# Patient Record
Sex: Female | Born: 1985 | Race: White | Hispanic: No | Marital: Married | Smoking: Former smoker
Health system: Southern US, Community
[De-identification: ages and names within clinical notes are randomized; demographics above are authoritative.]

## PROBLEM LIST (undated history)

## (undated) DIAGNOSIS — Z803 Family history of malignant neoplasm of breast: Secondary | ICD-10-CM

## (undated) DIAGNOSIS — G620 Drug-induced polyneuropathy: Secondary | ICD-10-CM

## (undated) DIAGNOSIS — F909 Attention-deficit hyperactivity disorder, unspecified type: Secondary | ICD-10-CM

## (undated) DIAGNOSIS — H04222 Epiphora due to insufficient drainage, left lacrimal gland: Secondary | ICD-10-CM

## (undated) DIAGNOSIS — R002 Palpitations: Secondary | ICD-10-CM

## (undated) DIAGNOSIS — Z1371 Encounter for nonprocreative screening for genetic disease carrier status: Secondary | ICD-10-CM

## (undated) DIAGNOSIS — H04552 Acquired stenosis of left nasolacrimal duct: Secondary | ICD-10-CM

## (undated) DIAGNOSIS — R112 Nausea with vomiting, unspecified: Secondary | ICD-10-CM

## (undated) DIAGNOSIS — L900 Lichen sclerosus et atrophicus: Secondary | ICD-10-CM

## (undated) DIAGNOSIS — Z9889 Other specified postprocedural states: Secondary | ICD-10-CM

## (undated) DIAGNOSIS — I493 Ventricular premature depolarization: Secondary | ICD-10-CM

## (undated) DIAGNOSIS — R87619 Unspecified abnormal cytological findings in specimens from cervix uteri: Secondary | ICD-10-CM

## (undated) DIAGNOSIS — T451X5A Adverse effect of antineoplastic and immunosuppressive drugs, initial encounter: Secondary | ICD-10-CM

## (undated) DIAGNOSIS — D6481 Anemia due to antineoplastic chemotherapy: Secondary | ICD-10-CM

## (undated) DIAGNOSIS — F419 Anxiety disorder, unspecified: Secondary | ICD-10-CM

## (undated) DIAGNOSIS — Z8041 Family history of malignant neoplasm of ovary: Secondary | ICD-10-CM

## (undated) DIAGNOSIS — K521 Toxic gastroenteritis and colitis: Secondary | ICD-10-CM

## (undated) DIAGNOSIS — M7989 Other specified soft tissue disorders: Secondary | ICD-10-CM

## (undated) DIAGNOSIS — R0600 Dyspnea, unspecified: Secondary | ICD-10-CM

## (undated) DIAGNOSIS — E782 Mixed hyperlipidemia: Secondary | ICD-10-CM

## (undated) DIAGNOSIS — I491 Atrial premature depolarization: Secondary | ICD-10-CM

## (undated) DIAGNOSIS — R000 Tachycardia, unspecified: Secondary | ICD-10-CM

## (undated) DIAGNOSIS — D72829 Elevated white blood cell count, unspecified: Secondary | ICD-10-CM

## (undated) DIAGNOSIS — A6000 Herpesviral infection of urogenital system, unspecified: Secondary | ICD-10-CM

## (undated) HISTORY — DX: Family history of malignant neoplasm of breast: Z80.3

## (undated) HISTORY — DX: Unspecified abnormal cytological findings in specimens from cervix uteri: R87.619

## (undated) HISTORY — DX: Encounter for nonprocreative screening for genetic disease carrier status: Z13.71

## (undated) HISTORY — PX: COLPOSCOPY: SHX161

## (undated) HISTORY — DX: Herpesviral infection of urogenital system, unspecified: A60.00

## (undated) HISTORY — PX: IUD REMOVAL: SHX5392

## (undated) HISTORY — DX: Family history of malignant neoplasm of ovary: Z80.41

## (undated) MED FILL — Dexamethasone Sodium Phosphate Inj 100 MG/10ML: INTRAMUSCULAR | Qty: 1 | Status: AC

---

## 2005-03-21 ENCOUNTER — Inpatient Hospital Stay (HOSPITAL_COMMUNITY): Admission: AD | Admit: 2005-03-21 | Discharge: 2005-03-21 | Payer: Self-pay | Admitting: Obstetrics & Gynecology

## 2006-12-26 ENCOUNTER — Observation Stay: Payer: Self-pay | Admitting: Obstetrics & Gynecology

## 2007-01-02 ENCOUNTER — Inpatient Hospital Stay: Payer: Self-pay

## 2007-08-29 ENCOUNTER — Emergency Department: Payer: Self-pay | Admitting: Emergency Medicine

## 2009-08-11 ENCOUNTER — Emergency Department: Payer: Self-pay | Admitting: Emergency Medicine

## 2011-04-20 ENCOUNTER — Ambulatory Visit: Payer: Self-pay | Admitting: Internal Medicine

## 2011-10-11 ENCOUNTER — Ambulatory Visit: Payer: Self-pay | Admitting: Rheumatology

## 2013-03-02 ENCOUNTER — Ambulatory Visit: Payer: Self-pay | Admitting: Internal Medicine

## 2013-12-13 DIAGNOSIS — Z1371 Encounter for nonprocreative screening for genetic disease carrier status: Secondary | ICD-10-CM

## 2013-12-13 DIAGNOSIS — Z803 Family history of malignant neoplasm of breast: Secondary | ICD-10-CM

## 2013-12-13 HISTORY — DX: Encounter for nonprocreative screening for genetic disease carrier status: Z13.71

## 2013-12-13 HISTORY — DX: Family history of malignant neoplasm of breast: Z80.3

## 2014-03-01 ENCOUNTER — Ambulatory Visit: Payer: Self-pay | Admitting: General Practice

## 2015-08-21 LAB — OB RESULTS CONSOLE RPR: RPR: NONREACTIVE

## 2015-08-21 LAB — OB RESULTS CONSOLE RUBELLA ANTIBODY, IGM: Rubella: IMMUNE

## 2015-08-21 LAB — OB RESULTS CONSOLE VARICELLA ZOSTER ANTIBODY, IGG: VARICELLA IGG: IMMUNE

## 2015-08-21 LAB — OB RESULTS CONSOLE HIV ANTIBODY (ROUTINE TESTING): HIV: NONREACTIVE

## 2015-08-21 LAB — OB RESULTS CONSOLE HEPATITIS B SURFACE ANTIGEN: Hepatitis B Surface Ag: NEGATIVE

## 2016-03-12 LAB — OB RESULTS CONSOLE GBS: STREP GROUP B AG: NEGATIVE

## 2016-03-25 ENCOUNTER — Encounter
Admission: RE | Admit: 2016-03-25 | Discharge: 2016-03-25 | Disposition: A | Discharge status: Home / Self Care | Payer: Managed Care, Other (non HMO) | Source: Ambulatory Visit | Attending: Obstetrics and Gynecology | Admitting: Obstetrics and Gynecology

## 2016-03-25 HISTORY — DX: Other specified postprocedural states: R11.2

## 2016-03-25 HISTORY — DX: Other specified postprocedural states: Z98.890

## 2016-03-25 LAB — TYPE AND SCREEN
ABO/RH(D): O POS
Antibody Screen: NEGATIVE
EXTEND SAMPLE REASON: UNDETERMINED

## 2016-03-25 LAB — CBC
HEMATOCRIT: 33 % — AB (ref 35.0–47.0)
Hemoglobin: 11.3 g/dL — ABNORMAL LOW (ref 12.0–16.0)
MCH: 27.5 pg (ref 26.0–34.0)
MCHC: 34.2 g/dL (ref 32.0–36.0)
MCV: 80.3 fL (ref 80.0–100.0)
Platelets: 251 10*3/uL (ref 150–440)
RBC: 4.12 MIL/uL (ref 3.80–5.20)
RDW: 14.7 % — AB (ref 11.5–14.5)
WBC: 11 10*3/uL (ref 3.6–11.0)

## 2016-03-25 LAB — RAPID HIV SCREEN (HIV 1/2 AB+AG)
HIV 1/2 Antibodies: NONREACTIVE
HIV-1 P24 Antigen - HIV24: NONREACTIVE

## 2016-03-25 LAB — ABO/RH: ABO/RH(D): O POS

## 2016-03-25 NOTE — Patient Instructions (Signed)
  Your procedure is scheduled on: Friday 03/26/16 Report to [facility] ([place] AT 5:30)   Remember: Instructions that are not followed completely may result in serious medical risk, up to and including death, or upon the discretion of your surgeon and anesthesiologist your surgery may need to be rescheduled.    __X__ 1. Do not eat food or drink liquids after midnight. No gum chewing or hard candies.     __X__ 2. No Alcohol for 24 hours before or after surgery.   ____ 3. Bring all medications with you on the day of surgery if instructed.    __X__ 4. Notify your doctor if there is any change in your medical condition     (cold, fever, infections).     Do not wear jewelry, make-up, hairpins, clips or nail polish.  Do not wear lotions, powders, or perfumes. You may wear deodorant.  Do not shave 48 hours prior to surgery. Men may shave face and neck.  Do not bring valuables to the hospital.    [facility] is not responsible for any belongings or valuables.               Contacts, dentures or bridgework may not be worn into surgery.  Leave your suitcase in the car. After surgery it may be brought to your room.  For patients admitted to the hospital, discharge time is determined by your                treatment team.   Patients discharged the day of surgery will not be allowed to drive home.   Please read over the following fact sheets that you were given:   Surgical Site Infection Prevention   ____ Take these medicines the morning of surgery with A SIP OF WATER:    1. NONE  2.   3.   4.  5.  6.  ____ Fleet Enema (as directed)   __X__ Use CHG Soap as directed  ____ Use inhalers on the day of surgery  ____ Stop metformin 2 days prior to surgery    ____ Take 1/2 of usual insulin dose the night before surgery and none on the morning of surgery.   ____ Stop Coumadin/Plavix/aspirin on   ____ Stop Anti-inflammatories on    ____ Stop supplements until  after surgery.    ____ Bring C-Pap to the hospital.

## 2016-03-26 ENCOUNTER — Inpatient Hospital Stay: Admit: 2016-03-26 | Payer: Self-pay | Admitting: Obstetrics and Gynecology

## 2016-03-26 ENCOUNTER — Encounter
Admission: RE | Disposition: A | Discharge status: Home / Self Care | Payer: Self-pay | Source: Ambulatory Visit | Attending: Obstetrics and Gynecology

## 2016-03-26 ENCOUNTER — Inpatient Hospital Stay: Payer: Managed Care, Other (non HMO) | Admitting: Anesthesiology

## 2016-03-26 ENCOUNTER — Encounter: Payer: Self-pay | Admitting: *Deleted

## 2016-03-26 ENCOUNTER — Inpatient Hospital Stay
Admission: RE | Admit: 2016-03-26 | Discharge: 2016-03-28 | DRG: 766 | Disposition: A | Discharge status: Home / Self Care | Payer: Managed Care, Other (non HMO) | Source: Ambulatory Visit | Attending: Obstetrics and Gynecology | Admitting: Obstetrics and Gynecology

## 2016-03-26 DIAGNOSIS — O9081 Anemia of the puerperium: Secondary | ICD-10-CM | POA: Diagnosis not present

## 2016-03-26 DIAGNOSIS — O34211 Maternal care for low transverse scar from previous cesarean delivery: Principal | ICD-10-CM | POA: Diagnosis present

## 2016-03-26 DIAGNOSIS — D500 Iron deficiency anemia secondary to blood loss (chronic): Secondary | ICD-10-CM | POA: Diagnosis not present

## 2016-03-26 DIAGNOSIS — Z3A39 39 weeks gestation of pregnancy: Secondary | ICD-10-CM

## 2016-03-26 DIAGNOSIS — Z302 Encounter for sterilization: Secondary | ICD-10-CM

## 2016-03-26 DIAGNOSIS — Z98891 History of uterine scar from previous surgery: Secondary | ICD-10-CM

## 2016-03-26 LAB — RPR: RPR Ser Ql: NONREACTIVE

## 2016-03-26 SURGERY — Surgical Case
Anesthesia: Choice

## 2016-03-26 SURGERY — Surgical Case
Anesthesia: Spinal

## 2016-03-26 MED ORDER — OXYCODONE HCL 5 MG PO TABS: 5.0000 mg | ORAL_TABLET | Freq: Once | ORAL | Status: DC | PRN

## 2016-03-26 MED ORDER — ACETAMINOPHEN 500 MG PO TABS
1000.0000 mg | ORAL_TABLET | Freq: Four times a day (QID) | ORAL | Status: AC
  Administered 2016-03-26 – 2016-03-27 (×4): 1000 mg via ORAL
  Filled 2016-03-26 (×5): qty 2

## 2016-03-26 MED ORDER — DIBUCAINE 1 % RE OINT: 1.0000 "application " | TOPICAL_OINTMENT | RECTAL | Status: DC | PRN

## 2016-03-26 MED ORDER — FERROUS SULFATE 325 (65 FE) MG PO TABS
325.0000 mg | ORAL_TABLET | Freq: Two times a day (BID) | ORAL | Status: DC
  Administered 2016-03-26 – 2016-03-27 (×3): 325 mg via ORAL
  Filled 2016-03-26 (×3): qty 1

## 2016-03-26 MED ORDER — ONDANSETRON HCL 4 MG/2ML IJ SOLN
INTRAMUSCULAR | Status: DC | PRN
  Administered 2016-03-26: 4 mg via INTRAVENOUS

## 2016-03-26 MED ORDER — CEFAZOLIN SODIUM-DEXTROSE 2-4 GM/100ML-% IV SOLN
2.0000 g | INTRAVENOUS | Status: AC
Start: 2016-03-26 — End: 2016-03-26
  Administered 2016-03-26: 2 g via INTRAVENOUS
  Filled 2016-03-26: qty 100

## 2016-03-26 MED ORDER — OXYCODONE-ACETAMINOPHEN 5-325 MG PO TABS
2.0000 | ORAL_TABLET | ORAL | Status: DC | PRN
  Filled 2016-03-26: qty 2

## 2016-03-26 MED ORDER — PRENATAL MULTIVITAMIN CH
1.0000 | ORAL_TABLET | Freq: Every day | ORAL | Status: DC
  Administered 2016-03-26 – 2016-03-27 (×2): 1 via ORAL
  Filled 2016-03-26 (×2): qty 1

## 2016-03-26 MED ORDER — COCONUT OIL OIL
1.0000 "application " | TOPICAL_OIL | Status: DC | PRN
  Filled 2016-03-26: qty 120

## 2016-03-26 MED ORDER — WITCH HAZEL-GLYCERIN EX PADS: 1.0000 "application " | MEDICATED_PAD | CUTANEOUS | Status: DC | PRN

## 2016-03-26 MED ORDER — MENTHOL 3 MG MT LOZG
1.0000 | LOZENGE | OROMUCOSAL | Status: DC | PRN
  Filled 2016-03-26: qty 9

## 2016-03-26 MED ORDER — SODIUM CHLORIDE 0.9% FLUSH: 3.0000 mL | INTRAVENOUS | Status: DC | PRN

## 2016-03-26 MED ORDER — LACTATED RINGERS IV BOLUS (SEPSIS)
1000.0000 mL | Freq: Once | INTRAVENOUS | Status: AC
  Administered 2016-03-26: 1000 mL via INTRAVENOUS

## 2016-03-26 MED ORDER — DIPHENHYDRAMINE HCL 25 MG PO CAPS: 25.0000 mg | ORAL_CAPSULE | ORAL | Status: DC | PRN

## 2016-03-26 MED ORDER — MORPHINE SULFATE (PF) 0.5 MG/ML IJ SOLN
INTRAMUSCULAR | Status: DC | PRN
  Administered 2016-03-26: .1 mg via INTRATHECAL

## 2016-03-26 MED ORDER — IBUPROFEN 600 MG PO TABS
600.0000 mg | ORAL_TABLET | Freq: Four times a day (QID) | ORAL | Status: DC
  Administered 2016-03-27 – 2016-03-28 (×4): 600 mg via ORAL
  Filled 2016-03-26 (×5): qty 1

## 2016-03-26 MED ORDER — NALBUPHINE HCL 10 MG/ML IJ SOLN: 5.0000 mg | Freq: Once | INTRAMUSCULAR | Status: DC | PRN

## 2016-03-26 MED ORDER — OXYTOCIN 40 UNITS IN LACTATED RINGERS INFUSION - SIMPLE MED
INTRAVENOUS | Status: DC | PRN
  Administered 2016-03-26 (×2): 500 mL via INTRAVENOUS

## 2016-03-26 MED ORDER — DIPHENHYDRAMINE HCL 25 MG PO CAPS: 25.0000 mg | ORAL_CAPSULE | Freq: Four times a day (QID) | ORAL | Status: DC | PRN

## 2016-03-26 MED ORDER — DIPHENHYDRAMINE HCL 50 MG/ML IJ SOLN: 12.5000 mg | INTRAMUSCULAR | Status: DC | PRN

## 2016-03-26 MED ORDER — BUPIVACAINE HCL (PF) 0.5 % IJ SOLN: 5.0000 mL | Freq: Once | INTRAMUSCULAR | Status: DC

## 2016-03-26 MED ORDER — KETOROLAC TROMETHAMINE 30 MG/ML IJ SOLN: 30.0000 mg | Freq: Four times a day (QID) | INTRAMUSCULAR | Status: AC | PRN

## 2016-03-26 MED ORDER — CITRIC ACID-SODIUM CITRATE 334-500 MG/5ML PO SOLN
30.0000 mL | ORAL | Status: AC
  Administered 2016-03-26: 30 mL via ORAL
  Filled 2016-03-26: qty 30

## 2016-03-26 MED ORDER — ONDANSETRON HCL 4 MG/2ML IJ SOLN: 4.0000 mg | Freq: Three times a day (TID) | INTRAMUSCULAR | Status: DC | PRN

## 2016-03-26 MED ORDER — NALOXONE HCL 0.4 MG/ML IJ SOLN: 0.4000 mg | INTRAMUSCULAR | Status: DC | PRN

## 2016-03-26 MED ORDER — OXYCODONE HCL 5 MG/5ML PO SOLN: 5.0000 mg | Freq: Once | ORAL | Status: DC | PRN

## 2016-03-26 MED ORDER — BUPIVACAINE 0.25 % ON-Q PUMP DUAL CATH 400 ML
400.0000 mL | INJECTION | Status: DC
  Filled 2016-03-26: qty 400

## 2016-03-26 MED ORDER — BUPIVACAINE HCL (PF) 0.5 % IJ SOLN
5.0000 mL | Freq: Once | INTRAMUSCULAR | Status: DC
  Filled 2016-03-26: qty 30

## 2016-03-26 MED ORDER — LACTATED RINGERS IV SOLN
INTRAVENOUS | Status: DC
  Administered 2016-03-26 – 2016-03-27 (×2): via INTRAVENOUS

## 2016-03-26 MED ORDER — BUPIVACAINE HCL (PF) 0.5 % IJ SOLN
INTRAMUSCULAR | Status: DC | PRN
  Administered 2016-03-26: 10 mL

## 2016-03-26 MED ORDER — FENTANYL CITRATE (PF) 100 MCG/2ML IJ SOLN: 25.0000 ug | INTRAMUSCULAR | Status: DC | PRN

## 2016-03-26 MED ORDER — NALBUPHINE HCL 10 MG/ML IJ SOLN: 5.0000 mg | INTRAMUSCULAR | Status: DC | PRN

## 2016-03-26 MED ORDER — SENNOSIDES-DOCUSATE SODIUM 8.6-50 MG PO TABS
2.0000 | ORAL_TABLET | ORAL | Status: DC
  Administered 2016-03-27: 2 via ORAL
  Filled 2016-03-26: qty 2

## 2016-03-26 MED ORDER — LACTATED RINGERS IV SOLN
INTRAVENOUS | Status: DC
  Administered 2016-03-26 (×2): via INTRAVENOUS

## 2016-03-26 MED ORDER — BUPIVACAINE ON-Q PAIN PUMP (FOR ORDER SET NO CHG)
INJECTION | Status: DC
  Filled 2016-03-26: qty 1

## 2016-03-26 MED ORDER — KETOROLAC TROMETHAMINE 30 MG/ML IJ SOLN
30.0000 mg | Freq: Four times a day (QID) | INTRAMUSCULAR | Status: AC | PRN
  Administered 2016-03-26 – 2016-03-27 (×3): 30 mg via INTRAVENOUS
  Filled 2016-03-26 (×3): qty 1

## 2016-03-26 MED ORDER — OXYCODONE-ACETAMINOPHEN 5-325 MG PO TABS
1.0000 | ORAL_TABLET | ORAL | Status: DC | PRN
  Administered 2016-03-27 – 2016-03-28 (×2): 1 via ORAL
  Filled 2016-03-26: qty 1

## 2016-03-26 MED ORDER — NALOXONE HCL 2 MG/2ML IJ SOSY
1.0000 ug/kg/h | PREFILLED_SYRINGE | INTRAVENOUS | Status: DC | PRN
  Filled 2016-03-26: qty 2

## 2016-03-26 MED ORDER — FENTANYL CITRATE (PF) 100 MCG/2ML IJ SOLN
INTRAMUSCULAR | Status: DC | PRN
  Administered 2016-03-26: 15 ug via INTRATHECAL

## 2016-03-26 MED ORDER — OXYTOCIN 10 UNIT/ML IJ SOLN
2.5000 [IU]/h | INTRAMUSCULAR | Status: AC
  Filled 2016-03-26: qty 4

## 2016-03-26 MED ORDER — SIMETHICONE 80 MG PO CHEW
80.0000 mg | CHEWABLE_TABLET | Freq: Three times a day (TID) | ORAL | Status: DC
  Administered 2016-03-26 – 2016-03-27 (×4): 80 mg via ORAL
  Filled 2016-03-26 (×4): qty 1

## 2016-03-26 MED ORDER — BUPIVACAINE IN DEXTROSE 0.75-8.25 % IT SOLN
INTRATHECAL | Status: DC | PRN
  Administered 2016-03-26: 1.8 mL via INTRATHECAL

## 2016-03-26 SURGICAL SUPPLY — 36 items
BARRIER ADHS 3X4 INTERCEED (GAUZE/BANDAGES/DRESSINGS) ×2 IMPLANT
BRR ADH 4X3 ABS CNTRL BYND (GAUZE/BANDAGES/DRESSINGS) ×1
CANISTER SUCT 3000ML (MISCELLANEOUS) ×3 IMPLANT
CATH KIT ON-Q SILVERSOAK 5 (CATHETERS) ×2 IMPLANT
CATH KIT ON-Q SILVERSOAK 5IN (CATHETERS) ×6 IMPLANT
CHLORAPREP W/TINT 26ML (MISCELLANEOUS) ×6 IMPLANT
CLOSURE WOUND 1/2 X4 (GAUZE/BANDAGES/DRESSINGS) ×1
CUP MEDICINE 2OZ PLAST GRAD ST (MISCELLANEOUS) ×3 IMPLANT
DRSG TELFA 3X8 NADH (GAUZE/BANDAGES/DRESSINGS) ×3 IMPLANT
ELECT REM PT RETURN 9FT ADLT (ELECTROSURGICAL) ×3
ELECTRODE REM PT RTRN 9FT ADLT (ELECTROSURGICAL) ×1 IMPLANT
GAUZE SPONGE 4X4 12PLY STRL (GAUZE/BANDAGES/DRESSINGS) ×3 IMPLANT
GLOVE BIO SURGEON STRL SZ8 (GLOVE) ×9 IMPLANT
GLOVE BIOGEL PI IND STRL 7.5 (GLOVE) ×1 IMPLANT
GLOVE BIOGEL PI INDICATOR 7.5 (GLOVE) ×8
GLOVE SKINSENSE NS SZ7.0 (GLOVE) ×2
GLOVE SKINSENSE STRL SZ7.0 (GLOVE) ×1 IMPLANT
GOWN STRL REUS W/ TWL LRG LVL3 (GOWN DISPOSABLE) ×2 IMPLANT
GOWN STRL REUS W/TWL LRG LVL3 (GOWN DISPOSABLE) ×6
LIQUID BAND (GAUZE/BANDAGES/DRESSINGS) ×3 IMPLANT
NDL SAFETY 22GX1.5 (NEEDLE) ×3 IMPLANT
NS IRRIG 1000ML POUR BTL (IV SOLUTION) ×3 IMPLANT
PACK C SECTION AR (MISCELLANEOUS) ×3 IMPLANT
PAD DRESSING TELFA 3X8 NADH (GAUZE/BANDAGES/DRESSINGS) ×1 IMPLANT
PAD OB MATERNITY 4.3X12.25 (PERSONAL CARE ITEMS) ×6 IMPLANT
PAD PREP 24X41 OB/GYN DISP (PERSONAL CARE ITEMS) ×3 IMPLANT
STRIP CLOSURE SKIN 1/2X4 (GAUZE/BANDAGES/DRESSINGS) ×2 IMPLANT
SUT 2-0 PL GUT LIGAPAK (SUTURE) ×47 IMPLANT
SUT MAXON ABS #0 GS21 30IN (SUTURE) ×2 IMPLANT
SUT MNCRL AB 4-0 PS2 18 (SUTURE) ×3 IMPLANT
SUT VIC AB 0 CT1 36 (SUTURE) ×12 IMPLANT
SUT VIC AB 1 CT1 36 (SUTURE) ×1 IMPLANT
SUT VIC AB 3-0 SH 27 (SUTURE) ×3
SUT VIC AB 3-0 SH 27X BRD (SUTURE) IMPLANT
SWABSTK COMLB BENZOIN TINCTURE (MISCELLANEOUS) ×3 IMPLANT
SYRINGE 10CC LL (SYRINGE) ×3 IMPLANT

## 2016-03-26 NOTE — Anesthesia Preprocedure Evaluation (Signed)
Anesthesia Evaluation  Patient identified by MRN, date of birth, ID band Patient awake    Reviewed: Allergy & Precautions, H&P , NPO status , Patient's Chart, lab work & pertinent test results  History of Anesthesia Complications (+) PONV and history of anesthetic complications  Airway Mallampati: II  TM Distance: >3 FB Neck ROM: full    Dental no notable dental hx.    Pulmonary neg pulmonary ROS, neg shortness of breath,    Pulmonary exam normal breath sounds clear to auscultation       Cardiovascular Exercise Tolerance: Good (-) hypertension(-) angina(-) Past MI and (-) DOE negative cardio ROS Normal cardiovascular exam Rhythm:regular Rate:Normal     Neuro/Psych negative neurological ROS  negative psych ROS   GI/Hepatic negative GI ROS, Neg liver ROS, neg GERD  ,  Endo/Other  negative endocrine ROS  Renal/GU negative Renal ROS  negative genitourinary   Musculoskeletal   Abdominal   Peds  Hematology negative hematology ROS (+)   Anesthesia Other Findings Past Medical History:   PONV (postoperative nausea and vomiting)                       Comment:nausea  Past Surgical History:   CESAREAN SECTION                                              COLPOSCOPY                                                    IUD REMOVAL                                                  BMI    Body Mass Index   32.72 kg/m 2      Reproductive/Obstetrics (+) Pregnancy                             Anesthesia Physical Anesthesia Plan  ASA: II  Anesthesia Plan: Spinal   Post-op Pain Management:    Induction:   Airway Management Planned:   Additional Equipment:   Intra-op Plan:   Post-operative Plan:   Informed Consent: I have reviewed the patients History and Physical, chart, labs and discussed the procedure including the risks, benefits and alternatives for the proposed anesthesia with the  patient or authorized representative who has indicated his/her understanding and acceptance.   Dental Advisory Given  Plan Discussed with: Anesthesiologist, CRNA and Surgeon  Anesthesia Plan Comments:         Anesthesia Quick Evaluation

## 2016-03-26 NOTE — Transfer of Care (Signed)
Immediate Anesthesia Transfer of Care Note  Patient: Cristina Baker  Procedure(s) Performed: Procedure(s): CESAREAN SECTION WITH BILATERAL TUBAL LIGATION (N/A)  Patient Location: PACU and Mother/Baby  Anesthesia Type:Spinal  Level of Consciousness: awake, alert  and oriented  Airway & Oxygen Therapy: Patient Spontanous Breathing, on room air  Post-op Assessment: Report given to RN and Post -op Vital signs reviewed and stable  Post vital signs: Reviewed and stable  Last Vitals:  Filed Vitals:   03/26/16 0627 03/26/16 0936  BP: 121/80 95/81  Pulse: 94 82  Temp:    Resp:  15    Complications: No apparent anesthesia complications

## 2016-03-26 NOTE — Discharge Summary (Signed)
OB Discharge Summary  Patient Name: Cristina Baker DOB: [birth year] MRN: [id number]  Date of admission: 03/26/2016 Delivering MD: Prentice Docker, MD Date of Delivery: 03/26/2016  Date of discharge: 03/28/16  Admitting diagnosis: prior c-section,desire for permanent sterility Intrauterine pregnancy: [redacted]w[redacted]d     Secondary diagnosis: None     Discharge diagnosis: Term Pregnancy Delivered                                                                                                Post partum procedures:none  Augmentation: n/a  Complications: None  Hospital course:  Sceduled C/S   30 y.o. yo [id number] at [redacted]w[redacted]d was admitted to the hospital 03/26/2016 for scheduled cesarean section with the following indication:Elective Repeat.  Membrane Rupture Time/Date:   ,    Patient delivered a Viable infant.03/26/2016  Details of operation can be found in separate operative note.  Pateint had an uncomplicated postpartum course.  She is ambulating, tolerating a regular diet, passing flatus, and urinating well. Patient is discharged home in stable condition on  03/26/2016          Physical exam  Filed Vitals:   03/28/16 0349 03/28/16 0821  BP:  92/77  Pulse:  85  Temp: 98.4 F (36.9 C) 98.6 F (37 C)  Resp:  18   General: alert, cooperative and no distress Lochia: appropriate Uterine Fundus: firm Incision: Healing well with no significant drainage, No significant erythema, Dressing is clean, dry, and intact DVT Evaluation: No evidence of DVT seen on physical exam. No cords or calf tenderness. No significant calf/ankle edema.  Labs: Lab Results  Component Value Date   WBC 11.0 03/25/2016   HGB 11.3* 03/25/2016   HCT 33.0* 03/25/2016   MCV 80.3 03/25/2016   PLT 251 03/25/2016   PP Hct: 28.9    Discharge instruction:   Call office if you have any of the following: headache, visual changes, fever >100 F, chills, breast concerns, excessive vaginal bleeding, incision drainage or  problems, leg pain or redness, depression or any other concerns.   Activity: Do not lift > 10 lbs for 6 weeks.  No intercourse or tampons for 6 weeks.  No driving for 1-2 weeks.    Medications:    Medication List    STOP taking these medications        valACYclovir 1000 MG tablet  Commonly known as:  VALTREX      TAKE these medications        ferrous sulfate 325 (65 FE) MG tablet  Take 1 tablet (325 mg total) by mouth daily.     ibuprofen 600 MG tablet  Commonly known as:  ADVIL,MOTRIN  Take 1 tablet (600 mg total) by mouth every 6 (six) hours.     oxyCODONE 5 MG immediate release tablet  Commonly known as:  Oxy IR/ROXICODONE  Take 1 tablet (5 mg total) by mouth once as needed (for pain score of 1-4).     prenatal multivitamin Tabs tablet  Take 1 tablet by mouth daily.         Diet: routine diet  Activity: Advance as  tolerated. Pelvic rest for 6 weeks.   Outpatient follow up: Follow-up Information    Go to Will Bonnet, MD.   Specialty:  Obstetrics and Gynecology   Why:  as scheduled for post-op check   Contact information:   [address] [place] [address] [phone]      Postpartum contraception: Tubal Ligation Rhogam Given postpartum: no Rubella vaccine given postpartum: N/A Varicella vaccine given postpartum: N/A TDaP given antepartum or postpartum: AP   Newborn Data: Live born female  Birth Weight: 7 lb 2.3 oz (3240 g) APGAR: 8, 10   Baby Feeding: Bottle  Disposition:home with mother  SIGNED: Sharlyn Bologna, CNM

## 2016-03-26 NOTE — Anesthesia Procedure Notes (Signed)
Spinal Patient location during procedure: OR Start time: 03/26/2016 7:40 AM End time: 03/26/2016 7:58 AM Staffing Anesthesiologist: Katy Fitch K Performed by: anesthesiologist and resident/CRNA  Preanesthetic Checklist Completed: patient identified, site marked, surgical consent, pre-op evaluation, timeout performed, IV checked, risks and benefits discussed and monitors and equipment checked Spinal Block Patient position: sitting Prep: Betadine Patient monitoring: heart rate, continuous pulse ox, blood pressure and cardiac monitor Approach: midline Location: L4-5 Injection technique: single-shot Needle Needle type: Whitacre and Introducer  Needle gauge: 24 G Needle length: 9 cm Assessment Sensory level: T5. Additional Notes First attempts by CRNA.  Then MD.  Spaces seemed closed and deep which may have been 2/2 habitus and positioning.  MD placed CSE, loss at 5 cm, catheter 8 cm at the skin. Negative paresthesia. Negative blood return. Positive free-flowing CSF. Expiration date of kit checked and confirmed. Patient tolerated procedure well, without complications.

## 2016-03-26 NOTE — Op Note (Signed)
Cesarean Section Procedure Note   Cristina Baker   03/26/2016   Pre-operative Diagnosis:  1) intrauterine pregnancy at [redacted]w[redacted]d  2) prior c-section 3) desire for permanent sterility.   Post-operative Diagnosis:  1) intrauterine pregnancy at [redacted]w[redacted]d  2) prior c-section 3) desire for permanent sterility.   Procedure:  1) Repeat low transverse cesarean section with Pfannenstiel incision with double-layer uterine closure 2) bilateral tubal ligation via Pomeroy method  Surgeon: Surgeon(s) and Role:    * Will Bonnet, MD - Primary   Assistants: Larey Days, MD  Anesthesia: spinal   Findings:  1) normal appearing gravid uterus, fallopian tubes, and ovaries 2) viable female infant with APGARs 8 at 1 minute and 10 at 5 minute   Estimated Blood Loss: 750 mL  Total IV Fluids: 2,000 ml   Urine Output: 125 mL clear urine at the end of the case  Specimens: portion of right and left fallopian tubes  Complications: no complications  Disposition: PACU - hemodynamically stable.   Maternal Condition: stable   Baby condition / location:  Couplet care / Skin to Skin  Procedure Details:  The patient was seen in the Holding Room. The risks, benefits, complications, treatment options, and expected outcomes were discussed with the patient. The patient concurred with the proposed plan, giving informed consent. identified as Cristina Baker and the procedure verified as C-Section Delivery. A Time Out was held and the above information confirmed.   After induction of anesthesia, the patient was draped and prepped in the usual sterile manner. A Pfannenstiel incision was made and carried down through the subcutaneous tissue to the fascia. Fascial incision was made and extended transversely. The fascia was separated from the underlying rectus tissue superiorly and inferiorly. The peritoneum was identified and entered. Peritoneal incision was extended longitudinally. The bladder flap was bluntly  freed from the lower uterine segment. A low transverse uterine incision was made and the hysterotomy was extended with cranial-caudal tension. Delivered from cephalic presentation was a 3,240 gram Living newborn infant(s) or Female with Apgar scores of 8 at one minute and 10 at five minutes. Cord ph was not sent the umbilical cord was clamped and cut cord blood was obtained for evaluation. The placenta was removed Intact and appeared normal. The uterine outline, tubes and ovaries appeared normal. The uterine incision was closed with running locked sutures of 0 Vicryl.  A second layer of the same suture was thrown in an imbricating fashion.  Hemostasis was assured.  The uterus was retained to the abdomen and the paracolic gutters were cleared of all clots and debris.   Seprafilm was placed as an adhesion barrier.  The rectus muscles were inspected and found to be hemostatic.  The On-Q catheter pumps were inserted in accordance with the manufacturer's recommendations.  The catheters were inserted approximately 4cm cephelad to the incision line, approximately 1cm apart, straddling the midline.  They were inserted to a depth of the 4th mark. They were positioned superficial to the rectus abdominus muscles and deep to the rectus fascia.    The fascia was then reapproximated with running sutures of 1-0 PDS, looped.  Three interrupted 3-0 Vicryl sutures were thrown in the subcutaneous layer to reduce tension on the skin closure. The subcuticular closure was performed using 4-0 monocryl. The skin closure was reinforced using surgical skin glue.  The On-Q catheters were bolused with 5 mL of 0.5% marcaine plain for a total of 10 mL.  The catheters were affixed to the  skin with surgical skin glue, steri-strips, and tegaderm.    Instrument, sponge, and needle counts were correct prior the abdominal closure and were correct at the conclusion of the case.  The patient received Ancef 2 gram IV prior to skin incision  (within 30 minutes). For VTE prophylaxis she was wearing SCDs throughout the case.   Signed: Will Bonnet, MD 03/26/2016 9:23 AM

## 2016-03-26 NOTE — H&P (Signed)
History and Physical Interval Note:  Cristina Baker  has presented today for surgery, with the diagnosis of prior c-section,desire for permanent sterility  The various methods of treatment have been discussed with the patient and family. After consideration of risks, benefits and other options for treatment, the patient has consented to  Procedure(s): [place] (N/A) as a surgical intervention .  The patient's history has been reviewed, patient examined, no change in status, stable for surgery.  I have reviewed the patient's chart and labs.  Questions were answered to the patient's satisfaction.    Will Bonnet, MD 03/26/2016 7:33 AM

## 2016-03-27 LAB — CBC
HEMATOCRIT: 28.9 % — AB (ref 35.0–47.0)
Hemoglobin: 9.6 g/dL — ABNORMAL LOW (ref 12.0–16.0)
MCH: 27.1 pg (ref 26.0–34.0)
MCHC: 33.4 g/dL (ref 32.0–36.0)
MCV: 81.1 fL (ref 80.0–100.0)
Platelets: 197 10*3/uL (ref 150–440)
RBC: 3.56 MIL/uL — ABNORMAL LOW (ref 3.80–5.20)
RDW: 14.9 % — AB (ref 11.5–14.5)
WBC: 11.8 10*3/uL — AB (ref 3.6–11.0)

## 2016-03-27 NOTE — Progress Notes (Signed)
  Post-operative Day 1  Subjective: no complaints, up ad lib, voiding, tolerating PO and + flatus  Objective: Blood pressure 114/60, pulse 87, temperature 98.2 F (36.8 C), temperature source Oral, resp. rate 20, height 5\' 7"  (1.702 m), weight 209 lb (94.802 kg), last menstrual period 06/27/2015, SpO2 98 %  Physical Exam:  General: alert and cooperative Lochia: appropriate Uterine Fundus: firm Incision: healing well DVT Evaluation: No evidence of DVT seen on physical exam. Abdomen: soft, NT   Recent Labs  03/25/16 1137 03/27/16 0512  HGB 11.3* 9.6*  HCT 33.0* 28.9*    Assessment POD #2, asymptomatic blood-loss anemia  Plan: Continue PO care, Advance activity as tolerated and Fe replacement, anemia precautions  Feeding: bottle Contraception: S/p BTL Blood Type: O+ RI/VI TDAP UTD    Burlene Arnt, [place] 03/27/2016, 12:11 PM

## 2016-03-27 NOTE — Anesthesia Postprocedure Evaluation (Signed)
Anesthesia Post Note  Patient: Cristina Baker  Procedure(s) Performed: Procedure(s) (LRB): CESAREAN SECTION WITH BILATERAL TUBAL LIGATION (N/A)  Patient location during evaluation: Mother Baby Anesthesia Type: Spinal Level of consciousness: awake and alert Pain management: pain level controlled Vital Signs Assessment: post-procedure vital signs reviewed and stable Respiratory status: spontaneous breathing, nonlabored ventilation and respiratory function stable Cardiovascular status: stable Postop Assessment: no headache, no backache and spinal receding Anesthetic complications: no    Last Vitals:  Filed Vitals:   03/27/16 0208 03/27/16 0429  BP: 117/77 115/60  Pulse: 88 86  Temp: 36.5 C 36.9 C  Resp: 20 20    Last Pain:  Filed Vitals:   03/27/16 0429  PainSc: 2                  Precious Haws Collie Kittel

## 2016-03-27 NOTE — Anesthesia Post-op Follow-up Note (Signed)
  Anesthesia Pain Follow-up Note  Patient: Cristina Baker  Day #: 1  Date of Follow-up: 03/27/2016 Time: 7:21 AM  Last Vitals:  Filed Vitals:   03/27/16 0208 03/27/16 0429  BP: 117/77 115/60  Pulse: 88 86  Temp: 36.5 C 36.9 C  Resp: 20 20    Level of Consciousness: alert  Pain: mild   Side Effects:None  Catheter Site Exam:clean  Plan: D/C from anesthesia care  [place]

## 2016-03-28 MED ORDER — IBUPROFEN 600 MG PO TABS: 600.0000 mg | ORAL_TABLET | Freq: Four times a day (QID) | ORAL | Status: DC

## 2016-03-28 MED ORDER — OXYCODONE HCL 5 MG PO TABS: 5.0000 mg | ORAL_TABLET | Freq: Once | ORAL | Status: DC | PRN

## 2016-03-28 MED ORDER — FERROUS SULFATE 325 (65 FE) MG PO TABS: 325.0000 mg | ORAL_TABLET | Freq: Every day | ORAL | Status: DC

## 2016-03-28 NOTE — Discharge Instructions (Signed)
Discharge instructions:  ° °Call office if you have any of the following: headache, visual changes, fever >100 F, chills, breast concerns, excessive vaginal bleeding, incision drainage or problems, leg pain or redness, depression or any other concerns.  ° °Activity: Do not lift > 10 lbs for 6 weeks.  °No intercourse or tampons for 6 weeks.  °No driving for 1-2 weeks.  ° °

## 2016-03-28 NOTE — Progress Notes (Signed)
Patient understands all discharge instructions and the need to make follow up appointments. Patient discharge via wheelchair with nursing.

## 2016-03-29 LAB — SURGICAL PATHOLOGY

## 2017-03-14 ENCOUNTER — Other Ambulatory Visit: Payer: Self-pay | Admitting: Obstetrics and Gynecology

## 2017-03-14 NOTE — Telephone Encounter (Signed)
Please advise 

## 2017-05-26 ENCOUNTER — Encounter: Payer: Self-pay | Admitting: Obstetrics and Gynecology

## 2017-05-26 ENCOUNTER — Ambulatory Visit (INDEPENDENT_AMBULATORY_CARE_PROVIDER_SITE_OTHER): Payer: Managed Care, Other (non HMO) | Admitting: Obstetrics and Gynecology

## 2017-05-26 VITALS — BP 118/72 | Ht 64.0 in | Wt 174.0 lb

## 2017-05-26 DIAGNOSIS — Z01419 Encounter for gynecological examination (general) (routine) without abnormal findings: Secondary | ICD-10-CM | POA: Diagnosis not present

## 2017-05-26 NOTE — Progress Notes (Addendum)
Gynecology Annual Exam  PCP: Jodi Marble, MD  Chief Complaint  Patient presents with  . Annual Exam    History of Present Illness:  Ms. Cristina Baker is a 31 y.o. [phone] who LMP was Patient's last menstrual period was 05/19/2017., presents today for her annual examination.  Her menses are regular every 28-30 days, lasting 4 day(s).  Dysmenorrhea none. She does not have intermenstrual bleeding.  She is single partner, contraception - tubal ligation.  Last Pap: 08/18/16, NILM- HPV negative Hx of STDs: HSV  Last mammogram: n/a There is FH of breast cancer in her paternal aunts (two of them). There is no FH of ovarian cancer. The patient does not do self-breast exams.  Tobacco use: former smoker. Alcohol use: social drinker Exercise: moderately active  She does get adequate calcium and Vitamin D in her diet.  The patient wears seatbelts: yes.      Past Medical History:  Diagnosis Date  . Abnormal Pap smear of cervix   . Herpes genitalis   . PONV (postoperative nausea and vomiting)    nausea    Past Surgical History:  Procedure Laterality Date  . CESAREAN SECTION    . CESAREAN SECTION WITH BILATERAL TUBAL LIGATION N/A 03/26/2016   Procedure: CESAREAN SECTION WITH BILATERAL TUBAL LIGATION;  Surgeon: Will Bonnet, MD;  Location: [facility] ORS;  Service: Obstetrics;  Laterality: N/A;  . COLPOSCOPY    . IUD REMOVAL      Prior to Admission medications   Medication Sig Start Date End Date Taking? Authorizing Provider  amphetamine-dextroamphetamine (ADDERALL XR) 20 MG 24 hr capsule Take 20 mg by mouth daily.   Yes [provider]  valACYclovir (VALTREX) 1000 MG tablet TAKE 1 TABLET (1,000 MG) BY ORAL ROUTE ONCE DAILY Patient not taking: Reported on 05/26/2017 03/15/17   Malachy Mood, MD    No Known Allergies  Gynecologic History: Patient's last menstrual period was 05/19/2017.  Obstetric History: [id number]  Social History   Social History  . Marital  status: Married    Spouse name: N/A  . Number of children: N/A  . Years of education: N/A   Occupational History  . Not on file.   Social History Main Topics  . Smoking status: Former Research scientist (life sciences)  . Smokeless tobacco: Never Used  . Alcohol use No  . Drug use: No  . Sexual activity: Yes    Birth control/ protection: Surgical     Comment: tubal ligation   Other Topics Concern  . Not on file   Social History Narrative  . No narrative on file    Family History  Problem Relation Age of Onset  . Hypercholesterolemia Mother   . Diabetes Mellitus II Father   . Hypertension Father   . Throat cancer Paternal Grandfather   . Heart attack Paternal Grandfather   . Breast cancer Paternal Aunt     Review of Systems  Constitutional: Negative.   HENT: Negative.   Eyes: Negative.   Respiratory: Negative.   Cardiovascular: Negative.   Gastrointestinal: Negative.   Genitourinary: Negative.   Musculoskeletal: Negative.   Skin: Negative.   Neurological: Negative.   Psychiatric/Behavioral: Negative.      Physical Exam BP 118/72   Ht 5\' 4"  (1.626 m)   Wt 174 lb (78.9 kg)   LMP 05/19/2017   BMI 29.87 kg/m    Physical Exam  Constitutional: She is oriented to person, place, and time. She appears well-developed and well-nourished. No distress.  Genitourinary: Vagina normal  and uterus normal. Pelvic exam was performed with patient supine. There is no rash, tenderness, lesion or injury on the right labia. There is no rash, tenderness, lesion or injury on the left labia. Right adnexum does not display mass, does not display tenderness and does not display fullness. Left adnexum does not display mass, does not display tenderness and does not display fullness. Cervix does not exhibit motion tenderness, lesion or polyp.   Uterus is mobile and anteverted. Uterus is not enlarged, tender, exhibiting a mass or irregular (is regular).  HENT:  Head: Normocephalic and atraumatic.  Eyes: EOM are  normal. No scleral icterus.  Neck: Normal range of motion. Neck supple. No thyromegaly present.  Cardiovascular: Normal rate and regular rhythm.  Exam reveals no gallop and no friction rub.   No murmur heard. Pulmonary/Chest: Effort normal and breath sounds normal. No respiratory distress. She has no wheezes. She has no rales.  Abdominal: Soft. Bowel sounds are normal. She exhibits no distension and no mass. There is no tenderness. There is no rebound and no guarding.  Musculoskeletal: Normal range of motion. She exhibits no edema.  Lymphadenopathy:    She has no cervical adenopathy.  Neurological: She is alert and oriented to person, place, and time. No cranial nerve deficit.  Skin: Skin is warm and dry. No erythema.  Psychiatric: She has a normal mood and affect. Her behavior is normal. Judgment normal.    Female chaperone present for pelvic and breast  portions of the physical exam  Results: AUDIT Questionnaire (screen for alcoholism): 2 PHQ-9: 1   Assessment: 31 y.o. [id number] female here for routine annual gynecologic examination.  Plan: Problem List Items Addressed This Visit    None      Screening: -- Blood pressure screen normal -- Colonoscopy - not due -- Mammogram - not due -- Weight screening: overweight: continue to monitor -- Depression screening negative (PHQ-9) -- Nutrition: normal -- cholesterol screening: not due for screening -- osteoporosis screening: not due -- tobacco screening: not using -- alcohol screening: AUDIT questionnaire indicates low-risk usage. -- family history of breast cancer screening: done. Previously tested and was negative. -- no evidence of domestic violence or intimate partner violence. -- STD screening: gonorrhea/chlamydia NAAT not collected per patient request. -- pap smear not collected per ASCCP guidelines  Prentice Docker, MD 05/26/2017 8:16 AM

## 2018-02-09 ENCOUNTER — Encounter: Payer: Self-pay | Admitting: Maternal Newborn

## 2018-02-09 ENCOUNTER — Ambulatory Visit (INDEPENDENT_AMBULATORY_CARE_PROVIDER_SITE_OTHER): Payer: Managed Care, Other (non HMO) | Admitting: Maternal Newborn

## 2018-02-09 VITALS — BP 130/80 | HR 102 | Temp 98.2°F | Ht 66.0 in | Wt 179.0 lb

## 2018-02-09 DIAGNOSIS — R350 Frequency of micturition: Secondary | ICD-10-CM

## 2018-02-09 LAB — POCT URINALYSIS DIPSTICK
BILIRUBIN UA: NEGATIVE
GLUCOSE UA: NEGATIVE
Ketones, UA: NEGATIVE
Leukocytes, UA: NEGATIVE
Nitrite, UA: NEGATIVE
RBC UA: NEGATIVE
Urobilinogen, UA: NEGATIVE E.U./dL — AB
pH, UA: 5 (ref 5.0–8.0)

## 2018-02-09 NOTE — Progress Notes (Signed)
rine   Obstetrics & Gynecology Office Visit   Chief Complaint:  Chief Complaint  Patient presents with  . Urinary Tract Infection    frequency, ? something bacterial she and hsb are passing back and forth    History of Present Illness: Patient's husband has had urethral discharge and frequent urination/pressure since early January and was  treated with antibiotics with relief of symptoms at the end of that month. He has negative chlamydia and gonorrhea results and they are mutually monogamous. He is now having symptoms again and his provider suggested that patient and he may be "sharing bacteria." Of note, she has not had intercourse with her spouse since his symptoms began. She thinks she may have noticed some increased frequency of urination in herself. Denies dysuria, CVAT, urgency, discharge and difference in urine color/odor. UA is negative today.   Review of Systems: 10 point review of systems negative unless otherwise noted in HPI.  Past Medical History:  Past Medical History:  Diagnosis Date  . Abnormal Pap smear of cervix   . Herpes genitalis   . PONV (postoperative nausea and vomiting)    nausea    Past Surgical History:  Past Surgical History:  Procedure Laterality Date  . CESAREAN SECTION    . CESAREAN SECTION WITH BILATERAL TUBAL LIGATION N/A 03/26/2016   Procedure: CESAREAN SECTION WITH BILATERAL TUBAL LIGATION;  Surgeon: Will Bonnet, MD;  Location: [facility] ORS;  Service: Obstetrics;  Laterality: N/A;  . COLPOSCOPY    . IUD REMOVAL      Gynecologic History: Patient's last menstrual period was 01/22/2018.  Obstetric History: [id number]  Family History:  Family History  Problem Relation Age of Onset  . Hypercholesterolemia Mother   . Diabetes Mellitus II Father   . Hypertension Father   . Throat cancer Paternal Grandfather   . Heart attack Paternal Grandfather   . Breast cancer Paternal Aunt     Social History:  Social History   Socioeconomic History    . Marital status: Married    Spouse name: Not on file  . Number of children: Not on file  . Years of education: Not on file  . Highest education level: Not on file  Social Needs  . Financial resource strain: Not on file  . Food insecurity - worry: Not on file  . Food insecurity - inability: Not on file  . Transportation needs - medical: Not on file  . Transportation needs - non-medical: Not on file  Occupational History  . Not on file  Tobacco Use  . Smoking status: Former Research scientist (life sciences)  . Smokeless tobacco: Never Used  Substance and Sexual Activity  . Alcohol use: No  . Drug use: No  . Sexual activity: Yes    Birth control/protection: Surgical    Comment: tubal ligation  Other Topics Concern  . Not on file  Social History Narrative  . Not on file    Allergies:  No Known Allergies  Medications: Prior to Admission medications   Medication Sig Start Date End Date Taking? Authorizing Provider  amphetamine-dextroamphetamine (ADDERALL XR) 20 MG 24 hr capsule Take 20 mg by mouth daily.    [provider]  valACYclovir (VALTREX) 1000 MG tablet TAKE 1 TABLET (1,000 MG) BY ORAL ROUTE ONCE DAILY Patient not taking: Reported on 05/26/2017 03/15/17   Malachy Mood, MD    Physical Exam Vitals:  Vitals:   02/09/18 0814  BP: 130/80  Pulse: (!) 102  Temp: 98.2 F (36.8 C)   Patient's  last menstrual period was 01/22/2018.  General: NAD HEENT: normocephalic, anicteric Genitourinary: CVAT negative Neurologic: Grossly intact Psychiatric: mood appropriate, affect full  Assessment: 32 y.o. [id number] with possible increased urinary frequency.  Plan: Problem List Items Addressed This Visit    None    Visit Diagnoses    Increased urinary frequency    -  Primary   Relevant Orders   POCT urinalysis dipstick (Completed)   Urine Culture     Sending urine culture due to patient's concerns about asymptomatic bacterial infection. She does not desire STD testing or any other  cultures at this time as she has no other symptoms besides urinary frequency. Will inform patient of results when available.  A total of 10 minutes were spent in face-to-face contact with the patient during this encounter with over half of that time devoted to counseling and coordination of care.  Avel Sensor, CNM 02/09/2018  1:29 PM

## 2018-02-11 LAB — URINE CULTURE

## 2018-06-02 ENCOUNTER — Encounter: Payer: Self-pay | Admitting: Obstetrics and Gynecology

## 2018-06-02 ENCOUNTER — Ambulatory Visit (INDEPENDENT_AMBULATORY_CARE_PROVIDER_SITE_OTHER): Payer: Managed Care, Other (non HMO) | Admitting: Obstetrics and Gynecology

## 2018-06-02 VITALS — BP 116/74 | HR 108 | Ht 66.0 in | Wt 181.0 lb

## 2018-06-02 DIAGNOSIS — Z0000 Encounter for general adult medical examination without abnormal findings: Secondary | ICD-10-CM | POA: Diagnosis not present

## 2018-06-02 DIAGNOSIS — Z01419 Encounter for gynecological examination (general) (routine) without abnormal findings: Secondary | ICD-10-CM | POA: Diagnosis not present

## 2018-06-02 DIAGNOSIS — Z1339 Encounter for screening examination for other mental health and behavioral disorders: Secondary | ICD-10-CM | POA: Diagnosis not present

## 2018-06-02 DIAGNOSIS — Z1331 Encounter for screening for depression: Secondary | ICD-10-CM

## 2018-06-02 NOTE — Progress Notes (Signed)
Gynecology Annual Exam  PCP: Jodi Marble, MD  Chief Complaint  Patient presents with  . Gynecologic Exam   History of Present Illness:  Ms. Cristina Baker is a 32 y.o. [phone] who LMP was Patient's last menstrual period was 05/09/2018 (exact date)., presents today for her annual examination.  Her menses are regular every 28-30 days, lasting 4 day(s).  Dysmenorrhea none. She does not have intermenstrual bleeding.  She is single partner, contraception - tubal ligation.  Last Pap: 08/18/16  Results were: no abnormalities /neg HPV DNA negative Hx of STDs: HSV  Last mammogram: n/a  There is  FH of breast cancer in 2 paternal aunts. There is no FH of ovarian cancer. The patient does not do self-breast exams.  Tobacco use: former smoker. Alcohol use: social drinker Exercise: moderately active  The patient wears seatbelts: yes.      Past Medical History:  Diagnosis Date  . Abnormal Pap smear of cervix   . Herpes genitalis   . PONV (postoperative nausea and vomiting)    nausea    Past Surgical History:  Procedure Laterality Date  . CESAREAN SECTION    . CESAREAN SECTION WITH BILATERAL TUBAL LIGATION N/A 03/26/2016   Procedure: CESAREAN SECTION WITH BILATERAL TUBAL LIGATION;  Surgeon: Will Bonnet, MD;  Location: [facility];  Service: Obstetrics;  Laterality: N/A;  . COLPOSCOPY    . IUD REMOVAL      Prior to Admission medications   Medication Sig Start Date End Date Taking? Authorizing Provider  amphetamine-dextroamphetamine (ADDERALL XR) 20 MG 24 hr capsule Take 20 mg by mouth daily.   Yes [provider]  valACYclovir (VALTREX) 1000 MG tablet TAKE 1 TABLET (1,000 MG) BY ORAL ROUTE ONCE DAILY 03/15/17  Yes Malachy Mood, MD   Allergies: No Known Allergies  Gynecologic History: Patient's last menstrual period was 05/09/2018 (exact date).  Obstetric History: [id number]  Social History   Socioeconomic History  . Marital status: Married    Spouse name:  Not on file  . Number of children: Not on file  . Years of education: Not on file  . Highest education level: Not on file  Occupational History  . Not on file  Social Needs  . Financial resource strain: Not on file  . Food insecurity:    Worry: Not on file    Inability: Not on file  . Transportation needs:    Medical: Not on file    Non-medical: Not on file  Tobacco Use  . Smoking status: Former Research scientist (life sciences)  . Smokeless tobacco: Never Used  Substance and Sexual Activity  . Alcohol use: No  . Drug use: No  . Sexual activity: Yes    Birth control/protection: Surgical    Comment: tubal ligation  Lifestyle  . Physical activity:    Days per week: 2 days    Minutes per session: Not on file  . Stress: Not on file  Relationships  . Social connections:    Talks on phone: Not on file    Gets together: Not on file    Attends religious service: Not on file    Active member of club or organization: Not on file    Attends meetings of clubs or organizations: Not on file    Relationship status: Not on file  . Intimate partner violence:    Fear of current or ex partner: Not on file    Emotionally abused: Not on file    Physically abused: Not on  file    Forced sexual activity: Not on file  Other Topics Concern  . Not on file  Social History Narrative  . Not on file    Family History  Problem Relation Age of Onset  . Hypercholesterolemia Mother   . Diabetes Mellitus II Father   . Hypertension Father   . Throat cancer Paternal Grandfather   . Heart attack Paternal Grandfather   . Breast cancer Paternal Aunt     Review of Systems  Constitutional: Negative.   HENT: Negative.   Eyes: Negative.   Respiratory: Negative.   Cardiovascular: Negative.   Gastrointestinal: Negative.   Genitourinary: Negative.   Musculoskeletal: Negative.   Skin: Negative.   Neurological: Negative.   Psychiatric/Behavioral: Negative.      Physical Exam BP 116/74 (BP Location: Left Arm, Patient  Position: Sitting, Cuff Size: Large)   Pulse (!) 108   Ht 5\' 6"  (1.676 m)   Wt 181 lb (82.1 kg)   LMP 05/09/2018 (Exact Date)   SpO2 98%   BMI 29.21 kg/m    Physical Exam  Constitutional: She is oriented to person, place, and time. She appears well-developed and well-nourished. No distress.  Genitourinary: Uterus normal. Pelvic exam was performed with patient supine. There is no rash, tenderness, lesion or injury on the right labia. There is no rash, tenderness, lesion or injury on the left labia. No erythema, tenderness or bleeding in the vagina. No signs of injury around the vagina. No vaginal discharge found. Right adnexum does not display mass, does not display tenderness and does not display fullness. Left adnexum does not display mass, does not display tenderness and does not display fullness. Cervix does not exhibit motion tenderness, lesion, discharge or polyp.   Uterus is mobile and anteverted. Uterus is not enlarged, tender or exhibiting a mass.  HENT:  Head: Normocephalic and atraumatic.  Eyes: EOM are normal. No scleral icterus.  Neck: Normal range of motion. Neck supple. No thyromegaly present.  Cardiovascular: Normal rate and regular rhythm. Exam reveals no gallop and no friction rub.  No murmur heard. Pulmonary/Chest: Effort normal and breath sounds normal. No respiratory distress. She has no wheezes. She has no rales. Right breast exhibits no inverted nipple, no mass, no nipple discharge, no skin change and no tenderness. Left breast exhibits no inverted nipple, no mass, no nipple discharge, no skin change and no tenderness.  Abdominal: Soft. Bowel sounds are normal. She exhibits no distension and no mass. There is no tenderness. There is no rebound and no guarding.  Musculoskeletal: Normal range of motion. She exhibits no edema or tenderness.  Lymphadenopathy:    She has no cervical adenopathy.       Right: No inguinal adenopathy present.       Left: No inguinal adenopathy  present.  Neurological: She is alert and oriented to person, place, and time. No cranial nerve deficit.  Skin: Skin is warm and dry. No rash noted. No erythema.  Psychiatric: She has a normal mood and affect. Her behavior is normal. Judgment normal.    Female chaperone present for pelvic and breast  portions of the physical exam  Results: AUDIT Questionnaire (screen for alcoholism): 3 PHQ-9: 1   Assessment: 32 y.o. [id number] female here for routine annual gynecologic examination.  Plan: Problem List Items Addressed This Visit    None      Screening: -- Blood pressure screen normal -- Colonoscopy - not due -- Mammogram - not due -- Weight screening: overweight: continue to  monitor -- Depression screening negative (PHQ-9) -- Nutrition: normal -- cholesterol screening: not due for screening -- osteoporosis screening: not due -- tobacco screening: not using -- alcohol screening: AUDIT questionnaire indicates low-risk usage. -- family history of breast cancer screening: done. not at high risk. -- no evidence of domestic violence or intimate partner violence. -- STD screening: gonorrhea/chlamydia NAAT not collected per patient request. -- pap smear not collected per ASCCP guidelines -- HPV vaccination series: not eligilbe  Prentice Docker, MD 06/02/2018 8:23 AM

## 2018-06-08 ENCOUNTER — Encounter: Payer: Self-pay | Admitting: Obstetrics and Gynecology

## 2019-03-14 ENCOUNTER — Other Ambulatory Visit: Payer: Self-pay | Admitting: Internal Medicine

## 2019-03-14 DIAGNOSIS — R109 Unspecified abdominal pain: Secondary | ICD-10-CM

## 2019-03-19 ENCOUNTER — Other Ambulatory Visit: Payer: Self-pay

## 2019-03-19 ENCOUNTER — Ambulatory Visit
Admission: RE | Admit: 2019-03-19 | Discharge: 2019-03-19 | Disposition: A | Discharge status: Home / Self Care | Payer: Managed Care, Other (non HMO) | Source: Ambulatory Visit | Attending: Internal Medicine | Admitting: Internal Medicine

## 2019-03-19 DIAGNOSIS — R109 Unspecified abdominal pain: Secondary | ICD-10-CM

## 2019-03-20 ENCOUNTER — Other Ambulatory Visit: Payer: Self-pay | Admitting: Internal Medicine

## 2019-03-20 DIAGNOSIS — R109 Unspecified abdominal pain: Secondary | ICD-10-CM

## 2019-03-21 ENCOUNTER — Other Ambulatory Visit: Payer: Self-pay

## 2019-03-21 ENCOUNTER — Other Ambulatory Visit: Payer: Self-pay | Admitting: Internal Medicine

## 2019-03-21 ENCOUNTER — Ambulatory Visit
Admission: RE | Admit: 2019-03-21 | Discharge: 2019-03-21 | Disposition: A | Discharge status: Home / Self Care | Payer: Managed Care, Other (non HMO) | Source: Ambulatory Visit | Attending: Internal Medicine | Admitting: Internal Medicine

## 2019-03-21 DIAGNOSIS — R109 Unspecified abdominal pain: Secondary | ICD-10-CM

## 2019-03-21 MED ORDER — GADOBUTROL 1 MMOL/ML IV SOLN
7.5000 mL | Freq: Once | INTRAVENOUS | Status: AC | PRN
  Administered 2019-03-21: 7.5 mL via INTRAVENOUS

## 2019-05-29 ENCOUNTER — Encounter: Payer: Self-pay | Admitting: Obstetrics and Gynecology

## 2019-05-29 ENCOUNTER — Other Ambulatory Visit: Payer: Self-pay

## 2019-05-29 ENCOUNTER — Other Ambulatory Visit (HOSPITAL_COMMUNITY)
Admission: RE | Admit: 2019-05-29 | Discharge: 2019-05-29 | Disposition: A | Discharge status: Home / Self Care | Payer: Managed Care, Other (non HMO) | Source: Ambulatory Visit | Attending: Obstetrics and Gynecology | Admitting: Obstetrics and Gynecology

## 2019-05-29 ENCOUNTER — Ambulatory Visit (INDEPENDENT_AMBULATORY_CARE_PROVIDER_SITE_OTHER): Payer: Managed Care, Other (non HMO) | Admitting: Obstetrics and Gynecology

## 2019-05-29 VITALS — BP 124/78 | Ht 67.0 in | Wt 180.0 lb

## 2019-05-29 DIAGNOSIS — L290 Pruritus ani: Secondary | ICD-10-CM

## 2019-05-29 DIAGNOSIS — Z124 Encounter for screening for malignant neoplasm of cervix: Secondary | ICD-10-CM

## 2019-05-29 DIAGNOSIS — Z1331 Encounter for screening for depression: Secondary | ICD-10-CM

## 2019-05-29 DIAGNOSIS — Z01419 Encounter for gynecological examination (general) (routine) without abnormal findings: Secondary | ICD-10-CM

## 2019-05-29 DIAGNOSIS — Z1339 Encounter for screening examination for other mental health and behavioral disorders: Secondary | ICD-10-CM

## 2019-05-29 LAB — HM PAP SMEAR: HM Pap smear: NORMAL

## 2019-05-29 NOTE — Progress Notes (Signed)
Gynecology Annual Exam  PCP: Jodi Marble, MD  Chief Complaint  Patient presents with  . Annual Exam    History of Present Illness:  Ms. Cristina Baker is a 33 y.o. [phone] who LMP was Patient's last menstrual period was 05/15/2019., presents today for her annual examination.  Her menses are a little irregular.  She can't predict when they come.  They come at least one moth apart. Sometimes it's a little longer.  The period lasts about 3-4 days.  Three heavy days.  She doesn't pass clots. Denies menorrhagia.  No intermenstrual spotting.   She is single partner, contraception - tubal ligation.  Last Pap: 2017  Results were: no abnormalities /neg HPV DNA negative Hx of STDs: HSV  There is a FH of breast cancer in 2 paternal aunts. There is no FH of ovarian cancer. The patient does do self-breast exams.  She has had BRCA testing and it was negative.  Her aunt did have testing, too, and it was negative.   Tobacco use: former smoker. Alcohol use: social drinker Exercise: moderately active  The patient wears seatbelts: yes.   The patient reports that domestic violence in her life is absent.   She states that she is more sensitive in her skin and vaginal area. Shortly after her menses she has an increase in discharge.  Some soaps or even after shaving she has more irritation. This is probably true in the last 6 months or so.  She uses a more sensitive Dove soap.  She doesn't take baths. She does not have an odor. She has no rash.    Past Medical History:  Diagnosis Date  . Abnormal Pap smear of cervix   . BRCA negative   . Family history of breast cancer   . Family history of ovarian cancer   . Herpes genitalis   . PONV (postoperative nausea and vomiting)    nausea    Past Surgical History:  Procedure Laterality Date  . CESAREAN SECTION    . CESAREAN SECTION WITH BILATERAL TUBAL LIGATION N/A 03/26/2016   Procedure: CESAREAN SECTION WITH BILATERAL TUBAL LIGATION;  Surgeon:  Will Bonnet, MD;  Location: [facility];  Service: Obstetrics;  Laterality: N/A;  . COLPOSCOPY    . IUD REMOVAL      Prior to Admission medications   Medication Sig Start Date End Date Taking? Authorizing Provider  amphetamine-dextroamphetamine (ADDERALL XR) 20 MG 24 hr capsule Take 20 mg by mouth daily.   Yes [provider]  valACYclovir (VALTREX) 1000 MG tablet TAKE 1 TABLET (1,000 MG) BY ORAL ROUTE ONCE DAILY Patient not taking: Reported on 05/29/2019 03/15/17   Malachy Mood, MD   Allergies: No Known Allergies  Obstetric History: [id number]  Social History   Socioeconomic History  . Marital status: Married    Spouse name: Not on file  . Number of children: Not on file  . Years of education: Not on file  . Highest education level: Not on file  Occupational History  . Not on file  Social Needs  . Financial resource strain: Not on file  . Food insecurity    Worry: Not on file    Inability: Not on file  . Transportation needs    Medical: Not on file    Non-medical: Not on file  Tobacco Use  . Smoking status: Former Research scientist (life sciences)  . Smokeless tobacco: Never Used  Substance and Sexual Activity  . Alcohol use: No  . Drug use: No  .  Sexual activity: Yes    Birth control/protection: Surgical    Comment: tubal ligation  Lifestyle  . Physical activity    Days per week: 2 days    Minutes per session: Not on file  . Stress: Not on file  Relationships  . Social Herbalist on phone: Not on file    Gets together: Not on file    Attends religious service: Not on file    Active member of club or organization: Not on file    Attends meetings of clubs or organizations: Not on file    Relationship status: Not on file  . Intimate partner violence    Fear of current or ex partner: Not on file    Emotionally abused: Not on file    Physically abused: Not on file    Forced sexual activity: Not on file  Other Topics Concern  . Not on file  Social History Narrative   . Not on file    Family History  Problem Relation Age of Onset  . Hypercholesterolemia Mother   . Diabetes Mellitus II Father   . Hypertension Father   . Throat cancer Paternal Grandfather   . Heart attack Paternal Grandfather   . Breast cancer Paternal Aunt     Review of Systems  Constitutional: Negative.   HENT: Negative.   Eyes: Negative.   Respiratory: Negative.   Cardiovascular: Negative.   Gastrointestinal: Negative.   Genitourinary: Negative.   Musculoskeletal: Negative.   Skin: Negative.   Neurological: Negative.   Psychiatric/Behavioral: Negative.      Physical Exam BP 124/78   Ht _0  (1.702 m)   Wt 180 lb (81.6 kg)   LMP 05/15/2019   BMI 28.19 kg/m    Physical Exam Constitutional:      General: She is not in acute distress.    Appearance: Normal appearance. She is well-developed.  Genitourinary:     Pelvic exam was performed with patient supine.     Vulva, urethra, bladder and uterus normal.     No inguinal adenopathy present in the right or left side.       No signs of injury in the vagina.     No vaginal discharge, erythema, tenderness or bleeding.     No cervical motion tenderness, discharge, lesion or polyp.     Uterus is mobile.     Uterus is not enlarged or tender.     No uterine mass detected.    Uterus is anteverted.     No right or left adnexal mass present.     Right adnexa not tender or full.     Left adnexa not tender or full.  HENT:     Head: Normocephalic and atraumatic.  Eyes:     General: No scleral icterus.    Conjunctiva/sclera: Conjunctivae normal.  Neck:     Musculoskeletal: Normal range of motion and neck supple.     Thyroid: No thyromegaly.  Cardiovascular:     Rate and Rhythm: Normal rate and regular rhythm.     Heart sounds: No murmur. No friction rub. No gallop.   Pulmonary:     Effort: Pulmonary effort is normal. No respiratory distress.     Breath sounds: Normal breath sounds. No wheezing or rales.  Chest:      Breasts:        Right: No inverted nipple, mass, nipple discharge, skin change or tenderness.        Left: No inverted nipple,  mass, nipple discharge, skin change or tenderness.  Abdominal:     General: Bowel sounds are normal. There is no distension.     Palpations: Abdomen is soft. There is no mass.     Tenderness: There is no abdominal tenderness. There is no guarding or rebound.  Musculoskeletal: Normal range of motion.        General: No swelling or tenderness.  Lymphadenopathy:     Cervical: No cervical adenopathy.     Lower Body: No right inguinal adenopathy. No left inguinal adenopathy.  Neurological:     General: No focal deficit present.     Mental Status: She is alert and oriented to person, place, and time.     Cranial Nerves: No cranial nerve deficit.  Skin:    General: Skin is warm and dry.     Findings: No erythema or rash.  Psychiatric:        Mood and Affect: Mood normal.        Behavior: Behavior normal.        Judgment: Judgment normal.     Female chaperone present for pelvic and breast  portions of the physical exam  Results: AUDIT Questionnaire (screen for alcoholism): 2 PHQ-9: 1   Assessment: 33 y.o. [phone] female here for routine annual gynecologic examination  Plan: Problem List Items Addressed This Visit    None    Visit Diagnoses    Women's annual routine gynecological examination    -  Primary   Relevant Orders   Cytology - PAP   Screening for depression       Screening for alcoholism       Pap smear for cervical cancer screening       Relevant Orders   Cytology - PAP   Anal itching          Screening: -- Blood pressure screen normal -- Weight screening: overweight: continue to monitor -- Depression screening negative (PHQ-9) -- Nutrition: normal -- cholesterol screening: not due for screening -- osteoporosis screening: not due -- tobacco screening: not using -- alcohol screening: AUDIT questionnaire indicates low-risk usage.  -- family history of breast cancer screening: done. not at high risk. -- no evidence of domestic violence or intimate partner violence. -- STD screening: gonorrhea/chlamydia NAAT not collected per patient request. -- pap smear collected per ASCCP guidelines  Anal itching: for now will treat with conservative measures. Recommend a barrier topical emollient be placed on the area around her anus. If this does not help, will consider a short course of a topic steroid.   Prentice Docker, MD 05/30/2019 5:13 PM

## 2019-05-30 ENCOUNTER — Encounter: Payer: Self-pay | Admitting: Obstetrics and Gynecology

## 2019-05-31 LAB — CYTOLOGY - PAP
Diagnosis: NEGATIVE
HPV: NOT DETECTED

## 2020-06-02 ENCOUNTER — Ambulatory Visit (INDEPENDENT_AMBULATORY_CARE_PROVIDER_SITE_OTHER): Payer: Managed Care, Other (non HMO) | Admitting: Obstetrics and Gynecology

## 2020-06-02 ENCOUNTER — Encounter: Payer: Self-pay | Admitting: Obstetrics and Gynecology

## 2020-06-02 ENCOUNTER — Other Ambulatory Visit: Payer: Self-pay

## 2020-06-02 VITALS — BP 118/74 | Ht 66.0 in | Wt 179.0 lb

## 2020-06-02 DIAGNOSIS — Z1339 Encounter for screening examination for other mental health and behavioral disorders: Secondary | ICD-10-CM | POA: Diagnosis not present

## 2020-06-02 DIAGNOSIS — L900 Lichen sclerosus et atrophicus: Secondary | ICD-10-CM | POA: Diagnosis not present

## 2020-06-02 DIAGNOSIS — Z01419 Encounter for gynecological examination (general) (routine) without abnormal findings: Secondary | ICD-10-CM | POA: Diagnosis not present

## 2020-06-02 DIAGNOSIS — Z1331 Encounter for screening for depression: Secondary | ICD-10-CM | POA: Diagnosis not present

## 2020-06-02 MED ORDER — CLOBETASOL PROPIONATE 0.05 % EX OINT: TOPICAL_OINTMENT | CUTANEOUS | 0 refills | Status: DC

## 2020-06-02 NOTE — Progress Notes (Signed)
Gynecology Annual Exam  PCP: Jodi Marble, MD  Chief Complaint  Patient presents with  . Annual Exam   History of Present Illness:  Ms. Cristina Baker is a 34 y.o. [phone] who LMP was Patient's last menstrual period was 05/12/2020., presents today for her annual examination.  Her menses have changed a little bit. Within the last 4-5 months her periods are starting later. Heavy flow is first 2 days, third day is lighter. The bleeding stops, then comes back for about a day.     She is sexually active, contraception - tubal ligation.  Last Pap: 1 year  Results were: no abnormalities /neg HPV DNA negative Hx of STDs: HSV  There is a FH of breast cancer in 2 paternal aunts. There is no FH of ovarian cancer. The patient does do self-breast exams. She has had BRCA testing and it was negative. Her aunt had testing and it was negative.   Tobacco use: former smoker  Alcohol use: social drinker Exercise: she tries and now that it is warmer.    The patient wears seatbelts: yes.   The patient reports that domestic violence in her life is absent.   She reports continued issues with perianal itching. She notes irritation in her vulvar area. She has tried [employer], [employer], baby oil gel.  These things seem to help for a little while. When she isn't using them, the symptoms return.    Past Medical History:  Diagnosis Date  . Abnormal Pap smear of cervix   . BRCA negative   . Family history of breast cancer   . Family history of ovarian cancer   . Herpes genitalis   . PONV (postoperative nausea and vomiting)    nausea    Past Surgical History:  Procedure Laterality Date  . CESAREAN SECTION    . CESAREAN SECTION WITH BILATERAL TUBAL LIGATION N/A 03/26/2016   Procedure: CESAREAN SECTION WITH BILATERAL TUBAL LIGATION;  Surgeon: Will Bonnet, MD;  Location: [facility] ORS;  Service: Obstetrics;  Laterality: N/A;  . COLPOSCOPY    . IUD REMOVAL      Prior to Admission medications   Medication  Sig Start Date End Date Taking? Authorizing Provider  amphetamine-dextroamphetamine (ADDERALL XR) 20 MG 24 hr capsule Take 20 mg by mouth daily.   Yes [provider]  valACYclovir (VALTREX) 1000 MG tablet TAKE 1 TABLET (1,000 MG) BY ORAL ROUTE ONCE DAILY Patient not taking: Reported on 05/29/2019 03/15/17   Malachy Mood, MD   Allergies: No Known Allergies  Obstetric History: [id number]  Social History   Socioeconomic History  . Marital status: Married    Spouse name: Not on file  . Number of children: Not on file  . Years of education: Not on file  . Highest education level: Not on file  Occupational History  . Not on file  Tobacco Use  . Smoking status: Former Research scientist (life sciences)  . Smokeless tobacco: Never Used  Vaping Use  . Vaping Use: Never used  Substance and Sexual Activity  . Alcohol use: No  . Drug use: No  . Sexual activity: Yes    Birth control/protection: Surgical    Comment: tubal ligation  Other Topics Concern  . Not on file  Social History Narrative  . Not on file   Social Determinants of Health   Financial Resource Strain:   . Difficulty of Paying Living Expenses:   Food Insecurity:   . Worried About Charity fundraiser in the Last Year:   .  Ran Out of Food in the Last Year:   Transportation Needs:   . Film/video editor (Medical):   Marland Kitchen Lack of Transportation (Non-Medical):   Physical Activity:   . Days of Exercise per Week:   . Minutes of Exercise per Session:   Stress:   . Feeling of Stress :   Social Connections:   . Frequency of Communication with Friends and Family:   . Frequency of Social Gatherings with Friends and Family:   . Attends Religious Services:   . Active Member of Clubs or Organizations:   . Attends Archivist Meetings:   Marland Kitchen Marital Status:   Intimate Partner Violence:   . Fear of Current or Ex-Partner:   . Emotionally Abused:   Marland Kitchen Physically Abused:   . Sexually Abused:     Family History  Problem Relation Age  of Onset  . Hypercholesterolemia Mother   . Diabetes Mellitus II Father   . Hypertension Father   . Throat cancer Paternal Grandfather   . Heart attack Paternal Grandfather   . Breast cancer Paternal Aunt     Review of Systems  Constitutional: Negative.   HENT: Negative.   Eyes: Negative.   Respiratory: Negative.   Cardiovascular: Negative.   Gastrointestinal: Negative.   Genitourinary: Negative.   Musculoskeletal: Negative.   Skin: Positive for rash.  Neurological: Negative.   Psychiatric/Behavioral: Negative.      Physical Exam BP 118/74   Ht '5\' 6"'  (1.676 m)   Wt 179 lb (81.2 kg)   LMP 05/12/2020   BMI 28.89 kg/m   Physical Exam Constitutional:      General: She is not in acute distress.    Appearance: Normal appearance. She is well-developed.  Genitourinary:     Pelvic exam was performed with patient in the lithotomy position.     Vulva, urethra, bladder and uterus normal.     No inguinal adenopathy present in the right or left side.       No signs of injury in the vagina.     No vaginal discharge, erythema, tenderness or bleeding.     No cervical motion tenderness, discharge, lesion or polyp.     Uterus is mobile.     Uterus is not enlarged or tender.     No uterine mass detected.    Uterus is anteverted.     No right or left adnexal mass present.     Right adnexa not tender or full.     Left adnexa not tender or full.  HENT:     Head: Normocephalic and atraumatic.  Eyes:     General: No scleral icterus.    Conjunctiva/sclera: Conjunctivae normal.  Neck:     Thyroid: No thyromegaly.  Cardiovascular:     Rate and Rhythm: Normal rate and regular rhythm.     Heart sounds: No murmur heard.  No friction rub. No gallop.   Pulmonary:     Effort: Pulmonary effort is normal. No respiratory distress.     Breath sounds: Normal breath sounds. No wheezing or rales.  Chest:     Breasts:        Right: No inverted nipple, mass, nipple discharge, skin change or  tenderness.        Left: No inverted nipple, mass, nipple discharge, skin change or tenderness.  Abdominal:     General: Bowel sounds are normal. There is no distension.     Palpations: Abdomen is soft. There is no mass.  Tenderness: There is no abdominal tenderness. There is no guarding or rebound.  Musculoskeletal:        General: No swelling or tenderness. Normal range of motion.     Cervical back: Normal range of motion and neck supple.  Lymphadenopathy:     Cervical: No cervical adenopathy.     Lower Body: No right inguinal adenopathy. No left inguinal adenopathy.  Neurological:     General: No focal deficit present.     Mental Status: She is alert and oriented to person, place, and time.     Cranial Nerves: No cranial nerve deficit.  Skin:    General: Skin is warm and dry.     Findings: No erythema or rash.  Psychiatric:        Mood and Affect: Mood normal.        Behavior: Behavior normal.        Judgment: Judgment normal.    Female chaperone present for pelvic and breast  portions of the physical exam  Results: AUDIT Questionnaire (screen for alcoholism): 1 PHQ-9: 2  Assessment: 34 y.o. [phone] female here for routine annual gynecologic examination  Plan: Problem List Items Addressed This Visit      Musculoskeletal and Integument   Lichen sclerosus   Relevant Medications   clobetasol ointment (TEMOVATE) 0.05 %    Other Visit Diagnoses    Women's annual routine gynecological examination    -  Primary   Screening for depression       Screening for alcoholism         Screening: -- Blood pressure screen normal -- Weight screening: overweight: continue to monitor -- Depression screening negative (PHQ-9) -- Nutrition: normal -- cholesterol screening: not due for screening -- osteoporosis screening: not due -- tobacco screening: not using -- alcohol screening: AUDIT questionnaire indicates low-risk usage. -- family history of breast cancer screening: done.  not at high risk. -- no evidence of domestic violence or intimate partner violence. -- STD screening: gonorrhea/chlamydia NAAT not collected per patient request. -- pap smear not collected per ASCCP guidelines.   Lichen Sclerosus:  Will start topical steroid.  Follow up in 2 months to assess response.  Recommend she see dermatologist for other possible lesions. Discussed small association with squamous cell skin carcinoma.    Prentice Docker, MD 06/02/2020 9:42 AM

## 2020-06-02 NOTE — Patient Instructions (Signed)
Lichen Sclerosus Lichen sclerosus is a skin problem. It can happen on any part of the body, but it commonly involves the anal or genital areas. It can cause itching and discomfort in these areas. Treatment can help to control symptoms. When the genital area is affected, getting treatment is important because the condition can cause scarring that may lead to other problems. What are the causes? The cause of this condition is not known. It may be related to an overactive immune system or a lack of certain hormones. Lichen sclerosus is not an infection or a fungus, and it is not passed from one person to another (not contagious). What increases the risk? This condition is more likely to develop in women, usually after menopause. What are the signs or symptoms? Symptoms of this condition include:  Thin, wrinkled, white areas on the skin.  Thickened white areas on the skin.  Red and swollen patches (lesions) on the skin.  Tears or cracks in the skin.  Bruising.  Blood blisters.  Severe itching.  Pain, itching, or burning when urinating. Constipation is also common in people with lichen sclerosus. How is this diagnosed? This condition may be diagnosed with a physical exam. In some cases, a tissue sample (biopsy sample) may be removed to be looked at under a microscope. How is this treated? This condition is usually treated with medicated creams or ointments (topical steroids) that are applied over the affected areas. In some cases, treatment may also include medicines that are taken by mouth. Surgery may be needed in more severe cases that are causing problems such as scarring. Follow these instructions at home:  Take or use over-the-counter and prescription medicines only as told by your health care provider.  Use creams or ointments as told by your health care provider.  Do not scratch the affected areas of skin.  If you are a woman, be sure to keep the vaginal area as clean and dry  as possible.  Clean the affected area of skin gently with water. Avoid using rough towels or toilet paper.  Keep all follow-up visits as told by your health care provider. This is important. Contact a health care provider if:  You have increasing redness, swelling, or pain in the affected area.  You have fluid, blood, or pus coming from the affected area.  You have new lesions on your skin.  You have a fever.  You have pain during sex. Summary  Lichen sclerosus is a skin problem. When the genital area is affected, getting treatment is important because the condition can cause scarring that may lead to other problems.  This condition is usually treated with medicated creams or ointments (topical steroids) that are applied over the affected areas.  Take or use over-the-counter and prescription medicines only as told by your health care provider.  Contact a health care provider if you have new lesions on your skin, have pain during sex, or have increasing redness, swelling, or pain in the affected area.  Keep all follow-up visits as told by your health care provider. This is important. This information is not intended to replace advice given to you by your health care provider. Make sure you discuss any questions you have with your health care provider. Document Revised: 04/13/2018 Document Reviewed: 04/13/2018 Elsevier Patient Education  2020 Elsevier Inc.  

## 2020-06-04 ENCOUNTER — Encounter: Payer: Self-pay | Admitting: Obstetrics and Gynecology

## 2021-01-09 DIAGNOSIS — H04222 Epiphora due to insufficient drainage, left lacrimal gland: Secondary | ICD-10-CM | POA: Insufficient documentation

## 2021-01-09 DIAGNOSIS — H04552 Acquired stenosis of left nasolacrimal duct: Secondary | ICD-10-CM | POA: Insufficient documentation

## 2021-06-09 ENCOUNTER — Ambulatory Visit (INDEPENDENT_AMBULATORY_CARE_PROVIDER_SITE_OTHER): Payer: Managed Care, Other (non HMO) | Admitting: Obstetrics and Gynecology

## 2021-06-09 ENCOUNTER — Encounter: Payer: Self-pay | Admitting: Obstetrics and Gynecology

## 2021-06-09 ENCOUNTER — Other Ambulatory Visit: Payer: Self-pay

## 2021-06-09 VITALS — BP 130/74 | HR 98 | Ht 67.0 in | Wt 195.0 lb

## 2021-06-09 DIAGNOSIS — Z01419 Encounter for gynecological examination (general) (routine) without abnormal findings: Secondary | ICD-10-CM | POA: Diagnosis not present

## 2021-06-09 DIAGNOSIS — Z1339 Encounter for screening examination for other mental health and behavioral disorders: Secondary | ICD-10-CM

## 2021-06-09 DIAGNOSIS — L900 Lichen sclerosus et atrophicus: Secondary | ICD-10-CM | POA: Diagnosis not present

## 2021-06-09 DIAGNOSIS — Z1331 Encounter for screening for depression: Secondary | ICD-10-CM

## 2021-06-09 MED ORDER — CLOBETASOL PROPIONATE 0.05 % EX OINT: TOPICAL_OINTMENT | CUTANEOUS | 2 refills | Status: DC

## 2021-06-09 NOTE — Progress Notes (Signed)
Gynecology Annual Exam  PCP: Jodi Marble, MD  Chief Complaint  Patient presents with   Annual Exam    No complaints   History of Present Illness:  Ms. Cristina Baker is a 35 y.o. [phone] who LMP was Patient's last menstrual period was 05/29/2021 (exact date)., presents today for her annual examination.  Her menses are regular every 28-30 days, lasting 6 day(s).  Dysmenorrhea mild, occurring premenstrually. She does not have intermenstrual bleeding.  She is sexually active, contraception - tubal ligation.  Last Pap: 05/29/2019  Results were: no abnormalities /neg HPV DNA negative Hx of STDs: HSV  There is a  FH of breast cancer in 2 paternal aunts. There is no FH of ovarian cancer. The patient does do self-breast exams. She has had BRCA testing which was negative. Her aunt had testing which was negative.   Tobacco use: former smoker (10-12 years ago) Alcohol use: social drinker Exercise: is pretty active. Is talking to her PCP about weight loss. It seems like the more she tries to lose the more she gains.    She wants a refill on the cream she had last year.  The issue is either hit or miss.  She tries to use a barrier, especially when she shaves.  She doesn't go very long without a flare.  Not every time is bad.    The patient wears seatbelts: yes.   The patient reports that domestic violence in her life is absent.   Past Medical History:  Diagnosis Date   Abnormal Pap smear of cervix    BRCA negative 2015   MyRisk neg; IBIS=18/8%   Family history of breast cancer 2015   IBIS=18.8%   Family history of ovarian cancer    Herpes genitalis    PONV (postoperative nausea and vomiting)    nausea    Past Surgical History:  Procedure Laterality Date   CESAREAN SECTION     CESAREAN SECTION WITH BILATERAL TUBAL LIGATION N/A 03/26/2016   Procedure: CESAREAN SECTION WITH BILATERAL TUBAL LIGATION;  Surgeon: Will Bonnet, MD;  Location: [facility] OR[facility];  Service: Obstetrics;   Laterality: N/A;   COLPOSCOPY     IUD REMOVAL      Prior to Admission medications   Medication Sig Start Date End Date Taking? Authorizing Provider  amphetamine-dextroamphetamine (ADDERALL) 20 MG tablet Take 20 mg by mouth 2 (two) times daily. 06/08/21  Yes [provider]  clobetasol ointment (TEMOVATE) 0.05 % Apply to affected area twice daily for 2 weeks, then HS for 2 weeks and then M-W-F HS x 2 weeks 06/02/20  Yes Will Bonnet, MD  amphetamine-dextroamphetamine (ADDERALL XR) 20 MG 24 hr capsule Take 20 mg by mouth daily. Patient not taking: Reported on 06/09/2021    [provider]   Allergies: No Known Allergies  Obstetric History: [id number]  Social History   Socioeconomic History   Marital status: Married    Spouse name: Not on file   Number of children: Not on file   Years of education: Not on file   Highest education level: Not on file  Occupational History   Not on file  Tobacco Use   Smoking status: Former    Pack years: 0.00   Smokeless tobacco: Never  Vaping Use   Vaping Use: Never used  Substance and Sexual Activity   Alcohol use: No   Drug use: No   Sexual activity: Yes    Birth control/protection: Surgical    Comment: tubal ligation  Other Topics Concern   Not on file  Social History Narrative   Not on file   Social Determinants of Health   Financial Resource Strain: Not on file  Food Insecurity: Not on file  Transportation Needs: Not on file  Physical Activity: Not on file  Stress: Not on file  Social Connections: Not on file  Intimate Partner Violence: Not on file    Family History  Problem Relation Age of Onset   Hypercholesterolemia Mother    Diabetes Mellitus II Father    Hypertension Father    Throat cancer Paternal Grandfather    Heart attack Paternal Grandfather    Breast cancer Paternal Aunt     Review of Systems  Constitutional: Negative.   HENT: Negative.    Eyes: Negative.   Respiratory: Negative.     Cardiovascular: Negative.   Gastrointestinal: Negative.   Genitourinary: Negative.   Musculoskeletal: Negative.   Skin: Negative.   Neurological: Negative.   Psychiatric/Behavioral: Negative.      Physical Exam BP 130/74 (Cuff Size: Normal)   Pulse 98   Ht '5\' 7"'  (1.702 m)   Wt 195 lb (88.5 kg)   LMP 05/29/2021 (Exact Date)   BMI 30.54 kg/m    Physical Exam Constitutional:      General: She is not in acute distress.    Appearance: Normal appearance. She is well-developed.  Genitourinary:     Vulva and bladder normal.     Right Labia: skin changes.     Right Labia: No rash, tenderness, lesions or Bartholin's cyst.    Left Labia: skin changes.     Left Labia: No tenderness, lesions, Bartholin's cyst or rash.       No inguinal adenopathy present in the right or left side.    Pelvic Tanner Score: 5/5.    No vaginal discharge, erythema, tenderness or bleeding.      Right Adnexa: not tender, not full and no mass present.    Left Adnexa: not tender, not full and no mass present.    No cervical motion tenderness, discharge, lesion or polyp.     Uterus is not enlarged or tender.     No uterine mass detected.    Pelvic exam was performed with patient in the lithotomy position.  Rectum:     Rectal exam comments: Area of erythema surrounding anal area..  Breasts:    Right: No inverted nipple, mass, nipple discharge, skin change or tenderness.     Left: No inverted nipple, mass, nipple discharge, skin change or tenderness.  HENT:     Head: Normocephalic and atraumatic.  Eyes:     General: No scleral icterus.    Conjunctiva/sclera: Conjunctivae normal.  Neck:     Thyroid: No thyromegaly.  Cardiovascular:     Rate and Rhythm: Normal rate and regular rhythm.     Heart sounds: No murmur heard.   No friction rub. No gallop.  Pulmonary:     Effort: Pulmonary effort is normal. No respiratory distress.     Breath sounds: Normal breath sounds. No wheezing or rales.  Abdominal:      General: Bowel sounds are normal. There is no distension.     Palpations: Abdomen is soft. There is no mass.     Tenderness: There is no abdominal tenderness. There is no guarding or rebound.     Hernia: There is no hernia in the left inguinal area or right inguinal area.  Musculoskeletal:        General:  No swelling or tenderness. Normal range of motion.     Cervical back: Normal range of motion and neck supple.  Lymphadenopathy:     Cervical: No cervical adenopathy.     Lower Body: No right inguinal adenopathy. No left inguinal adenopathy.  Neurological:     General: No focal deficit present.     Mental Status: She is alert and oriented to person, place, and time.     Cranial Nerves: No cranial nerve deficit.  Skin:    General: Skin is warm and dry.     Findings: No erythema or rash.  Psychiatric:        Mood and Affect: Mood normal.        Behavior: Behavior normal.        Judgment: Judgment normal.    Female chaperone present for pelvic and breast  portions of the physical exam  Results: AUDIT Questionnaire (screen for alcoholism): 4 PHQ-9: 1   Assessment: 35 y.o. [phone] female here for routine annual gynecologic examination  Plan: Problem List Items Addressed This Visit       Other   Lichen sclerosus   Relevant Medications   clobetasol ointment (TEMOVATE) 0.05 %   Other Visit Diagnoses     Women's annual routine gynecological examination    -  Primary   Screening for depression       Screening for alcoholism           Screening: -- Blood pressure screen normal -- Weight screening: overweight: continue to monitor -- Depression screening negative (PHQ-9) -- Nutrition: normal -- cholesterol screening: not due for screening -- osteoporosis screening: not due -- tobacco screening: not using -- alcohol screening: AUDIT questionnaire indicates low-risk usage. -- family history of breast cancer screening: done. not at high risk. -- no evidence of  domestic violence or intimate partner violence. -- STD screening: gonorrhea/chlamydia NAAT not collected per patient request. -- pap smear not collected per ASCCP guidelines  Lichen Sclerosis:  refill clobetasol.  Encourage dermatology evaluation if no improvement. Also, discussed biopsy, if no improvement.   Prentice Docker, MD 06/09/2021 11:15 AM

## 2021-09-02 ENCOUNTER — Other Ambulatory Visit: Payer: Self-pay

## 2021-09-02 ENCOUNTER — Encounter: Payer: Self-pay | Admitting: Obstetrics and Gynecology

## 2021-09-02 ENCOUNTER — Ambulatory Visit: Payer: Managed Care, Other (non HMO) | Admitting: Obstetrics and Gynecology

## 2021-09-02 VITALS — BP 138/88 | Wt 195.0 lb

## 2021-09-02 DIAGNOSIS — N9089 Other specified noninflammatory disorders of vulva and perineum: Secondary | ICD-10-CM

## 2021-09-02 NOTE — Progress Notes (Signed)
Obstetrics & Gynecology Office Visit   Chief Complaint  Patient presents with   Follow-up    Medication f/u, starting to have some relief this week.     History of Present Illness: 35 y.o. [phone] female who presents with history of lichen sclerosis on exam. She was started again on clobetasol.  She states that the medication has not worked very well.  She states that she did perform the clobetasol taper.  She has also used Aquaphor as well as homeopathic creams.  She has used nothing for the past 4 days and has had the best results.  She notices more irritation around her anal area than anywhere.  She has not been to a dermatologist.   Past Medical History:  Diagnosis Date   Abnormal Pap smear of cervix    BRCA negative 2015   MyRisk neg; IBIS=18/8%   Family history of breast cancer 2015   IBIS=18.8%   Family history of ovarian cancer    Herpes genitalis    PONV (postoperative nausea and vomiting)    nausea    Past Surgical History:  Procedure Laterality Date   CESAREAN SECTION     CESAREAN SECTION WITH BILATERAL TUBAL LIGATION N/A 03/26/2016   Procedure: CESAREAN SECTION WITH BILATERAL TUBAL LIGATION;  Surgeon: Will Bonnet, MD;  Location: [facility] ORS;  Service: Obstetrics;  Laterality: N/A;   COLPOSCOPY     IUD REMOVAL      Gynecologic History: Patient's last menstrual period was 08/03/2021 (exact date).  Obstetric History: [id number]  Family History  Problem Relation Age of Onset   Hypercholesterolemia Mother    Diabetes Mellitus II Father    Hypertension Father    Throat cancer Paternal Grandfather    Heart attack Paternal Grandfather    Breast cancer Paternal Aunt     Social History   Socioeconomic History   Marital status: Married    Spouse name: Not on file   Number of children: Not on file   Years of education: Not on file   Highest education level: Not on file  Occupational History   Not on file  Tobacco Use   Smoking status: Former   Smokeless  tobacco: Never  Vaping Use   Vaping Use: Never used  Substance and Sexual Activity   Alcohol use: No   Drug use: No   Sexual activity: Yes    Birth control/protection: Surgical    Comment: tubal ligation  Other Topics Concern   Not on file  Social History Narrative   Not on file   Social Determinants of Health   Financial Resource Strain: Not on file  Food Insecurity: Not on file  Transportation Needs: Not on file  Physical Activity: Not on file  Stress: Not on file  Social Connections: Not on file  Intimate Partner Violence: Not on file    No Known Allergies  Prior to Admission medications   Medication Sig Start Date End Date Taking? Authorizing Provider  amphetamine-dextroamphetamine (ADDERALL XR) 20 MG 24 hr capsule Take 20 mg by mouth daily. Patient not taking: Reported on 06/09/2021    [provider]  amphetamine-dextroamphetamine (ADDERALL) 20 MG tablet Take 20 mg by mouth 2 (two) times daily. 06/08/21   [provider]  clobetasol ointment (TEMOVATE) 0.05 % Apply to affected area twice daily for 2 weeks, then HS for 2 weeks and then M-W-F HS x 2 weeks 06/09/21   Will Bonnet, MD  valACYclovir (VALTREX) 1000 MG tablet TAKE 1 TABLET (1,000  MG) BY ORAL ROUTE ONCE DAILY Patient not taking: No sig reported 03/15/17   Malachy Mood, MD    Review of Systems  Constitutional: Negative.   HENT: Negative.    Eyes: Negative.   Respiratory: Negative.    Cardiovascular: Negative.   Gastrointestinal: Negative.   Genitourinary: Negative.   Musculoskeletal: Negative.   Skin: Negative.        See HPI  Neurological: Negative.   Psychiatric/Behavioral: Negative.      Physical Exam BP 138/88   Wt 195 lb (88.5 kg)   LMP 08/03/2021 (Exact Date)   BMI 30.54 kg/m  Patient's last menstrual period was 08/03/2021 (exact date). Physical Exam Constitutional:      General: She is not in acute distress.    Appearance: Normal appearance.  Genitourinary:      Right Labia: rash and skin changes.     Right Labia: No tenderness or lesions.    Left Labia: skin changes and rash.     Left Labia: No tenderness or lesions.       No inguinal adenopathy present in the right or left side.    Pelvic Tanner Score: 5/5. HENT:     Head: Normocephalic and atraumatic.  Eyes:     General: No scleral icterus.    Conjunctiva/sclera: Conjunctivae normal.  Lymphadenopathy:     Lower Body: No right inguinal adenopathy. No left inguinal adenopathy.  Neurological:     General: No focal deficit present.     Mental Status: She is alert and oriented to person, place, and time.     Cranial Nerves: No cranial nerve deficit.  Psychiatric:        Mood and Affect: Mood normal.        Behavior: Behavior normal.        Judgment: Judgment normal.    Female chaperone present for pelvic and breast  portions of the physical exam  Assessment: 35 y.o. [id number] female here for  1. Vulvar lesion      Plan: Problem List Items Addressed This Visit   None Visit Diagnoses     Vulvar lesion    -  Primary      We had a long discussion regarding her lack of response to steroid ointment.  My next step recommendation is for her to see a dermatologist and/or have a biopsy performed with me.  Since she has not responded to the steroid, I am concerned the diagnosis of lichen sclerosis is an accurate.  She will attempt to schedule with a dermatologist or have a biopsy performed with me soon.  We discussed that there is some risk of a precancerous lesion present or cancerous in the worst case.  In that case we would want as earlier diagnosis is possible.  Therefore, the visit with dermatology or biopsy by me is extremely important.  She voiced understanding.  A total of 23 minutes were spent face-to-face with the patient as well as preparation, review, communication, and documentation during this encounter.    Prentice Docker, MD 09/02/2021 8:48 AM

## 2021-10-07 ENCOUNTER — Other Ambulatory Visit: Payer: Self-pay

## 2021-10-07 ENCOUNTER — Ambulatory Visit (INDEPENDENT_AMBULATORY_CARE_PROVIDER_SITE_OTHER): Payer: Managed Care, Other (non HMO) | Admitting: Obstetrics and Gynecology

## 2021-10-07 ENCOUNTER — Encounter: Payer: Self-pay | Admitting: Obstetrics and Gynecology

## 2021-10-07 ENCOUNTER — Other Ambulatory Visit (HOSPITAL_COMMUNITY)
Admission: RE | Admit: 2021-10-07 | Discharge: 2021-10-07 | Disposition: A | Discharge status: Home / Self Care | Payer: Managed Care, Other (non HMO) | Source: Ambulatory Visit | Attending: Obstetrics and Gynecology | Admitting: Obstetrics and Gynecology

## 2021-10-07 VITALS — BP 118/84 | Ht 67.0 in

## 2021-10-07 DIAGNOSIS — N9089 Other specified noninflammatory disorders of vulva and perineum: Secondary | ICD-10-CM | POA: Diagnosis not present

## 2021-10-07 NOTE — Progress Notes (Signed)
VULVAR BIOPSY NOTE The indications for vulvar biopsy (rule out neoplasia, establish lichen sclerosus diagnosis) were reviewed.   Risks of the biopsy including pain, bleeding, infection, inadequate specimen, scarring and need for additional procedures  were discussed. The patient stated understanding and agreed to undergo procedure today. Consent was signed,  time out performed.   The patient's vulva was prepped with Betadine. 1% lidocaine was injected into area of concern. A 3.5 mm punch biopsy was done, biopsy tissue was picked up with sterile forceps and sterile scissors were used to excise the lesion.  Small bleeding was noted and hemostasis was achieved using after a figure-of-eight stitch with 3-0 vicryl.  The patient tolerated the procedure well. Post-procedure instructions  (pelvic rest for one week) were given to the patient. The patient is to call with heavy bleeding, fever greater than 100.4, foul smelling vaginal discharge or other concerns.    Prentice Docker, MD, [facility], [facility] 10/07/2021 2:31 PM

## 2021-10-09 LAB — SURGICAL PATHOLOGY

## 2021-11-28 ENCOUNTER — Emergency Department (HOSPITAL_COMMUNITY)
Admission: EM | Admit: 2021-11-28 | Discharge: 2021-11-28 | Disposition: A | Discharge status: Home / Self Care | Payer: Managed Care, Other (non HMO) | Attending: Emergency Medicine | Admitting: Emergency Medicine

## 2021-11-28 ENCOUNTER — Other Ambulatory Visit: Payer: Self-pay

## 2021-11-28 ENCOUNTER — Encounter (HOSPITAL_COMMUNITY): Payer: Self-pay

## 2021-11-28 DIAGNOSIS — M436 Torticollis: Secondary | ICD-10-CM | POA: Insufficient documentation

## 2021-11-28 DIAGNOSIS — Z87891 Personal history of nicotine dependence: Secondary | ICD-10-CM | POA: Diagnosis not present

## 2021-11-28 DIAGNOSIS — M542 Cervicalgia: Secondary | ICD-10-CM | POA: Diagnosis present

## 2021-11-28 MED ORDER — DICLOFENAC SODIUM 75 MG PO TBEC: 75.0000 mg | DELAYED_RELEASE_TABLET | Freq: Two times a day (BID) | ORAL | 0 refills | Status: DC

## 2021-11-28 MED ORDER — METHOCARBAMOL 500 MG PO TABS: 500.0000 mg | ORAL_TABLET | Freq: Four times a day (QID) | ORAL | 0 refills | Status: DC

## 2021-11-28 NOTE — ED Triage Notes (Addendum)
PT arrived POV from home c/o a headache, and severe left sided neck pain that started yesterday. Pt states she thinks the left side of her neck is swollen and it hurts to touch. Pt did have the flu last week and went and had a steroid shot on Tuesday. Pt denies any trauma or injury.

## 2021-11-28 NOTE — Discharge Instructions (Addendum)
Return if any problems.  See your Physician if symptoms persist

## 2021-11-28 NOTE — ED Provider Notes (Signed)
[facility] EMERGENCY DEPARTMENT Provider Note   CSN: [id number] Arrival date & time: 11/28/21  1655     History No chief complaint on file.   Cristina Baker is a 35 y.o. female.  Pt reports she had the flu last week.  Pt reports pain in the left side of her neck since yesterday.  Pt reports some relief with ibuprofen.    The history is provided by the patient. No language interpreter was used.  Neck Injury This is a new problem. The current episode started 2 days ago. The problem occurs constantly. The problem has been gradually worsening. Pertinent negatives include no chest pain and no headaches. Nothing aggravates the symptoms. Nothing relieves the symptoms. She has tried nothing for the symptoms. The treatment provided no relief.      Past Medical History:  Diagnosis Date   Abnormal Pap smear of cervix    BRCA negative 2015   MyRisk neg; IBIS=18/8%   Family history of breast cancer 2015   IBIS=18.8%   Family history of ovarian cancer    Herpes genitalis    PONV (postoperative nausea and vomiting)    nausea    Patient Active Problem List   Diagnosis Date Noted   Lichen sclerosus [id number]/[id number]   Status post cesarean section 03/26/2016    Past Surgical History:  Procedure Laterality Date   CESAREAN SECTION     CESAREAN SECTION WITH BILATERAL TUBAL LIGATION N/A 03/26/2016   Procedure: CESAREAN SECTION WITH BILATERAL TUBAL LIGATION;  Surgeon: Will Bonnet, MD;  Location: [facility] ORS;  Service: Obstetrics;  Laterality: N/A;   COLPOSCOPY     IUD REMOVAL       OB History     Gravida  4   Para  3   Term  3   Preterm      AB  1   Living  3      SAB      IAB      Ectopic      Multiple  0   Live Births  3           Family History  Problem Relation Age of Onset   Hypercholesterolemia Mother    Diabetes Mellitus II Father    Hypertension Father    Throat cancer Paternal Grandfather    Heart attack Paternal Grandfather     Breast cancer Paternal Aunt     Social History   Tobacco Use   Smoking status: Former   Smokeless tobacco: Never  Scientific laboratory technician Use: Never used  Substance Use Topics   Alcohol use: No   Drug use: No    Home Medications Prior to Admission medications   Medication Sig Start Date End Date Taking? Authorizing Provider  diclofenac (VOLTAREN) 75 MG EC tablet Take 1 tablet (75 mg total) by mouth 2 (two) times daily. 11/28/21  Yes Caryl Ada K, PA-C  methocarbamol (ROBAXIN) 500 MG tablet Take 1 tablet (500 mg total) by mouth 4 (four) times daily. 11/28/21  Yes Fransico Meadow, PA-C  amphetamine-dextroamphetamine (ADDERALL XR) 20 MG 24 hr capsule Take 20 mg by mouth daily. Patient not taking: Reported on 06/09/2021    [provider]  amphetamine-dextroamphetamine (ADDERALL) 20 MG tablet Take 20 mg by mouth 2 (two) times daily. 06/08/21   [provider]  clobetasol ointment (TEMOVATE) 0.05 % Apply to affected area twice daily for 2 weeks, then HS for 2 weeks and then M-W-F HS  x 2 weeks 06/09/21   Will Bonnet, MD  valACYclovir (VALTREX) 1000 MG tablet TAKE 1 TABLET (1,000 MG) BY ORAL ROUTE ONCE DAILY Patient not taking: No sig reported 03/15/17   Malachy Mood, MD    Allergies    Patient has no known allergies.  Review of Systems   Review of Systems  Cardiovascular:  Negative for chest pain.  Neurological:  Negative for headaches.  All other systems reviewed and are negative.  Physical Exam Updated Vital Signs BP (!) 151/104 (BP Location: Left Arm)    Pulse (!) 114    Temp 98.2 F (36.8 C) (Oral)    Resp 19    Ht _0  (1.702 m)    Wt 86.2 kg    SpO2 100%    BMI 29.76 kg/m   Physical Exam Vitals and nursing note reviewed.  Constitutional:      Appearance: She is well-developed.  HENT:     Head: Normocephalic.     Right Ear: Tympanic membrane normal.     Left Ear: Tympanic membrane normal.     Nose: Nose normal.     Mouth/Throat:      Mouth: Mucous membranes are moist.  Eyes:     Pupils: Pupils are equal, round, and reactive to light.  Cardiovascular:     Rate and Rhythm: Normal rate.  Pulmonary:     Effort: Pulmonary effort is normal.  Abdominal:     General: There is no distension.  Musculoskeletal:        General: Tenderness present.     Cervical back: Normal range of motion.     Comments: Tender left sternocleidomastoid area,  pain with movement  nv and ns intact   Neurological:     Mental Status: She is alert and oriented to person, place, and time.  Psychiatric:        Mood and Affect: Mood normal.    ED Results / Procedures / Treatments   Labs (all labs ordered are listed, but only abnormal results are displayed) Labs Reviewed - No data to display  EKG None  Radiology No results found.  Procedures Procedures   Medications Ordered in ED Medications - No data to display  ED Course  I have reviewed the triage vital signs and the nursing notes.  Pertinent labs & imaging results that were available during my care of the patient were reviewed by me and considered in my medical decision making (see chart for details).    MDM Rules/Calculators/A&P                            MDM:  Pt afebrile  looks good,  I doubt infection etiology,  symptoms consistent with torticollis  I discussed with pt and I will treat her with voltaren and robaxin.  Pt advised to recheck with her MD in 2-3 day if symptoms persit  Final Clinical Impression(s) / ED Diagnoses Final diagnoses:  Torticollis    Rx / DC Orders ED Discharge Orders          Ordered    diclofenac (VOLTAREN) 75 MG EC tablet  2 times daily        11/28/21 1726    methocarbamol (ROBAXIN) 500 MG tablet  4 times daily        11/28/21 1726          An After Visit Summary was printed and given to the patient.    [place],  Hollace Kinnier, PA-C 11/28/21 1732    Fredia Sorrow, MD 12/02/21 1323

## 2021-12-01 ENCOUNTER — Ambulatory Visit
Admission: RE | Admit: 2021-12-01 | Discharge: 2021-12-01 | Disposition: A | Discharge status: Home / Self Care | Payer: Managed Care, Other (non HMO) | Source: Ambulatory Visit | Attending: Internal Medicine | Admitting: Internal Medicine

## 2021-12-01 ENCOUNTER — Ambulatory Visit
Admission: RE | Admit: 2021-12-01 | Discharge: 2021-12-01 | Disposition: A | Discharge status: Home / Self Care | Payer: Managed Care, Other (non HMO) | Attending: Internal Medicine | Admitting: Internal Medicine

## 2021-12-01 ENCOUNTER — Other Ambulatory Visit: Payer: Self-pay | Admitting: Internal Medicine

## 2021-12-01 DIAGNOSIS — R059 Cough, unspecified: Secondary | ICD-10-CM | POA: Diagnosis present

## 2022-03-10 ENCOUNTER — Other Ambulatory Visit: Payer: Self-pay

## 2022-03-10 ENCOUNTER — Emergency Department: Payer: Managed Care, Other (non HMO)

## 2022-03-10 ENCOUNTER — Emergency Department
Admission: EM | Admit: 2022-03-10 | Discharge: 2022-03-10 | Disposition: A | Discharge status: Home / Self Care | Payer: Managed Care, Other (non HMO) | Attending: Emergency Medicine | Admitting: Emergency Medicine

## 2022-03-10 DIAGNOSIS — E876 Hypokalemia: Secondary | ICD-10-CM | POA: Diagnosis not present

## 2022-03-10 DIAGNOSIS — R0789 Other chest pain: Secondary | ICD-10-CM | POA: Diagnosis not present

## 2022-03-10 DIAGNOSIS — J029 Acute pharyngitis, unspecified: Secondary | ICD-10-CM | POA: Insufficient documentation

## 2022-03-10 DIAGNOSIS — D72829 Elevated white blood cell count, unspecified: Secondary | ICD-10-CM | POA: Insufficient documentation

## 2022-03-10 DIAGNOSIS — R000 Tachycardia, unspecified: Secondary | ICD-10-CM | POA: Insufficient documentation

## 2022-03-10 DIAGNOSIS — R002 Palpitations: Secondary | ICD-10-CM | POA: Diagnosis present

## 2022-03-10 DIAGNOSIS — R079 Chest pain, unspecified: Secondary | ICD-10-CM

## 2022-03-10 LAB — BASIC METABOLIC PANEL
Anion gap: 11 (ref 5–15)
BUN: 15 mg/dL (ref 6–20)
CO2: 20 mmol/L — ABNORMAL LOW (ref 22–32)
Calcium: 9.6 mg/dL (ref 8.9–10.3)
Chloride: 106 mmol/L (ref 98–111)
Creatinine, Ser: 0.63 mg/dL (ref 0.44–1.00)
GFR, Estimated: >60 mL/min (ref >60)
Glucose, Bld: 126 mg/dL — ABNORMAL HIGH (ref 70–99)
Potassium: 3 mmol/L — ABNORMAL LOW (ref 3.5–5.1)
Sodium: 137 mmol/L (ref 135–145)

## 2022-03-10 LAB — TROPONIN I (HIGH SENSITIVITY)
Troponin I (High Sensitivity): <2 ng/L (ref <18)
Troponin I (High Sensitivity): <2 ng/L (ref <18)

## 2022-03-10 LAB — CBC
HCT: 39.1 % (ref 36.0–46.0)
Hemoglobin: 13.1 g/dL (ref 12.0–15.0)
MCH: 27.3 pg (ref 26.0–34.0)
MCHC: 33.5 g/dL (ref 30.0–36.0)
MCV: 81.6 fL (ref 80.0–100.0)
Platelets: 460 10*3/uL — ABNORMAL HIGH (ref 150–400)
RBC: 4.79 MIL/uL (ref 3.87–5.11)
RDW: 13.3 % (ref 11.5–15.5)
WBC: 12.1 10*3/uL — ABNORMAL HIGH (ref 4.0–10.5)
nRBC: 0 % (ref 0.0–0.2)

## 2022-03-10 LAB — GROUP A STREP BY PCR: Group A Strep by PCR: NOT DETECTED

## 2022-03-10 LAB — HCG, QUANTITATIVE, PREGNANCY: hCG, Beta Chain, Quant, S: <1 m[IU]/mL (ref <5)

## 2022-03-10 LAB — MAGNESIUM: Magnesium: 2.1 mg/dL (ref 1.7–2.4)

## 2022-03-10 LAB — D-DIMER, QUANTITATIVE: D-Dimer, Quant: 0.37 ug/mL-FEU (ref 0.00–0.50)

## 2022-03-10 MED ORDER — POTASSIUM CHLORIDE CRYS ER 20 MEQ PO TBCR
40.0000 meq | EXTENDED_RELEASE_TABLET | Freq: Once | ORAL | Status: AC
  Administered 2022-03-10: 40 meq via ORAL
  Filled 2022-03-10: qty 2

## 2022-03-10 NOTE — ED Provider Notes (Signed)
? ?[facility] ?Provider Note ? ? ? Event Date/Time  ? First MD Initiated Contact with Patient 03/10/22 1317   ?  (approximate) ? ? ?History  ? ?Chest Pain ? ? ?HPI ? ?Cristina Baker is a 36 y.o. female with a past medical history of symptomatic tachycardia who presents for evaluation of some chest discomfort associated with some palpitations and sore throat.  Patient states that started in the last hour at home while she was at work having a croissant and some coffee.  She states she takes metoprolol for symptomatic tachycardia but is never had SVT or any other abnormal heart rhythms or other known problems with her heart or lungs.  She is never had a prior similar episodes.  She felt like she needed to breathe harder she was in a pass out.  She states she is feeling better now both with a sore throat and the chest discomfort.  She denies any chest pain per se.  No cough, fevers, nausea, vomiting, diarrhea, abdominal pain, back pain, earache, rash or any other sick symptoms.  No recent tobacco abuse, EtOH use or illicit drug use.  No other acute concerns at this time. ? ?  ? ? ?Physical Exam  ?Triage Vital Signs: ?ED Triage Vitals  ?Enc Vitals Group  ?   BP 03/10/22 1137 140/88  ?   Pulse Rate 03/10/22 1137 90  ?   Resp 03/10/22 1137 (!) 22  ?   Temp 03/10/22 1137 98 ?F (36.7 ?C)  ?   Temp Source 03/10/22 1137 Oral  ?   SpO2 03/10/22 1137 100 %  ?   Weight --   ?   Height --   ?   Head Circumference --   ?   Peak Flow --   ?   Pain Score 03/10/22 1130 0  ?   Pain Loc --   ?   Pain Edu? --   ?   Excl. in [place]? --   ? ? ?Most recent vital signs: ?Vitals:  ? 03/10/22 1137 03/10/22 1500  ?BP: 140/88 117/76  ?Pulse: 90 78  ?Resp: (!) 22 18  ?Temp: 98 ?F (36.7 ?C)   ?SpO2: 100% 100%  ? ? ?General: Awake, no distress.  ?CV:  Good peripheral perfusion.  2+ radial pulses. ?Resp:  Normal effort.  Clear bilaterally.  No murmur. ?Abd:  No distention.  Soft. ?Other:  Mild posterior oropharyngeal erythema.   No exudates significant tonsillar enlargement or uvular deviation. ? ? ?ED Results / Procedures / Treatments  ?Labs ?(all labs ordered are listed, but only abnormal results are displayed) ?Labs Reviewed  ?BASIC METABOLIC PANEL - Abnormal; Notable for the following components:  ?    Result Value  ? Potassium 3.0 (*)   ? CO2 20 (*)   ? Glucose, Bld 126 (*)   ? All other components within normal limits  ?CBC - Abnormal; Notable for the following components:  ? WBC 12.1 (*)   ? Platelets 460 (*)   ? All other components within normal limits  ?GROUP A STREP BY PCR  ?HCG, QUANTITATIVE, PREGNANCY  ?MAGNESIUM  ?D-DIMER, QUANTITATIVE (NOT AT [facility])  ?POC URINE PREG, ED  ?TROPONIN I (HIGH SENSITIVITY)  ?TROPONIN I (HIGH SENSITIVITY)  ? ? ? ?EKG ? ?ECG shows sinus tachycardia with a ventricular rate of 109, normal axis and some nonspecific ST changes in aVL without other clear evidence of acute ischemia or significant arrhythmia. ? ? ?RADIOLOGY ?Chest reviewed  by myself shows no focal consoidation, effusion, edema, pneumothorax or other clear acute thoracic process. I also reviewed radiology interpretation and agree with findings described. ? ? ? ?PROCEDURES: ? ?Critical Care performed: No ? ?Procedures ? ? ? ?MEDICATIONS ORDERED IN ED: ?Medications  ?potassium chloride SA (KLOR-CON M) CR tablet 40 mEq (40 mEq Oral Given 03/10/22 1444)  ? ? ? ?IMPRESSION / MDM / ASSESSMENT AND PLAN / ED COURSE  ?I reviewed the triage vital signs and the nursing notes. ?             ?               ? ?Differential diagnosis includes, but is not limited to ACS, myocarditis, PE, arrhythmia, strep pharyngitis, acute viral process, pneumonia possible GI etiologies. ? ?ECG shows sinus tachycardia with a ventricular rate of 109, normal axis and some nonspecific ST changes in aVL without other clear evidence of acute ischemia or significant arrhythmia.  Given nonelevated troponin x2 have a very low suspicion for ACS or myocarditis. ? ?Chest reviewed by  myself shows no focal consoidation, effusion, edema, pneumothorax or other clear acute thoracic process. I also reviewed radiology interpretation and agree with findings described. ? ?BMP is remarkable for K of 3 without any other significant electrolyte or metabolic derangements.  CBC shows WBC count of 12.1 with out evidence of acute anemia and platelets of 460.  hCG is negative.  Strep screen is negative.  Magnesium is within normal limits.  Overall I have a low suspicion for PE given D-dimer of 0.37. ? ?Considered observation and additional diagnostic studies given patient states he is feeling better with otherwise stable vitals of low suspicion for other immediate life-threatening process at this time I think she is stable for discharge with continued outpatient evaluation.  Discussed that she can follow-up with her cardiologist and PCP you can also have her potassium rechecked.  Emphasized importance of returning to the emergency room for any new or worsening of symptoms.  Discharged in stable condition.  Strict turn precautions advised and discussed. ?  ? ? ?FINAL CLINICAL IMPRESSION(S) / ED DIAGNOSES  ? ?Final diagnoses:  ?Chest pain, unspecified type  ?Hypokalemia  ? ? ? ?Rx / DC Orders  ? ?ED Discharge Orders   ? ? None  ? ?  ? ? ? ?Note:  This document was prepared using Dragon voice recognition software and may include unintentional dictation errors. ?  ?Lucrezia Starch, MD ?03/10/22 1600 ? ?

## 2022-03-10 NOTE — ED Notes (Signed)
Needs repeat trop ?

## 2022-03-10 NOTE — ED Triage Notes (Addendum)
Pt comes via EMs with c/o CP. EMS reports the pain started about 20 minutes ago while the pt was at work. Pt states burning sensation. Pt reports it feels like a flutter behind her heart. Pt denies any hx of this. Pt states she feels like if she doesn't breath and swallow then she will pass out. ? ?VSS ? ? ?Pt does have hx of ST in past. ? ?Pt states no pain but just a discomfort and hard to describe. ?

## 2022-03-19 ENCOUNTER — Other Ambulatory Visit: Payer: Self-pay

## 2022-03-19 ENCOUNTER — Emergency Department
Admission: EM | Admit: 2022-03-19 | Discharge: 2022-03-19 | Disposition: A | Discharge status: Home / Self Care | Payer: Managed Care, Other (non HMO) | Attending: Emergency Medicine | Admitting: Emergency Medicine

## 2022-03-19 ENCOUNTER — Encounter: Payer: Self-pay | Admitting: Emergency Medicine

## 2022-03-19 ENCOUNTER — Emergency Department: Payer: Managed Care, Other (non HMO)

## 2022-03-19 DIAGNOSIS — R079 Chest pain, unspecified: Secondary | ICD-10-CM | POA: Diagnosis not present

## 2022-03-19 DIAGNOSIS — R002 Palpitations: Secondary | ICD-10-CM | POA: Diagnosis present

## 2022-03-19 HISTORY — DX: Tachycardia, unspecified: R00.0

## 2022-03-19 LAB — CBC
HCT: 41.5 % (ref 36.0–46.0)
Hemoglobin: 13.2 g/dL (ref 12.0–15.0)
MCH: 26.8 pg (ref 26.0–34.0)
MCHC: 31.8 g/dL (ref 30.0–36.0)
MCV: 84.2 fL (ref 80.0–100.0)
Platelets: 406 10*3/uL — ABNORMAL HIGH (ref 150–400)
RBC: 4.93 MIL/uL (ref 3.87–5.11)
RDW: 13.2 % (ref 11.5–15.5)
WBC: 15 10*3/uL — ABNORMAL HIGH (ref 4.0–10.5)
nRBC: 0 % (ref 0.0–0.2)

## 2022-03-19 LAB — BASIC METABOLIC PANEL
Anion gap: 11 (ref 5–15)
BUN: 13 mg/dL (ref 6–20)
CO2: 22 mmol/L (ref 22–32)
Calcium: 9.6 mg/dL (ref 8.9–10.3)
Chloride: 106 mmol/L (ref 98–111)
Creatinine, Ser: 0.67 mg/dL (ref 0.44–1.00)
GFR, Estimated: >60 mL/min (ref >60)
Glucose, Bld: 104 mg/dL — ABNORMAL HIGH (ref 70–99)
Potassium: 3.9 mmol/L (ref 3.5–5.1)
Sodium: 139 mmol/L (ref 135–145)

## 2022-03-19 LAB — TROPONIN I (HIGH SENSITIVITY): Troponin I (High Sensitivity): <2 ng/L (ref <18)

## 2022-03-19 NOTE — ED Triage Notes (Signed)
Pt in via POV, reports ongoing, intermittent chest discomfort x approximately 1 week.  Reports currently following cardiology w/ multiple testing, now waiting on CCTA scan, which is currently scheduled for Monday 4/10.  Patient tearful in triage; appears anxious and fearful.  Vitals WDL, NAD noted at this time.   ?

## 2022-03-19 NOTE — ED Notes (Signed)
Pt in with co palpitations for 2 weeks, states has been seeing cardiologist for the same.  ?

## 2022-03-19 NOTE — ED Provider Notes (Signed)
? ?[facility] ?Provider Note ? ? ? Event Date/Time  ? First MD Initiated Contact with Patient 03/19/22 1528   ?  (approximate) ? ? ?History  ? ?Chest Pain ? ? ?HPI ? ?Cristina Baker is a 36 y.o. female  with pmh tachycardia presents with chest pain.  Patient notes that for several months she has had episodes of palpitations.  The episodes occur randomly and usually start with feeling like a skipped beat and then her heart rate will be faster.  She becomes just after these episodes and will experience some chest tightness and dyspnea but overall does not have significant chest pain.  There is no clear exacerbating factor.  Denies symptoms worse with exertion.  Denies lower extremity edema.  The patient denies hx of prior DVT/PE, unilateral leg pain/swelling, hormone use, recent surgery, hx of cancer, prolonged immobilization, or hemoptysis.  ?Patient has had several ED visits for this recently, has had negative D-dimer and negative troponin testing.  Patient follows with Dr. Humphrey Rolls with cardiology.  Is on metoprolol already, she is being uptitrated, history of tachycardia seen on Holter monitor.  Has never been diagnosed with SVT or atrial fibrillation.  Patient had a stress test done yesterday and she was told that it was essentially normal but there was 1 area of concern so she is scheduled for coronary CTA on Monday.  She was prescribed Xanax and buspirone by her primary care provider for anxiety.  Took the first dose of Xanax last night and put her into a fog.  When she took the buspirone this morning she became clammy and this brought on her symptoms.  Has had the buspirone before without issue. ? ?  ? ?Past Medical History:  ?Diagnosis Date  ? Abnormal Pap smear of cervix   ? BRCA negative 2015  ? MyRisk neg; IBIS=18/8%  ? Family history of breast cancer 2015  ? IBIS=18.8%  ? Family history of ovarian cancer   ? Herpes genitalis   ? PONV (postoperative nausea and vomiting)   ? nausea  ?  Tachycardia, unspecified   ? ? ?Patient Active Problem List  ? Diagnosis Date Noted  ? Lichen sclerosus 65/53/7482  ? Status post cesarean section 03/26/2016  ? ? ? ?Physical Exam  ?Triage Vital Signs: ?ED Triage Vitals  ?Enc Vitals Group  ?   BP 03/19/22 1446 (!) 140/99  ?   Pulse Rate 03/19/22 1446 88  ?   Resp 03/19/22 1446 17  ?   Temp 03/19/22 1446 97.7 ?F (36.5 ?C)  ?   Temp Source 03/19/22 1446 Oral  ?   SpO2 03/19/22 1446 97 %  ?   Weight 03/19/22 1447 190 lb (86.2 kg)  ?   Height 03/19/22 1447 '5\' 6"'  (1.676 m)  ?   Head Circumference --   ?   Peak Flow --   ?   Pain Score 03/19/22 1447 0  ?   Pain Loc --   ?   Pain Edu? --   ?   Excl. in [place]? --   ? ? ?Most recent vital signs: ?Vitals:  ? 03/19/22 1545 03/19/22 1644  ?BP: 120/79 122/70  ?Pulse: 77 72  ?Resp: 10 16  ?Temp:    ?SpO2: 99% 99%  ? ? ? ?General: Awake, patient is anxious appearing and tearful ?CV:  Good peripheral perfusion.  No lower extremity edema ?Resp:  Normal effort.  Lungs are clear ?Abd:  No distention.   ?Neuro:  Awake, Alert, Oriented x 3  ?Other:   ? ? ?ED Results / Procedures / Treatments  ?Labs ?(all labs ordered are listed, but only abnormal results are displayed) ?Labs Reviewed  ?BASIC METABOLIC PANEL - Abnormal; Notable for the following components:  ?    Result Value  ? Glucose, Bld 104 (*)   ? All other components within normal limits  ?CBC - Abnormal; Notable for the following components:  ? WBC 15.0 (*)   ? Platelets 406 (*)   ? All other components within normal limits  ?TROPONIN I (HIGH SENSITIVITY)  ? ? ? ?EKG ? ?EKG interpretation performed by myself: NSR, nml axis, nml intervals, no acute ischemic changes ? ? ? ?RADIOLOGY ?I reviewed the CXR which does not show any acute cardiopulmonary process; agree with radiology report  ? ? ? ?PROCEDURES: ? ?Critical Care performed: No ? ?Procedures ? ?The patient is on the cardiac monitor to evaluate for evidence of arrhythmia and/or significant heart rate  changes. ? ? ?MEDICATIONS ORDERED IN ED: ?Medications - No data to display ? ? ?IMPRESSION / MDM / ASSESSMENT AND PLAN / ED COURSE  ?I reviewed the triage vital signs and the nursing notes. ?             ?               ? ?Differential diagnosis includes, but is not limited to, tachycardia, SVT, anxiety/panic attacks less likely ACS, pulmonary embolism ? ?The patient is a 36 year old female who presents with ongoing palpitations.  She is being worked up by cardiology for this has a history of tachycardia which she takes metoprolol for and has been being uptitrated.  Today she had an episode of symptoms after taking the buspirone.  Patient is quite anxious and tearful on exam is not sure why she continues to have the symptoms and is quite anxious about them.  She had a stress test done yesterday and has a coronary CTA scheduled for Monday because apparently there was some area of question on the stress test.  Patient does have some chest tightness during the episodes but she has no exertional chest pain and is asymptomatic currently my suspicion for acute coronary syndrome is quite low.  She has a nonischemic EKG and negative high-sensitivity troponin.  Has had D-dimer testing in the past and has no PE risk factors and is PERC negative.  Ultimately I suspect that she is having some intermittent tachycardia causing the palpitations which then causes worsening of her anxiety.  I did offer her p.o. Xanax in the ED but she declined.  No indication for more work-up at this time.  She will follow-up with cardiology as scheduled on Monday. ? ?  ? ? ?FINAL CLINICAL IMPRESSION(S) / ED DIAGNOSES  ? ?Final diagnoses:  ?Palpitations  ? ? ? ?Rx / DC Orders  ? ?ED Discharge Orders   ? ? None  ? ?  ? ? ? ?Note:  This document was prepared using Dragon voice recognition software and may include unintentional dictation errors. ?  ?Rada Hay, MD ?03/19/22 1759 ? ?

## 2022-03-19 NOTE — Discharge Instructions (Addendum)
Your EKG, chest x-ray and blood work were all reassuring today.  Your troponin which is your cardiac enzymes was undetectable making it very unlikely that this is a heart attack.  Please continue to take your metoprolol as prescribed and follow-up with Dr. Humphrey Rolls.  ?

## 2022-04-09 DIAGNOSIS — I491 Atrial premature depolarization: Secondary | ICD-10-CM | POA: Insufficient documentation

## 2022-04-09 DIAGNOSIS — R002 Palpitations: Secondary | ICD-10-CM | POA: Insufficient documentation

## 2022-04-09 DIAGNOSIS — I493 Ventricular premature depolarization: Secondary | ICD-10-CM | POA: Insufficient documentation

## 2022-04-12 ENCOUNTER — Emergency Department: Payer: Managed Care, Other (non HMO)

## 2022-04-12 ENCOUNTER — Other Ambulatory Visit: Payer: Self-pay

## 2022-04-12 ENCOUNTER — Encounter: Payer: Self-pay | Admitting: Emergency Medicine

## 2022-04-12 DIAGNOSIS — R002 Palpitations: Secondary | ICD-10-CM | POA: Diagnosis not present

## 2022-04-12 DIAGNOSIS — R0789 Other chest pain: Secondary | ICD-10-CM | POA: Diagnosis present

## 2022-04-12 LAB — CBC
HCT: 39.1 % (ref 36.0–46.0)
Hemoglobin: 12.6 g/dL (ref 12.0–15.0)
MCH: 26.9 pg (ref 26.0–34.0)
MCHC: 32.2 g/dL (ref 30.0–36.0)
MCV: 83.4 fL (ref 80.0–100.0)
Platelets: 361 10*3/uL (ref 150–400)
RBC: 4.69 MIL/uL (ref 3.87–5.11)
RDW: 13.2 % (ref 11.5–15.5)
WBC: 10.4 10*3/uL (ref 4.0–10.5)
nRBC: 0 % (ref 0.0–0.2)

## 2022-04-12 NOTE — ED Triage Notes (Signed)
Patient ambulatory to triage with steady gait, without difficulty or distress noted; pt reports "chest discomfort and palpitations all day"; currently has heart monitor in place  ?

## 2022-04-13 ENCOUNTER — Emergency Department
Admission: EM | Admit: 2022-04-13 | Discharge: 2022-04-13 | Disposition: A | Discharge status: Home / Self Care | Payer: Managed Care, Other (non HMO) | Attending: Emergency Medicine | Admitting: Emergency Medicine

## 2022-04-13 DIAGNOSIS — R002 Palpitations: Secondary | ICD-10-CM

## 2022-04-13 DIAGNOSIS — R079 Chest pain, unspecified: Secondary | ICD-10-CM

## 2022-04-13 LAB — BASIC METABOLIC PANEL
Anion gap: 6 (ref 5–15)
BUN: 15 mg/dL (ref 6–20)
CO2: 26 mmol/L (ref 22–32)
Calcium: 9.5 mg/dL (ref 8.9–10.3)
Chloride: 106 mmol/L (ref 98–111)
Creatinine, Ser: 0.64 mg/dL (ref 0.44–1.00)
GFR, Estimated: >60 mL/min (ref >60)
Glucose, Bld: 101 mg/dL — ABNORMAL HIGH (ref 70–99)
Potassium: 3.4 mmol/L — ABNORMAL LOW (ref 3.5–5.1)
Sodium: 138 mmol/L (ref 135–145)

## 2022-04-13 LAB — TROPONIN I (HIGH SENSITIVITY)
Troponin I (High Sensitivity): <2 ng/L (ref <18)
Troponin I (High Sensitivity): <2 ng/L (ref <18)

## 2022-04-13 NOTE — ED Provider Notes (Addendum)
[facility] Provider Note    Event Date/Time   First MD Initiated Contact with Patient 04/13/22 [phone]     (approximate)   History   Chest Pain   HPI  Cristina Baker is a 36 y.o. female with history of palpitations who presents to the emergency department with complaints of intermittent palpitations and right-sided chest pressure that started tonight.  No shortness of breath, fevers, cough, nausea, vomiting, diaphoresis, lightheadedness.  States symptoms have now resolved.  She was seen by Dr. Lennette Bihari with cardiology previously but now is being followed by Dr. Darrold Junker and has been wearing a heart monitor.  States she has had blood work and has had her thyroid checked.  No history of PE, DVT, exogenous estrogen use, recent fractures, surgery, trauma, hospitalization, prolonged travel or other immobilization. No lower extremity swelling or pain. No calf tenderness.   History provided by patient and friend.    Past Medical History:  Diagnosis Date   Abnormal Pap smear of cervix    BRCA negative 2015   MyRisk neg; IBIS=18/8%   Family history of breast cancer 2015   IBIS=18.8%   Family history of ovarian cancer    Herpes genitalis    PONV (postoperative nausea and vomiting)    nausea   Tachycardia, unspecified     Past Surgical History:  Procedure Laterality Date   CESAREAN SECTION     CESAREAN SECTION WITH BILATERAL TUBAL LIGATION N/A 03/26/2016   Procedure: CESAREAN SECTION WITH BILATERAL TUBAL LIGATION;  Surgeon: Conard Novak, MD;  Location: [facility] ORS;  Service: Obstetrics;  Laterality: N/A;   COLPOSCOPY     IUD REMOVAL      MEDICATIONS:  Prior to Admission medications   Medication Sig Start Date End Date Taking? Authorizing Provider  amphetamine-dextroamphetamine (ADDERALL XR) 20 MG 24 hr capsule Take 20 mg by mouth daily. Patient not taking: Reported on 06/09/2021    [provider]  amphetamine-dextroamphetamine (ADDERALL) 20 MG  tablet Take 20 mg by mouth 2 (two) times daily. 06/08/21   [provider]  clobetasol ointment (TEMOVATE) 0.05 % Apply to affected area twice daily for 2 weeks, then HS for 2 weeks and then M-W-F HS x 2 weeks 06/09/21   Conard Novak, MD  diclofenac (VOLTAREN) 75 MG EC tablet Take 1 tablet (75 mg total) by mouth 2 (two) times daily. 11/28/21   Elson Areas, PA-C  methocarbamol (ROBAXIN) 500 MG tablet Take 1 tablet (500 mg total) by mouth 4 (four) times daily. 11/28/21   Elson Areas, PA-C  valACYclovir (VALTREX) 1000 MG tablet TAKE 1 TABLET (1,000 MG) BY ORAL ROUTE ONCE DAILY Patient not taking: No sig reported 03/15/17   Vena Austria, MD    Physical Exam   Triage Vital Signs: ED Triage Vitals  Enc Vitals Group     BP 04/12/22 2328 (!) 140/97     Pulse Rate 04/12/22 2328 100     Resp 04/12/22 2328 18     Temp 04/12/22 2328 98 F (36.7 C)     Temp Source 04/12/22 2328 Oral     SpO2 04/12/22 2328 95 %     Weight 04/12/22 2328 190 lb (86.2 kg)     Height 04/12/22 2328 5\' 7"  (1.702 m)     Head Circumference --      Peak Flow --      Pain Score 04/12/22 2327 6     Pain Loc --  Pain Edu? --      Excl. in [place]? --     Most recent vital signs: Vitals:   04/13/22 0157 04/13/22 0330  BP: 124/86 114/79  Pulse: 86 83  Resp: 20 15  Temp:    SpO2: 98% 97%    CONSTITUTIONAL: Alert and oriented and responds appropriately to questions. Well-appearing; well-nourished HEAD: Normocephalic, atraumatic EYES: Conjunctivae clear, pupils appear equal, sclera nonicteric ENT: normal nose; moist mucous membranes NECK: Supple, normal ROM CARD: RRR; S1 and S2 appreciated; no murmurs, no clicks, no rubs, no gallops RESP: Normal chest excursion without splinting or tachypnea; breath sounds clear and equal bilaterally; no wheezes, no rhonchi, no rales, no hypoxia or respiratory distress, speaking full sentences ABD/GI: Normal bowel sounds; non-distended; soft, non-tender, no  rebound, no guarding, no peritoneal signs BACK: The back appears normal EXT: Normal ROM in all joints; no deformity noted, no edema; no cyanosis, no calf tenderness or calf swelling SKIN: Normal color for age and race; warm; no rash on exposed skin NEURO: Moves all extremities equally, normal speech PSYCH: The patient's mood and manner are appropriate.   ED Results / Procedures / Treatments   LABS: (all labs ordered are listed, but only abnormal results are displayed) Labs Reviewed  BASIC METABOLIC PANEL - Abnormal; Notable for the following components:      Result Value   Potassium 3.4 (*)    Glucose, Bld 101 (*)    All other components within normal limits  CBC  TROPONIN I (HIGH SENSITIVITY)  TROPONIN I (HIGH SENSITIVITY)     EKG:  EKG Interpretation  Date/Time:  Monday Apr 12 2022 23:33:32 EDT Ventricular Rate:  91 PR Interval:  132 QRS Duration: 84 QT Interval:  350 QTC Calculation: 430 R Axis:   31 Text Interpretation: Normal sinus rhythm Nonspecific ST and T wave abnormality Abnormal ECG When compared with ECG of 19-Mar-2022 14:55, No significant change was found Confirmed by Rochele Raring [phone]) on 04/13/2022 3:07:54 AM         RADIOLOGY: My personal review and interpretation of imaging: Chest x-ray clear.  I have personally reviewed all radiology reports.   DG Chest 2 View  Result Date: 04/13/2022 CLINICAL DATA:  Chest discomfort. EXAM: CHEST - 2 VIEW COMPARISON:  March 19, 2022 FINDINGS: The heart size and mediastinal contours are within normal limits. Both lungs are clear. The visualized skeletal structures are unremarkable. IMPRESSION: No active cardiopulmonary disease. Electronically Signed   By: Aram Candela M.D.   On: 04/13/2022 00:10     PROCEDURES:  Critical Care performed: No     .1-3 Lead EKG Interpretation Performed by: Lamon Rotundo, Layla Maw, DO Authorized by: Curren Mohrmann, Layla Maw, DO     Interpretation: normal     ECG rate:  83   ECG rate  assessment: normal     Rhythm: sinus rhythm     Ectopy: none     Conduction: normal      IMPRESSION / MDM / ASSESSMENT AND PLAN / ED COURSE  I reviewed the triage vital signs and the nursing notes.    Patient here with atypical right-sided chest pain and palpitations.  Symptoms have resolved.  The patient is on the cardiac monitor to evaluate for evidence of arrhythmia and/or significant heart rate changes.   DIFFERENTIAL DIAGNOSIS (includes but not limited to):   ACS, PE, dissection, pneumonia, pneumothorax, CHF, anemia, electrolyte derangement, thyroid dysfunction, dehydration   PLAN: We will obtain CBC, BMP, troponin x2, chest x-ray.  States  she has had normal thyroid function as an outpatient.   MEDICATIONS GIVEN IN ED: Medications - No data to display   ED COURSE: Patient's labs show no significant abnormality.  No anemia, electrolyte derangement.  Troponin x2 negative.  Chest x-ray reviewed by myself and radiologist and shows no infiltrate, edema or pneumothorax.  EKG shows no new ischemic change.  She is asymptomatic.  No event seen on cardiac monitoring.  I feel she is safe for discharge home with close outpatient cardiology follow-up.   At this time, I do not feel there is any life-threatening condition present. I reviewed all nursing notes, vitals, pertinent previous records.  All lab and urine results, EKGs, imaging ordered have been independently reviewed and interpreted by myself.  I reviewed all available radiology reports from any imaging ordered this visit.  Based on my assessment, I feel the patient is safe to be discharged home without further emergent workup and can continue workup as an outpatient as needed. Discussed all findings, treatment plan as well as usual and customary return precautions with patient.  They verbalize understanding and are comfortable with this plan.  Outpatient follow-up has been provided as needed.  All questions have been  answered.   CONSULTS: No emergent cardiology consult needed given reassuring work-up and patient now asymptomatic.  No significant risk factors for ACS.  She is PERC negative.   OUTSIDE RECORDS REVIEWED: Reviewed Dr. Darrold Junker note on 04/09/2022.         FINAL CLINICAL IMPRESSION(S) / ED DIAGNOSES   Final diagnoses:  Right-sided chest pain  Palpitations     Rx / DC Orders   ED Discharge Orders     None        Note:  This document was prepared using Dragon voice recognition software and may include unintentional dictation errors.   Ettore Trebilcock, Layla Maw, DO 04/13/22 0340    Cameryn Chrisley, Layla Maw, DO 04/13/22 [phone]

## 2022-04-13 NOTE — ED Notes (Signed)
ED Provider at bedside. 

## 2022-06-01 ENCOUNTER — Encounter: Payer: Self-pay | Admitting: Emergency Medicine

## 2022-06-01 ENCOUNTER — Emergency Department: Payer: Managed Care, Other (non HMO)

## 2022-06-01 ENCOUNTER — Other Ambulatory Visit: Payer: Self-pay

## 2022-06-01 ENCOUNTER — Emergency Department
Admission: EM | Admit: 2022-06-01 | Discharge: 2022-06-01 | Disposition: A | Discharge status: Home / Self Care | Payer: Managed Care, Other (non HMO) | Attending: Emergency Medicine | Admitting: Emergency Medicine

## 2022-06-01 DIAGNOSIS — R079 Chest pain, unspecified: Secondary | ICD-10-CM | POA: Diagnosis not present

## 2022-06-01 DIAGNOSIS — F419 Anxiety disorder, unspecified: Secondary | ICD-10-CM | POA: Diagnosis not present

## 2022-06-01 DIAGNOSIS — E876 Hypokalemia: Secondary | ICD-10-CM | POA: Diagnosis not present

## 2022-06-01 DIAGNOSIS — R002 Palpitations: Secondary | ICD-10-CM

## 2022-06-01 LAB — BASIC METABOLIC PANEL
Anion gap: 6 (ref 5–15)
BUN: 12 mg/dL (ref 6–20)
CO2: 24 mmol/L (ref 22–32)
Calcium: 9.2 mg/dL (ref 8.9–10.3)
Chloride: 107 mmol/L (ref 98–111)
Creatinine, Ser: 0.46 mg/dL (ref 0.44–1.00)
GFR, Estimated: >60 mL/min (ref >60)
Glucose, Bld: 121 mg/dL — ABNORMAL HIGH (ref 70–99)
Potassium: 3 mmol/L — ABNORMAL LOW (ref 3.5–5.1)
Sodium: 137 mmol/L (ref 135–145)

## 2022-06-01 LAB — CBC
HCT: 37.8 % (ref 36.0–46.0)
Hemoglobin: 12.3 g/dL (ref 12.0–15.0)
MCH: 27 pg (ref 26.0–34.0)
MCHC: 32.5 g/dL (ref 30.0–36.0)
MCV: 83.1 fL (ref 80.0–100.0)
Platelets: 390 10*3/uL (ref 150–400)
RBC: 4.55 MIL/uL (ref 3.87–5.11)
RDW: 13 % (ref 11.5–15.5)
WBC: 9.5 10*3/uL (ref 4.0–10.5)
nRBC: 0 % (ref 0.0–0.2)

## 2022-06-01 LAB — TROPONIN I (HIGH SENSITIVITY): Troponin I (High Sensitivity): <2 ng/L (ref <18)

## 2022-06-01 MED ORDER — POTASSIUM CHLORIDE CRYS ER 10 MEQ PO TBCR: 10.0000 meq | EXTENDED_RELEASE_TABLET | Freq: Two times a day (BID) | ORAL | 0 refills | Status: DC

## 2022-06-01 NOTE — ED Notes (Signed)
See triage note  presents with an episode of chest tightness and felt like her heart rate increased   positive dizziness    states episode last about 1 hour

## 2022-06-01 NOTE — ED Triage Notes (Signed)
First Nurse Note;  Pt via EMS from work. Pt c/o chest tightness, dizziness, pt has been seen by a cardiologist. While riding with EMS, pt had episodes of increase in her HR and that's when she felt dizzy. Pt is A&Ox4 and NAD  150/102 89-120 HR  127 CBG 97.7 20G L AC.

## 2022-06-01 NOTE — ED Provider Notes (Signed)
[facility] Provider Note    Event Date/Time   First MD Initiated Contact with Patient 06/01/22 1807     (approximate)  History   Chief Complaint: Chest Pain  HPI  Cristina Baker is a 36 y.o. female with a past medical history of tachycardia, palpitations, anxiety, presents emergency department for an episode of chest tightness and palpitations.  According to the patient for the last few months or years she has been experiencing symptoms of palpitations.  States she has had significant cardiac work-up including CT scans, Holter monitors.  Patient follows up with cardiology.  Patient has been started on metoprolol as well as Lexapro for anxiety she also has alprazolam for anxiety.  Patient did not take any alprazolam today but did take her normal metoprolol.  Patient states around 2 PM or so today she felt palpitations/fluttering in the chest as well as a flushing sensation over her body.  Patient was concerned so she came to the emergency department for evaluation.  Physical Exam   Triage Vital Signs: ED Triage Vitals  Enc Vitals Group     BP 06/01/22 1553 (!) 146/88     Pulse Rate 06/01/22 1553 94     Resp 06/01/22 1553 16     Temp 06/01/22 1553 98.2 F (36.8 C)     Temp Source 06/01/22 1553 Oral     SpO2 06/01/22 1553 98 %     Weight 06/01/22 1554 185 lb (83.9 kg)     Height 06/01/22 1554 '5\' 7"'$  (1.702 m)     Head Circumference --      Peak Flow --      Pain Score 06/01/22 1554 5     Pain Loc --      Pain Edu? --      Excl. in [place]? --     Most recent vital signs: Vitals:   06/01/22 1553 06/01/22 1811  BP: (!) 146/88 140/88  Pulse: 94 88  Resp: 16 16  Temp: 98.2 F (36.8 C)   SpO2: 98% 98%    General: Awake, no distress.  CV:  Good peripheral perfusion.  Regular rate and rhythm  Resp:  Normal effort.  Equal breath sounds bilaterally.  Abd:  No distention.  Soft, nontender.  No rebound or guarding.    ED Results / Procedures /  Treatments   EKG  EKG viewed and interpreted by myself shows a normal sinus rhythm at 79 bpm with a narrow QRS, normal axis, normal intervals, no concerning ST changes.  RADIOLOGY  I have interpreted the chest x-ray images, no significant abnormality seen on my evaluation. Radiology is read the chest x-ray is negative  MEDICATIONS ORDERED IN ED: Medications - No data to display   IMPRESSION / MDM / [place] / ED COURSE  I reviewed the triage vital signs and the nursing notes.  Patient's presentation is most consistent with acute presentation with potential threat to life or bodily function.  Patient presents emergency department for palpitations and flushing.  Patient states she has had similar symptoms in the past has had significant cardiology work-up including CTAs, Holter monitor.  Patient currently takes metoprolol Lexapro and alprazolam for the symptoms.  Did not take any alprazolam today.  Overall the patient appears well on my examination.  States she is feeling much better currently.  Vital signs are reassuring.  Patient's labs are reassuring with a negative troponin, reassuring CBC patient does have mild hypokalemia and given her palpitation  symptoms we will replete for the next 7 days.  Given the patient's symptoms I did consider admission to the hospital however given her reassuring work-up and she already has great cardiology follow-up we will discharge the patient home.  Patient will follow-up with her heart doctor.  Discussed my typical return precautions.  FINAL CLINICAL IMPRESSION(S) / ED DIAGNOSES   Chest pain Palpitations Anxiety   Note:  This document was prepared using Dragon voice recognition software and may include unintentional dictation errors.   Harvest Dark, MD 06/01/22 1910

## 2022-06-01 NOTE — ED Triage Notes (Signed)
Pt here via [facility] with chest tightness in her upper chest. Pt states she felt like her heart was racing and her ears where burning. Pt was also diaphoretic at the moment but is not currently. Pt has a cardiologist which said she was having PVCs. Pt stable at the moment.

## 2022-06-01 NOTE — ED Provider Triage Note (Signed)
Emergency Medicine Provider Triage Evaluation Note  Cristina Baker , a 36 y.o. female  was evaluated in triage.  Pt complains of chest pressure, shortness of breath, history of PVCs.  Review of Systems  Positive: See above Negative: Vomiting, diaphoresis  Physical Exam  BP (!) 146/88 (BP Location: Left Arm)   Pulse 94   Temp 98.2 F (36.8 C) (Oral)   Resp 16   Ht '5\' 7"'$  (1.702 m)   Wt 83.9 kg   LMP 06/01/2022 (Exact Date)   SpO2 98%   BMI 28.98 kg/m  Gen:   Awake, no distress  Resp:  Normal effort  MSK:   Moves extremities without difficulty  Other:    Medical Decision Making  Medically screening exam initiated at 3:57 PM.  Appropriate orders placed.  Cristina Baker was informed that the remainder of the evaluation will be completed by another provider, this initial triage assessment does not replace that evaluation, and the importance of remaining in the ED until their evaluation is complete.  Chest pain protocols initiated   Versie Starks, PA-C 06/01/22 1558

## 2022-07-12 ENCOUNTER — Ambulatory Visit (INDEPENDENT_AMBULATORY_CARE_PROVIDER_SITE_OTHER): Payer: Managed Care, Other (non HMO) | Admitting: Advanced Practice Midwife

## 2022-07-12 ENCOUNTER — Other Ambulatory Visit (HOSPITAL_COMMUNITY)
Admission: RE | Admit: 2022-07-12 | Discharge: 2022-07-12 | Disposition: A | Discharge status: Home / Self Care | Payer: Managed Care, Other (non HMO) | Source: Ambulatory Visit | Attending: Advanced Practice Midwife | Admitting: Advanced Practice Midwife

## 2022-07-12 ENCOUNTER — Encounter: Payer: Self-pay | Admitting: Advanced Practice Midwife

## 2022-07-12 VITALS — BP 120/80 | Ht 67.0 in | Wt 192.0 lb

## 2022-07-12 DIAGNOSIS — Z01419 Encounter for gynecological examination (general) (routine) without abnormal findings: Secondary | ICD-10-CM | POA: Insufficient documentation

## 2022-07-12 DIAGNOSIS — L292 Pruritus vulvae: Secondary | ICD-10-CM | POA: Diagnosis not present

## 2022-07-12 DIAGNOSIS — N760 Acute vaginitis: Secondary | ICD-10-CM | POA: Diagnosis not present

## 2022-07-12 DIAGNOSIS — N763 Subacute and chronic vulvitis: Secondary | ICD-10-CM | POA: Diagnosis not present

## 2022-07-12 NOTE — Patient Instructions (Signed)
Managing Anxiety, Adult After being diagnosed with anxiety, you may be relieved to know why you have felt or behaved a certain way. You may also feel overwhelmed about the treatment ahead and what it will mean for your life. With care and support, you can manage this condition. How to manage lifestyle changes Managing stress and anxiety  Stress is your body's reaction to life changes and events, both good and bad. Most stress will last just a few hours, but stress can be ongoing and can lead to more than just stress. Although stress can play a major role in anxiety, it is not the same as anxiety. Stress is usually caused by something external, such as a deadline, test, or competition. Stress normally passes after the triggering event has ended.  Anxiety is caused by something internal, such as imagining a terrible outcome or worrying that something will go wrong that will devastate you. Anxiety often does not go away even after the triggering event is over, and it can become long-term (chronic) worry. It is important to understand the differences between stress and anxiety and to manage your stress effectively so that it does not lead to an anxious response. Talk with your health care provider or a counselor to learn more about reducing anxiety and stress. He or she may suggest tension reduction techniques, such as: Music therapy. Spend time creating or listening to music that you enjoy and that inspires you. Mindfulness-based meditation. Practice being aware of your normal breaths while not trying to control your breathing. It can be done while sitting or walking. Centering prayer. This involves focusing on a word, phrase, or sacred image that means something to you and brings you peace. Deep breathing. To do this, expand your stomach and inhale slowly through your nose. Hold your breath for 3-5 seconds. Then exhale slowly, letting your stomach muscles relax. Self-talk. Learn to notice and identify  thought patterns that lead to anxiety reactions and change those patterns to thoughts that feel peaceful. Muscle relaxation. Taking time to tense muscles and then relax them. Choose a tension reduction technique that fits your lifestyle and personality. These techniques take time and practice. Set aside 5-15 minutes a day to do them. Therapists can offer counseling and training in these techniques. The training to help with anxiety may be covered by some insurance plans. Other things you can do to manage stress and anxiety include: Keeping a stress diary. This can help you learn what triggers your reaction and then learn ways to manage your response. Thinking about how you react to certain situations. You may not be able to control everything, but you can control your response. Making time for activities that help you relax and not feeling guilty about spending your time in this way. Doing visual imagery. This involves imagining or creating mental pictures to help you relax. Practicing yoga. Through yoga poses, you can lower tension and promote relaxation.  Medicines Medicines can help ease symptoms. Medicines for anxiety include: Antidepressant medicines. These are usually prescribed for long-term daily control. Anti-anxiety medicines. These may be added in severe cases, especially when panic attacks occur. Medicines will be prescribed by a health care provider. When used together, medicines, psychotherapy, and tension reduction techniques may be the most effective treatment. Relationships Relationships can play a big part in helping you recover. Try to spend more time connecting with trusted friends and family members. Consider going to couples counseling if you have a partner, taking family education classes, or going to  family therapy. Therapy can help you and others better understand your condition. How to recognize changes in your anxiety Everyone responds differently to treatment for  anxiety. Recovery from anxiety happens when symptoms decrease and stop interfering with your daily activities at home or work. This may mean that you will start to: Have better concentration and focus. Worry will interfere less in your daily thinking. Sleep better. Be less irritable. Have more energy. Have improved memory. It is also important to recognize when your condition is getting worse. Contact your health care provider if your symptoms interfere with home or work and you feel like your condition is not improving. Follow these instructions at home: Activity Exercise. Adults should do the following: Exercise for at least 150 minutes each week. The exercise should increase your heart rate and make you sweat (moderate-intensity exercise). Strengthening exercises at least twice a week. Get the right amount and quality of sleep. Most adults need 7-9 hours of sleep each night. Lifestyle  Eat a healthy diet that includes plenty of vegetables, fruits, whole grains, low-fat dairy products, and lean protein. Do not eat a lot of foods that are high in fats, added sugars, or salt (sodium). Make choices that simplify your life. Do not use any products that contain nicotine or tobacco. These products include cigarettes, chewing tobacco, and vaping devices, such as e-cigarettes. If you need help quitting, ask your health care provider. Avoid caffeine, alcohol, and certain over-the-counter cold medicines. These may make you feel worse. Ask your pharmacist which medicines to avoid. General instructions Take over-the-counter and prescription medicines only as told by your health care provider. Keep all follow-up visits. This is important. Where to find support You can get help and support from these sources: Self-help groups. Online and [employer]. A trusted spiritual leader. Couples counseling. Family education classes. Family therapy. Where to find more information You may find  that joining a support group helps you deal with your anxiety. The following sources can help you locate counselors or support groups near you: [place]: www.mentalhealthamerica.net Anxiety and Depression Association of [place] (ADAA): https://www.clark.net/ National Alliance on Mental Illness (NAMI): www.nami.org Contact a health care provider if: You have a hard time staying focused or finishing daily tasks. You spend many hours a day feeling worried about everyday life. You become exhausted by worry. You start to have headaches or frequently feel tense. You develop chronic nausea or diarrhea. Get help right away if: You have a racing heart and shortness of breath. You have thoughts of hurting yourself or others. If you ever feel like you may hurt yourself or others, or have thoughts about taking your own life, get help right away. Go to your nearest emergency department or: Call your local emergency services (911 in the [place] Call a suicide crisis helpline, such as the [facility] at 213 727 0423 or 988 in the [place]. This is open 24 hours a day in the [place] Text the Crisis Text Line at 5061144482 (in the [place].). Summary Taking steps to learn and use tension reduction techniques can help calm you and help prevent triggering an anxiety reaction. When used together, medicines, psychotherapy, and tension reduction techniques may be the most effective treatment. Family, friends, and partners can play a big part in supporting you. This information is not intended to replace advice given to you by your health care provider. Make sure you discuss any questions you have with your health care provider. Document Revised: 06/24/2021 Document Reviewed: 03/22/2021 Elsevier  Patient Education  [place], Adult Feeling a certain amount of stress is normal. Stress helps our body and mind get ready to deal with the demands of life. Stress hormones can  motivate you to do well at work and meet your responsibilities. But severe or long-term (chronic) stress can affect your mental and physical health. Chronic stress puts you at higher risk for: Anxiety and depression. Other health problems such as digestive problems, muscle aches, heart disease, high blood pressure, and stroke. What are the causes? Common causes of stress include: Demands from work, such as deadlines, feeling overworked, or having long hours. Pressures at home, such as money issues, disagreements with a spouse, or parenting issues. Pressures from major life changes, such as divorce, moving, loss of a loved one, or chronic illness. You may be at higher risk for stress-related problems if you: Do not get enough sleep. Are in poor health. Do not have emotional support. Have a mental health disorder such as anxiety or depression. How to recognize stress Stress can make you: Have trouble sleeping. Feel sad, anxious, irritable, or overwhelmed. Lose your appetite. Overeat or want to eat unhealthy foods. Want to use drugs or alcohol. Stress can also cause physical symptoms, such as: Sore, tense muscles, especially in the shoulders and neck. Headaches. Trouble breathing. A faster heart rate. Stomach pain, nausea, or vomiting. Diarrhea or constipation. Trouble concentrating. Follow these instructions at home: Eating and drinking Eat a healthy diet. This includes: Eating foods that are high in fiber, such as beans, whole grains, and fresh fruits and vegetables. Limiting foods that are high in fat and processed sugars, such as fried or sweet foods. Do not skip meals or overeat. Drink enough fluid to keep your urine pale yellow. Alcohol use Do not drink alcohol if: Your health care provider tells you not to drink. You are pregnant, may be pregnant, or are planning to become pregnant. Drinking alcohol is a way some people try to ease their stress. This can be dangerous, so if  you drink alcohol: Limit how much you have to: 0-1 drink a day for women. 0-2 drinks a day for men. Know how much alcohol is in your drink. In the [place] one drink equals one 12 oz bottle of beer (355 mL), one 5 oz glass of wine (148 mL), or one 1 oz glass of hard liquor (44 mL). Activity  Include 30 minutes of exercise in your daily schedule. Exercise is a good stress reducer. Include time in your day for an activity that you find relaxing. Try taking a walk, going on a bike ride, reading a book, or listening to music. Schedule your time in a way that lowers stress, and keep a regular schedule. Focus on doing what is most important to get done. Lifestyle Identify the source of your stress and your reaction to it. See a therapist who can help you change unhelpful reactions. When there are stressful events: Talk about them with family, friends, or coworkers. Try to think realistically about stressful events and not ignore them or overreact. Try to find the positives in a stressful situation and not focus on the negatives. Cut back on responsibilities at work and home, if possible. Ask for help from friends or family members if you need it. Find ways to manage stress, such as: Mindfulness, meditation, or deep breathing. Yoga or tai chi. Progressive muscle relaxation. Spending time in nature. Doing art, playing music, or reading. Making time for fun activities. Spending  time with family and friends. Get support from family, friends, or spiritual resources. General instructions Get enough sleep. Try to go to sleep and get up at about the same time every day. Take over-the-counter and prescription medicines only as told by your health care provider. Do not use any products that contain nicotine or tobacco. These products include cigarettes, chewing tobacco, and vaping devices, such as e-cigarettes. If you need help quitting, ask your health care provider. Do not use drugs or smoke to deal  with stress. Keep all follow-up visits. This is important. Where to find support Talk with your health care provider about stress management or finding a support group. Find a therapist to work with you on your stress management techniques. Where to find more information [employer] on Mental Illness: www.nami.org American Psychological Association: TVStereos.ch Contact a health care provider if: Your stress symptoms get worse. You are unable to manage your stress at home. You are struggling to stop using drugs or alcohol. Get help right away if: You may be a danger to yourself or others. You have any thoughts of death or suicide. Get help right awayif you feel like you may hurt yourself or others, or have thoughts about taking your own life. Go to your nearest emergency room or: Call 911. Call the [place] at 270-347-0841 or 988 in the [place] This is open 24 hours a day. Text the Crisis Text Line at (502) 554-1321. Summary Feeling a certain amount of stress is normal, but severe or long-term (chronic) stress can affect your mental and physical health. Chronic stress can put you at higher risk for anxiety, depression, and other health problems such as digestive problems, muscle aches, heart disease, high blood pressure, and stroke. You may be at higher risk for stress-related problems if you do not get enough sleep, are in poor health, lack emotional support, or have a mental health disorder such as anxiety or depression. Identify the source of your stress and your reaction to it. Try talking about stressful events with family, friends, or coworkers, finding a coping method, or getting support from spiritual resources. If you need more help, talk with your health care provider about finding a support group or a mental health therapist. This information is not intended to replace advice given to you by your health care provider. Make sure you discuss any questions  you have with your health care provider. Document Revised: 06/25/2021 Document Reviewed: 06/23/2021 Elsevier Patient Education  [place] refers to food and lifestyle choices that are based on the traditions of countries located on the [employer]. It focuses on eating more fruits, vegetables, whole grains, beans, nuts, seeds, and heart-healthy fats, and eating less dairy, meat, eggs, and processed foods with added sugar, salt, and fat. This way of eating has been shown to help prevent certain conditions and improve outcomes for people who have chronic diseases, like kidney disease and heart disease. What are tips for following this plan? Reading food labels Check the serving size of packaged foods. For foods such as rice and pasta, the serving size refers to the amount of cooked product, not dry. Check the total fat in packaged foods. Avoid foods that have saturated fat or trans fats. Check the ingredient list for added sugars, such as corn syrup. Shopping  Buy a variety of foods that offer a balanced diet, including: Fresh fruits and vegetables (produce). Grains, beans, nuts, and seeds. Some of these may be  available in unpackaged forms or large amounts (in bulk). Fresh seafood. Poultry and eggs. Low-fat dairy products. Buy whole ingredients instead of prepackaged foods. Buy fresh fruits and vegetables in-season from local farmers markets. Buy plain frozen fruits and vegetables. If you do not have access to quality fresh seafood, buy precooked frozen shrimp or canned fish, such as tuna, salmon, or sardines. Stock your pantry so you always have certain foods on hand, such as olive oil, canned tuna, canned tomatoes, rice, pasta, and beans. Cooking Cook foods with extra-virgin olive oil instead of using butter or other vegetable oils. Have meat as a side dish, and have vegetables or grains as your main dish. This means having meat in  small portions or adding small amounts of meat to foods like pasta or stew. Use beans or vegetables instead of meat in common dishes like chili or lasagna. Experiment with different cooking methods. Try roasting, broiling, steaming, and sauting vegetables. Add frozen vegetables to soups, stews, pasta, or rice. Add nuts or seeds for added healthy fats and plant protein at each meal. You can add these to yogurt, salads, or vegetable dishes. Marinate fish or vegetables using olive oil, lemon juice, garlic, and fresh herbs. Meal planning Plan to eat one vegetarian meal one day each week. Try to work up to two vegetarian meals, if possible. Eat seafood two or more times a week. Have healthy snacks readily available, such as: Vegetable sticks with hummus. Greek yogurt. Fruit and nut trail mix. Eat balanced meals throughout the week. This includes: Fruit: 2-3 servings a day. Vegetables: 4-5 servings a day. Low-fat dairy: 2 servings a day. Fish, poultry, or lean meat: 1 serving a day. Beans and legumes: 2 or more servings a week. Nuts and seeds: 1-2 servings a day. Whole grains: 6-8 servings a day. Extra-virgin olive oil: 3-4 servings a day. Limit red meat and sweets to only a few servings a month. Lifestyle  Cook and eat meals together with your family, when possible. Drink enough fluid to keep your urine pale yellow. Be physically active every day. This includes: Aerobic exercise like running or swimming. Leisure activities like gardening, walking, or housework. Get 7-8 hours of sleep each night. If recommended by your health care provider, drink red wine in moderation. This means 1 glass a day for nonpregnant women and 2 glasses a day for men. A glass of wine equals 5 oz (150 mL). What foods should I eat? Fruits Apples. Apricots. Avocado. Berries. Bananas. Cherries. Dates. Figs. Grapes. Lemons. Melon. Oranges. Peaches. Plums. Pomegranate. Vegetables Artichokes. Beets. Broccoli.  Cabbage. Carrots. Eggplant. Green beans. Chard. Kale. Spinach. Onions. Leeks. Peas. Squash. Tomatoes. Peppers. Radishes. Grains Whole-grain pasta. Brown rice. Bulgur wheat. Polenta. Couscous. Whole-wheat bread. [place]. Meats and other proteins Beans. Almonds. Sunflower seeds. Pine nuts. Peanuts. [place]. Salmon. Scallops. Shrimp. [place]. Tilapia. Clams. Oysters. Eggs. Poultry without skin. Dairy Low-fat milk. Cheese. Greek yogurt. Fats and oils Extra-virgin olive oil. Avocado oil. Grapeseed oil. Beverages Water. Red wine. Herbal tea. Sweets and desserts Greek yogurt with honey. Baked apples. Poached pears. Trail mix. Seasonings and condiments Basil. Cilantro. Coriander. Cumin. Mint. Parsley. Sage. Rosemary. Tarragon. Garlic. Oregano. Thyme. Pepper. Balsamic vinegar. Tahini. Hummus. Tomato sauce. Olives. Mushrooms. The items listed above may not be a complete list of foods and beverages you can eat. Contact a dietitian for more information. What foods should I limit? This is a list of foods that should be eaten rarely or only on special occasions. Fruits Fruit canned in syrup.  Vegetables Deep-fried potatoes (french fries). Grains Prepackaged pasta or rice dishes. Prepackaged cereal with added sugar. Prepackaged snacks with added sugar. Meats and other proteins Beef. Pork. Lamb. Poultry with skin. Hot dogs. [place] Salines. Dairy Ice cream. Sour cream. Whole milk. Fats and oils Butter. Canola oil. Vegetable oil. Beef fat (tallow). Lard. Beverages Juice. Sugar-sweetened soft drinks. Beer. Liquor and spirits. Sweets and desserts Cookies. Cakes. Pies. Candy. Seasonings and condiments Mayonnaise. Pre-made sauces and marinades. The items listed above may not be a complete list of foods and beverages you should limit. Contact a dietitian for more information. Summary The Mediterranean diet includes both food and lifestyle choices. Eat a variety of fresh fruits and vegetables, beans, nuts,  seeds, and whole grains. Limit the amount of red meat and sweets that you eat. If recommended by your health care provider, drink red wine in moderation. This means 1 glass a day for nonpregnant women and 2 glasses a day for men. A glass of wine equals 5 oz (150 mL). This information is not intended to replace advice given to you by your health care provider. Make sure you discuss any questions you have with your health care provider. Document Revised: 01/04/2020 Document Reviewed: 11/01/2019 Elsevier Patient Education  [place].

## 2022-07-12 NOTE — Progress Notes (Signed)
Gynecology Annual Exam   PCP: Jodi Marble, MD  Chief Complaint:  Chief Complaint  Patient presents with   Annual Exam    History of Present Illness: Patient is a 36 y.o. [phone] presents for annual exam. The patient has complaint today of nearly constant vulvar itching and irritation. It is especially bad around the time of her cycle and is described as itching, redness, inflammation. She has a diagnosis of lichen sclerosis. She uses clobetasol about 2 days per week and alternates with Aquaphor. She does admit an increase in stress with job/life in the past year(s) and she has been trying to follow a more anti inflammatory lifestyle.   She has tachycardia managed by cardiologist with metoprolol   LMP: Patient's last menstrual period was 07/08/2022. Average Interval: regular, 28 days Duration of flow:  3-4  days Heavy Menses: first day Clots: no Intermenstrual Bleeding: no Postcoital Bleeding: no Dysmenorrhea: no  The patient is sexually active. She currently uses tubal ligation for contraception. She denies dyspareunia.  The patient does perform self breast exams.  There is notable family history of breast or ovarian cancer in her family. The patient is BRCA negative.  The patient wears seatbelts: yes.   The patient has regular exercise:  she has recently started working out; agility/cardio, she states diet is not bad/not great, she admits adequate hydration and sleep. She has cut back to 1 soda per week .    The patient denies current symptoms of depression. She has good coping techniques.  Review of Systems: Review of Systems  Constitutional:  Negative for chills and fever.  HENT:  Negative for congestion, ear discharge, ear pain, hearing loss, sinus pain and sore throat.   Eyes:  Negative for blurred vision and double vision.  Respiratory:  Negative for cough, shortness of breath and wheezing.   Cardiovascular:  Negative for chest pain, palpitations and leg  swelling.  Gastrointestinal:  Negative for abdominal pain, blood in stool, constipation, diarrhea, heartburn, melena, nausea and vomiting.  Genitourinary:  Negative for dysuria, flank pain, frequency, hematuria and urgency.       Positive for vulvar rash/itch/irritation  Musculoskeletal:  Negative for back pain, joint pain and myalgias.  Skin:  Positive for itching and rash.  Neurological:  Negative for dizziness, tingling, tremors, sensory change, speech change, focal weakness, seizures, loss of consciousness, weakness and headaches.  Endo/Heme/Allergies:  Negative for environmental allergies. Does not bruise/bleed easily.  Psychiatric/Behavioral:  Negative for depression, hallucinations, memory loss, substance abuse and suicidal ideas. The patient is not nervous/anxious and does not have insomnia.     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 Added automatically from request for surgery [id number]    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 01/09/2021    Formatting of this note might be different from the original. Added automatically from request for surgery [id number]    Lichen sclerosus 67/59/1638   Status post cesarean section 03/26/2016    Past Surgical History:  Past Surgical History:  Procedure Laterality Date   [place] N/A 03/26/2016   Procedure: CESAREAN SECTION WITH BILATERAL TUBAL LIGATION;  Surgeon: Will Bonnet, MD;  Location:  [facility] ORS;  Service: Obstetrics;  Laterality: N/A;   COLPOSCOPY     IUD REMOVAL      Gynecologic History:  Patient's last menstrual period was 07/08/2022. Contraception: tubal ligation Last Pap: 2020  Results were: no abnormalities   Obstetric History: [id number]  Family History:  Family History  Problem Relation Age of Onset   Hypercholesterolemia Mother    Diabetes Mellitus II Father    Hypertension Father    Throat cancer Paternal Grandfather    Heart attack Paternal Grandfather    Breast cancer Paternal Aunt     Social History:  Social History   Socioeconomic History   Marital status: Married    Spouse name: Not on file   Number of children: Not on file   Years of education: Not on file   Highest education level: Not on file  Occupational History   Not on file  Tobacco Use   Smoking status: Former   Smokeless tobacco: Never  Vaping Use   Vaping Use: Never used  Substance and Sexual Activity   Alcohol use: No   Drug use: No   Sexual activity: Yes    Birth control/protection: Surgical    Comment: tubal ligation  Other Topics Concern   Not on file  Social History Narrative   Not on file   Social Determinants of Health   Financial Resource Strain: Not on file  Food Insecurity: Not on file  Transportation Needs: Not on file  Physical Activity: Unknown (06/02/2018)   Exercise Vital Sign    Days of Exercise per Week: 2 days    Minutes of Exercise per Session: Not on file  Stress: Not on file  Social Connections: Not on file  Intimate Partner Violence: Not on file    Allergies:  No Known Allergies  Medications: Prior to Admission medications   Medication Sig Start Date End Date Taking? Authorizing Provider  ALPRAZolam Duanne Moron) 0.5 MG tablet Take 0.25 mg by mouth 2 (two) times daily. 06/07/22  Yes [provider]  amphetamine-dextroamphetamine (ADDERALL) 20 MG tablet Take 20 mg by mouth 2 (two) times daily. 06/08/21  Yes [provider]  clobetasol ointment (TEMOVATE) 0.05 % Apply to affected area twice daily for 2 weeks, then HS for 2 weeks and then M-W-F HS x 2 weeks 06/09/21  Yes Will Bonnet, MD  escitalopram (LEXAPRO) 10 MG tablet  Take 10 mg by mouth every morning. 06/30/22  Yes [provider]  metoprolol succinate (TOPROL-XL) 25 MG 24 hr tablet Take 25 mg by mouth daily. 06/04/22  Yes [provider]    Physical Exam Vitals: Blood pressure 120/80, height '5\' 7"'  (1.702 m), weight 192 lb (87.1 kg), last menstrual period 07/08/2022, unknown if currently breastfeeding.  General: NAD HEENT: normocephalic, anicteric Thyroid: no enlargement, no palpable nodules Pulmonary: No increased work of breathing, CTAB Cardiovascular: RRR, distal pulses 2+ Breast: Breast symmetrical, no tenderness, no palpable nodules or masses, no skin or nipple retraction present, no nipple discharge.  No axillary or supraclavicular lymphadenopathy. Abdomen: NABS, soft, non-tender, non-distended.  Umbilicus without lesions.  No hepatomegaly, splenomegaly or masses palpable. No evidence of hernia  Genitourinary:  External: complete Vulvar rash down to anal area  Vagina: Normal vaginal mucosa, no evidence of prolapse.   Extremities: no edema, erythema, or tenderness Neurologic: Grossly intact Psychiatric: mood appropriate, affect full   Assessment: 36 y.o. [id number] routine annual exam  Plan: Problem List Items Addressed This Visit   None Visit Diagnoses  Well woman exam with routine gynecological exam    -  Primary   Relevant Orders   Cervicovaginal ancillary only   Vulvar itching       Relevant Orders   Cervicovaginal ancillary only   Chronic vulvitis       Relevant Orders   Cervicovaginal ancillary only       1) STI screening  was offered and declined  2)  ASCCP guidelines and rationale discussed.  Patient opts for every 5 years screening interval  3) Contraception - the patient is currently using  tubal ligation.  She is happy with her current form of contraception and plans to continue  4) Routine healthcare maintenance including cholesterol, diabetes screening discussed managed by PCP  5) Vulvar  rash/lichen sclerosis: continue treatment with clobetasol as needed, increase anti inflammatory lifestyle, Apple Cider Vinegar tub soaks  6) Return in about 1 year (around 07/13/2023) for annual established gyn.   Rod Can, [facility] 07/12/2022, 9:52 AM

## 2022-07-13 LAB — CERVICOVAGINAL ANCILLARY ONLY
Bacterial Vaginitis (gardnerella): POSITIVE — AB
Candida Glabrata: NEGATIVE
Candida Vaginitis: NEGATIVE
Comment: NEGATIVE
Comment: NEGATIVE
Comment: NEGATIVE

## 2022-07-14 ENCOUNTER — Other Ambulatory Visit: Payer: Self-pay | Admitting: Advanced Practice Midwife

## 2022-07-14 DIAGNOSIS — B3731 Acute candidiasis of vulva and vagina: Secondary | ICD-10-CM

## 2022-07-14 DIAGNOSIS — N760 Acute vaginitis: Secondary | ICD-10-CM

## 2022-07-14 MED ORDER — FLUCONAZOLE 150 MG PO TABS: 150.0000 mg | ORAL_TABLET | Freq: Once | ORAL | 1 refills | Status: AC

## 2022-07-14 MED ORDER — METRONIDAZOLE 500 MG PO TABS: 500.0000 mg | ORAL_TABLET | Freq: Two times a day (BID) | ORAL | 0 refills | Status: AC

## 2022-07-14 NOTE — Progress Notes (Signed)
[facility] on lab. Rx's sent metronidazole and diflucan. Message to patient.

## 2022-11-23 ENCOUNTER — Telehealth: Payer: Self-pay

## 2022-11-23 NOTE — Telephone Encounter (Signed)
Pt calling for a refill on her steroid ointment; has requested refill via pharmacy but they haven't heard from [place].  [facility].  [phone]

## 2022-11-24 ENCOUNTER — Other Ambulatory Visit: Payer: Self-pay | Admitting: Advanced Practice Midwife

## 2022-11-24 DIAGNOSIS — L900 Lichen sclerosus et atrophicus: Secondary | ICD-10-CM

## 2022-11-24 MED ORDER — CLOBETASOL PROPIONATE 0.05 % EX OINT: TOPICAL_OINTMENT | CUTANEOUS | 2 refills | Status: DC

## 2022-11-24 NOTE — Progress Notes (Signed)
Rx clobetasol refilled

## 2022-11-25 NOTE — Telephone Encounter (Signed)
Left msg 'your refill has been sent in'.

## 2023-01-13 DIAGNOSIS — C50919 Malignant neoplasm of unspecified site of unspecified female breast: Secondary | ICD-10-CM

## 2023-01-13 HISTORY — DX: Malignant neoplasm of unspecified site of unspecified female breast: C50.919

## 2023-02-03 ENCOUNTER — Other Ambulatory Visit: Payer: Self-pay | Admitting: Internal Medicine

## 2023-02-04 ENCOUNTER — Other Ambulatory Visit: Payer: Self-pay | Admitting: Physician Assistant

## 2023-02-04 DIAGNOSIS — N63 Unspecified lump in unspecified breast: Secondary | ICD-10-CM

## 2023-02-04 MED ORDER — AMPHETAMINE-DEXTROAMPHETAMINE 20 MG PO TABS: 20.0000 mg | ORAL_TABLET | Freq: Two times a day (BID) | ORAL | 0 refills | Status: DC

## 2023-02-10 ENCOUNTER — Ambulatory Visit
Admission: RE | Admit: 2023-02-10 | Discharge: 2023-02-10 | Disposition: A | Discharge status: Home / Self Care | Payer: Managed Care, Other (non HMO) | Source: Ambulatory Visit | Attending: Physician Assistant | Admitting: Physician Assistant

## 2023-02-10 ENCOUNTER — Other Ambulatory Visit: Payer: Self-pay | Admitting: Physician Assistant

## 2023-02-10 ENCOUNTER — Other Ambulatory Visit: Payer: Self-pay | Admitting: Internal Medicine

## 2023-02-10 DIAGNOSIS — R590 Localized enlarged lymph nodes: Secondary | ICD-10-CM | POA: Insufficient documentation

## 2023-02-10 DIAGNOSIS — N63 Unspecified lump in unspecified breast: Secondary | ICD-10-CM

## 2023-02-10 DIAGNOSIS — D0512 Intraductal carcinoma in situ of left breast: Secondary | ICD-10-CM | POA: Diagnosis not present

## 2023-02-10 DIAGNOSIS — N6320 Unspecified lump in the left breast, unspecified quadrant: Secondary | ICD-10-CM | POA: Diagnosis not present

## 2023-02-10 DIAGNOSIS — R928 Other abnormal and inconclusive findings on diagnostic imaging of breast: Secondary | ICD-10-CM

## 2023-02-10 HISTORY — PX: BREAST BIOPSY: SHX20

## 2023-02-10 MED ORDER — LIDOCAINE HCL (PF) 1 % IJ SOLN
3.0000 mL | Freq: Once | INTRAMUSCULAR | Status: AC
  Administered 2023-02-10: 3 mL

## 2023-02-10 MED ORDER — LIDOCAINE-EPINEPHRINE 1 %-1:100000 IJ SOLN
8.0000 mL | Freq: Once | INTRAMUSCULAR | Status: AC
  Administered 2023-02-10: 8 mL

## 2023-02-11 ENCOUNTER — Encounter: Payer: Self-pay | Admitting: *Deleted

## 2023-02-11 DIAGNOSIS — C50919 Malignant neoplasm of unspecified site of unspecified female breast: Secondary | ICD-10-CM

## 2023-02-11 NOTE — Progress Notes (Signed)
Received referral for newly diagnosed breast cancer from [facility].  Navigation initiated. She will see Dr. Darrall Dears on Tuesday at 69.   She wants to go to [place] [facility] for surgeon, they are closed on Friday afternoons, will call first thing Monday and get her an appointment.

## 2023-02-14 ENCOUNTER — Encounter: Payer: Self-pay | Admitting: *Deleted

## 2023-02-14 ENCOUNTER — Encounter: Payer: Self-pay | Admitting: Internal Medicine

## 2023-02-14 NOTE — Progress Notes (Signed)
Cristina Baker has been scheduled with Dr. Lysle Pearl for tomorrow at 3:00.

## 2023-02-15 ENCOUNTER — Inpatient Hospital Stay: Payer: Managed Care, Other (non HMO)

## 2023-02-15 ENCOUNTER — Encounter: Payer: Self-pay | Admitting: *Deleted

## 2023-02-15 ENCOUNTER — Inpatient Hospital Stay: Payer: Managed Care, Other (non HMO) | Admitting: Internal Medicine

## 2023-02-15 NOTE — Progress Notes (Signed)
Ms. Sedam receptors still are not back.  Called pathology to see when they expect the results and they said by this afternoon.  Discussed with Dr. Darrall Dears.   Her appointment has been moved to Thursday.  Discussed with Dr. Lysle Pearl and he said he can still see her this afternoon.  Patient aware of appt. Changes.

## 2023-02-16 ENCOUNTER — Encounter: Payer: Self-pay | Admitting: *Deleted

## 2023-02-16 DIAGNOSIS — C50919 Malignant neoplasm of unspecified site of unspecified female breast: Secondary | ICD-10-CM

## 2023-02-16 LAB — SURGICAL PATHOLOGY

## 2023-02-17 ENCOUNTER — Inpatient Hospital Stay: Payer: Managed Care, Other (non HMO)

## 2023-02-17 ENCOUNTER — Inpatient Hospital Stay: Payer: Managed Care, Other (non HMO) | Attending: Internal Medicine | Admitting: Internal Medicine

## 2023-02-17 ENCOUNTER — Encounter: Payer: Self-pay | Admitting: *Deleted

## 2023-02-17 VITALS — BP 142/92 | HR 102 | Temp 98.6°F | Resp 18 | Ht 66.0 in | Wt 184.9 lb

## 2023-02-17 DIAGNOSIS — C50912 Malignant neoplasm of unspecified site of left female breast: Secondary | ICD-10-CM

## 2023-02-17 DIAGNOSIS — C773 Secondary and unspecified malignant neoplasm of axilla and upper limb lymph nodes: Secondary | ICD-10-CM | POA: Insufficient documentation

## 2023-02-17 DIAGNOSIS — Z803 Family history of malignant neoplasm of breast: Secondary | ICD-10-CM

## 2023-02-17 DIAGNOSIS — C50919 Malignant neoplasm of unspecified site of unspecified female breast: Secondary | ICD-10-CM | POA: Insufficient documentation

## 2023-02-17 DIAGNOSIS — Z8041 Family history of malignant neoplasm of ovary: Secondary | ICD-10-CM | POA: Diagnosis not present

## 2023-02-17 DIAGNOSIS — Z5111 Encounter for antineoplastic chemotherapy: Secondary | ICD-10-CM | POA: Diagnosis present

## 2023-02-17 DIAGNOSIS — Z79899 Other long term (current) drug therapy: Secondary | ICD-10-CM | POA: Insufficient documentation

## 2023-02-17 DIAGNOSIS — R002 Palpitations: Secondary | ICD-10-CM | POA: Diagnosis not present

## 2023-02-17 DIAGNOSIS — F419 Anxiety disorder, unspecified: Secondary | ICD-10-CM | POA: Insufficient documentation

## 2023-02-17 DIAGNOSIS — Z01818 Encounter for other preprocedural examination: Secondary | ICD-10-CM

## 2023-02-17 DIAGNOSIS — Z5189 Encounter for other specified aftercare: Secondary | ICD-10-CM | POA: Insufficient documentation

## 2023-02-17 DIAGNOSIS — Z171 Estrogen receptor negative status [ER-]: Secondary | ICD-10-CM | POA: Diagnosis not present

## 2023-02-17 DIAGNOSIS — F909 Attention-deficit hyperactivity disorder, unspecified type: Secondary | ICD-10-CM | POA: Diagnosis not present

## 2023-02-17 DIAGNOSIS — Z87891 Personal history of nicotine dependence: Secondary | ICD-10-CM

## 2023-02-17 NOTE — Progress Notes (Signed)
Accompanied patient and family to initial medical oncology appointment.   Reviewed Breast Cancer treatment handbook.   Care plan summary given to patient.   Reviewed outreach programs and cancer center services.   

## 2023-02-17 NOTE — Progress Notes (Signed)
Patient has questions for the provider.

## 2023-02-18 ENCOUNTER — Encounter: Payer: Self-pay | Admitting: Internal Medicine

## 2023-02-18 DIAGNOSIS — Z803 Family history of malignant neoplasm of breast: Secondary | ICD-10-CM | POA: Insufficient documentation

## 2023-02-18 DIAGNOSIS — Z01818 Encounter for other preprocedural examination: Secondary | ICD-10-CM | POA: Insufficient documentation

## 2023-02-18 NOTE — Progress Notes (Signed)
[place] CONSULT NOTE  Patient Care Team: Jodi Marble, MD as PCP - General (Internal Medicine) Daiva Huge, RN as Oncology Nurse Navigator  REFERRING PROVIDER: Dr. Lazarus Salines  REASON FOR REFFERAL: left breast cancer  CANCER STAGING   Cancer Staging  Breast cancer [facility]) Staging form: Breast, AJCC 8th Edition - Clinical: Stage IIB (cT2, cN1, cM0, G3, ER-, PR-, HER2+) - Signed by Jane Canary, MD on 02/17/2023 Histologic grading system: 3 grade system   ASSESSMENT & PLAN:  Cristina Baker 37 y.o. 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  # Left breast invasive ductal cancer, ER/PR negative, HER2 positive, stage IIb -Self detected left breast mass.  Status post biopsy on 02/10/2023.  -I discussed with the patient in detail about the imaging findings, biopsy results, staging, prognosis and management options.  Goal of treatment is curative.  Considering she has a T2 lesion with nodal involvement she will benefit from neoadjuvant chemotherapy with TCHP regimen every 3 weeks for total 6 cycles.  Side effects such as decreased blood count, need for blood transfusion, increased risk of infection, renal dysfunction, hair loss, fatigue, decreased appetite, nausea, vomiting, cardiac dysfunction, diarrhea, discussed.  I will obtain mammogram and ultrasound after 3 cycles to assess for response.  I will also obtain CT chest abdomen and pelvis with contrast and bone scan to complete the staging.  She will get echocardiogram prior to treatment to obtain baseline ejection fraction.  I reached out to Dr. Lysle Pearl who will schedule for port placement.  Chemo class will be scheduled.  After neoadjuvant chemotherapy, she will have surgery.  If she decides to proceed with lumpectomy she will benefit from radiation.  Depending on the surgical path report we  will discuss about further treatment.  If she does not have any residual disease we will complete Herceptin and Perjeta for total 1 year.  If there is residual disease we will discuss about TDM-1.  -Patient has completed family-planning.  She has 3 children and underwent tubal ligation.  Considering she is young I discussed about Lupron injections 11.25 mg every 3 months during chemo for ovarian hormone preservation.  # Encounter for echocardiogram prior to chemotherapy -Obtain baseline echo prior to starting Herceptin  # Family history of breast cancer -Scheduled for genetic next week  # Anxiety -Xanax 0.25 mg twice daily as needed  # History of palpitations  -Reportedly, she had workup with cardiology which was negative.  It was deemed secondary to panic. -On metoprolol.  # ADHD -on Adderall  Orders Placed This Encounter  Procedures   CBC with Differential ([place] Only)    Standing Status:   Future    Standing Expiration Date:   03/06/2024   CMP ([place] only)    Standing Status:   Future    Standing Expiration Date:   03/06/2024   RTC in 3 weeks for MD visit, labs, cycle 1 TCHP  The total time spent in the appointment was 60 minutes encounter with patients including review of chart and various tests results, discussions about plan of care and coordination of care plan   All questions were answered. The patient knows to call the clinic with any problems, questions or concerns. No barriers to learning was detected.  Jane Canary, MD 3/8/20249:15 AM   HISTORY OF PRESENTING ILLNESS:  Cristina Baker 37 y.o. 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  Patient was seen today accompanied by husband.  She denies any pain or tenderness in the breast.  Denies any discharge.  There is family history of breast cancer in paternal  grandmother and 2 aunts.  She had a BRCA testing about 15 years ago which was negative.  She has completed family-planning.  Underwent tubal ligation.  She has 3 children 1 in 47s second in his teen and the youngest is 39 years old.    I have reviewed her chart and materials related to her cancer extensively and collaborated history with the patient. Summary of oncologic history is as follows: Oncology History  Breast cancer ([place])  01/26/2023 Initial Diagnosis   Patient felt palpable lump in left breast   02/10/2023 Mammogram     IMPRESSION: 1. There is a suspicious 24 mm mass at the site of palpable concern which is concerning for malignancy. It demonstrates 3 cm of associated calcifications. Recommend ultrasound-guided biopsy for definitive characterization with attention on post marker placement mammogram to assess for extent of calcifications in relation to the biopsy marking clip. 2. There is an indeterminate LEFT axillary lymph node. Recommend ultrasound-guided biopsy for definitive characterization. 3. No mammographic evidence of malignancy in the RIGHT breast.   02/10/2023 Pathology Results   DIAGNOSIS: A.  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  Size of invasive carcinoma: 1.7 cm in linear length in this sample Histologic grade of invasive carcinoma: Grade 3 Glandular/tubular differentiation score: 3 Nuclear pleomorphism score: 3 Mitotic rate score: 2 Total score: 8 Ductal carcinoma in situ: Present, high-grade Lymphovascular invasion: Indeterminate See comment.  B.  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   Estrogen Receptor (ER) Status: NEGATIVE (LESS THAN 1%)         Internal control cells positive Progesterone Receptor (PgR) Status: Negative HER2 (by immunohistochemistry): Positive Ki-67: Not performed    02/17/2023 Cancer Staging    Staging form: Breast, AJCC 8th Edition - Clinical: Stage IIB (cT2, cN1, cM0, G3, ER-, PR-, HER2+) - Signed by Jane Canary, MD on 02/17/2023 Histologic grading system: 3 grade system     MEDICAL HISTORY:  Past Medical History:  Diagnosis Date   Abnormal Pap smear of cervix    BRCA negative 2015   MyRisk neg; IBIS=18/8%   Family history of breast cancer 2015   IBIS=18.8%   Family history of ovarian cancer    Herpes genitalis    PONV (postoperative nausea and vomiting)    nausea   Tachycardia, unspecified     SURGICAL HISTORY: Past Surgical History:  Procedure Laterality Date   BREAST BIOPSY Left 02/10/2023   [place] Core Bx, Coil Clip - path pending   BREAST BIOPSY Left 02/10/2023   [place] Core Hydromark clip path pending   BREAST BIOPSY Left 02/10/2023   [place] LT BREAST BX W LOC DEV 1ST LESION IMG BX SPEC US GUIDE 02/10/2023 [facility]   CESAREAN SECTION     CESAREAN SECTION WITH BILATERAL TUBAL LIGATION N/A 03/26/2016   Procedure: CESAREAN SECTION WITH BILATERAL TUBAL LIGATION;  Surgeon: Will Bonnet, MD;  Location: [facility] ORS;  Service: Obstetrics;  Laterality: N/A;   COLPOSCOPY     IUD REMOVAL      SOCIAL HISTORY: Social History   Socioeconomic History  Marital status: Married    Spouse name: Not on file   Number of children: Not on file   Years of education: Not on file   Highest education level: Not on file  Occupational History   Not on file  Tobacco Use   Smoking status: Former   Smokeless tobacco: Never  Vaping Use   Vaping Use: Never used  Substance and Sexual Activity   Alcohol use: No   Drug use: No   Sexual activity: Yes    Birth control/protection: Surgical    Comment: tubal ligation  Other Topics Concern   Not on file  Social History Narrative   Not on file   Social Determinants of Health   Financial Resource Strain: Not on file  Food Insecurity: Unknown (02/17/2023)   Hunger Vital Sign    Worried About Running Out of Food in the Last  Year: Never true    Ran Out of Food in the Last Year: Not on file  Transportation Needs: No Transportation Needs (02/17/2023)   PRAPARE - Hydrologist (Medical): No    Lack of Transportation (Non-Medical): No  Physical Activity: Unknown (06/02/2018)   Exercise Vital Sign    Days of Exercise per Week: 2 days    Minutes of Exercise per Session: Not on file  Stress: Not on file  Social Connections: Not on file  Intimate Partner Violence: Not At Risk (02/17/2023)   Humiliation, Afraid, Rape, and Kick questionnaire    Fear of Current or Ex-Partner: No    Emotionally Abused: No    Physically Abused: No    Sexually Abused: No    FAMILY HISTORY: Family History  Problem Relation Age of Onset   Hypercholesterolemia Mother    Diabetes Mellitus II Father    Hypertension Father    Breast cancer Paternal Aunt 38   Breast cancer Paternal Aunt 45   Breast cancer Paternal Grandmother    Throat cancer Paternal Grandfather    Heart attack Paternal Grandfather     ALLERGIES:  has No Known Allergies.  MEDICATIONS:  Current Outpatient Medications  Medication Sig Dispense Refill   ALPRAZolam (XANAX) 0.5 MG tablet Take 0.25 mg by mouth 2 (two) times daily.     amphetamine-dextroamphetamine (ADDERALL) 20 MG tablet Take 1 tablet (20 mg total) by mouth 2 (two) times daily. 60 tablet 0   clobetasol ointment (TEMOVATE) 0.05 % Apply to affected area twice daily for 2 weeks, then HS for 2 weeks and then M-W-F HS x 2 weeks 30 g 2   escitalopram (LEXAPRO) 10 MG tablet Take 10 mg by mouth every morning.     metoprolol succinate (TOPROL-XL) 25 MG 24 hr tablet Take 25 mg by mouth daily.     No current facility-administered medications for this visit.    REVIEW OF SYSTEMS:   Pertinent information mentioned in HPI All other systems were reviewed with the patient and are negative.  PHYSICAL EXAMINATION: ECOG PERFORMANCE STATUS: 0 - Asymptomatic  Vitals:   02/17/23 1436  BP:  (!) 142/92  Pulse: (!) 102  Resp: 18  Temp: 98.6 F (37 C)  SpO2: 100%   Filed Weights   02/17/23 1436  Weight: 184 lb 14.4 oz (83.9 kg)    GENERAL:alert, no distress and comfortable SKIN: skin color, texture, turgor are normal, no rashes or significant lesions EYES: normal, conjunctiva are pink and non-injected, sclera clear OROPHARYNX:no exudate, no erythema and lips, buccal mucosa, and tongue normal  NECK: supple,  thyroid normal size, non-tender, without nodularity LYMPH:  no palpable lymphadenopathy in the cervical, axillary or inguinal LUNGS: clear to auscultation and percussion with normal breathing effort HEART: regular rate & rhythm and no murmurs and no lower extremity edema ABDOMEN:abdomen soft, non-tender and normal bowel sounds Musculoskeletal:no cyanosis of digits and no clubbing  PSYCH: alert & oriented x 3 with fluent speech NEURO: no focal motor/sensory deficits  LABORATORY DATA:  I have reviewed the data as listed Lab Results  Component Value Date   WBC 9.5 06/01/2022   HGB 12.3 06/01/2022   HCT 37.8 06/01/2022   MCV 83.1 06/01/2022   PLT 390 06/01/2022   Recent Labs    03/19/22 1458 04/12/22 2334 06/01/22 1556  NA 139 138 137  K 3.9 3.4* 3.0*  CL 106 106 107  CO2 '22 26 24  '$ GLUCOSE 104* 101* 121*  BUN '13 15 12  '$ CREATININE 0.67 0.64 0.46  CALCIUM 9.6 9.5 9.2  GFRNONAA >60 >60 >60    RADIOGRAPHIC STUDIES: I have personally reviewed the radiological images as listed and agreed with the findings in the report. [place] LT BREAST BX W LOC DEV 1ST LESION IMG BX SPEC US GUIDE  Addendum Date: 02/11/2023   ADDENDUM REPORT: 02/11/2023 14:44 ADDENDUM: PATHOLOGY revealed: Site A. 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 Size of invasive carcinoma: 1.7 cm in linear length in this sample. Grade 3. Ductal carcinoma in situ: Present, high-grade. Lymphovascular invasion: Indeterminate Pathology results  are CONCORDANT with imaging findings, per Dr. Valentino Saxon. PATHOLOGY revealed: Site B. 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 - SEE COMMENT. Pathology results are CONCORDANT with imaging findings, per Dr. Valentino Saxon. Pathology results and recommendations below were discussed with patient by telephone on 02/11/2023. Patient reported biopsy site within normal limits with slight tenderness at the site. Post biopsy care instructions were reviewed, questions were answered and my direct phone number was provided to patient. Patient was instructed to call [facility] if any concerns or questions arise related to the biopsy. RECOMMENDATIONS: 1. Surgical and oncological consultation. Request for surgical and oncological consultation relayed to Casper Harrison RN at [facility] by Electa Sniff RN on 02/11/2023. 2. Consider pretreatment bilateral breast MRI with and without contrast given patient's relatively young age and to evaluate extent of breast disease. Pathology results reported by Electa Sniff RN on 02/11/2023. Electronically Signed   By: Valentino Saxon M.D.   On: 02/11/2023 14:44   Result Date: 02/11/2023 CLINICAL DATA:  Suspicious palpable LEFT breast mass. Enlarged LEFT axillary lymph node EXAM: ULTRASOUND GUIDED LEFT BREAST CORE NEEDLE BIOPSY [place] AXILLARY NODE CORE BIOPSY LEFT COMPARISON:  Previous exam(s). PROCEDURE: I met with the patient and we discussed the procedure of ultrasound-guided biopsy, including benefits and alternatives. We discussed the high likelihood of a successful procedure. We discussed the risks of the procedure, including infection, bleeding, tissue injury, clip migration, and inadequate sampling. Informed written consent was given. The usual time-out protocol was performed immediately prior to the procedure. Site 1: LEFT breast 2 o'clock 5 cm from the nipple Lesion quadrant: Upper outer  quadrant Using sterile technique and 1% lidocaine and 1% lidocaine with epinephrine as local anesthetic, under direct ultrasound visualization, a 14 gauge spring-loaded device was used to perform biopsy of a mass at 2 o'clock using a lateral approach. At the conclusion of the procedure a COIL shaped tissue marker  clip was deployed into the biopsy cavity. Follow up 2 view mammogram was performed and dictated separately. Site 2: LEFT axillary lymph node Lesion quadrant: Upper outer quadrant Using sterile technique and 1% lidocaine and 1% lidocaine with epinephrine as local anesthetic, under direct ultrasound visualization, a 14 gauge spring-loaded device was used to perform biopsy of LEFT axillary lymph node using an inferior approach. At the conclusion of the procedure a HYDROMARK shaped tissue marker clip was deployed into the biopsy cavity. Follow up 2 view mammogram was performed and dictated separately. IMPRESSION: 1. Ultrasound guided biopsy of a suspicious LEFT breast mass and enlarged LEFT axillary lymph node. No apparent complications. 2. With malignant results, recommend pre treatment breast MRI with and without contrast. 3. Of note, suspicious LEFT breast mass demonstrates associated calcifications which span approximately 3 cm. These appear to be within 1 cm of the margins of the suspicious mass. If breast conservation surgery is pursued, recommend complete excision of these calcifications. Electronically Signed: By: Valentino Saxon M.D. On: 02/10/2023 12:53   [place] AXILLARY NODE CORE BIOPSY LEFT  Addendum Date: 02/11/2023   ADDENDUM REPORT: 02/11/2023 14:44 ADDENDUM: PATHOLOGY revealed: Site A. 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 Size of invasive carcinoma: 1.7 cm in linear length in this sample. Grade 3. Ductal carcinoma in situ: Present, high-grade. Lymphovascular invasion: Indeterminate Pathology results are CONCORDANT with  imaging findings, per Dr. Valentino Saxon. PATHOLOGY revealed: Site B. 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 - SEE COMMENT. Pathology results are CONCORDANT with imaging findings, per Dr. Valentino Saxon. Pathology results and recommendations below were discussed with patient by telephone on 02/11/2023. Patient reported biopsy site within normal limits with slight tenderness at the site. Post biopsy care instructions were reviewed, questions were answered and my direct phone number was provided to patient. Patient was instructed to call [facility] if any concerns or questions arise related to the biopsy. RECOMMENDATIONS: 1. Surgical and oncological consultation. Request for surgical and oncological consultation relayed to Casper Harrison RN at [facility] by Electa Sniff RN on 02/11/2023. 2. Consider pretreatment bilateral breast MRI with and without contrast given patient's relatively young age and to evaluate extent of breast disease. Pathology results reported by Electa Sniff RN on 02/11/2023. Electronically Signed   By: Valentino Saxon M.D.   On: 02/11/2023 14:44   Result Date: 02/11/2023 CLINICAL DATA:  Suspicious palpable LEFT breast mass. Enlarged LEFT axillary lymph node EXAM: ULTRASOUND GUIDED LEFT BREAST CORE NEEDLE BIOPSY [place] AXILLARY NODE CORE BIOPSY LEFT COMPARISON:  Previous exam(s). PROCEDURE: I met with the patient and we discussed the procedure of ultrasound-guided biopsy, including benefits and alternatives. We discussed the high likelihood of a successful procedure. We discussed the risks of the procedure, including infection, bleeding, tissue injury, clip migration, and inadequate sampling. Informed written consent was given. The usual time-out protocol was performed immediately prior to the procedure. Site 1: LEFT breast 2 o'clock 5 cm from the nipple Lesion quadrant: Upper outer quadrant Using sterile  technique and 1% lidocaine and 1% lidocaine with epinephrine as local anesthetic, under direct ultrasound visualization, a 14 gauge spring-loaded device was used to perform biopsy of a mass at 2 o'clock using a lateral approach. At the conclusion of the procedure a COIL shaped tissue marker clip was deployed into the biopsy cavity. Follow up 2 view mammogram was performed and dictated separately. Site 2: LEFT  axillary lymph node Lesion quadrant: Upper outer quadrant Using sterile technique and 1% lidocaine and 1% lidocaine with epinephrine as local anesthetic, under direct ultrasound visualization, a 14 gauge spring-loaded device was used to perform biopsy of LEFT axillary lymph node using an inferior approach. At the conclusion of the procedure a HYDROMARK shaped tissue marker clip was deployed into the biopsy cavity. Follow up 2 view mammogram was performed and dictated separately. IMPRESSION: 1. Ultrasound guided biopsy of a suspicious LEFT breast mass and enlarged LEFT axillary lymph node. No apparent complications. 2. With malignant results, recommend pre treatment breast MRI with and without contrast. 3. Of note, suspicious LEFT breast mass demonstrates associated calcifications which span approximately 3 cm. These appear to be within 1 cm of the margins of the suspicious mass. If breast conservation surgery is pursued, recommend complete excision of these calcifications. Electronically Signed: By: Valentino Saxon M.D. On: 02/10/2023 12:53   MM CLIP PLACEMENT LEFT  Result Date: 02/10/2023 CLINICAL DATA:  Status post 2 site ultrasound-guided biopsy EXAM: 3D DIAGNOSTIC LEFT MAMMOGRAM POST ULTRASOUND BIOPSY COMPARISON:  Previous exam(s). FINDINGS: 3D Mammographic images were obtained following ultrasound guided biopsy of a LEFT breast mass. The COIL biopsy marking clip is in expected position at the site of biopsy. Associated calcifications are within 1 cm of the margins of the mass. 3D Mammographic images  were obtained following ultrasound guided biopsy of a LEFT axillary lymph node. [facility] biopsy marking clip is in expected position at the site of biopsy. It is at the site of enlarged LEFT axillary lymph node noted mammographically. IMPRESSION: 1. Appropriate positioning of the COIL shaped biopsy marking clip at the site of biopsy in the upper breast. Associated suspicious calcifications are within 1 cm of the margins of the suspicious mass. If breast conservation surgery is pursued, recommend complete excision of these associated calcifications. 2. Appropriate positioning of the HYDROMARK biopsy marking clip the site of biopsy in the LEFT axilla. This corresponds to the enlarged LEFT axillary lymph node noted mammographically. Final Assessment: Post Procedure Mammograms for Marker Placement Electronically Signed   By: Valentino Saxon M.D.   On: 02/10/2023 12:44  MM DIAG BREAST TOMO BILATERAL  Result Date: 02/10/2023 CLINICAL DATA:  LEFT breast palpable mass for 10 days. EXAM: DIGITAL DIAGNOSTIC BILATERAL MAMMOGRAM WITH TOMOSYNTHESIS; ULTRASOUND LEFT BREAST LIMITED TECHNIQUE: Bilateral digital diagnostic mammography and breast tomosynthesis was performed.; Targeted ultrasound examination of the left breast was performed. COMPARISON:  None available. ACR Breast Density Category c: The breasts are heterogeneously dense, which may obscure small masses. FINDINGS: Spot compression tomosynthesis views were obtained of the site of palpable concern. Subjacent to the site of palpable concern, there is an irregular mass with associated pleomorphic calcifications. Associated calcifications span approximately 3 cm and are associated predominately along the lateral and superior aspect of the mass. There is a prominent LEFT axillary lymph node noted on MLO view. No suspicious mass, distortion, or microcalcifications are identified to suggest presence of malignancy in the RIGHT breast. On physical exam, there is a  hard mass in the upper breast. Targeted ultrasound was performed of the LEFT breast. At 2 o'clock 5 cm from nipple, there is an irregular hypoechoic mass with indistinct margins. It measures 24 x 18 x 16 mm. It demonstrates multiple echogenic foci consistent with calcifications noted mammographically. Targeted ultrasound was performed of the LEFT axilla. There is a single enlarged LEFT axillary lymph node with cortical thickening of approximately 7 mm. IMPRESSION: 1. There is a suspicious  24 mm mass at the site of palpable concern which is concerning for malignancy. It demonstrates 3 cm of associated calcifications. Recommend ultrasound-guided biopsy for definitive characterization with attention on post marker placement mammogram to assess for extent of calcifications in relation to the biopsy marking clip. 2. There is an indeterminate LEFT axillary lymph node. Recommend ultrasound-guided biopsy for definitive characterization. 3. No mammographic evidence of malignancy in the RIGHT breast. RECOMMENDATION: LEFT breast ultrasound-guided biopsy x1 LEFT axillary ultrasound-guided biopsy x1 I have discussed the findings and recommendations with the patient. The biopsy procedure was discussed with the patient and questions were answered. Patient expressed their understanding of the biopsy recommendation. Patient will be scheduled for biopsy today. Ordering provider will be notified. If applicable, a reminder letter will be sent to the patient regarding the next appointment. BI-RADS CATEGORY  5: Highly suggestive of malignancy. Electronically Signed   By: Valentino Saxon M.D.   On: 02/10/2023 10:14  US BREAST LTD UNI LEFT INC AXILLA  Result Date: 02/10/2023 CLINICAL DATA:  LEFT breast palpable mass for 10 days. EXAM: DIGITAL DIAGNOSTIC BILATERAL MAMMOGRAM WITH TOMOSYNTHESIS; ULTRASOUND LEFT BREAST LIMITED TECHNIQUE: Bilateral digital diagnostic mammography and breast tomosynthesis was performed.; Targeted  ultrasound examination of the left breast was performed. COMPARISON:  None available. ACR Breast Density Category c: The breasts are heterogeneously dense, which may obscure small masses. FINDINGS: Spot compression tomosynthesis views were obtained of the site of palpable concern. Subjacent to the site of palpable concern, there is an irregular mass with associated pleomorphic calcifications. Associated calcifications span approximately 3 cm and are associated predominately along the lateral and superior aspect of the mass. There is a prominent LEFT axillary lymph node noted on MLO view. No suspicious mass, distortion, or microcalcifications are identified to suggest presence of malignancy in the RIGHT breast. On physical exam, there is a hard mass in the upper breast. Targeted ultrasound was performed of the LEFT breast. At 2 o'clock 5 cm from nipple, there is an irregular hypoechoic mass with indistinct margins. It measures 24 x 18 x 16 mm. It demonstrates multiple echogenic foci consistent with calcifications noted mammographically. Targeted ultrasound was performed of the LEFT axilla. There is a single enlarged LEFT axillary lymph node with cortical thickening of approximately 7 mm. IMPRESSION: 1. There is a suspicious 24 mm mass at the site of palpable concern which is concerning for malignancy. It demonstrates 3 cm of associated calcifications. Recommend ultrasound-guided biopsy for definitive characterization with attention on post marker placement mammogram to assess for extent of calcifications in relation to the biopsy marking clip. 2. There is an indeterminate LEFT axillary lymph node. Recommend ultrasound-guided biopsy for definitive characterization. 3. No mammographic evidence of malignancy in the RIGHT breast. RECOMMENDATION: LEFT breast ultrasound-guided biopsy x1 LEFT axillary ultrasound-guided biopsy x1 I have discussed the findings and recommendations with the patient. The biopsy procedure was  discussed with the patient and questions were answered. Patient expressed their understanding of the biopsy recommendation. Patient will be scheduled for biopsy today. Ordering provider will be notified. If applicable, a reminder letter will be sent to the patient regarding the next appointment. BI-RADS CATEGORY  5: Highly suggestive of malignancy. Electronically Signed   By: Valentino Saxon M.D.   On: 02/10/2023 10:14

## 2023-02-18 NOTE — Progress Notes (Signed)
START ON PATHWAY REGIMEN - Breast     Cycle 1: A cycle is 21 days:     Pertuzumab      Trastuzumab-xxxx      Docetaxel      Carboplatin    Cycles 2 through 6: A cycle is every 21 days:     Pertuzumab      Trastuzumab-xxxx      Docetaxel      Carboplatin   **Always confirm dose/schedule in your pharmacy ordering system**  Patient Characteristics: Preoperative or Nonsurgical Candidate (Clinical Staging), Neoadjuvant Therapy followed by Surgery, Invasive Disease, Chemotherapy, HER2 Positive, ER Negative Therapeutic Status: Preoperative or Nonsurgical Candidate (Clinical Staging) AJCC M Category: cM0 AJCC Grade: G3 Breast Surgical Plan: Neoadjuvant Therapy followed by Surgery ER Status: Negative (-) AJCC 8 Stage Grouping: IIB HER2 Status: Positive (+) AJCC T Category: cT2 AJCC N Category: cN1 PR Status: Negative (-) Intent of Therapy: Curative Intent, Discussed with Patient 

## 2023-02-18 NOTE — Addendum Note (Signed)
Addended byJane Canary on: 02/18/2023 10:26 AM   Modules accepted: Orders

## 2023-02-21 ENCOUNTER — Encounter
Admission: RE | Admit: 2023-02-21 | Discharge: 2023-02-21 | Disposition: A | Discharge status: Home / Self Care | Payer: Managed Care, Other (non HMO) | Source: Ambulatory Visit | Attending: Internal Medicine | Admitting: Internal Medicine

## 2023-02-21 DIAGNOSIS — C50912 Malignant neoplasm of unspecified site of left female breast: Secondary | ICD-10-CM | POA: Diagnosis not present

## 2023-02-21 DIAGNOSIS — Z171 Estrogen receptor negative status [ER-]: Secondary | ICD-10-CM | POA: Insufficient documentation

## 2023-02-21 MED ORDER — TECHNETIUM TC 99M MEDRONATE IV KIT
20.0000 | PACK | Freq: Once | INTRAVENOUS | Status: AC | PRN
  Administered 2023-02-21: 21.39 via INTRAVENOUS

## 2023-02-22 ENCOUNTER — Encounter: Payer: Self-pay | Admitting: Internal Medicine

## 2023-02-22 ENCOUNTER — Ambulatory Visit: Payer: Self-pay | Admitting: Surgery

## 2023-02-22 ENCOUNTER — Ambulatory Visit: Payer: Managed Care, Other (non HMO) | Admitting: Internal Medicine

## 2023-02-22 ENCOUNTER — Encounter: Payer: Self-pay | Admitting: *Deleted

## 2023-02-22 VITALS — BP 122/88 | HR 102 | Ht 66.0 in | Wt 185.4 lb

## 2023-02-22 DIAGNOSIS — F419 Anxiety disorder, unspecified: Secondary | ICD-10-CM | POA: Insufficient documentation

## 2023-02-22 DIAGNOSIS — F909 Attention-deficit hyperactivity disorder, unspecified type: Secondary | ICD-10-CM | POA: Insufficient documentation

## 2023-02-22 DIAGNOSIS — E782 Mixed hyperlipidemia: Secondary | ICD-10-CM | POA: Diagnosis not present

## 2023-02-22 NOTE — H&P (View-Only) (Signed)
Subjective:   CC: Malignant neoplasm of upper-outer quadrant of left female breast, unspecified estrogen receptor status (CMS-HCC) [C50.412] HPI: Cristina Baker is a 37 y.o. female who was referred by Althea Grimmer, PA for evaluation of above. Change was noted on mammogram after palpable mass. Patient does not routinely do self breast exams. Patient has noted a change on breast exam. Patient denies previous breast biopsy. Patient denies a personal history of breast cancer.  Tested for genetic cancer in the past negative due to family history  Past Medical History: has no past medical history on file.  Past Surgical History: has no past surgical history on file.  Family History: two paternal aunts and paternal grandmother with breast CA  Social History: reports that she has never smoked. She has never been exposed to tobacco smoke. She has never used smokeless tobacco. No history on file for alcohol use and drug use.  Current Medications: has a current medication list which includes the following prescription(s): alprazolam, clobetasol, dextroamphetamine-amphetamine, escitalopram oxalate, and metoprolol succinate.  Allergies:  Allergies as of 02/15/2023  (No Known Allergies)   ROS:  A 15 point review of systems was performed and was negative except as noted in HPI  Objective:    BP (!) 139/91  Pulse 107  Ht 167.6 cm ('5\' 6"'$ )  Wt 82.1 kg (181 lb)  BMI 29.21 kg/m   Constitutional : No distress, cooperative, alert  Lymphatics/Throat: Supple with no lymphadenopathy  Respiratory: Clear to auscultation bilaterally  Cardiovascular: Regular rate and rhythm  Gastrointestinal: Soft, non-tender, non-distended, no organomegaly.  Musculoskeletal: Steady gait and movement  Skin: Cool and moist  Psychiatric: Normal affect, non-agitated, not confused  Breast: Normal appearance and no palpable abnormality in bilateral breasts and axilla. Chaperone present for exam.    LABS:  Addendum by  Verdell Face, MD on 02/11/2023 4:44 PM EST  ADDENDUM REPORT: 02/11/2023 14:44   ADDENDUM:  PATHOLOGY revealed: Site A. 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  Size of invasive carcinoma: 1.7 cm in linear length in this sample.  Grade 3. Ductal carcinoma in situ: Present, high-grade.  Lymphovascular invasion: Indeterminate   Pathology results are CONCORDANT with imaging findings, per Dr.  Valentino Saxon.   PATHOLOGY revealed: Site   B. LYMPH NODE, LEFT AXILLA; STEREOTACTIC CORE BIOPSY: - INVOLVED BY  INVASIVE MAMMARY CARCINOMA, NO SPECIAL TYPE. - SEE COMMENT.   Pathology results are CONCORDANT with imaging findings, per Dr.  Valentino Saxon.   Pathology results and recommendations below were discussed with  patient by telephone on 02/11/2023. Patient reported biopsy site  within normal limits with slight tenderness at the site. Post biopsy  care instructions were reviewed, questions were answered and my  direct phone number was provided to patient. Patient was instructed  to call [facility] if any concerns or questions arise  related to the biopsy.   RECOMMENDATIONS: 1. Surgical and oncological consultation. Request  for surgical and oncological consultation relayed to Casper Harrison RN at  [facility] by Electa Sniff RN on 02/11/2023.   2. Consider pretreatment bilateral breast MRI with and without  contrast given patient's relatively young age and to evaluate extent  of breast disease.   Pathology results reported by Electa Sniff RN on 02/11/2023.   Electronically Signed  By: Valentino Saxon M.D.  On: 02/11/2023 14:44   RADS: CLINICAL DATA: LEFT breast palpable mass for 10 days.   EXAM:  DIGITAL DIAGNOSTIC BILATERAL  MAMMOGRAM WITH TOMOSYNTHESIS;  ULTRASOUND LEFT BREAST LIMITED   TECHNIQUE:  Bilateral digital diagnostic mammography and breast tomosynthesis  was  performed.; Targeted ultrasound examination of the left breast  was performed.   COMPARISON: None available.   ACR Breast Density Category c: The breasts are heterogeneously  dense, which may obscure small masses.   FINDINGS:  Spot compression tomosynthesis views were obtained of the site of  palpable concern. Subjacent to the site of palpable concern, there  is an irregular mass with associated pleomorphic calcifications.  Associated calcifications span approximately 3 cm and are associated  predominately along the lateral and superior aspect of the mass.  There is a prominent LEFT axillary lymph node noted on MLO view.   No suspicious mass, distortion, or microcalcifications are  identified to suggest presence of malignancy in the RIGHT breast.   On physical exam, there is a hard mass in the upper breast.   Targeted ultrasound was performed of the LEFT breast. At 2 o'clock 5  cm from nipple, there is an irregular hypoechoic mass with  indistinct margins. It measures 24 x 18 x 16 mm. It demonstrates  multiple echogenic foci consistent with calcifications noted  mammographically.   Targeted ultrasound was performed of the LEFT axilla. There is a  single enlarged LEFT axillary lymph node with cortical thickening of  approximately 7 mm.   IMPRESSION:  1. There is a suspicious 24 mm mass at the site of palpable concern  which is concerning for malignancy. It demonstrates 3 cm of  associated calcifications. Recommend ultrasound-guided biopsy for  definitive characterization with attention on post marker placement  mammogram to assess for extent of calcifications in relation to the  biopsy marking clip.  2. There is an indeterminate LEFT axillary lymph node. Recommend  ultrasound-guided biopsy for definitive characterization.  3. No mammographic evidence of malignancy in the RIGHT breast.   RECOMMENDATION:  LEFT breast ultrasound-guided biopsy x1   LEFT axillary  ultrasound-guided biopsy x1   I have discussed the findings and recommendations with the patient.  The biopsy procedure was discussed with the patient and questions  were answered. Patient expressed their understanding of the biopsy  recommendation. Patient will be scheduled for biopsy today. Ordering  provider will be notified. If applicable, a reminder letter will be  sent to the patient regarding the next appointment.   BI-RADS CATEGORY 5: Highly suggestive of malignancy.   Electronically Signed  By: Valentino Saxon M.D.  On: 02/10/2023 10:14   Assessment:   Malignant neoplasm of upper-outer quadrant of left female breast, unspecified estrogen receptor status (CMS-HCC) [C50.412]  Plan:    1. Malignant neoplasm of upper-outer quadrant of left female breast, unspecified estrogen receptor status (CMS-HCC) [C50.412] She is open to all options at this point. Likely additional imaging first, then neoadjuvant treatment prior to any surgery. already had discussion about port placement so as soon as she decides course, we can start scheduling from surgery standpoint.  Risk alternative benefits discussed for port placement. Risks include bleeding, infection, dislocation, migration, malfunction, unsuccessful placement, pneumothorax, and additional procedures to address that risks. Benefits include initiation of chemotherapy. Alternative includes non-IV infusion therapy.  We discussed the need for the port to infuse IV chemotherapy agents, and how the port can be a temporary and/or permanent option depending on patient preference after completion of chemotherapy. Port will be okay to use as soon as it is placed.  Patient verbalized understanding and all questions and concerns addressed.  labs/images/medications/previous chart entries  reviewed personally and relevant changes/updates noted above.

## 2023-02-22 NOTE — H&P (Signed)
Subjective:   CC: Malignant neoplasm of upper-outer quadrant of left female breast, unspecified estrogen receptor status (CMS-HCC) [C50.412] HPI: Saralee Gentzel is a 37 y.o. female who was referred by Althea Grimmer, PA for evaluation of above. Change was noted on mammogram after palpable mass. Patient does not routinely do self breast exams. Patient has noted a change on breast exam. Patient denies previous breast biopsy. Patient denies a personal history of breast cancer.  Tested for genetic cancer in the past negative due to family history  Past Medical History: has no past medical history on file.  Past Surgical History: has no past surgical history on file.  Family History: two paternal aunts and paternal grandmother with breast CA  Social History: reports that she has never smoked. She has never been exposed to tobacco smoke. She has never used smokeless tobacco. No history on file for alcohol use and drug use.  Current Medications: has a current medication list which includes the following prescription(s): alprazolam, clobetasol, dextroamphetamine-amphetamine, escitalopram oxalate, and metoprolol succinate.  Allergies:  Allergies as of 02/15/2023  (No Known Allergies)   ROS:  A 15 point review of systems was performed and was negative except as noted in HPI  Objective:    BP (!) 139/91  Pulse 107  Ht 167.6 cm ('5\' 6"'$ )  Wt 82.1 kg (181 lb)  BMI 29.21 kg/m   Constitutional : No distress, cooperative, alert  Lymphatics/Throat: Supple with no lymphadenopathy  Respiratory: Clear to auscultation bilaterally  Cardiovascular: Regular rate and rhythm  Gastrointestinal: Soft, non-tender, non-distended, no organomegaly.  Musculoskeletal: Steady gait and movement  Skin: Cool and moist  Psychiatric: Normal affect, non-agitated, not confused  Breast: Normal appearance and no palpable abnormality in bilateral breasts and axilla. Chaperone present for exam.    LABS:  Addendum by  Verdell Face, MD on 02/11/2023 4:44 PM EST  ADDENDUM REPORT: 02/11/2023 14:44   ADDENDUM:  PATHOLOGY revealed: Site A. 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  Size of invasive carcinoma: 1.7 cm in linear length in this sample.  Grade 3. Ductal carcinoma in situ: Present, high-grade.  Lymphovascular invasion: Indeterminate   Pathology results are CONCORDANT with imaging findings, per Dr.  Valentino Saxon.   PATHOLOGY revealed: Site   B. LYMPH NODE, LEFT AXILLA; STEREOTACTIC CORE BIOPSY: - INVOLVED BY  INVASIVE MAMMARY CARCINOMA, NO SPECIAL TYPE. - SEE COMMENT.   Pathology results are CONCORDANT with imaging findings, per Dr.  Valentino Saxon.   Pathology results and recommendations below were discussed with  patient by telephone on 02/11/2023. Patient reported biopsy site  within normal limits with slight tenderness at the site. Post biopsy  care instructions were reviewed, questions were answered and my  direct phone number was provided to patient. Patient was instructed  to call [facility] if any concerns or questions arise  related to the biopsy.   RECOMMENDATIONS: 1. Surgical and oncological consultation. Request  for surgical and oncological consultation relayed to Casper Harrison RN at  [facility] by Electa Sniff RN on 02/11/2023.   2. Consider pretreatment bilateral breast MRI with and without  contrast given patient's relatively young age and to evaluate extent  of breast disease.   Pathology results reported by Electa Sniff RN on 02/11/2023.   Electronically Signed  By: Valentino Saxon M.D.  On: 02/11/2023 14:44   RADS: CLINICAL DATA: LEFT breast palpable mass for 10 days.   EXAM:  DIGITAL DIAGNOSTIC BILATERAL  MAMMOGRAM WITH TOMOSYNTHESIS;  ULTRASOUND LEFT BREAST LIMITED   TECHNIQUE:  Bilateral digital diagnostic mammography and breast tomosynthesis  was  performed.; Targeted ultrasound examination of the left breast  was performed.   COMPARISON: None available.   ACR Breast Density Category c: The breasts are heterogeneously  dense, which may obscure small masses.   FINDINGS:  Spot compression tomosynthesis views were obtained of the site of  palpable concern. Subjacent to the site of palpable concern, there  is an irregular mass with associated pleomorphic calcifications.  Associated calcifications span approximately 3 cm and are associated  predominately along the lateral and superior aspect of the mass.  There is a prominent LEFT axillary lymph node noted on MLO view.   No suspicious mass, distortion, or microcalcifications are  identified to suggest presence of malignancy in the RIGHT breast.   On physical exam, there is a hard mass in the upper breast.   Targeted ultrasound was performed of the LEFT breast. At 2 o'clock 5  cm from nipple, there is an irregular hypoechoic mass with  indistinct margins. It measures 24 x 18 x 16 mm. It demonstrates  multiple echogenic foci consistent with calcifications noted  mammographically.   Targeted ultrasound was performed of the LEFT axilla. There is a  single enlarged LEFT axillary lymph node with cortical thickening of  approximately 7 mm.   IMPRESSION:  1. There is a suspicious 24 mm mass at the site of palpable concern  which is concerning for malignancy. It demonstrates 3 cm of  associated calcifications. Recommend ultrasound-guided biopsy for  definitive characterization with attention on post marker placement  mammogram to assess for extent of calcifications in relation to the  biopsy marking clip.  2. There is an indeterminate LEFT axillary lymph node. Recommend  ultrasound-guided biopsy for definitive characterization.  3. No mammographic evidence of malignancy in the RIGHT breast.   RECOMMENDATION:  LEFT breast ultrasound-guided biopsy x1   LEFT axillary  ultrasound-guided biopsy x1   I have discussed the findings and recommendations with the patient.  The biopsy procedure was discussed with the patient and questions  were answered. Patient expressed their understanding of the biopsy  recommendation. Patient will be scheduled for biopsy today. Ordering  provider will be notified. If applicable, a reminder letter will be  sent to the patient regarding the next appointment.   BI-RADS CATEGORY 5: Highly suggestive of malignancy.   Electronically Signed  By: Valentino Saxon M.D.  On: 02/10/2023 10:14   Assessment:   Malignant neoplasm of upper-outer quadrant of left female breast, unspecified estrogen receptor status (CMS-HCC) [C50.412]  Plan:    1. Malignant neoplasm of upper-outer quadrant of left female breast, unspecified estrogen receptor status (CMS-HCC) [C50.412] She is open to all options at this point. Likely additional imaging first, then neoadjuvant treatment prior to any surgery. already had discussion about port placement so as soon as she decides course, we can start scheduling from surgery standpoint.  Risk alternative benefits discussed for port placement. Risks include bleeding, infection, dislocation, migration, malfunction, unsuccessful placement, pneumothorax, and additional procedures to address that risks. Benefits include initiation of chemotherapy. Alternative includes non-IV infusion therapy.  We discussed the need for the port to infuse IV chemotherapy agents, and how the port can be a temporary and/or permanent option depending on patient preference after completion of chemotherapy. Port will be okay to use as soon as it is placed.  Patient verbalized understanding and all questions and concerns addressed.  labs/images/medications/previous chart entries  reviewed personally and relevant changes/updates noted above.

## 2023-02-22 NOTE — Progress Notes (Signed)
Ms. Fagley called to discuss bone scan results that she saw in mychart.   Something was noted on the rib but needs CT correlation.  She is scheduled for her CT scan next week.

## 2023-02-22 NOTE — Progress Notes (Signed)
Established Patient Office Visit  Subjective:  Patient ID: Cristina Baker, female    DOB: Apr 25, 1986  Age: 37 y.o. MRN: [id number]  Chief Complaint  Patient presents with   Follow-up    3 month follow up and lab results    Here for follow up, recently diagnosed with breast cancer and is scheduled to start chemo in 2 wks. Found a lump on self examination and self referred for a mammo, uss and biopsy which was positive with lymph node involvement. Anxiety had improved up till her cancer diagnosis.     Past Medical History:  Diagnosis Date   Abnormal Pap smear of cervix    BRCA negative 2015   MyRisk neg; IBIS=18/8%   Family history of breast cancer 2015   IBIS=18.8%   Family history of ovarian cancer    Herpes genitalis    PONV (postoperative nausea and vomiting)    nausea   Tachycardia, unspecified     Past Surgical History:  Procedure Laterality Date   BREAST BIOPSY Left 02/10/2023   [place] Core Bx, Coil Clip - path pending   BREAST BIOPSY Left 02/10/2023   [place] Core Hydromark clip path pending   BREAST BIOPSY Left 02/10/2023   [place] LT BREAST BX W LOC DEV 1ST LESION IMG BX SPEC US GUIDE 02/10/2023 [facility]   CESAREAN SECTION     CESAREAN SECTION WITH BILATERAL TUBAL LIGATION N/A 03/26/2016   Procedure: CESAREAN SECTION WITH BILATERAL TUBAL LIGATION;  Surgeon: Will Bonnet, MD;  Location: [facility] ORS;  Service: Obstetrics;  Laterality: N/A;   COLPOSCOPY     IUD REMOVAL      Social History   Socioeconomic History   Marital status: Married    Spouse name: Not on file   Number of children: Not on file   Years of education: Not on file   Highest education level: Not on file  Occupational History   Not on file  Tobacco Use   Smoking status: Former   Smokeless tobacco: Never  Vaping Use   Vaping Use: Never used  Substance and Sexual Activity   Alcohol use: No   Drug use: No   Sexual activity: Yes    Birth control/protection: Surgical    Comment: tubal  ligation  Other Topics Concern   Not on file  Social History Narrative   Not on file   Social Determinants of Health   Financial Resource Strain: Not on file  Food Insecurity: Unknown (02/17/2023)   Hunger Vital Sign    Worried About Running Out of Food in the Last Year: Never true    Ran Out of Food in the Last Year: Not on file  Transportation Needs: No Transportation Needs (02/17/2023)   PRAPARE - Hydrologist (Medical): No    Lack of Transportation (Non-Medical): No  Physical Activity: Unknown (06/02/2018)   Exercise Vital Sign    Days of Exercise per Week: 2 days    Minutes of Exercise per Session: Not on file  Stress: Not on file  Social Connections: Not on file  Intimate Partner Violence: Not At Risk (02/17/2023)   Humiliation, Afraid, Rape, and Kick questionnaire    Fear of Current or Ex-Partner: No    Emotionally Abused: No    Physically Abused: No    Sexually Abused: No    Family History  Problem Relation Age of Onset   Hypercholesterolemia Mother    Diabetes Mellitus II Father    Hypertension Father  Breast cancer Paternal Aunt 63   Breast cancer Paternal Aunt 39   Breast cancer Paternal Grandmother    Throat cancer Paternal Grandfather    Heart attack Paternal Grandfather     No Known Allergies  Review of Systems  Constitutional: Negative.   HENT: Negative.    Eyes: Negative.   Respiratory: Negative.    Cardiovascular: Negative.   Gastrointestinal: Negative.   Genitourinary: Negative.   Skin: Negative.   Neurological: Negative.   Endo/Heme/Allergies: Negative.        Objective:   BP 122/88   Pulse (!) 102   Ht '5\' 6"'$  (1.676 m)   Wt 185 lb 6.4 oz (84.1 kg)   LMP 02/03/2023 (Exact Date) Comment: Pt had a tubal ligation in 2017.  SpO2 97%   BMI 29.92 kg/m   Vitals:   02/22/23 1121  BP: 122/88  Pulse: (!) 102  Height: '5\' 6"'$  (1.676 m)  Weight: 185 lb 6.4 oz (84.1 kg)  SpO2: 97%  BMI (Calculated): 29.94     Physical Exam Vitals reviewed.  Constitutional:      General: She is not in acute distress. HENT:     Head: Normocephalic.     Nose: Nose normal.     Mouth/Throat:     Mouth: Mucous membranes are moist.  Eyes:     Extraocular Movements: Extraocular movements intact.     Pupils: Pupils are equal, round, and reactive to light.  Cardiovascular:     Rate and Rhythm: Normal rate and regular rhythm.     Heart sounds: No murmur heard. Pulmonary:     Effort: Pulmonary effort is normal.     Breath sounds: No rhonchi or rales.  Abdominal:     General: Abdomen is flat.     Palpations: There is no hepatomegaly, splenomegaly or mass.  Musculoskeletal:        General: Normal range of motion.     Cervical back: Normal range of motion. No tenderness.  Skin:    General: Skin is warm and dry.  Neurological:     General: No focal deficit present.     Mental Status: She is alert and oriented to person, place, and time.     Cranial Nerves: No cranial nerve deficit.     Motor: No weakness.  Psychiatric:        Mood and Affect: Mood normal.        Behavior: Behavior normal.      No results found for any visits on 02/22/23.      Assessment & Plan:   Problem List Items Addressed This Visit       Other   Adult ADHD (attention deficit hyperactivity disorder)   Mixed hyperlipidemia   Relevant Orders   Lipid panel   Anxiety - Primary  1. Anxiety  2. Mixed hyperlipidemia - Lipid panel; Future  3. Adult ADHD (attention deficit hyperactivity disorder)    Return in about 3 months (around 05/25/2023).   Total time spent: 20 minutes  Volanda Napoleon, MD  02/22/2023

## 2023-02-23 ENCOUNTER — Other Ambulatory Visit: Payer: Self-pay | Admitting: *Deleted

## 2023-02-23 ENCOUNTER — Encounter
Admission: RE | Admit: 2023-02-23 | Discharge: 2023-02-23 | Disposition: A | Discharge status: Home / Self Care | Payer: Managed Care, Other (non HMO) | Source: Ambulatory Visit | Attending: Surgery | Admitting: Surgery

## 2023-02-23 ENCOUNTER — Inpatient Hospital Stay: Payer: Managed Care, Other (non HMO) | Admitting: Licensed Clinical Social Worker

## 2023-02-23 ENCOUNTER — Encounter: Payer: Self-pay | Admitting: Urgent Care

## 2023-02-23 ENCOUNTER — Inpatient Hospital Stay: Payer: Managed Care, Other (non HMO)

## 2023-02-23 VITALS — Ht 66.0 in | Wt 183.0 lb

## 2023-02-23 DIAGNOSIS — R002 Palpitations: Secondary | ICD-10-CM

## 2023-02-23 DIAGNOSIS — Z01810 Encounter for preprocedural cardiovascular examination: Secondary | ICD-10-CM | POA: Insufficient documentation

## 2023-02-23 DIAGNOSIS — Z803 Family history of malignant neoplasm of breast: Secondary | ICD-10-CM

## 2023-02-23 DIAGNOSIS — C50912 Malignant neoplasm of unspecified site of left female breast: Secondary | ICD-10-CM

## 2023-02-23 DIAGNOSIS — Z01812 Encounter for preprocedural laboratory examination: Secondary | ICD-10-CM

## 2023-02-23 DIAGNOSIS — Z5111 Encounter for antineoplastic chemotherapy: Secondary | ICD-10-CM | POA: Diagnosis not present

## 2023-02-23 HISTORY — DX: Attention-deficit hyperactivity disorder, unspecified type: F90.9

## 2023-02-23 HISTORY — DX: Lichen sclerosus et atrophicus: L90.0

## 2023-02-23 HISTORY — DX: Ventricular premature depolarization: I49.3

## 2023-02-23 HISTORY — DX: Atrial premature depolarization: I49.1

## 2023-02-23 LAB — CBC WITH DIFFERENTIAL/PLATELET
Abs Immature Granulocytes: 0.07 10*3/uL (ref 0.00–0.07)
Basophils Absolute: 0 10*3/uL (ref 0.0–0.1)
Basophils Relative: 0 %
Eosinophils Absolute: 0.1 10*3/uL (ref 0.0–0.5)
Eosinophils Relative: 1 %
HCT: 37.3 % (ref 36.0–46.0)
Hemoglobin: 12.3 g/dL (ref 12.0–15.0)
Immature Granulocytes: 1 %
Lymphocytes Relative: 33 %
Lymphs Abs: 3.5 10*3/uL (ref 0.7–4.0)
MCH: 28.2 pg (ref 26.0–34.0)
MCHC: 33 g/dL (ref 30.0–36.0)
MCV: 85.6 fL (ref 80.0–100.0)
Monocytes Absolute: 0.7 10*3/uL (ref 0.1–1.0)
Monocytes Relative: 7 %
Neutro Abs: 6.2 10*3/uL (ref 1.7–7.7)
Neutrophils Relative %: 58 %
Platelets: 367 10*3/uL (ref 150–400)
RBC: 4.36 MIL/uL (ref 3.87–5.11)
RDW: 13.1 % (ref 11.5–15.5)
WBC: 10.5 10*3/uL (ref 4.0–10.5)
nRBC: 0 % (ref 0.0–0.2)

## 2023-02-23 LAB — CMP (CANCER CENTER ONLY)
ALT: 22 U/L (ref 0–44)
AST: 17 U/L (ref 15–41)
Albumin: 4.1 g/dL (ref 3.5–5.0)
Alkaline Phosphatase: 63 U/L (ref 38–126)
Anion gap: 5 (ref 5–15)
BUN: 17 mg/dL (ref 6–20)
CO2: 24 mmol/L (ref 22–32)
Calcium: 9.1 mg/dL (ref 8.9–10.3)
Chloride: 105 mmol/L (ref 98–111)
Creatinine: 0.51 mg/dL (ref 0.44–1.00)
GFR, Estimated: >60 mL/min (ref >60)
Glucose, Bld: 94 mg/dL (ref 70–99)
Potassium: 3.8 mmol/L (ref 3.5–5.1)
Sodium: 134 mmol/L — ABNORMAL LOW (ref 135–145)
Total Bilirubin: 0.4 mg/dL (ref 0.3–1.2)
Total Protein: 7.4 g/dL (ref 6.5–8.1)

## 2023-02-23 MED ORDER — ORAL CARE MOUTH RINSE: 15.0000 mL | Freq: Once | OROMUCOSAL | Status: AC

## 2023-02-23 MED ORDER — LACTATED RINGERS IV SOLN: INTRAVENOUS | Status: DC

## 2023-02-23 MED ORDER — CHLORHEXIDINE GLUCONATE CLOTH 2 % EX PADS: 6.0000 | MEDICATED_PAD | Freq: Once | CUTANEOUS | Status: DC

## 2023-02-23 MED ORDER — CEFAZOLIN SODIUM-DEXTROSE 2-4 GM/100ML-% IV SOLN
2.0000 g | INTRAVENOUS | Status: AC
  Administered 2023-02-24: 2 g via INTRAVENOUS

## 2023-02-23 MED ORDER — CHLORHEXIDINE GLUCONATE 0.12 % MT SOLN: 15.0000 mL | Freq: Once | OROMUCOSAL | Status: AC

## 2023-02-23 MED ORDER — FAMOTIDINE 20 MG PO TABS: 20.0000 mg | ORAL_TABLET | Freq: Once | ORAL | Status: AC

## 2023-02-23 NOTE — Progress Notes (Signed)
REFERRING PROVIDER: Jane Canary, MD [place],  [place] [address]  PRIMARY PROVIDER:  Jodi Marble, MD  PRIMARY REASON FOR VISIT:  1. Malignant neoplasm of left breast in female, estrogen receptor negative, unspecified site of breast ([place])   2. Family history of breast cancer      HISTORY OF PRESENT ILLNESS:   Cristina Baker, a 37 y.o. female, was seen for a [facility] cancer genetics consultation at the request of Dr. Darrall Dears due to a personal and family history of cancer.  Cristina Baker presents to clinic today to discuss the possibility of a hereditary predisposition to cancer, genetic testing, and to further clarify her future cancer risks, as well as potential cancer risks for family members.   CANCER HISTORY:  In 2024, at the age of 85, Cristina Baker was diagnosed with invasive ductal carcinoma of the left breast, ER/PR-, HER2+. The treatment plan currently includes neoadjuvant chemotherapy. She had negative [facility] MyRisk testing in 2015.   Oncology History  Breast cancer ([place])  01/26/2023 Initial Diagnosis   Patient felt palpable lump in left breast   02/10/2023 Mammogram     IMPRESSION: 1. There is a suspicious 24 mm mass at the site of palpable concern which is concerning for malignancy. It demonstrates 3 cm of associated calcifications. Recommend ultrasound-guided biopsy for definitive characterization with attention on post marker placement mammogram to assess for extent of calcifications in relation to the biopsy marking clip. 2. There is an indeterminate LEFT axillary lymph node. Recommend ultrasound-guided biopsy for definitive characterization. 3. No mammographic evidence of malignancy in the RIGHT breast.   02/10/2023 Pathology Results   DIAGNOSIS: A.  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  Size of invasive carcinoma: 1.7 cm in linear length in this sample Histologic grade of  invasive carcinoma: Grade 3 Glandular/tubular differentiation score: 3 Nuclear pleomorphism score: 3 Mitotic rate score: 2 Total score: 8 Ductal carcinoma in situ: Present, high-grade Lymphovascular invasion: Indeterminate See comment.  B.  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   Estrogen Receptor (ER) Status: NEGATIVE (LESS THAN 1%)         Internal control cells positive Progesterone Receptor (PgR) Status: Negative HER2 (by immunohistochemistry): Positive Ki-67: Not performed    02/17/2023 Cancer Staging   Staging form: Breast, AJCC 8th Edition - Clinical: Stage IIB (cT2, cN1, cM0, G3, ER-, PR-, HER2+) - Signed by Jane Canary, MD on 02/17/2023 Histologic grading system: 3 grade system   03/07/2023 -  Chemotherapy   Patient is on Treatment Plan : BREAST  Docetaxel + Carboplatin + Trastuzumab + Pertuzumab  (TCHP) q21d        RISK FACTORS:  Menarche was at age 45.  First live birth at age 20.  Ovaries intact: yes.  Hysterectomy: no.  Menopausal status: premenopausal.  HRT use: 0 years. Colonoscopy: no; not examined. Mammogram within the last year: yes.  Past Medical History:  Diagnosis Date   Abnormal Pap smear of cervix    Adult ADHD    BRCA negative 2015   MyRisk neg; IBIS=18/8%   Breast cancer ([place]) 01/2023   left   Family history of breast cancer 2015   IBIS=18.8%   Family history of ovarian cancer    Herpes genitalis    Lichen sclerosus    PAC (premature atrial contraction)    PONV (postoperative nausea and vomiting)    nausea  PVC's (premature ventricular contractions)    Tachycardia, unspecified     Past Surgical History:  Procedure Laterality Date   BREAST BIOPSY Left 02/10/2023   [place] Core Bx, Coil Clip - path pending   BREAST BIOPSY Left 02/10/2023   [place] Core Hydromark clip path pending   BREAST BIOPSY Left 02/10/2023   [place] LT BREAST BX W LOC DEV 1ST LESION IMG BX SPEC US  GUIDE 02/10/2023 [facility]   CESAREAN SECTION  01/03/2007   CESAREAN SECTION WITH BILATERAL TUBAL LIGATION N/A 03/26/2016   Procedure: CESAREAN SECTION WITH BILATERAL TUBAL LIGATION;  Surgeon: Will Bonnet, MD;  Location: [facility] ORS;  Service: Obstetrics;  Laterality: N/A;   COLPOSCOPY     IUD REMOVAL      FAMILY HISTORY:  We obtained a detailed, 4-generation family history.  Significant diagnoses are listed below: Family History  Problem Relation Age of Onset   Hypercholesterolemia Mother    Diabetes Mellitus II Father    Hypertension Father    Breast cancer Paternal Aunt 31   Breast cancer Paternal Aunt 12   Breast cancer Paternal Grandmother    Throat cancer Paternal Grandfather    Heart attack Paternal Grandfather    Cristina Baker has 2 brothers and 1 sister, no cancers. She has 2 daughters and 1 son.   Cristina Baker mother is living at 23. No known cancers on this side of the family.  Cristina Baker father is living at 109 and has had skin cancer. Patient has a paternal aunt that was diagnosed with breast cancer at 76 and passed at 68. Another aunt had breast cancer at 61 and is living at 60. Another aunt had bladder cancer at 98. Paternal grandmother had breast and cervical cancer and passed at 60. Grandfather had throat cancer and died at 19.   Cristina Baker is aware of previous family history of genetic testing for hereditary cancer risks. There is no reported Ashkenazi Jewish ancestry. There is no known consanguinity.     GENETIC COUNSELING ASSESSMENT: Cristina Baker is a 37 y.o. female with a personal and family history of breast cancer which is somewhat suggestive of a hereditary cancer syndrome and predisposition to cancer. We, therefore, discussed and recommended the following at today's visit.   DISCUSSION: We discussed that approximately 10% of breast cancer is hereditary. Most cases of hereditary breast cancer are associated with BRCA1/BRCA2 genes, although  there are other genes associated with hereditary cancer as well. Cancers and risks are gene specific. There are more genes we can test and improved testing technology since she was last tested in 2015.  We discussed that testing is beneficial for several reasons including knowing about cancer risks, identifying potential screening and risk-reduction options that may be appropriate, and to understand if other family members could be at risk for cancer and allow them to undergo genetic testing.   We reviewed the characteristics, features and inheritance patterns of hereditary cancer syndromes. We also discussed genetic testing, including the appropriate family members to test, the process of testing, insurance coverage and turn-around-time for results. We discussed the implications of a negative, positive and/or variant of uncertain significant result. We recommended Cristina Baker pursue genetic testing for the [facility] Multi-Cancer+RNA gene panel.   Based on Cristina Baker's family history of cancer, she meets medical criteria for genetic testing. Despite that she meets criteria, she may still have an out of pocket cost. We discussed that if her out of pocket cost for testing is over $100, the  laboratory will call and confirm whether she wants to proceed with testing.  If the out of pocket cost of testing is less than $100 she will be billed by the genetic testing laboratory.   PLAN: After considering the risks, benefits, and limitations, Cristina Baker provided informed consent to pursue genetic testing and the blood sample was sent to [facility] for analysis of the Multi-Cancer+RNA panel. Results should be available within approximately 2-3 weeks' time, at which point they will be disclosed by telephone to Cristina Baker, as will any additional recommendations warranted by these results. Cristina Baker will receive a summary of her genetic counseling visit and a copy of her results once available. This  information will also be available in Epic.   Cristina Baker questions were answered to her satisfaction today. Our contact information was provided should additional questions or concerns arise. Thank you for the referral and allowing [place] to share in the care of your patient.   Faith Rogue, MS, [facility] Genetic Counselor [email or web address] Phone: [phone]  The patient was seen for a total of 15 minutes in face-to-face genetic counseling.  Dr. Grayland Ormond was available for discussion regarding this case.   _______________________________________________________________________ For Office Staff:  Number of people involved in session: 1 Was an Intern/ student involved with case: no

## 2023-02-23 NOTE — Patient Instructions (Addendum)
Your procedure is scheduled on: March 14 Report to the Registration Desk on the 1st floor of the [facility]. To find out your arrival time, please call [phone] between 1PM - 3PM on: March 13 If your arrival time is 6:00 am, do not arrive before that time as the [facility] entrance doors do not open until 6:00 am.  REMEMBER: Instructions that are not followed completely may result in serious medical risk, up to and including death; or upon the discretion of your surgeon and anesthesiologist your surgery may need to be rescheduled.  Do not eat food after midnight the night before surgery.  No gum chewing or hard candies.  You may however, drink CLEAR liquids up to 2 hours before you are scheduled to arrive for your surgery. Do not drink anything within 2 hours of your scheduled arrival time.  Clear liquids include: - water  - apple juice without pulp - gatorade (not RED colors) - black coffee or tea (Do NOT add milk or creamers to the coffee or tea) Do NOT drink anything that is not on this list.  One week prior to surgery: Stop Anti-inflammatories (NSAIDS) such as Advil, Aleve, Ibuprofen, Motrin, Naproxen, Naprosyn and Aspirin based products such as Excedrin, Goody's Powder, BC Powder. Stop ANY OVER THE COUNTER supplements until after surgery. You may however, continue to take Tylenol if needed for pain up until the day of surgery.  Continue taking all prescribed medications   TAKE ONLY THESE MEDICATIONS THE MORNING OF SURGERY WITH A SIP OF WATER:  Alprazolam (Xanax) if needed for anxiety Escitalopram (Lexapro) Metoprolol  No Alcohol for 24 hours before or after surgery.  No Smoking including e-cigarettes for 24 hours before surgery.  No chewable tobacco products for at least 6 hours before surgery.  No nicotine patches on the day of surgery.  Do not use any "recreational" drugs for at least a week (preferably 2 weeks) before your surgery.  Please be advised that the  combination of cocaine and anesthesia may have negative outcomes, up to and including death. If you test positive for cocaine, your surgery will be cancelled.  On the morning of surgery brush your teeth with toothpaste and water, you may rinse your mouth with mouthwash if you wish. Do not swallow any toothpaste or mouthwash.  Use CHG Soap as directed on instruction sheet.  Do not wear jewelry, make-up, hairpins, clips or nail polish.  Do not wear lotions, powders, or perfumes.   Do not shave body hair from the neck down 48 hours before surgery.  Contact lenses, hearing aids and dentures may not be worn into surgery.  Do not bring valuables to the hospital. [facility] is not responsible for any missing/lost belongings or valuables.   Notify your doctor if there is any change in your medical condition (cold, fever, infection).  Wear comfortable clothing (specific to your surgery type) to the hospital.  After surgery, you can help prevent lung complications by doing breathing exercises.  Take deep breaths and cough every 1-2 hours. Your doctor may order a device called an Incentive Spirometer to help you take deep breaths.  If you are being discharged the day of surgery, you will not be allowed to drive home. You will need a responsible individual to drive you home and stay with you for 24 hours after surgery.   If you are taking public transportation, you will need to have a responsible individual with you.  Please call the [facility]  at [phone] if you have any questions about these instructions.  Surgery Visitation Policy:  Patients undergoing a surgery or procedure may have two family members or support persons with them as long as the person is not COVID-19 positive or experiencing its symptoms.      Preparing for Surgery with CHLORHEXIDINE GLUCONATE (CHG) Soap  Chlorhexidine Gluconate (CHG) Soap  o An antiseptic cleaner that kills germs and bonds  with the skin to continue killing germs even after washing  o Used for showering the night before surgery and morning of surgery  Before surgery, you can play an important role by reducing the number of germs on your skin.  CHG (Chlorhexidine gluconate) soap is an antiseptic cleanser which kills germs and bonds with the skin to continue killing germs even after washing.  Please do not use if you have an allergy to CHG or antibacterial soaps. If your skin becomes reddened/irritated stop using the CHG.  1. Shower the NIGHT BEFORE SURGERY and the MORNING OF SURGERY with CHG soap.  2. If you choose to wash your hair, wash your hair first as usual with your normal shampoo.  3. After shampooing, rinse your hair and body thoroughly to remove the shampoo.  4. Use CHG as you would any other liquid soap. You can apply CHG directly to the skin and wash gently with a scrungie or a clean washcloth.  5. Apply the CHG soap to your body only from the neck down. Do not use on open wounds or open sores. Avoid contact with your eyes, ears, mouth, and genitals (private parts). Wash face and genitals (private parts) with your normal soap.  6. Wash thoroughly, paying special attention to the area where your surgery will be performed.  7. Thoroughly rinse your body with warm water.  8. Do not shower/wash with your normal soap after using and rinsing off the CHG soap.  9. Pat yourself dry with a clean towel.  10. Wear clean pajamas to bed the night before surgery.  12. Place clean sheets on your bed the night of your first shower and do not sleep with pets.  13. Shower again with the CHG soap on the day of surgery prior to arriving at the hospital.  14. Do not apply any deodorants/lotions/powders.  15. Please wear clean clothes to the hospital.

## 2023-02-24 ENCOUNTER — Ambulatory Visit: Payer: Managed Care, Other (non HMO)

## 2023-02-24 ENCOUNTER — Encounter
Admission: RE | Disposition: A | Discharge status: Home / Self Care | Payer: Self-pay | Source: Home / Self Care | Attending: Surgery

## 2023-02-24 ENCOUNTER — Encounter: Payer: Self-pay | Admitting: Surgery

## 2023-02-24 ENCOUNTER — Ambulatory Visit: Payer: Managed Care, Other (non HMO) | Admitting: Anesthesiology

## 2023-02-24 ENCOUNTER — Other Ambulatory Visit: Payer: Self-pay

## 2023-02-24 ENCOUNTER — Ambulatory Visit
Admission: RE | Admit: 2023-02-24 | Discharge: 2023-02-24 | Disposition: A | Discharge status: Home / Self Care | Payer: Managed Care, Other (non HMO) | Attending: Surgery | Admitting: Surgery

## 2023-02-24 DIAGNOSIS — Z87891 Personal history of nicotine dependence: Secondary | ICD-10-CM | POA: Insufficient documentation

## 2023-02-24 DIAGNOSIS — F419 Anxiety disorder, unspecified: Secondary | ICD-10-CM | POA: Insufficient documentation

## 2023-02-24 DIAGNOSIS — C50412 Malignant neoplasm of upper-outer quadrant of left female breast: Secondary | ICD-10-CM | POA: Diagnosis present

## 2023-02-24 DIAGNOSIS — Z01812 Encounter for preprocedural laboratory examination: Secondary | ICD-10-CM

## 2023-02-24 DIAGNOSIS — Z452 Encounter for adjustment and management of vascular access device: Secondary | ICD-10-CM | POA: Diagnosis not present

## 2023-02-24 DIAGNOSIS — Z803 Family history of malignant neoplasm of breast: Secondary | ICD-10-CM | POA: Diagnosis not present

## 2023-02-24 HISTORY — PX: PORTACATH PLACEMENT: SHX2246

## 2023-02-24 LAB — POCT PREGNANCY, URINE: Preg Test, Ur: NEGATIVE

## 2023-02-24 SURGERY — INSERTION, TUNNELED CENTRAL VENOUS DEVICE, WITH PORT
Anesthesia: General | Site: Chest

## 2023-02-24 MED ORDER — PROPOFOL 10 MG/ML IV BOLUS
INTRAVENOUS | Status: DC | PRN
  Administered 2023-02-24: 200 mg via INTRAVENOUS
  Administered 2023-02-24: 50 mg via INTRAVENOUS

## 2023-02-24 MED ORDER — FENTANYL CITRATE (PF) 100 MCG/2ML IJ SOLN
INTRAMUSCULAR | Status: AC
  Filled 2023-02-24: qty 2

## 2023-02-24 MED ORDER — ACETAMINOPHEN 10 MG/ML IV SOLN: 1000.0000 mg | Freq: Once | INTRAVENOUS | Status: DC | PRN

## 2023-02-24 MED ORDER — BUPIVACAINE HCL (PF) 0.5 % IJ SOLN
INTRAMUSCULAR | Status: AC
  Filled 2023-02-24: qty 30

## 2023-02-24 MED ORDER — OXYCODONE HCL 5 MG/5ML PO SOLN: 5.0000 mg | Freq: Once | ORAL | Status: DC | PRN

## 2023-02-24 MED ORDER — IBUPROFEN 800 MG PO TABS: 800.0000 mg | ORAL_TABLET | Freq: Three times a day (TID) | ORAL | 0 refills | Status: DC | PRN

## 2023-02-24 MED ORDER — ONDANSETRON HCL 4 MG/2ML IJ SOLN
INTRAMUSCULAR | Status: DC | PRN
  Administered 2023-02-24: 4 mg via INTRAVENOUS

## 2023-02-24 MED ORDER — LIDOCAINE-EPINEPHRINE 1 %-1:100000 IJ SOLN
INTRAMUSCULAR | Status: AC
  Filled 2023-02-24: qty 1

## 2023-02-24 MED ORDER — ACETAMINOPHEN 10 MG/ML IV SOLN
INTRAVENOUS | Status: AC
  Filled 2023-02-24: qty 100

## 2023-02-24 MED ORDER — ONDANSETRON 4 MG PO TBDP: 4.0000 mg | ORAL_TABLET | Freq: Three times a day (TID) | ORAL | 0 refills | Status: DC | PRN

## 2023-02-24 MED ORDER — PROPOFOL 500 MG/50ML IV EMUL
INTRAVENOUS | Status: DC | PRN
  Administered 2023-02-24: 125 ug/kg/min via INTRAVENOUS

## 2023-02-24 MED ORDER — PROMETHAZINE HCL 25 MG/ML IJ SOLN: 6.2500 mg | INTRAMUSCULAR | Status: DC | PRN

## 2023-02-24 MED ORDER — HEPARIN SOD (PORK) LOCK FLUSH 100 UNIT/ML IV SOLN
INTRAVENOUS | Status: AC
  Filled 2023-02-24: qty 5

## 2023-02-24 MED ORDER — HEPARIN SOD (PORK) LOCK FLUSH 100 UNIT/ML IV SOLN
INTRAVENOUS | Status: DC | PRN
  Administered 2023-02-24: 500 [IU]

## 2023-02-24 MED ORDER — DOCUSATE SODIUM 100 MG PO CAPS: 100.0000 mg | ORAL_CAPSULE | Freq: Two times a day (BID) | ORAL | 0 refills | Status: AC | PRN

## 2023-02-24 MED ORDER — OXYCODONE HCL 5 MG PO TABS: 5.0000 mg | ORAL_TABLET | Freq: Once | ORAL | Status: DC | PRN

## 2023-02-24 MED ORDER — LIDOCAINE-EPINEPHRINE (PF) 1 %-1:200000 IJ SOLN
INTRAMUSCULAR | Status: DC | PRN
  Administered 2023-02-24: 10 mL via INTRAMUSCULAR

## 2023-02-24 MED ORDER — FENTANYL CITRATE (PF) 100 MCG/2ML IJ SOLN: 25.0000 ug | INTRAMUSCULAR | Status: DC | PRN

## 2023-02-24 MED ORDER — LIDOCAINE HCL (CARDIAC) PF 100 MG/5ML IV SOSY
PREFILLED_SYRINGE | INTRAVENOUS | Status: DC | PRN
  Administered 2023-02-24: 50 mg via INTRAVENOUS

## 2023-02-24 MED ORDER — PROPOFOL 10 MG/ML IV BOLUS
INTRAVENOUS | Status: AC
  Filled 2023-02-24: qty 20

## 2023-02-24 MED ORDER — CEFAZOLIN SODIUM-DEXTROSE 2-4 GM/100ML-% IV SOLN
INTRAVENOUS | Status: AC
  Filled 2023-02-24: qty 100

## 2023-02-24 MED ORDER — FENTANYL CITRATE (PF) 100 MCG/2ML IJ SOLN
INTRAMUSCULAR | Status: DC | PRN
  Administered 2023-02-24 (×2): 50 ug via INTRAVENOUS

## 2023-02-24 MED ORDER — DROPERIDOL 2.5 MG/ML IJ SOLN: 0.6250 mg | Freq: Once | INTRAMUSCULAR | Status: DC | PRN

## 2023-02-24 MED ORDER — HYDROCODONE-ACETAMINOPHEN 5-325 MG PO TABS: 1.0000 | ORAL_TABLET | Freq: Four times a day (QID) | ORAL | 0 refills | Status: DC | PRN

## 2023-02-24 MED ORDER — FAMOTIDINE 20 MG PO TABS
ORAL_TABLET | ORAL | Status: AC
  Administered 2023-02-24: 20 mg via ORAL
  Filled 2023-02-24: qty 1

## 2023-02-24 MED ORDER — DEXAMETHASONE SODIUM PHOSPHATE 10 MG/ML IJ SOLN
INTRAMUSCULAR | Status: DC | PRN
  Administered 2023-02-24: 10 mg via INTRAVENOUS

## 2023-02-24 MED ORDER — LIDOCAINE HCL (PF) 2 % IJ SOLN
INTRAMUSCULAR | Status: AC
  Filled 2023-02-24: qty 5

## 2023-02-24 MED ORDER — ACETAMINOPHEN 10 MG/ML IV SOLN
INTRAVENOUS | Status: DC | PRN
  Administered 2023-02-24: 1000 mg via INTRAVENOUS

## 2023-02-24 MED ORDER — DEXAMETHASONE SODIUM PHOSPHATE 10 MG/ML IJ SOLN
INTRAMUSCULAR | Status: AC
  Filled 2023-02-24: qty 1

## 2023-02-24 MED ORDER — ONDANSETRON HCL 4 MG/2ML IJ SOLN
INTRAMUSCULAR | Status: AC
  Filled 2023-02-24: qty 2

## 2023-02-24 MED ORDER — ACETAMINOPHEN 325 MG PO TABS: 650.0000 mg | ORAL_TABLET | Freq: Three times a day (TID) | ORAL | 0 refills | Status: DC | PRN

## 2023-02-24 MED ORDER — CHLORHEXIDINE GLUCONATE 0.12 % MT SOLN
OROMUCOSAL | Status: AC
  Administered 2023-02-24: 15 mL via OROMUCOSAL
  Filled 2023-02-24: qty 15

## 2023-02-24 MED ORDER — PROPOFOL 1000 MG/100ML IV EMUL
INTRAVENOUS | Status: AC
  Filled 2023-02-24: qty 100

## 2023-02-24 MED ORDER — MIDAZOLAM HCL 2 MG/2ML IJ SOLN
INTRAMUSCULAR | Status: DC | PRN
  Administered 2023-02-24: 2 mg via INTRAVENOUS

## 2023-02-24 MED ORDER — MIDAZOLAM HCL 2 MG/2ML IJ SOLN
INTRAMUSCULAR | Status: AC
  Filled 2023-02-24: qty 2

## 2023-02-24 SURGICAL SUPPLY — 38 items
APL PRP STRL LF DISP 70% ISPRP (MISCELLANEOUS) ×1
APL SKNCLS STERI-STRIP NONHPOA (GAUZE/BANDAGES/DRESSINGS) ×1
BAG DECANTER FOR FLEXI CONT (MISCELLANEOUS) ×2 IMPLANT
BENZOIN TINCTURE PRP APPL 2/3 (GAUZE/BANDAGES/DRESSINGS) ×2 IMPLANT
BLADE SURG SZ11 CARB STEEL (BLADE) ×2 IMPLANT
BOOT SUTURE VASCULAR YLW (MISCELLANEOUS) ×1
CHLORAPREP W/TINT 26 (MISCELLANEOUS) ×2 IMPLANT
CLAMP SUTURE YELLOW 5 PAIRS (MISCELLANEOUS) ×2 IMPLANT
COVER LIGHT HANDLE STERIS (MISCELLANEOUS) ×4 IMPLANT
DRAPE C-ARM XRAY 36X54 (DRAPES) ×2 IMPLANT
DRSG TEGADERM 4X4.75 (GAUZE/BANDAGES/DRESSINGS) ×2 IMPLANT
ELECT CAUTERY BLADE 6.4 (BLADE) ×2 IMPLANT
ELECT REM PT RETURN 9FT ADLT (ELECTROSURGICAL) ×1
ELECTRODE REM PT RTRN 9FT ADLT (ELECTROSURGICAL) ×2 IMPLANT
GAUZE 4X4 16PLY ~~LOC~~+RFID DBL (SPONGE) ×2 IMPLANT
GLOVE BIOGEL PI IND STRL 7.0 (GLOVE) ×2 IMPLANT
GLOVE SURG SYN 6.5 ES PF (GLOVE) ×1 IMPLANT
GLOVE SURG SYN 6.5 PF PI (GLOVE) ×2 IMPLANT
GOWN STRL REUS W/ TWL LRG LVL3 (GOWN DISPOSABLE) ×4 IMPLANT
GOWN STRL REUS W/TWL LRG LVL3 (GOWN DISPOSABLE) ×2
IV NS 500ML (IV SOLUTION) ×1
IV NS 500ML BAXH (IV SOLUTION) ×2 IMPLANT
KIT PORT POWER 8FR ISP CVUE (Port) ×2 IMPLANT
KIT TURNOVER KIT A (KITS) ×2 IMPLANT
LABEL OR SOLS (LABEL) ×2 IMPLANT
MANIFOLD NEPTUNE II (INSTRUMENTS) ×2 IMPLANT
PACK PORT-A-CATH (MISCELLANEOUS) ×2 IMPLANT
SPIKE FLUID TRANSFER (MISCELLANEOUS) ×2 IMPLANT
SPONGE VERSALON 4X4 4PLY (MISCELLANEOUS) ×2 IMPLANT
STRIP CLOSURE SKIN 1/2X4 (GAUZE/BANDAGES/DRESSINGS) ×2 IMPLANT
SUT MNCRL AB 4-0 PS2 18 (SUTURE) ×2 IMPLANT
SUT PROLENE 2 0 SH DA (SUTURE) ×2 IMPLANT
SUT VIC AB 3-0 SH 27 (SUTURE) ×1
SUT VIC AB 3-0 SH 27X BRD (SUTURE) ×2 IMPLANT
SYR 10ML LL (SYRINGE) ×2 IMPLANT
TAG SUTURE CLAMP YLW 5PR (MISCELLANEOUS) ×1
TRAP FLUID SMOKE EVACUATOR (MISCELLANEOUS) ×2 IMPLANT
WATER STERILE IRR 500ML POUR (IV SOLUTION) ×2 IMPLANT

## 2023-02-24 NOTE — Transfer of Care (Signed)
Immediate Anesthesia Transfer of Care Note  Patient: Cristina Baker  Procedure(s) Performed: INSERTION PORT-A-CATH (Chest)  Patient Location: PACU  Anesthesia Type:General  Level of Consciousness: awake, alert , and oriented  Airway & Oxygen Therapy: Patient Spontanous Breathing  Post-op Assessment: Report given to RN and Post -op Vital signs reviewed and stable  Post vital signs: Reviewed and stable  Last Vitals:  Vitals Value Taken Time  BP 112/77 02/24/23 1532  Temp    Pulse 88 02/24/23 1536  Resp 21 02/24/23 1536  SpO2 100 % 02/24/23 1536  Vitals shown include unvalidated device data.  Last Pain:  Vitals:   02/24/23 1311  TempSrc: Oral  PainSc: 0-No pain         Complications: No notable events documented.

## 2023-02-24 NOTE — Anesthesia Procedure Notes (Signed)
Procedure Name: LMA Insertion Date/Time: 02/24/2023 2:22 PM  Performed by: Levin Erp, CRNAPre-anesthesia Checklist: Patient identified, Emergency Drugs available, Suction available, Patient being monitored and Timeout performed Patient Re-evaluated:Patient Re-evaluated prior to induction Oxygen Delivery Method: Circle system utilized Preoxygenation: Pre-oxygenation with 100% oxygen Induction Type: IV induction LMA: LMA inserted LMA Size: 4.0 Tube type: Oral Number of attempts: 1 Airway Equipment and Method: Bite block (spft gauze roll) Placement Confirmation: positive ETCO2 and breath sounds checked- equal and bilateral Tube secured with: Tape Dental Injury: Teeth and Oropharynx as per pre-operative assessment

## 2023-02-24 NOTE — Op Note (Signed)
--  OP NOTE  DATE OF PROCEDURE: 02/24/2023   SURGEON: Lysle Pearl  ANESTHESIA: LMA  PRE-OPERATIVE DIAGNOSIS: breast cancer requring port for chemotherapy   POST-OPERATIVE DIAGNOSIS: same  PROCEDURE(S):  1.) Percutaneous access of RIJ vein under ultrasound guidance   2.) Insertion of tunneled RIJ  central venous catheter with subcutaneous port  INTRAOPERATIVE FINDINGS: Patent easily compressible RIJ  vein with appropriate respiratory variations and well-secured tunneled central venous catheter with subcutaneous port at completion of the procedure, heplocked after confirming ease of draw and push  ESTIMATED BLOOD LOSS: Minimal (<20 mL)   SPECIMENS: None   IMPLANTS: 41F tunneled Bard PowerPort central venous catheter with subcutaneous port  DRAINS: None   COMPLICATIONS: None apparent   CONDITION AT COMPLETION: Hemodynamically stable, awake   DISPOSITION: PACU   INDICATION(S) FOR PROCEDURE:  Patient is a 37 y.o. female who presented with above diagnosis.  All risks, benefits, and alternatives to above elective procedures were discussed with the patient, who elected to proceed, and informed consent was accordingly obtained at that time.  DETAILS OF PROCEDURE:  Patient was brought to the operative suite and appropriately identified. In Trendelenburg position, Right IJ venous access site was prepped and draped in the usual sterile fashion, and following a timeout, percutaneous venous access was obtained under ultrasound guidance using Seldinger technique, by which local anesthetic was injected over the area, and access needle was inserted under direct ultrasound visualization through which soft guidewire was advanced with no resistance, over which access needle was withdrawn. Guidewire was secured, attention was directed to injection of local anesthetic along the planned tunnel site, 2-3 cm transverse right chest incision was made and confirmed to accommodate the subcutaneous port, and flushed  catheter was tunneled retrograde from the port site over to the vein access site. Insertion sheath was advanced over the guidewire, which was withdrawn along with the insertion sheath dilator with no resistance. The catheter was introduced through the sheath and tip left in the [place] junction under fluoro guidance and catheter cut to appropriate length.  Catheter connected to port and placed within planned fixation site. Ultrasound exam at needle insertion site, visualized the catheter within the IJ.   Fluro confirmed no kink within the entire length of the catheter at this point.  Port fixed to the pocket on two side to avoid twisting. Port was confirmed to withdraw blood and flush easily with included Heuber needle, and port heplocked. Dermis at the subcutaneous pocket was re-approximated using buried interrupted 3-0 Vicryl suture, and 4-0 Monocryl suture was used to re-approximate skin at the insertion and subcutaneous port sites in running subcuticular fashion.  Incisions then dressed with steristrips, 2x2, and tegaderm.   Patient was then awakened from anesthesia and transferred to PACU in stable condition.  [place] images saved in paper chart and fluoro images saved in Epic.  CXR post op confirmed proper placement of port and no evidence of pneumothorax.

## 2023-02-24 NOTE — Interval H&P Note (Signed)
No changes. OK to proceed.

## 2023-02-24 NOTE — Discharge Instructions (Addendum)
Port cath insertion, Care After This sheet gives you information about how to care for yourself after your procedure. Your health care provider may also give you more specific instructions. If you have problems or questions, contact your health care provider. What can I expect after the procedure? After the procedure, it is common to have: Soreness. Bruising. Itching. Follow these instructions at home: site care Follow instructions from your health care provider about how to take care of your site. Make sure you: Wash your hands with soap and water before and after you change your bandage (dressing). If soap and water are not available, use hand sanitizer. Leave stitches (sutures), skin glue, or adhesive strips in place. These skin closures may need to stay in place for 2 weeks or longer. If adhesive strip edges start to loosen and curl up, you may trim the loose edges. Do not remove adhesive strips completely unless your health care provider tells you to do that. If the area bleeds or bruises, apply gentle pressure for 10 minutes. OK TO SHOWER IN 48HRS.  Outer dressing can be removed right before showering.  No need to keep area covered afterwards  Check your site every day for signs of infection. Check for: Redness, swelling, or pain. Fluid or blood. Warmth. Pus or a bad smell.  General instructions Rest and then return to your normal activities as told by your health care provider.  tylenol and advil as needed for discomfort.  Please alternate between the two every four hours as needed for pain.    Use narcotics, if prescribed, only when tylenol and motrin is not enough to control pain.  325-'650mg'$  every 8hrs to max of '3000mg'$ /24hrs (including the '325mg'$  in every norco dose) for the tylenol.    Advil up to '800mg'$  per dose every 8hrs as needed for pain.   Keep all follow-up visits as told by your health care provider. This is important. Contact a health care provider if: You have redness,  swelling, or pain around your site. You have fluid or blood coming from your site. Your site feels warm to the touch. You have pus or a bad smell coming from your site. You have a fever. Your sutures, skin glue, or adhesive strips loosen or come off sooner than expected. Get help right away if: You have bleeding that does not stop with pressure or a dressing. Summary After the procedure, it is common to have some soreness, bruising, and itching at the site. Follow instructions from your health care provider about how to take care of your site. Check your site every day for signs of infection. Contact a health care provider if you have redness, swelling, or pain around your site, or your site feels warm to the touch. Keep all follow-up visits as told by your health care provider. This is important. This information is not intended to replace advice given to you by your health care provider. Make sure you discuss any questions you have with your health care provider. Document Released: 12/26/2015 Document Revised: 05/29/2018 Document Reviewed: 05/29/2018 Elsevier Interactive Patient Education  2019 [place]   The drugs that you were given will stay in your system until tomorrow so for the next 24 hours you should not:  Drive an automobile Make any legal decisions Drink any alcoholic beverage   You may resume regular meals tomorrow.  Today it is better to start with liquids and gradually work up to solid foods.  You may eat anything you prefer, but it is better to start with liquids, then soup and crackers, and gradually work up to solid foods.   Please notify your doctor immediately if you have any unusual bleeding, trouble breathing, redness and pain at the surgery site, drainage, fever, or pain not relieved by medication.     Additional Instructions:

## 2023-02-24 NOTE — Anesthesia Preprocedure Evaluation (Signed)
Anesthesia Evaluation  Patient identified by MRN, date of birth, ID band Patient awake    Reviewed: Allergy & Precautions, H&P , NPO status , Patient's Chart, lab work & pertinent test results, reviewed documented beta blocker date and time   History of Anesthesia Complications (+) PONV and history of anesthetic complications  Airway Mallampati: II  TM Distance: >3 FB Neck ROM: full    Dental  (+) Teeth Intact   Pulmonary neg pulmonary ROS, former smoker   Pulmonary exam normal        Cardiovascular Exercise Tolerance: Good Normal cardiovascular exam+ dysrhythmias  Rate:Normal     Neuro/Psych   Anxiety     negative neurological ROS  negative psych ROS   GI/Hepatic negative GI ROS, Neg liver ROS,,,  Endo/Other  negative endocrine ROS    Renal/GU negative Renal ROS  negative genitourinary   Musculoskeletal   Abdominal   Peds  Hematology negative hematology ROS (+)   Anesthesia Other Findings   Reproductive/Obstetrics negative OB ROS                             Anesthesia Physical Anesthesia Plan  ASA: 2  Anesthesia Plan: General LMA   Post-op Pain Management:    Induction:   PONV Risk Score and Plan:   Airway Management Planned:   Additional Equipment:   Intra-op Plan:   Post-operative Plan:   Informed Consent: I have reviewed the patients History and Physical, chart, labs and discussed the procedure including the risks, benefits and alternatives for the proposed anesthesia with the patient or authorized representative who has indicated his/her understanding and acceptance.       Plan Discussed with: CRNA  Anesthesia Plan Comments:        Anesthesia Quick Evaluation

## 2023-02-25 ENCOUNTER — Encounter: Payer: Self-pay | Admitting: Surgery

## 2023-02-25 ENCOUNTER — Other Ambulatory Visit: Payer: Self-pay | Admitting: Internal Medicine

## 2023-02-25 NOTE — Anesthesia Postprocedure Evaluation (Signed)
Anesthesia Post Note  Patient: Cristina Baker  Procedure(s) Performed: INSERTION PORT-A-CATH (Chest)  Patient location during evaluation: PACU Anesthesia Type: General Level of consciousness: awake and alert Pain management: pain level controlled Vital Signs Assessment: post-procedure vital signs reviewed and stable Respiratory status: spontaneous breathing, nonlabored ventilation, respiratory function stable and patient connected to nasal cannula oxygen Cardiovascular status: blood pressure returned to baseline and stable Postop Assessment: no apparent nausea or vomiting Anesthetic complications: no   No notable events documented.   Last Vitals:  Vitals:   02/24/23 1622 02/24/23 1625  BP: 110/67   Pulse: 80 94  Resp: 13 15  Temp: 37 C   SpO2: 95% 97%    Last Pain:  Vitals:   02/24/23 1622  TempSrc:   PainSc: 0-No pain                 Molli Barrows

## 2023-02-28 ENCOUNTER — Other Ambulatory Visit: Payer: Self-pay | Admitting: Cardiovascular Disease

## 2023-03-01 ENCOUNTER — Encounter: Payer: Self-pay | Admitting: *Deleted

## 2023-03-01 ENCOUNTER — Ambulatory Visit
Admission: RE | Admit: 2023-03-01 | Discharge: 2023-03-01 | Disposition: A | Discharge status: Home / Self Care | Payer: Managed Care, Other (non HMO) | Source: Ambulatory Visit | Attending: Internal Medicine | Admitting: Internal Medicine

## 2023-03-01 DIAGNOSIS — Z171 Estrogen receptor negative status [ER-]: Secondary | ICD-10-CM | POA: Diagnosis present

## 2023-03-01 DIAGNOSIS — C50912 Malignant neoplasm of unspecified site of left female breast: Secondary | ICD-10-CM | POA: Diagnosis not present

## 2023-03-01 MED ORDER — IOHEXOL 300 MG/ML  SOLN
100.0000 mL | Freq: Once | INTRAMUSCULAR | Status: AC | PRN
  Administered 2023-03-01: 100 mL via INTRAVENOUS

## 2023-03-01 NOTE — Progress Notes (Signed)
Spoke to Ms. Germano about CT scan results, per Dr. Darrall Dears her plan is not changing and she will repeat the scan in 6-8 weeks.

## 2023-03-02 ENCOUNTER — Telehealth: Payer: Self-pay | Admitting: Licensed Clinical Social Worker

## 2023-03-02 ENCOUNTER — Ambulatory Visit: Payer: Self-pay | Admitting: Licensed Clinical Social Worker

## 2023-03-02 ENCOUNTER — Encounter: Payer: Self-pay | Admitting: Licensed Clinical Social Worker

## 2023-03-02 ENCOUNTER — Encounter: Payer: Self-pay | Admitting: Internal Medicine

## 2023-03-02 DIAGNOSIS — Z1379 Encounter for other screening for genetic and chromosomal anomalies: Secondary | ICD-10-CM

## 2023-03-02 NOTE — Telephone Encounter (Signed)
I contacted Ms. Hagmann to discuss her genetic testing results. No pathogenic variants were identified in the 70 genes analyzed. Detailed clinic note to follow.   The test report has been scanned into EPIC and is located under the Molecular Pathology section of the Results Review tab.  A portion of the result report is included below for reference.      Faith Rogue, MS, [facility] Genetic Counselor [email or web address] Phone: [phone]

## 2023-03-02 NOTE — Progress Notes (Signed)
HPI:   Cristina Baker was previously seen in the [place] clinic due to a personal and family history of cancer and concerns regarding a hereditary predisposition to cancer. Please refer to our prior cancer genetics clinic note for more information regarding our discussion, assessment and recommendations, at the time. Cristina Baker recent genetic test results were disclosed to her, as were recommendations warranted by these results. These results and recommendations are discussed in more detail below.  CANCER HISTORY:  Oncology History  Breast cancer ([place])  01/26/2023 Initial Diagnosis   Patient felt palpable lump in left breast   02/10/2023 Mammogram     IMPRESSION: 1. There is a suspicious 24 mm mass at the site of palpable concern which is concerning for malignancy. It demonstrates 3 cm of associated calcifications. Recommend ultrasound-guided biopsy for definitive characterization with attention on post marker placement mammogram to assess for extent of calcifications in relation to the biopsy marking clip. 2. There is an indeterminate LEFT axillary lymph node. Recommend ultrasound-guided biopsy for definitive characterization. 3. No mammographic evidence of malignancy in the RIGHT breast.   02/10/2023 Pathology Results   DIAGNOSIS: A.  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  Size of invasive carcinoma: 1.7 cm in linear length in this sample Histologic grade of invasive carcinoma: Grade 3 Glandular/tubular differentiation score: 3 Nuclear pleomorphism score: 3 Mitotic rate score: 2 Total score: 8 Ductal carcinoma in situ: Present, high-grade Lymphovascular invasion: Indeterminate See comment.  B.  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   Estrogen Receptor (ER) Status: NEGATIVE (LESS THAN 1%)         Internal control cells  positive Progesterone Receptor (PgR) Status: Negative HER2 (by immunohistochemistry): Positive Ki-67: Not performed    02/17/2023 Cancer Staging   Staging form: Breast, AJCC 8th Edition - Clinical: Stage IIB (cT2, cN1, cM0, G3, ER-, PR-, HER2+) - Signed by Cristina Canary, MD on 02/17/2023 Histologic grading system: 3 grade system   03/07/2023 -  Chemotherapy   Patient is on Treatment Plan : BREAST  Docetaxel + Carboplatin + Trastuzumab + Pertuzumab  (TCHP) q21d       Genetic Testing   Negative genetic testing. No pathogenic variants identified on the [facility] Multi-Cancer+RNA panel. The report date is 03/01/2023.  The Multi-Cancer + RNA Panel offered by [facility] includes sequencing and/or deletion/duplication analysis of the following 70 genes:  AIP*, ALK, APC*, ATM*, AXIN2*, BAP1*, BARD1*, BLM*, BMPR1A*, BRCA1*, BRCA2*, BRIP1*, CDC73*, CDH1*, CDK4, CDKN1B*, CDKN2A, CHEK2*, CTNNA1*, DICER1*, EPCAM, EGFR, FH*, FLCN*, GREM1, HOXB13, KIT, LZTR1, MAX*, MBD4, MEN1*, MET, MITF, MLH1*, MSH2*, MSH3*, MSH6*, MUTYH*, NF1*, NF2*, NTHL1*, PALB2*, PDGFRA, PMS2*, POLD1*, POLE*, POT1*, PRKAR1A*, PTCH1*, PTEN*, RAD51C*, RAD51D*, RB1*, RET, SDHA*, SDHAF2*, SDHB*, SDHC*, SDHD*, SMAD4*, SMARCA4*, SMARCB1*, SMARCE1*, STK11*, SUFU*, TMEM127*, TP53*, TSC1*, TSC2*, VHL*. RNA analysis is performed for * genes.     FAMILY HISTORY:  We obtained a detailed, 4-generation family history.  Significant diagnoses are listed below: Family History  Problem Relation Age of Onset   Hypercholesterolemia Mother    Diabetes Mellitus II Father    Hypertension Father    Breast cancer Paternal Aunt 75   Breast cancer Paternal Aunt 53   Breast cancer Paternal Grandmother    Throat cancer Paternal Grandfather    Heart attack Paternal Grandfather     Cristina Baker has 2 brothers and 1 sister, no cancers. She has 2 daughters and  1 son.    Cristina Baker mother is living at 10. No known cancers on this side of the family.   [name] father is living at 50 and has had skin cancer. Patient has a paternal aunt that was diagnosed with breast cancer at 31 and passed at 67. Another aunt had breast cancer at 57 and is living at 51. Another aunt had bladder cancer at 31. Paternal grandmother had breast and cervical cancer and passed at 65. Grandfather had throat cancer and died at 59.   Cristina Baker is aware of previous family history of genetic testing for hereditary cancer risks. There is no reported Ashkenazi Jewish ancestry. There is no known consanguinity.     GENETIC TEST RESULTS:  The [facility] Multi-Cancer+RNA Panel found no pathogenic mutations.   The Multi-Cancer + RNA Panel offered by [facility] includes sequencing and/or deletion/duplication analysis of the following 70 genes:  AIP*, ALK, APC*, ATM*, AXIN2*, BAP1*, BARD1*, BLM*, BMPR1A*, BRCA1*, BRCA2*, BRIP1*, CDC73*, CDH1*, CDK4, CDKN1B*, CDKN2A, CHEK2*, CTNNA1*, DICER1*, EPCAM, EGFR, FH*, FLCN*, GREM1, HOXB13, KIT, LZTR1, MAX*, MBD4, MEN1*, MET, MITF, MLH1*, MSH2*, MSH3*, MSH6*, MUTYH*, NF1*, NF2*, NTHL1*, PALB2*, PDGFRA, PMS2*, POLD1*, POLE*, POT1*, PRKAR1A*, PTCH1*, PTEN*, RAD51C*, RAD51D*, RB1*, RET, SDHA*, SDHAF2*, SDHB*, SDHC*, SDHD*, SMAD4*, SMARCA4*, SMARCB1*, SMARCE1*, STK11*, SUFU*, TMEM127*, TP53*, TSC1*, TSC2*, VHL*. RNA analysis is performed for * genes.  The test report has been scanned into EPIC and is located under the Molecular Pathology section of the Results Review tab.  A portion of the result report is included below for reference. Genetic testing reported out on 03/01/2023.     Even though a pathogenic variant was not identified, possible explanations for the cancer in the family may include: There may be no hereditary risk for cancer in the family. The cancers in Cristina Baker and/or her family may be sporadic/familial or due to other genetic and environmental factors. There may be a gene mutation in one of these genes that current testing  methods cannot detect but that chance is small. There could be another gene that has not yet been discovered, or that we have not yet tested, that is responsible for the cancer diagnoses in the family.  It is also possible there is a hereditary cause for the cancer in the family that Cristina Baker did not inherit.  Therefore, it is important to remain in touch with cancer genetics in the future so that we can continue to offer Cristina Baker the most up to date genetic testing.   ADDITIONAL GENETIC TESTING:  We discussed with Cristina Baker that her genetic testing was fairly extensive.  If there are additional relevant genes identified to increase cancer risk that can be analyzed in the future, we would be happy to discuss and coordinate this testing at that time.    CANCER SCREENING RECOMMENDATIONS:  Cristina Baker test result is considered negative (normal).  This means that we have not identified a hereditary cause for her personal and family history of cancer at this time.   An individual's cancer risk and medical management are not determined by genetic test results alone. Overall cancer risk assessment incorporates additional factors, including personal medical history, family history, and any available genetic information that may result in a personalized plan for cancer prevention and surveillance. Therefore, it is recommended she continue to follow the cancer management and screening guidelines provided by her oncology and primary healthcare provider.  RECOMMENDATIONS FOR FAMILY MEMBERS:   Since she did not inherit a identifiable  mutation in a cancer predisposition gene included on this panel, her children could not have inherited a known mutation from her in one of these genes. Individuals in this family might be at some increased risk of developing cancer, over the general population risk, due to the family history of cancer.  Individuals in the family should notify their providers of the  family history of cancer. We recommend women in this family have a yearly mammogram beginning at age 72, or 39 years younger than the earliest onset of cancer, an annual clinical breast exam, and perform monthly breast self-exams.  Family members should have colonoscopies by at age 7, or earlier, as recommended by their providers. Other members of the family may still carry a pathogenic variant in one of these genes that Cristina Baker did not inherit. Based on the family history, we recommend her aunt who had breast cancer in her 38s have genetic counseling and testing. Cristina Baker will let us know if we can be of any assistance in coordinating genetic counseling and/or testing for this family member.    FOLLOW-UP:  Lastly, we discussed with Cristina Baker that cancer genetics is a rapidly advancing field and it is possible that new genetic tests will be appropriate for her and/or her family members in the future. We encouraged her to remain in contact with cancer genetics on an annual basis so we can update her personal and family histories and let her know of advances in cancer genetics that may benefit this family.   Our contact number was provided. Cristina Baker questions were answered to her satisfaction, and she knows she is welcome to call us at anytime with additional questions or concerns.    Cristina Rogue, MS, [facility] Genetic Counselor [email or web address] Phone: [phone]

## 2023-03-03 ENCOUNTER — Inpatient Hospital Stay: Payer: Managed Care, Other (non HMO)

## 2023-03-03 DIAGNOSIS — C50912 Malignant neoplasm of unspecified site of left female breast: Secondary | ICD-10-CM

## 2023-03-03 MED ORDER — LIDOCAINE-PRILOCAINE 2.5-2.5 % EX CREA: TOPICAL_CREAM | CUTANEOUS | 3 refills | Status: DC

## 2023-03-03 MED ORDER — PROCHLORPERAZINE MALEATE 10 MG PO TABS: 10.0000 mg | ORAL_TABLET | Freq: Four times a day (QID) | ORAL | 1 refills | Status: DC | PRN

## 2023-03-03 MED ORDER — DEXAMETHASONE 4 MG PO TABS: ORAL_TABLET | ORAL | 1 refills | Status: DC

## 2023-03-03 MED ORDER — ONDANSETRON HCL 8 MG PO TABS: 8.0000 mg | ORAL_TABLET | Freq: Three times a day (TID) | ORAL | 1 refills | Status: DC | PRN

## 2023-03-04 MED FILL — Dexamethasone Sodium Phosphate Inj 100 MG/10ML: INTRAMUSCULAR | Qty: 1 | Status: AC

## 2023-03-04 MED FILL — Fosaprepitant Dimeglumine For IV Infusion 150 MG (Base Eq): INTRAVENOUS | Qty: 5 | Status: AC

## 2023-03-07 ENCOUNTER — Inpatient Hospital Stay: Payer: Managed Care, Other (non HMO)

## 2023-03-07 ENCOUNTER — Ambulatory Visit: Payer: Managed Care, Other (non HMO)

## 2023-03-07 ENCOUNTER — Encounter: Payer: Self-pay | Admitting: *Deleted

## 2023-03-07 ENCOUNTER — Other Ambulatory Visit: Payer: Managed Care, Other (non HMO)

## 2023-03-07 ENCOUNTER — Inpatient Hospital Stay: Payer: Managed Care, Other (non HMO) | Admitting: Internal Medicine

## 2023-03-07 ENCOUNTER — Ambulatory Visit: Payer: Managed Care, Other (non HMO) | Admitting: Internal Medicine

## 2023-03-07 VITALS — BP 121/81 | HR 90 | Temp 97.8°F | Resp 15 | Wt 189.0 lb

## 2023-03-07 VITALS — BP 128/79 | HR 97 | Temp 98.0°F | Resp 16

## 2023-03-07 DIAGNOSIS — C50912 Malignant neoplasm of unspecified site of left female breast: Secondary | ICD-10-CM

## 2023-03-07 DIAGNOSIS — Z171 Estrogen receptor negative status [ER-]: Secondary | ICD-10-CM

## 2023-03-07 DIAGNOSIS — Z5111 Encounter for antineoplastic chemotherapy: Secondary | ICD-10-CM | POA: Diagnosis not present

## 2023-03-07 LAB — CMP (CANCER CENTER ONLY)
ALT: 29 U/L (ref 0–44)
AST: 25 U/L (ref 15–41)
Albumin: 3.9 g/dL (ref 3.5–5.0)
Alkaline Phosphatase: 63 U/L (ref 38–126)
Anion gap: 6 (ref 5–15)
BUN: 19 mg/dL (ref 6–20)
CO2: 23 mmol/L (ref 22–32)
Calcium: 9.4 mg/dL (ref 8.9–10.3)
Chloride: 106 mmol/L (ref 98–111)
Creatinine: 0.57 mg/dL (ref 0.44–1.00)
GFR, Estimated: >60 mL/min (ref >60)
Glucose, Bld: 120 mg/dL — ABNORMAL HIGH (ref 70–99)
Potassium: 3.6 mmol/L (ref 3.5–5.1)
Sodium: 135 mmol/L (ref 135–145)
Total Bilirubin: 0.5 mg/dL (ref 0.3–1.2)
Total Protein: 7.6 g/dL (ref 6.5–8.1)

## 2023-03-07 LAB — CBC WITH DIFFERENTIAL (CANCER CENTER ONLY)
Abs Immature Granulocytes: 0.13 10*3/uL — ABNORMAL HIGH (ref 0.00–0.07)
Basophils Absolute: 0 10*3/uL (ref 0.0–0.1)
Basophils Relative: 0 %
Eosinophils Absolute: 0 10*3/uL (ref 0.0–0.5)
Eosinophils Relative: 0 %
HCT: 36.6 % (ref 36.0–46.0)
Hemoglobin: 12.2 g/dL (ref 12.0–15.0)
Immature Granulocytes: 1 %
Lymphocytes Relative: 11 %
Lymphs Abs: 1.7 10*3/uL (ref 0.7–4.0)
MCH: 28.4 pg (ref 26.0–34.0)
MCHC: 33.3 g/dL (ref 30.0–36.0)
MCV: 85.3 fL (ref 80.0–100.0)
Monocytes Absolute: 0.2 10*3/uL (ref 0.1–1.0)
Monocytes Relative: 2 %
Neutro Abs: 12.9 10*3/uL — ABNORMAL HIGH (ref 1.7–7.7)
Neutrophils Relative %: 86 %
Platelet Count: 439 10*3/uL — ABNORMAL HIGH (ref 150–400)
RBC: 4.29 MIL/uL (ref 3.87–5.11)
RDW: 13.1 % (ref 11.5–15.5)
WBC Count: 14.9 10*3/uL — ABNORMAL HIGH (ref 4.0–10.5)
nRBC: 0 % (ref 0.0–0.2)

## 2023-03-07 MED ORDER — HEPARIN SOD (PORK) LOCK FLUSH 100 UNIT/ML IV SOLN
INTRAVENOUS | Status: AC
  Administered 2023-03-07: 500 [IU]
  Filled 2023-03-07: qty 5

## 2023-03-07 MED ORDER — SODIUM CHLORIDE 0.9 % IV SOLN
75.0000 mg/m2 | Freq: Once | INTRAVENOUS | Status: AC
  Administered 2023-03-07: 160 mg via INTRAVENOUS
  Filled 2023-03-07: qty 16

## 2023-03-07 MED ORDER — TRASTUZUMAB-ANNS CHEMO 150 MG IV SOLR
8.0000 mg/kg | Freq: Once | INTRAVENOUS | Status: AC
  Administered 2023-03-07: 672 mg via INTRAVENOUS
  Filled 2023-03-07: qty 32

## 2023-03-07 MED ORDER — SODIUM CHLORIDE 0.9 % IV SOLN
150.0000 mg | Freq: Once | INTRAVENOUS | Status: AC
  Administered 2023-03-07: 150 mg via INTRAVENOUS
  Filled 2023-03-07: qty 150

## 2023-03-07 MED ORDER — HEPARIN SOD (PORK) LOCK FLUSH 100 UNIT/ML IV SOLN
500.0000 [IU] | Freq: Once | INTRAVENOUS | Status: AC | PRN
  Filled 2023-03-07: qty 5

## 2023-03-07 MED ORDER — SODIUM CHLORIDE 0.9 % IV SOLN
Freq: Once | INTRAVENOUS | Status: AC
  Filled 2023-03-07: qty 250

## 2023-03-07 MED ORDER — ACETAMINOPHEN 325 MG PO TABS
650.0000 mg | ORAL_TABLET | Freq: Once | ORAL | Status: AC
  Administered 2023-03-07: 650 mg via ORAL
  Filled 2023-03-07: qty 2

## 2023-03-07 MED ORDER — LEUPROLIDE ACETATE (3 MONTH) 11.25 MG IM KIT
11.2500 mg | PACK | Freq: Once | INTRAMUSCULAR | Status: AC
  Administered 2023-03-07: 11.25 mg via INTRAMUSCULAR
  Filled 2023-03-07: qty 11.25

## 2023-03-07 MED ORDER — PALONOSETRON HCL INJECTION 0.25 MG/5ML
0.2500 mg | Freq: Once | INTRAVENOUS | Status: AC
  Administered 2023-03-07: 0.25 mg via INTRAVENOUS
  Filled 2023-03-07: qty 5

## 2023-03-07 MED ORDER — DIPHENHYDRAMINE HCL 25 MG PO CAPS
50.0000 mg | ORAL_CAPSULE | Freq: Once | ORAL | Status: AC
  Administered 2023-03-07: 50 mg via ORAL
  Filled 2023-03-07: qty 2

## 2023-03-07 MED ORDER — SODIUM CHLORIDE 0.9 % IV SOLN
840.0000 mg | Freq: Once | INTRAVENOUS | Status: AC
  Administered 2023-03-07: 840 mg via INTRAVENOUS
  Filled 2023-03-07: qty 28

## 2023-03-07 MED ORDER — SODIUM CHLORIDE 0.9 % IV SOLN
900.0000 mg | Freq: Once | INTRAVENOUS | Status: AC
  Administered 2023-03-07: 900 mg via INTRAVENOUS
  Filled 2023-03-07: qty 90

## 2023-03-07 MED ORDER — SODIUM CHLORIDE 0.9 % IV SOLN
10.0000 mg | Freq: Once | INTRAVENOUS | Status: AC
  Administered 2023-03-07: 10 mg via INTRAVENOUS
  Filled 2023-03-07: qty 10

## 2023-03-07 NOTE — Patient Instructions (Signed)
Cristina Baker  Discharge Instructions: Thank you for choosing [facility] to provide your oncology and hematology care.  If you have a lab appointment with the [facility], please go directly to the [facility] and check in at the registration area.  Wear comfortable clothing and clothing appropriate for easy access to any Portacath or PICC line.   We strive to give you quality time with your provider. You may need to reschedule your appointment if you arrive late (15 or more minutes).  Arriving late affects you and other patients whose appointments are after yours.  Also, if you miss three or more appointments without notifying the office, you may be dismissed from the clinic at the provider's discretion.      For prescription refill requests, have your pharmacy contact our office and allow 72 hours for refills to be completed.    Today you received the following chemotherapy and/or immunotherapy agents Tecentriq, Perjeta, Docetaxel, Carboplatin      To help prevent nausea and vomiting after your treatment, we encourage you to take your nausea medication as directed.  BELOW ARE SYMPTOMS THAT SHOULD BE REPORTED IMMEDIATELY: *FEVER GREATER THAN 100.4 F (38 C) OR HIGHER *CHILLS OR SWEATING *NAUSEA AND VOMITING THAT IS NOT CONTROLLED WITH YOUR NAUSEA MEDICATION *UNUSUAL SHORTNESS OF BREATH *UNUSUAL BRUISING OR BLEEDING *URINARY PROBLEMS (pain or burning when urinating, or frequent urination) *BOWEL PROBLEMS (unusual diarrhea, constipation, pain near the anus) TENDERNESS IN MOUTH AND THROAT WITH OR WITHOUT PRESENCE OF ULCERS (sore throat, sores in mouth, or a toothache) UNUSUAL RASH, SWELLING OR PAIN  UNUSUAL VAGINAL DISCHARGE OR ITCHING   Items with * indicate a potential emergency and should be followed up as soon as possible or go to the Emergency Department if any problems should occur.  Please show the CHEMOTHERAPY ALERT CARD or  IMMUNOTHERAPY ALERT CARD at check-in to the Emergency Department and triage nurse.  Should you have questions after your visit or need to cancel or reschedule your appointment, please contact [facility]  [phone] and follow the prompts.  Office hours are 8:00 a.m. to 4:30 p.m. Monday - Friday. Please note that voicemails left after 4:00 p.m. may not be returned until the following business day.  We are closed weekends and major holidays. You have access to a nurse at all times for urgent questions. Please call the main number to the clinic [phone] and follow the prompts.  For any non-urgent questions, you may also contact your provider using MyChart. We now offer e-Visits for anyone 42 and older to request care online for non-urgent symptoms. For details visit [email or web address].   Also download the MyChart app! Go to the app store, search "MyChart", open the app, select [facility], and log in with your MyChart username and password.  Pertuzumab Injection What is this medication? PERTUZUMAB (per TOOZ ue mab) treats breast cancer. It works by blocking a protein that causes cancer cells to grow and multiply. This helps to slow or stop the spread of cancer cells. It is a monoclonal antibody. This medicine may be used for other purposes; ask your health care provider or pharmacist if you have questions. COMMON BRAND NAME(S): PERJETA What should I tell my care team before I take this medication? They need to know if you have any of these conditions: Heart failure An unusual or allergic reaction to pertuzumab, other medications, foods, dyes, or preservatives Pregnant or trying to get pregnant  Breast-feeding How should I use this medication? This medication is injected into a vein. It is given by your care team in a hospital or clinic setting. Talk to your care team about the use of this medication in children. Special care may be needed. Overdosage:  If you think you have taken too much of this medicine contact a poison control center or emergency room at once. NOTE: This medicine is only for you. Do not share this medicine with others. What if I miss a dose? Keep appointments for follow-up doses. It is important not to miss your dose. Call your care team if you are unable to keep an appointment. What may interact with this medication? Interactions are not expected. This list may not describe all possible interactions. Give your health care provider a list of all the medicines, herbs, non-prescription drugs, or dietary supplements you use. Also tell them if you smoke, drink alcohol, or use illegal drugs. Some items may interact with your medicine. What should I watch for while using this medication? Your condition will be monitored carefully while you are receiving this medication. This medication may make you feel generally unwell. This is not uncommon as chemotherapy can affect healthy cells as well as cancer cells. Report any side effects. Continue your course of treatment even though you feel ill unless your care team tells you to stop. Talk to your care team if you may be pregnant. Serious birth defects can occur if you take this medication during pregnancy and for 7 months after the last dose. You will need a negative pregnancy test before starting this medication. Contraception is recommended while taking this medication and for 7 months after the last dose. Your care team can help you find the option that works for you. Do not breastfeed while taking this medication and for 7 months after the last dose. What side effects may I notice from receiving this medication? Side effects that you should report to your care team as soon as possible: Allergic reactions or angioedema--skin rash, itching or hives, swelling of the face, eyes, lips, tongue, arms, or legs, trouble swallowing or breathing Heart failure--shortness of breath, swelling of the  ankles, feet, or hands, sudden weight gain, unusual weakness or fatigue Infusion reactions--chest pain, shortness of breath or trouble breathing, feeling faint or lightheaded Side effects that usually do not require medical attention (report to your care team if they continue or are bothersome): Diarrhea Dry skin Fatigue Hair loss Nausea Vomiting This list may not describe all possible side effects. Call your doctor for medical advice about side effects. You may report side effects to FDA at 1-800-FDA-1088. Where should I keep my medication? This medication is given in a hospital or clinic. It will not be stored at home. NOTE: This sheet is a summary. It may not cover all possible information. If you have questions about this medicine, talk to your doctor, pharmacist, or health care provider.  2023 Elsevier/Gold Standard (2011-05-25 00:00:00)  Atezolizumab Injection What is this medication? ATEZOLIZUMAB (a te zoe LIZ ue mab) treats some types of cancer. It works by helping your immune system slow or stop the spread of cancer cells. It is a monoclonal antibody. This medicine may be used for other purposes; ask your health care provider or pharmacist if you have questions. COMMON BRAND NAME(S): Tecentriq What should I tell my care team before I take this medication? They need to know if you have any of these conditions: Allogeneic stem cell transplant (uses someone  else's stem cells) Autoimmune diseases, such as Crohn disease, ulcerative colitis, lupus History of chest radiation Nervous system problems, such as Guillain-Barre syndrome, myasthenia gravis Organ transplant An unusual or allergic reaction to atezolizumab, other medications, foods, dyes, or preservatives Pregnant or trying to get pregnant Breast-feeding How should I use this medication? This medication is injected into a vein. It is given by your care team in a hospital or clinic setting. A special MedGuide will be given to  you before each treatment. Be sure to read this information carefully each time. Talk to your care team about the use of this medication in children. While it may be prescribed for children as young as 2 years for selected conditions, precautions do apply. Overdosage: If you think you have taken too much of this medicine contact a poison control center or emergency room at once. NOTE: This medicine is only for you. Do not share this medicine with others. What if I miss a dose? Keep appointments for follow-up doses. It is important not to miss your dose. Call your care team if you are unable to keep an appointment. What may interact with this medication? Interactions have not been studied. This list may not describe all possible interactions. Give your health care provider a list of all the medicines, herbs, non-prescription drugs, or dietary supplements you use. Also tell them if you smoke, drink alcohol, or use illegal drugs. Some items may interact with your medicine. What should I watch for while using this medication? Your condition will be monitored carefully while you are receiving this medication. You may need blood work while taking this medication. This medication may cause serious skin reactions. They can happen weeks to months after starting the medication. Contact your care team right away if you notice fevers or flu-like symptoms with a rash. The rash may be red or purple and then turn into blisters or peeling of the skin. You may also notice a red rash with swelling of the face, lips, or lymph nodes in your neck or under your arms. Tell your care team right away if you have any change in your eyesight. Talk to your care team if you may be pregnant. Serious birth defects can occur if you take this medication during pregnancy and for 5 months after the last dose. You will need a negative pregnancy test before starting this medication. Contraception is recommended while taking this medication  and for 5 months after the last dose. Your care team can help you find the option that works for you. Do not breastfeed while taking this medication and for at least 5 months after the last dose. What side effects may I notice from receiving this medication? Side effects that you should report to your doctor or health care professional as soon as possible: Allergic reactions--skin rash, itching, hives, swelling of the face, lips, tongue, or throat Dry cough, shortness of breath or trouble breathing Eye pain, redness, irritation, or discharge with blurry or decreased vision Heart muscle inflammation--unusual weakness or fatigue, shortness of breath, chest pain, fast or irregular heartbeat, dizziness, swelling of the ankles, feet, or hands Hormone gland problems--headache, sensitivity to light, unusual weakness or fatigue, dizziness, fast or irregular heartbeat, increased sensitivity to cold or heat, excessive sweating, constipation, hair loss, increased thirst or amount of urine, tremors or shaking, irritability Infusion reactions--chest pain, shortness of breath or trouble breathing, feeling faint or lightheaded Kidney injury (glomerulonephritis)--decrease in the amount of urine, red or dark brown urine, foamy or bubbly  urine, swelling of the ankles, hands, or feet Liver injury--right upper belly pain, loss of appetite, nausea, light-colored stool, dark yellow or brown urine, yellowing skin or eyes, unusual weakness or fatigue Pain, tingling, or numbness in the hands or feet, muscle weakness, change in vision, confusion or trouble speaking, loss of balance or coordination, trouble walking, seizures Rash, fever, and swollen lymph nodes Redness, blistering, peeling, or loosening of the skin, including inside the mouth Sudden or severe stomach pain, bloody diarrhea, fever, nausea, vomiting Side effects that usually do not require medical attention (report to your doctor or health care professional if  they continue or are bothersome): Bone, joint, or muscle pain Diarrhea Fatigue Loss of appetite Nausea Skin rash This list may not describe all possible side effects. Call your doctor for medical advice about side effects. You may report side effects to FDA at 1-800-FDA-1088. Where should I keep my medication? This medication is given in a hospital or clinic. It will not be stored at home. NOTE: This sheet is a summary. It may not cover all possible information. If you have questions about this medicine, talk to your doctor, pharmacist, or health care provider.  2023 Elsevier/Gold Standard (2022-03-22 00:00:00)  Docetaxel Injection What is this medication? DOCETAXEL (doe se TAX el) treats some types of cancer. It works by slowing down the growth of cancer cells. This medicine may be used for other purposes; ask your health care provider or pharmacist if you have questions. COMMON BRAND NAME(S): Docefrez, Taxotere What should I tell my care team before I take this medication? They need to know if you have any of these conditions: Kidney disease Liver disease Low white blood cell levels Tingling of the fingers or toes or other nerve disorder An unusual or allergic reaction to docetaxel, polysorbate 80, other medications, foods, dyes, or preservatives Pregnant or trying to get pregnant Breast-feeding How should I use this medication? This medication is injected into a vein. It is given by your care team in a hospital or clinic setting. Talk to your care team about the use of this medication in children. Special care may be needed. Overdosage: If you think you have taken too much of this medicine contact a poison control center or emergency room at once. NOTE: This medicine is only for you. Do not share this medicine with others. What if I miss a dose? Keep appointments for follow-up doses. It is important not to miss your dose. Call your care team if you are unable to keep an  appointment. What may interact with this medication? Do not take this medication with any of the following: Live virus vaccines This medication may also interact with the following: Certain antibiotics, such as clarithromycin, telithromycin Certain antivirals for HIV or hepatitis Certain medications for fungal infections, such as itraconazole, ketoconazole, voriconazole Grapefruit juice Nefazodone Supplements, such as St. John's wort This list may not describe all possible interactions. Give your health care provider a list of all the medicines, herbs, non-prescription drugs, or dietary supplements you use. Also tell them if you smoke, drink alcohol, or use illegal drugs. Some items may interact with your medicine. What should I watch for while using this medication? This medication may make you feel generally unwell. This is not uncommon as chemotherapy can affect healthy cells as well as cancer cells. Report any side effects. Continue your course of treatment even though you feel ill unless your care team tells you to stop. You may need blood work done while you  are taking this medication. This medication can cause serious side effects and infusion reactions. To reduce the risk, your care team may give you other medications to take before receiving this one. Be sure to follow the directions from your care team. This medication may increase your risk of getting an infection. Call your care team for advice if you get a fever, chills, sore throat, or other symptoms of a cold or flu. Do not treat yourself. Try to avoid being around people who are sick. Avoid taking medications that contain aspirin, acetaminophen, ibuprofen, naproxen, or ketoprofen unless instructed by your care team. These medications may hide a fever. Be careful brushing or flossing your teeth or using a toothpick because you may get an infection or bleed more easily. If you have any dental work done, tell your dentist you are  receiving this medication. Some products may contain alcohol. Ask your care team if this medication contains alcohol. Be sure to tell all care teams you are taking this medicine. Certain medications, like metronidazole and disulfiram, can cause an unpleasant reaction when taken with alcohol. The reaction includes flushing, headache, nausea, vomiting, sweating, and increased thirst. The reaction can last from 30 minutes to several hours. This medication may affect your coordination, reaction time, or judgement. Do not drive or operate machinery until you know how this medication affects you. Sit up or stand slowly to reduce the risk of dizzy or fainting spells. Drinking alcohol with this medication can increase the risk of these side effects. Talk to your care team about your risk of cancer. You may be more at risk for certain types of cancer if you take this medication. Talk to your care team if you wish to become pregnant or think you might be pregnant. This medication can cause serious birth defects if taken during pregnancy or if you get pregnant within 2 months after stopping therapy. A negative pregnancy test is required before starting this medication. A reliable form of contraception is recommended while taking this medication and for 2 months after stopping it. Talk to your care team about reliable forms of contraception. Do not breast-feed while taking this medication and for 1 week after stopping therapy. Use a condom during sex and for 4 months after stopping therapy. Tell your care team right away if you think your partner might be pregnant. This medication can cause serious birth defects. This medication may cause infertility. Talk to your care team if you are concerned about your fertility. What side effects may I notice from receiving this medication? Side effects that you should report to your care team as soon as possible: Allergic reactions--skin rash, itching, hives, swelling of the  face, lips, tongue, or throat Change in vision such as blurry vision, seeing halos around lights, vision loss Infection--fever, chills, cough, or sore throat Infusion reactions--chest pain, shortness of breath or trouble breathing, feeling faint or lightheaded Low red blood cell level--unusual weakness or fatigue, dizziness, headache, trouble breathing Pain, tingling, or numbness in the hands or feet Painful swelling, warmth, or redness of the skin, blisters or sores at the infusion site Redness, blistering, peeling, or loosening of the skin, including inside the mouth Sudden or severe stomach pain, bloody diarrhea, fever, nausea, vomiting Swelling of the ankles, hands, or feet Tumor lysis syndrome (TLS)--nausea, vomiting, diarrhea, decrease in the amount of urine, dark urine, unusual weakness or fatigue, confusion, muscle pain or cramps, fast or irregular heartbeat, joint pain Unusual bruising or bleeding Side effects that usually do  not require medical attention (report to your care team if they continue or are bothersome): Change in nail shape, thickness, or color Change in taste Hair loss Increased tears This list may not describe all possible side effects. Call your doctor for medical advice about side effects. You may report side effects to FDA at 1-800-FDA-1088. Where should I keep my medication? This medication is given in a hospital or clinic. It will not be stored at home. NOTE: This sheet is a summary. It may not cover all possible information. If you have questions about this medicine, talk to your doctor, pharmacist, or health care provider.  2023 Elsevier/Gold Standard (2008-01-20 00:00:00)  Carboplatin Injection What is this medication? CARBOPLATIN (KAR boe pla tin) treats some types of cancer. It works by slowing down the growth of cancer cells. This medicine may be used for other purposes; ask your health care provider or pharmacist if you have questions. COMMON BRAND  NAME(S): Paraplatin What should I tell my care team before I take this medication? They need to know if you have any of these conditions: Blood disorders Hearing problems Kidney disease Recent or ongoing radiation therapy An unusual or allergic reaction to carboplatin, cisplatin, other medications, foods, dyes, or preservatives Pregnant or trying to get pregnant Breast-feeding How should I use this medication? This medication is injected into a vein. It is given by your care team in a hospital or clinic setting. Talk to your care team about the use of this medication in children. Special care may be needed. Overdosage: If you think you have taken too much of this medicine contact a poison control center or emergency room at once. NOTE: This medicine is only for you. Do not share this medicine with others. What if I miss a dose? Keep appointments for follow-up doses. It is important not to miss your dose. Call your care team if you are unable to keep an appointment. What may interact with this medication? Medications for seizures Some antibiotics, such as amikacin, gentamicin, neomycin, streptomycin, tobramycin Vaccines This list may not describe all possible interactions. Give your health care provider a list of all the medicines, herbs, non-prescription drugs, or dietary supplements you use. Also tell them if you smoke, drink alcohol, or use illegal drugs. Some items may interact with your medicine. What should I watch for while using this medication? Your condition will be monitored carefully while you are receiving this medication. You may need blood work while taking this medication. This medication may make you feel generally unwell. This is not uncommon, as chemotherapy can affect healthy cells as well as cancer cells. Report any side effects. Continue your course of treatment even though you feel ill unless your care team tells you to stop. In some cases, you may be given additional  medications to help with side effects. Follow all directions for their use. This medication may increase your risk of getting an infection. Call your care team for advice if you get a fever, chills, sore throat, or other symptoms of a cold or flu. Do not treat yourself. Try to avoid being around people who are sick. Avoid taking medications that contain aspirin, acetaminophen, ibuprofen, naproxen, or ketoprofen unless instructed by your care team. These medications may hide a fever. Be careful brushing or flossing your teeth or using a toothpick because you may get an infection or bleed more easily. If you have any dental work done, tell your dentist you are receiving this medication. Talk to your care team  if you wish to become pregnant or think you might be pregnant. This medication can cause serious birth defects. Talk to your care team about effective forms of contraception. Do not breast-feed while taking this medication. What side effects may I notice from receiving this medication? Side effects that you should report to your care team as soon as possible: Allergic reactions--skin rash, itching, hives, swelling of the face, lips, tongue, or throat Infection--fever, chills, cough, sore throat, wounds that don't heal, pain or trouble when passing urine, general feeling of discomfort or being unwell Low red blood cell level--unusual weakness or fatigue, dizziness, headache, trouble breathing Pain, tingling, or numbness in the hands or feet, muscle weakness, change in vision, confusion or trouble speaking, loss of balance or coordination, trouble walking, seizures Unusual bruising or bleeding Side effects that usually do not require medical attention (report to your care team if they continue or are bothersome): Hair loss Nausea Unusual weakness or fatigue Vomiting This list may not describe all possible side effects. Call your doctor for medical advice about side effects. You may report side  effects to FDA at 1-800-FDA-1088. Where should I keep my medication? This medication is given in a hospital or clinic. It will not be stored at home. NOTE: This sheet is a summary. It may not cover all possible information. If you have questions about this medicine, talk to your doctor, pharmacist, or health care provider.  2023 Elsevier/Gold Standard (2008-01-20 00:00:00)

## 2023-03-07 NOTE — Progress Notes (Signed)
[facility] CONSULT NOTE  Patient Care Team: Jodi Marble, MD as PCP - General (Internal Medicine) Daiva Huge, RN as Oncology Nurse Navigator  CANCER STAGING   Cancer Staging  Breast cancer [facility]) Staging form: Breast, AJCC 8th Edition - Clinical: Stage IIB (cT2, cN1, cM0, G3, ER-, PR-, HER2+) - Signed by Jane Canary, MD on 02/17/2023 Histologic grading system: 3 grade system   ASSESSMENT & PLAN:  Cristina Baker 37 y.o. 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  # Left breast invasive ductal cancer, ER/PR negative, HER2 positive, stage IIb -Self detected left breast mass.  Status post biopsy on 02/10/2023.  -CT chest abdomen pelvis and bone scan were reviewed. Ill-defined mixed focus in the posterior right ninth rib corresponding with increased radiotracer uptake on the bone scan.  Nonspecific but suspicious for osseous metastatic disease.  I discussed the finding with Dr. Laurence Ferrari of IR who advised that the lesion is very small in size about 6 mm and is difficult to biopsy.  Recommend short interval CT scan.  Patient denies any prior history of trauma.  At this time, I will treat the patient as a stage II with TCHP regimen.  I will repeat CT scan after 2 cycles to reassess the rib lesion.  Side effects of the treatment will be discussed.  Echo was reviewed and ejection fraction of 55%.  Labs reviewed and acceptable.  Will proceed with cycle 1 of TCHP neoadjuvant regimen.  Neulasta injection on day 3 we discussed about using dexamethasone.  She took 2 tablets yesterday.  And she will take for 2 more days is after chemotherapy.  She has Zofran and Compazine as needed for nausea.  She also has Imodium as needed for potential diarrhea.  She has Claritin for possibility of bone pain from Neulasta injection.  -I will also proceed with  Lupron injection 11.25 mg 59-month dose for ovarian hormone preservation.  She has completed family-planning and had tubal ligation.  # Family history of breast cancer -Genetic testing was negative  # Anxiety -Xanax 0.25 mg twice daily as needed  # History of palpitations  -Reportedly, she had workup with cardiology which was negative.  It was deemed secondary to panic. -On metoprolol.  # ADHD -on Adderall  # Access-port placed by Dr. Lysle Pearl.  Has Emla cream  Orders Placed This Encounter  Procedures   CBC with Differential ([facility] Only)    Standing Status:   Future    Standing Expiration Date:   03/27/2024   CMP ([facility] only)    Standing Status:   Future    Standing Expiration Date:   03/27/2024   RTC in 3 weeks for MD visit, labs, cycle 2 TCHP  The total time spent in the appointment was 30 minutes encounter with patients including review of chart and various tests results, discussions about plan of care and coordination of care plan   All questions were answered. The patient knows to call the clinic with any problems, questions or concerns. No barriers to learning was detected.  Jane Canary, MD 3/25/202412:49 PM   HISTORY OF PRESENTING ILLNESS:  Cristina Baker 37 y.o. 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  She denies any pain or tenderness in the  breast.  Denies any discharge.  There is family history of breast cancer in paternal grandmother and 2 aunts.  She had a BRCA testing about 15 years ago which was negative.  She has completed family-planning.  Underwent tubal ligation.  She has 3 children 1 in 65s second in his teen and the youngest is 39 years old.    Interval history She was seen today accompanied by husband prior to cycle 1 of TCHP. Feeling well overall.  Coping well with the new cancer diagnosis.  Denies any  issues with the port. Patient denies fever, chills, nausea, vomiting, shortness of breath, cough, abdominal pain, bleeding, bowel or bladder issues. Energy level is good.  Appetite is good.  Denies any weight loss. Denies pain.   I have reviewed her chart and materials related to her cancer extensively and collaborated history with the patient. Summary of oncologic history is as follows: Oncology History  Breast cancer ([facility])  01/26/2023 Initial Diagnosis   Patient felt palpable lump in left breast   02/10/2023 Mammogram     IMPRESSION: 1. There is a suspicious 24 mm mass at the site of palpable concern which is concerning for malignancy. It demonstrates 3 cm of associated calcifications. Recommend ultrasound-guided biopsy for definitive characterization with attention on post marker placement mammogram to assess for extent of calcifications in relation to the biopsy marking clip. 2. There is an indeterminate LEFT axillary lymph node. Recommend ultrasound-guided biopsy for definitive characterization. 3. No mammographic evidence of malignancy in the RIGHT breast.   02/10/2023 Pathology Results   DIAGNOSIS: A.  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  Size of invasive carcinoma: 1.7 cm in linear length in this sample Histologic grade of invasive carcinoma: Grade 3 Glandular/tubular differentiation score: 3 Nuclear pleomorphism score: 3 Mitotic rate score: 2 Total score: 8 Ductal carcinoma in situ: Present, high-grade Lymphovascular invasion: Indeterminate See comment.  B.  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   Estrogen Receptor (ER) Status: NEGATIVE (LESS THAN 1%)         Internal control cells positive Progesterone Receptor (PgR) Status: Negative HER2 (by immunohistochemistry): Positive Ki-67: Not performed    02/17/2023 Cancer Staging    Staging form: Breast, AJCC 8th Edition - Clinical: Stage IIB (cT2, cN1, cM0, G3, ER-, PR-, HER2+) - Signed by Jane Canary, MD on 02/17/2023 Histologic grading system: 3 grade system   03/07/2023 -  Chemotherapy   Patient is on Treatment Plan : BREAST  Docetaxel + Carboplatin + Trastuzumab + Pertuzumab  (TCHP) q21d       Genetic Testing   Negative genetic testing. No pathogenic variants identified on the [facility] Multi-Cancer+RNA panel. The report date is 03/01/2023.  The Multi-Cancer + RNA Panel offered by [facility] includes sequencing and/or deletion/duplication analysis of the following 70 genes:  AIP*, ALK, APC*, ATM*, AXIN2*, BAP1*, BARD1*, BLM*, BMPR1A*, BRCA1*, BRCA2*, BRIP1*, CDC73*, CDH1*, CDK4, CDKN1B*, CDKN2A, CHEK2*, CTNNA1*, DICER1*, EPCAM, EGFR, FH*, FLCN*, GREM1, HOXB13, KIT, LZTR1, MAX*, MBD4, MEN1*, MET, MITF, MLH1*, MSH2*, MSH3*, MSH6*, MUTYH*, NF1*, NF2*, NTHL1*, PALB2*, PDGFRA, PMS2*, POLD1*, POLE*, POT1*, PRKAR1A*, PTCH1*, PTEN*, RAD51C*, RAD51D*, RB1*, RET, SDHA*, SDHAF2*, SDHB*, SDHC*, SDHD*, SMAD4*, SMARCA4*, SMARCB1*, SMARCE1*, STK11*, SUFU*, TMEM127*, TP53*, TSC1*, TSC2*, VHL*. RNA analysis is performed for * genes.     MEDICAL HISTORY:  Past Medical History:  Diagnosis Date   Abnormal Pap smear of cervix    Adult ADHD  BRCA negative 2015   MyRisk neg; IBIS=18/8%   Breast cancer ([facility]) 01/2023   left   Family history of breast cancer 2015   IBIS=18.8%   Family history of ovarian cancer    Herpes genitalis    Lichen sclerosus    PAC (premature atrial contraction)    PONV (postoperative nausea and vomiting)    nausea   PVC's (premature ventricular contractions)    Tachycardia, unspecified     SURGICAL HISTORY: Past Surgical History:  Procedure Laterality Date   BREAST BIOPSY Left 02/10/2023   [place] Core Bx, Coil Clip - path pending   BREAST BIOPSY Left 02/10/2023   [place] Core Hydromark clip path pending   BREAST BIOPSY Left 02/10/2023   [place] LT BREAST BX W LOC  DEV 1ST LESION IMG BX SPEC US GUIDE 02/10/2023 [facility]   CESAREAN SECTION  01/03/2007   CESAREAN SECTION WITH BILATERAL TUBAL LIGATION N/A 03/26/2016   Procedure: CESAREAN SECTION WITH BILATERAL TUBAL LIGATION;  Surgeon: Will Bonnet, MD;  Location: [facility] OR[facility];  Service: Obstetrics;  Laterality: N/A;   COLPOSCOPY     IUD REMOVAL     PORTACATH PLACEMENT N/A 02/24/2023   Procedure: INSERTION PORT-A-CATH;  Surgeon: Benjamine Sprague, DO;  Location: [facility] OR[facility];  Service: General;  Laterality: N/A;    SOCIAL HISTORY: Social History   Socioeconomic History   Marital status: Married    Spouse name: Denyse Amass   Number of children: 3   Years of education: Not on file   Highest education level: Not on file  Occupational History   Not on file  Tobacco Use   Smoking status: Former    Types: Cigarettes   Smokeless tobacco: Never  Vaping Use   Vaping Use: Never used  Substance and Sexual Activity   Alcohol use: Yes    Comment: rarely   Drug use: No   Sexual activity: Yes    Birth control/protection: Surgical    Comment: tubal ligation  Other Topics Concern   Not on file  Social History Narrative   Not on file   Social Determinants of Health   Financial Resource Strain: Not on file  Food Insecurity: Unknown (02/17/2023)   Hunger Vital Sign    Worried About Running Out of Food in the Last Year: Never true    Ran Out of Food in the Last Year: Not on file  Transportation Needs: No Transportation Needs (02/17/2023)   PRAPARE - Hydrologist (Medical): No    Lack of Transportation (Non-Medical): No  Physical Activity: Unknown (06/02/2018)   Exercise Vital Sign    Days of Exercise per Week: 2 days    Minutes of Exercise per Session: Not on file  Stress: Not on file  Social Connections: Not on file  Intimate Partner Violence: Not At Risk (02/17/2023)   Humiliation, Afraid, Rape, and Kick questionnaire    Fear of Current or Ex-Partner: No    Emotionally Abused:  No    Physically Abused: No    Sexually Abused: No    FAMILY HISTORY: Family History  Problem Relation Age of Onset   Hypercholesterolemia Mother    Diabetes Mellitus II Father    Hypertension Father    Breast cancer Paternal Aunt 41   Breast cancer Paternal Aunt 5   Breast cancer Paternal Grandmother    Throat cancer Paternal Grandfather    Heart attack Paternal Grandfather     ALLERGIES:  has No Known Allergies.  MEDICATIONS:  Current  Outpatient Medications  Medication Sig Dispense Refill   acetaminophen (TYLENOL) 325 MG tablet Take 2 tablets (650 mg total) by mouth every 8 (eight) hours as needed for mild pain. 40 tablet 0   ALPRAZolam (XANAX) 0.5 MG tablet USE AS DIRECTED TAKE HALF TAB TWICE DAILY FOR ANXIETY 15 tablet 2   clobetasol ointment (TEMOVATE) 0.05 % Apply to affected area twice daily for 2 weeks, then HS for 2 weeks and then M-W-F HS x 2 weeks 30 g 2   dexamethasone (DECADRON) 4 MG tablet Take 2 tabs by mouth 2 times daily starting day before chemo. Then take 2 tabs daily for 2 days starting day after chemo. Take with food. 30 tablet 1   escitalopram (LEXAPRO) 10 MG tablet Take 10 mg by mouth every morning.     HYDROcodone-acetaminophen (NORCO) 5-325 MG tablet Take 1 tablet by mouth every 6 (six) hours as needed for up to 6 doses for moderate pain. 6 tablet 0   ibuprofen (ADVIL) 800 MG tablet Take 1 tablet (800 mg total) by mouth every 8 (eight) hours as needed for mild pain or moderate pain. 30 tablet 0   lidocaine-prilocaine (EMLA) cream Apply to affected area once 30 g 3   metoprolol succinate (TOPROL-XL) 25 MG 24 hr tablet TAKE 1 TABLET BY MOUTH ONCE A DAY **MAKE FOLLOW UP APPT WITH DR. KHAN FOR REFILLS** 90 tablet 0   ondansetron (ZOFRAN) 8 MG tablet Take 1 tablet (8 mg total) by mouth every 8 (eight) hours as needed for nausea or vomiting. Start on the third day after chemotherapy. 30 tablet 1   ondansetron (ZOFRAN-ODT) 4 MG disintegrating tablet Take 1 tablet  (4 mg total) by mouth every 8 (eight) hours as needed for nausea or vomiting. 20 tablet 0   prochlorperazine (COMPAZINE) 10 MG tablet Take 1 tablet (10 mg total) by mouth every 6 (six) hours as needed for nausea or vomiting. 30 tablet 1   amphetamine-dextroamphetamine (ADDERALL) 20 MG tablet Take 1 tablet (20 mg total) by mouth 2 (two) times daily. 60 tablet 0   No current facility-administered medications for this visit.   Facility-Administered Medications Ordered in Other Visits  Medication Dose Route Frequency Provider Last Rate Last Admin   CARBOplatin (PARAPLATIN) 900 mg in sodium chloride 0.9 % 500 mL chemo infusion  900 mg Intravenous Once Jane Canary, MD       DOCEtaxel (TAXOTERE) 160 mg in sodium chloride 0.9 % 250 mL chemo infusion  75 mg/m2 (Treatment Plan Recorded) Intravenous Once Jane Canary, MD       heparin lock flush 100 unit/mL  500 Units Intracatheter Once PRN Jane Canary, MD       leuprolide (LUPRON) injection 11.25 mg  11.25 mg Intramuscular Once Jane Canary, MD       pertuzumab (PERJETA) 840 mg in sodium chloride 0.9 % 250 mL chemo infusion  840 mg Intravenous Once Jane Canary, MD        REVIEW OF SYSTEMS:   Pertinent information mentioned in HPI All other systems were reviewed with the patient and are negative.  PHYSICAL EXAMINATION: ECOG PERFORMANCE STATUS: 0 - Asymptomatic  Vitals:   03/07/23 0920 03/07/23 0954  BP: (!) 154/94 121/81  Pulse: 99 90  Resp: 15   Temp: 97.8 F (36.6 C)   SpO2: 97%    Filed Weights   03/07/23 0920  Weight: 189 lb (85.7 kg)    GENERAL:alert, no distress and comfortable SKIN: skin color, texture, turgor are normal, no  rashes or significant lesions EYES: normal, conjunctiva are pink and non-injected, sclera clear OROPHARYNX:no exudate, no erythema and lips, buccal mucosa, and tongue normal  NECK: supple, thyroid normal size, non-tender, without nodularity LYMPH:  no palpable lymphadenopathy in the  cervical, axillary or inguinal LUNGS: clear to auscultation and percussion with normal breathing effort HEART: regular rate & rhythm and no murmurs and no lower extremity edema ABDOMEN:abdomen soft, non-tender and normal bowel sounds Musculoskeletal:no cyanosis of digits and no clubbing  PSYCH: alert & oriented x 3 with fluent speech NEURO: no focal motor/sensory deficits  LABORATORY DATA:  I have reviewed the data as listed Lab Results  Component Value Date   WBC 14.9 (H) 03/07/2023   HGB 12.2 03/07/2023   HCT 36.6 03/07/2023   MCV 85.3 03/07/2023   PLT 439 (H) 03/07/2023   Recent Labs    06/01/22 1556 02/23/23 1224 03/07/23 0927  NA 137 134* 135  K 3.0* 3.8 3.6  CL 107 105 106  CO2 24 24 23   GLUCOSE 121* 94 120*  BUN 12 17 19   CREATININE 0.46 0.51 0.57  CALCIUM 9.2 9.1 9.4  GFRNONAA >60 >60 >60  PROT  --  7.4 7.6  ALBUMIN  --  4.1 3.9  AST  --  17 25  ALT  --  22 29  ALKPHOS  --  63 63  BILITOT  --  0.4 0.5    RADIOGRAPHIC STUDIES: I have personally reviewed the radiological images as listed and agreed with the findings in the report. CT CHEST W CONTRAST  Result Date: 03/01/2023 CLINICAL DATA:  History of breast cancer, invasive. Initial workup. * Tracking Code: BO * EXAM: CT CHEST, ABDOMEN, AND PELVIS WITH CONTRAST TECHNIQUE: Multidetector CT imaging of the chest, abdomen and pelvis was performed following the standard protocol during bolus administration of intravenous contrast. RADIATION DOSE REDUCTION: This exam was performed according to the departmental dose-optimization program which includes automated exposure control, adjustment of the mA and/or kV according to patient size and/or use of iterative reconstruction technique. CONTRAST:  168mL OMNIPAQUE IOHEXOL 300 MG/ML  SOLN COMPARISON:  Nuclear medicine bone scan 02/21/2023. And MRI abdomen March 21, 2019 FINDINGS: CT CHEST FINDINGS Cardiovascular: Right chest Port-A-Cath with tip at the superior cavoatrial  junction. Normal caliber thoracic aorta. No central pulmonary embolus on this nondedicated study. Normal size heart. No significant pericardial effusion/thickening. Mediastinum/Nodes: Enlarged left axillary lymph node measures 10 mm in short axis on image 16/2. No pathologically enlarged mediastinal or hilar lymph nodes. Gas fluid level in the esophagus. Lungs/Pleura: No suspicious pulmonary nodules or masses. No pleural effusion. No pneumothorax. Musculoskeletal: Ill-defined left breast mass with biopsy clip in place measures 2.7 cm on image 18/2. Ill-defined mixed focus in the posterior right ninth rib on image 99/4 corresponding with the increased radiotracer uptake seen on prior nuclear medicine bone scan. CT ABDOMEN PELVIS FINDINGS Hepatobiliary: No suspicious hepatic lesion. Gallbladder is unremarkable. No biliary ductal dilation. Pancreas: No pancreatic ductal dilation or evidence of acute inflammation. Spleen: No splenomegaly. Adrenals/Urinary Tract: Bilateral adrenal glands appear normal. Prominence of the right renal collecting system without discrete hydronephrosis. Kidneys demonstrate symmetric enhancement. Urinary bladder is nondistended limiting evaluation. Stomach/Bowel: No radiopaque enteric contrast material was administered. Stomach is unremarkable for degree of distension. No pathologic dilation of small or large bowel. No evidence of acute bowel inflammation. Vascular/Lymphatic: Normal caliber abdominal aorta. Smooth IVC contours. The portal, splenic and superior mesenteric veins are patent. No pathologically enlarged abdominal or pelvic lymph nodes. Reproductive:  Nodular thickening of the fundal endometrium. No suspicious adnexal mass. Other: No significant abdominopelvic free fluid. Musculoskeletal: No aggressive lytic or blastic lesion of bone. IMPRESSION: 1. Ill-defined 2.7 cm left breast mass with biopsy clip in place, consistent with patient's known primary breast neoplasm. 2. Enlarged left  axillary lymph node, compatible with local nodal metastatic disease. 3. Ill-defined mixed focus in the posterior right ninth rib corresponding with the increased radiotracer uptake seen on prior nuclear medicine bone scan, nonspecific but suspicious for early osseous metastatic disease. Consider attention on short-term interval follow-up chest CT. 4. No evidence of metastatic disease within the abdomen or pelvis. 5. Nodular thickening of the fundal endometrium, nonspecific possibly physiologic. Consider further evaluation with dedicated pelvic ultrasound. 6. Prominence of the right renal collecting system without discrete hydronephrosis. 7. Gas fluid level in the esophagus, suggestive of gastroesophageal reflux. Electronically Signed   By: Dahlia Bailiff M.D.   On: 03/01/2023 11:25   CT Abdomen Pelvis W Contrast  Result Date: 03/01/2023 CLINICAL DATA:  History of breast cancer, invasive. Initial workup. * Tracking Code: BO * EXAM: CT CHEST, ABDOMEN, AND PELVIS WITH CONTRAST TECHNIQUE: Multidetector CT imaging of the chest, abdomen and pelvis was performed following the standard protocol during bolus administration of intravenous contrast. RADIATION DOSE REDUCTION: This exam was performed according to the departmental dose-optimization program which includes automated exposure control, adjustment of the mA and/or kV according to patient size and/or use of iterative reconstruction technique. CONTRAST:  159mL OMNIPAQUE IOHEXOL 300 MG/ML  SOLN COMPARISON:  Nuclear medicine bone scan 02/21/2023. And MRI abdomen March 21, 2019 FINDINGS: CT CHEST FINDINGS Cardiovascular: Right chest Port-A-Cath with tip at the superior cavoatrial junction. Normal caliber thoracic aorta. No central pulmonary embolus on this nondedicated study. Normal size heart. No significant pericardial effusion/thickening. Mediastinum/Nodes: Enlarged left axillary lymph node measures 10 mm in short axis on image 16/2. No pathologically enlarged  mediastinal or hilar lymph nodes. Gas fluid level in the esophagus. Lungs/Pleura: No suspicious pulmonary nodules or masses. No pleural effusion. No pneumothorax. Musculoskeletal: Ill-defined left breast mass with biopsy clip in place measures 2.7 cm on image 18/2. Ill-defined mixed focus in the posterior right ninth rib on image 99/4 corresponding with the increased radiotracer uptake seen on prior nuclear medicine bone scan. CT ABDOMEN PELVIS FINDINGS Hepatobiliary: No suspicious hepatic lesion. Gallbladder is unremarkable. No biliary ductal dilation. Pancreas: No pancreatic ductal dilation or evidence of acute inflammation. Spleen: No splenomegaly. Adrenals/Urinary Tract: Bilateral adrenal glands appear normal. Prominence of the right renal collecting system without discrete hydronephrosis. Kidneys demonstrate symmetric enhancement. Urinary bladder is nondistended limiting evaluation. Stomach/Bowel: No radiopaque enteric contrast material was administered. Stomach is unremarkable for degree of distension. No pathologic dilation of small or large bowel. No evidence of acute bowel inflammation. Vascular/Lymphatic: Normal caliber abdominal aorta. Smooth IVC contours. The portal, splenic and superior mesenteric veins are patent. No pathologically enlarged abdominal or pelvic lymph nodes. Reproductive: Nodular thickening of the fundal endometrium. No suspicious adnexal mass. Other: No significant abdominopelvic free fluid. Musculoskeletal: No aggressive lytic or blastic lesion of bone. IMPRESSION: 1. Ill-defined 2.7 cm left breast mass with biopsy clip in place, consistent with patient's known primary breast neoplasm. 2. Enlarged left axillary lymph node, compatible with local nodal metastatic disease. 3. Ill-defined mixed focus in the posterior right ninth rib corresponding with the increased radiotracer uptake seen on prior nuclear medicine bone scan, nonspecific but suspicious for early osseous metastatic disease.  Consider attention on short-term interval follow-up chest CT. 4. No  evidence of metastatic disease within the abdomen or pelvis. 5. Nodular thickening of the fundal endometrium, nonspecific possibly physiologic. Consider further evaluation with dedicated pelvic ultrasound. 6. Prominence of the right renal collecting system without discrete hydronephrosis. 7. Gas fluid level in the esophagus, suggestive of gastroesophageal reflux. Electronically Signed   By: Dahlia Bailiff M.D.   On: 03/01/2023 11:25   DG Chest Port 1 View  Result Date: 02/24/2023 CLINICAL DATA:  Port cath placement EXAM: PORTABLE CHEST 1 VIEW COMPARISON:  06/01/2022 FINDINGS: Right chest port catheter, tip projecting over the inferior portion of the SVC. The heart size and mediastinal contours are within normal limits. Both lungs are clear. The visualized skeletal structures are unremarkable. IMPRESSION: Right chest port catheter, tip projecting over the inferior portion of the SVC. No acute abnormality of the lungs. Electronically Signed   By: Delanna Ahmadi M.D.   On: 02/24/2023 16:08   DG C-Arm 1-60 Min-No Report  Result Date: 02/24/2023 Fluoroscopy was utilized by the requesting physician.  No radiographic interpretation.   NM Bone Scan Whole Body  Result Date: 02/21/2023 CLINICAL DATA:  History of invasive breast cancer, initial workup. EXAM: NUCLEAR MEDICINE WHOLE BODY BONE SCAN TECHNIQUE: Whole body anterior and posterior images were obtained approximately 3 hours after intravenous injection of radiopharmaceutical. RADIOPHARMACEUTICALS:  21.39 mCi Technetium-51m MDP IV COMPARISON:  MRI abdomen March 21, 2019. FINDINGS: Focus of abnormal radiotracer avidity in the posterior ninth rib. Focus of abnormal radiotracer avidity in the right mandible is commonly odontogenic. Multifocal radiotracer uptake in a pattern most consistent with degenerative arthropathy. Mild right hydronephrosis. IMPRESSION: Focus of abnormal radiotracer activity  in the posterior ninth rib, statistically more likely to reflect sequela of prior trauma however metastatic disease is not excluded. Suggest correlation with chest CT. Focus of abnormal radiotracer avidity in the right mandible is commonly odontogenic, suggest correlation with direct visualization. Mild right hydronephrosis consider correlation with CT abdomen and pelvis. Electronically Signed   By: Dahlia Bailiff M.D.   On: 02/21/2023 15:26   [place] LT BREAST BX W LOC DEV 1ST LESION IMG BX SPEC US GUIDE  Addendum Date: 02/11/2023   ADDENDUM REPORT: 02/11/2023 14:44 ADDENDUM: PATHOLOGY revealed: Site A. 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 Size of invasive carcinoma: 1.7 cm in linear length in this sample. Grade 3. Ductal carcinoma in situ: Present, high-grade. Lymphovascular invasion: Indeterminate Pathology results are CONCORDANT with imaging findings, per Dr. Valentino Saxon. PATHOLOGY revealed: Site B. 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 - SEE COMMENT. Pathology results are CONCORDANT with imaging findings, per Dr. Valentino Saxon. Pathology results and recommendations below were discussed with patient by telephone on 02/11/2023. Patient reported biopsy site within normal limits with slight tenderness at the site. Post biopsy care instructions were reviewed, questions were answered and my direct phone number was provided to patient. Patient was instructed to call [facility] if any concerns or questions arise related to the biopsy. RECOMMENDATIONS: 1. Surgical and oncological consultation. Request for surgical and oncological consultation relayed to Casper Harrison RN at [facility] by Electa Sniff RN on 02/11/2023. 2. Consider pretreatment bilateral breast MRI with and without contrast given patient's relatively young age and to evaluate extent of  breast disease. Pathology results reported by Electa Sniff RN on 02/11/2023. Electronically Signed   By: Valentino Saxon M.D.   On: 02/11/2023 14:44   Result Date: 02/11/2023  CLINICAL DATA:  Suspicious palpable LEFT breast mass. Enlarged LEFT axillary lymph node EXAM: ULTRASOUND GUIDED LEFT BREAST CORE NEEDLE BIOPSY [place] AXILLARY NODE CORE BIOPSY LEFT COMPARISON:  Previous exam(s). PROCEDURE: I met with the patient and we discussed the procedure of ultrasound-guided biopsy, including benefits and alternatives. We discussed the high likelihood of a successful procedure. We discussed the risks of the procedure, including infection, bleeding, tissue injury, clip migration, and inadequate sampling. Informed written consent was given. The usual time-out protocol was performed immediately prior to the procedure. Site 1: LEFT breast 2 o'clock 5 cm from the nipple Lesion quadrant: Upper outer quadrant Using sterile technique and 1% lidocaine and 1% lidocaine with epinephrine as local anesthetic, under direct ultrasound visualization, a 14 gauge spring-loaded device was used to perform biopsy of a mass at 2 o'clock using a lateral approach. At the conclusion of the procedure a COIL shaped tissue marker clip was deployed into the biopsy cavity. Follow up 2 view mammogram was performed and dictated separately. Site 2: LEFT axillary lymph node Lesion quadrant: Upper outer quadrant Using sterile technique and 1% lidocaine and 1% lidocaine with epinephrine as local anesthetic, under direct ultrasound visualization, a 14 gauge spring-loaded device was used to perform biopsy of LEFT axillary lymph node using an inferior approach. At the conclusion of the procedure a HYDROMARK shaped tissue marker clip was deployed into the biopsy cavity. Follow up 2 view mammogram was performed and dictated separately. IMPRESSION: 1. Ultrasound guided biopsy of a suspicious LEFT breast mass and enlarged LEFT axillary lymph node. No apparent  complications. 2. With malignant results, recommend pre treatment breast MRI with and without contrast. 3. Of note, suspicious LEFT breast mass demonstrates associated calcifications which span approximately 3 cm. These appear to be within 1 cm of the margins of the suspicious mass. If breast conservation surgery is pursued, recommend complete excision of these calcifications. Electronically Signed: By: Valentino Saxon M.D. On: 02/10/2023 12:53   [place] AXILLARY NODE CORE BIOPSY LEFT  Addendum Date: 02/11/2023   ADDENDUM REPORT: 02/11/2023 14:44 ADDENDUM: PATHOLOGY revealed: Site A. 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 Size of invasive carcinoma: 1.7 cm in linear length in this sample. Grade 3. Ductal carcinoma in situ: Present, high-grade. Lymphovascular invasion: Indeterminate Pathology results are CONCORDANT with imaging findings, per Dr. Valentino Saxon. PATHOLOGY revealed: Site B. 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 - SEE COMMENT. Pathology results are CONCORDANT with imaging findings, per Dr. Valentino Saxon. Pathology results and recommendations below were discussed with patient by telephone on 02/11/2023. Patient reported biopsy site within normal limits with slight tenderness at the site. Post biopsy care instructions were reviewed, questions were answered and my direct phone number was provided to patient. Patient was instructed to call [facility] if any concerns or questions arise related to the biopsy. RECOMMENDATIONS: 1. Surgical and oncological consultation. Request for surgical and oncological consultation relayed to Casper Harrison RN at [facility] by Electa Sniff RN on 02/11/2023. 2. Consider pretreatment bilateral breast MRI with and without contrast given patient's relatively young age and to evaluate extent of breast disease.  Pathology results reported by Electa Sniff RN on 02/11/2023. Electronically Signed   By: Valentino Saxon M.D.   On: 02/11/2023 14:44   Result Date: 02/11/2023 CLINICAL DATA:  Suspicious palpable LEFT breast mass. Enlarged LEFT axillary lymph node EXAM: ULTRASOUND GUIDED LEFT BREAST CORE NEEDLE  BIOPSY [place] AXILLARY NODE CORE BIOPSY LEFT COMPARISON:  Previous exam(s). PROCEDURE: I met with the patient and we discussed the procedure of ultrasound-guided biopsy, including benefits and alternatives. We discussed the high likelihood of a successful procedure. We discussed the risks of the procedure, including infection, bleeding, tissue injury, clip migration, and inadequate sampling. Informed written consent was given. The usual time-out protocol was performed immediately prior to the procedure. Site 1: LEFT breast 2 o'clock 5 cm from the nipple Lesion quadrant: Upper outer quadrant Using sterile technique and 1% lidocaine and 1% lidocaine with epinephrine as local anesthetic, under direct ultrasound visualization, a 14 gauge spring-loaded device was used to perform biopsy of a mass at 2 o'clock using a lateral approach. At the conclusion of the procedure a COIL shaped tissue marker clip was deployed into the biopsy cavity. Follow up 2 view mammogram was performed and dictated separately. Site 2: LEFT axillary lymph node Lesion quadrant: Upper outer quadrant Using sterile technique and 1% lidocaine and 1% lidocaine with epinephrine as local anesthetic, under direct ultrasound visualization, a 14 gauge spring-loaded device was used to perform biopsy of LEFT axillary lymph node using an inferior approach. At the conclusion of the procedure a HYDROMARK shaped tissue marker clip was deployed into the biopsy cavity. Follow up 2 view mammogram was performed and dictated separately. IMPRESSION: 1. Ultrasound guided biopsy of a suspicious LEFT breast mass and enlarged LEFT axillary lymph node. No apparent complications. 2. With  malignant results, recommend pre treatment breast MRI with and without contrast. 3. Of note, suspicious LEFT breast mass demonstrates associated calcifications which span approximately 3 cm. These appear to be within 1 cm of the margins of the suspicious mass. If breast conservation surgery is pursued, recommend complete excision of these calcifications. Electronically Signed: By: Valentino Saxon M.D. On: 02/10/2023 12:53   MM CLIP PLACEMENT LEFT  Result Date: 02/10/2023 CLINICAL DATA:  Status post 2 site ultrasound-guided biopsy EXAM: 3D DIAGNOSTIC LEFT MAMMOGRAM POST ULTRASOUND BIOPSY COMPARISON:  Previous exam(s). FINDINGS: 3D Mammographic images were obtained following ultrasound guided biopsy of a LEFT breast mass. The COIL biopsy marking clip is in expected position at the site of biopsy. Associated calcifications are within 1 cm of the margins of the mass. 3D Mammographic images were obtained following ultrasound guided biopsy of a LEFT axillary lymph node. [facility] biopsy marking clip is in expected position at the site of biopsy. It is at the site of enlarged LEFT axillary lymph node noted mammographically. IMPRESSION: 1. Appropriate positioning of the COIL shaped biopsy marking clip at the site of biopsy in the upper breast. Associated suspicious calcifications are within 1 cm of the margins of the suspicious mass. If breast conservation surgery is pursued, recommend complete excision of these associated calcifications. 2. Appropriate positioning of the HYDROMARK biopsy marking clip the site of biopsy in the LEFT axilla. This corresponds to the enlarged LEFT axillary lymph node noted mammographically. Final Assessment: Post Procedure Mammograms for Marker Placement Electronically Signed   By: Valentino Saxon M.D.   On: 02/10/2023 12:44  MM DIAG BREAST TOMO BILATERAL  Result Date: 02/10/2023 CLINICAL DATA:  LEFT breast palpable mass for 10 days. EXAM: DIGITAL DIAGNOSTIC BILATERAL  MAMMOGRAM WITH TOMOSYNTHESIS; ULTRASOUND LEFT BREAST LIMITED TECHNIQUE: Bilateral digital diagnostic mammography and breast tomosynthesis was performed.; Targeted ultrasound examination of the left breast was performed. COMPARISON:  None available. ACR Breast Density Category c: The breasts are heterogeneously dense, which may obscure small masses. FINDINGS: Spot compression tomosynthesis views  were obtained of the site of palpable concern. Subjacent to the site of palpable concern, there is an irregular mass with associated pleomorphic calcifications. Associated calcifications span approximately 3 cm and are associated predominately along the lateral and superior aspect of the mass. There is a prominent LEFT axillary lymph node noted on MLO view. No suspicious mass, distortion, or microcalcifications are identified to suggest presence of malignancy in the RIGHT breast. On physical exam, there is a hard mass in the upper breast. Targeted ultrasound was performed of the LEFT breast. At 2 o'clock 5 cm from nipple, there is an irregular hypoechoic mass with indistinct margins. It measures 24 x 18 x 16 mm. It demonstrates multiple echogenic foci consistent with calcifications noted mammographically. Targeted ultrasound was performed of the LEFT axilla. There is a single enlarged LEFT axillary lymph node with cortical thickening of approximately 7 mm. IMPRESSION: 1. There is a suspicious 24 mm mass at the site of palpable concern which is concerning for malignancy. It demonstrates 3 cm of associated calcifications. Recommend ultrasound-guided biopsy for definitive characterization with attention on post marker placement mammogram to assess for extent of calcifications in relation to the biopsy marking clip. 2. There is an indeterminate LEFT axillary lymph node. Recommend ultrasound-guided biopsy for definitive characterization. 3. No mammographic evidence of malignancy in the RIGHT breast. RECOMMENDATION: LEFT breast  ultrasound-guided biopsy x1 LEFT axillary ultrasound-guided biopsy x1 I have discussed the findings and recommendations with the patient. The biopsy procedure was discussed with the patient and questions were answered. Patient expressed their understanding of the biopsy recommendation. Patient will be scheduled for biopsy today. Ordering provider will be notified. If applicable, a reminder letter will be sent to the patient regarding the next appointment. BI-RADS CATEGORY  5: Highly suggestive of malignancy. Electronically Signed   By: Valentino Saxon M.D.   On: 02/10/2023 10:14  US BREAST LTD UNI LEFT INC AXILLA  Result Date: 02/10/2023 CLINICAL DATA:  LEFT breast palpable mass for 10 days. EXAM: DIGITAL DIAGNOSTIC BILATERAL MAMMOGRAM WITH TOMOSYNTHESIS; ULTRASOUND LEFT BREAST LIMITED TECHNIQUE: Bilateral digital diagnostic mammography and breast tomosynthesis was performed.; Targeted ultrasound examination of the left breast was performed. COMPARISON:  None available. ACR Breast Density Category c: The breasts are heterogeneously dense, which may obscure small masses. FINDINGS: Spot compression tomosynthesis views were obtained of the site of palpable concern. Subjacent to the site of palpable concern, there is an irregular mass with associated pleomorphic calcifications. Associated calcifications span approximately 3 cm and are associated predominately along the lateral and superior aspect of the mass. There is a prominent LEFT axillary lymph node noted on MLO view. No suspicious mass, distortion, or microcalcifications are identified to suggest presence of malignancy in the RIGHT breast. On physical exam, there is a hard mass in the upper breast. Targeted ultrasound was performed of the LEFT breast. At 2 o'clock 5 cm from nipple, there is an irregular hypoechoic mass with indistinct margins. It measures 24 x 18 x 16 mm. It demonstrates multiple echogenic foci consistent with calcifications noted  mammographically. Targeted ultrasound was performed of the LEFT axilla. There is a single enlarged LEFT axillary lymph node with cortical thickening of approximately 7 mm. IMPRESSION: 1. There is a suspicious 24 mm mass at the site of palpable concern which is concerning for malignancy. It demonstrates 3 cm of associated calcifications. Recommend ultrasound-guided biopsy for definitive characterization with attention on post marker placement mammogram to assess for extent of calcifications in relation to the biopsy marking clip. 2.  There is an indeterminate LEFT axillary lymph node. Recommend ultrasound-guided biopsy for definitive characterization. 3. No mammographic evidence of malignancy in the RIGHT breast. RECOMMENDATION: LEFT breast ultrasound-guided biopsy x1 LEFT axillary ultrasound-guided biopsy x1 I have discussed the findings and recommendations with the patient. The biopsy procedure was discussed with the patient and questions were answered. Patient expressed their understanding of the biopsy recommendation. Patient will be scheduled for biopsy today. Ordering provider will be notified. If applicable, a reminder letter will be sent to the patient regarding the next appointment. BI-RADS CATEGORY  5: Highly suggestive of malignancy. Electronically Signed   By: Valentino Saxon M.D.   On: 02/10/2023 10:14

## 2023-03-07 NOTE — Progress Notes (Signed)
Met with Ms. Riddell prior to first chemo.

## 2023-03-07 NOTE — Progress Notes (Signed)
BP recheck 121/81 HR 90

## 2023-03-08 ENCOUNTER — Telehealth: Payer: Self-pay

## 2023-03-08 ENCOUNTER — Ambulatory Visit: Payer: Managed Care, Other (non HMO)

## 2023-03-08 ENCOUNTER — Telehealth: Payer: Self-pay | Admitting: *Deleted

## 2023-03-08 NOTE — Telephone Encounter (Signed)
Telephone call to patient for follow up after receiving first infusion.   No answer but left message stating we were calling to check on them.  Encouraged patient to call for any questions or concerns.   

## 2023-03-08 NOTE — Telephone Encounter (Signed)
Received FMLA for Cristina Baker patient spouse. Form completed and sent fro doctor signature

## 2023-03-09 ENCOUNTER — Inpatient Hospital Stay: Payer: Managed Care, Other (non HMO)

## 2023-03-09 DIAGNOSIS — C50912 Malignant neoplasm of unspecified site of left female breast: Secondary | ICD-10-CM

## 2023-03-09 DIAGNOSIS — Z5111 Encounter for antineoplastic chemotherapy: Secondary | ICD-10-CM | POA: Diagnosis not present

## 2023-03-09 MED ORDER — PEGFILGRASTIM-CBQV 6 MG/0.6ML ~~LOC~~ SOSY
6.0000 mg | PREFILLED_SYRINGE | Freq: Once | SUBCUTANEOUS | Status: AC
  Administered 2023-03-09: 6 mg via SUBCUTANEOUS
  Filled 2023-03-09: qty 0.6

## 2023-03-10 ENCOUNTER — Encounter: Payer: Self-pay | Admitting: Internal Medicine

## 2023-03-10 NOTE — Telephone Encounter (Signed)
FMLA form completed and faxed to [employer]. Message sent to patient to let her know it is done via My Chart

## 2023-03-11 ENCOUNTER — Ambulatory Visit: Payer: Managed Care, Other (non HMO)

## 2023-03-14 ENCOUNTER — Other Ambulatory Visit: Payer: Self-pay

## 2023-03-14 ENCOUNTER — Inpatient Hospital Stay: Payer: Managed Care, Other (non HMO)

## 2023-03-14 ENCOUNTER — Inpatient Hospital Stay: Payer: Managed Care, Other (non HMO) | Attending: Internal Medicine

## 2023-03-14 ENCOUNTER — Other Ambulatory Visit: Payer: Self-pay | Admitting: *Deleted

## 2023-03-14 ENCOUNTER — Inpatient Hospital Stay (HOSPITAL_BASED_OUTPATIENT_CLINIC_OR_DEPARTMENT_OTHER): Payer: Managed Care, Other (non HMO) | Admitting: Nurse Practitioner

## 2023-03-14 VITALS — BP 115/76 | HR 112 | Temp 97.6°F | Resp 18

## 2023-03-14 DIAGNOSIS — R197 Diarrhea, unspecified: Secondary | ICD-10-CM | POA: Diagnosis present

## 2023-03-14 DIAGNOSIS — Z5189 Encounter for other specified aftercare: Secondary | ICD-10-CM | POA: Diagnosis not present

## 2023-03-14 DIAGNOSIS — C50912 Malignant neoplasm of unspecified site of left female breast: Secondary | ICD-10-CM

## 2023-03-14 DIAGNOSIS — G620 Drug-induced polyneuropathy: Secondary | ICD-10-CM | POA: Insufficient documentation

## 2023-03-14 DIAGNOSIS — Z8041 Family history of malignant neoplasm of ovary: Secondary | ICD-10-CM | POA: Insufficient documentation

## 2023-03-14 DIAGNOSIS — R112 Nausea with vomiting, unspecified: Secondary | ICD-10-CM | POA: Diagnosis not present

## 2023-03-14 DIAGNOSIS — M898X9 Other specified disorders of bone, unspecified site: Secondary | ICD-10-CM

## 2023-03-14 DIAGNOSIS — L270 Generalized skin eruption due to drugs and medicaments taken internally: Secondary | ICD-10-CM | POA: Insufficient documentation

## 2023-03-14 DIAGNOSIS — F419 Anxiety disorder, unspecified: Secondary | ICD-10-CM | POA: Diagnosis not present

## 2023-03-14 DIAGNOSIS — Z87891 Personal history of nicotine dependence: Secondary | ICD-10-CM | POA: Diagnosis not present

## 2023-03-14 DIAGNOSIS — Z171 Estrogen receptor negative status [ER-]: Secondary | ICD-10-CM | POA: Insufficient documentation

## 2023-03-14 DIAGNOSIS — R110 Nausea: Secondary | ICD-10-CM | POA: Diagnosis not present

## 2023-03-14 DIAGNOSIS — Z95828 Presence of other vascular implants and grafts: Secondary | ICD-10-CM

## 2023-03-14 DIAGNOSIS — R238 Other skin changes: Secondary | ICD-10-CM | POA: Diagnosis not present

## 2023-03-14 DIAGNOSIS — K521 Toxic gastroenteritis and colitis: Secondary | ICD-10-CM

## 2023-03-14 DIAGNOSIS — Z803 Family history of malignant neoplasm of breast: Secondary | ICD-10-CM | POA: Insufficient documentation

## 2023-03-14 DIAGNOSIS — T451X5A Adverse effect of antineoplastic and immunosuppressive drugs, initial encounter: Secondary | ICD-10-CM | POA: Insufficient documentation

## 2023-03-14 DIAGNOSIS — D6481 Anemia due to antineoplastic chemotherapy: Secondary | ICD-10-CM | POA: Insufficient documentation

## 2023-03-14 DIAGNOSIS — E876 Hypokalemia: Secondary | ICD-10-CM | POA: Diagnosis present

## 2023-03-14 DIAGNOSIS — Z808 Family history of malignant neoplasm of other organs or systems: Secondary | ICD-10-CM | POA: Insufficient documentation

## 2023-03-14 DIAGNOSIS — E860 Dehydration: Secondary | ICD-10-CM

## 2023-03-14 LAB — CBC WITH DIFFERENTIAL (CANCER CENTER ONLY)
Abs Immature Granulocytes: 1.72 10*3/uL — ABNORMAL HIGH (ref 0.00–0.07)
Basophils Absolute: 0 10*3/uL (ref 0.0–0.1)
Basophils Relative: 0 %
Eosinophils Absolute: 0 10*3/uL (ref 0.0–0.5)
Eosinophils Relative: 0 %
HCT: 42 % (ref 36.0–46.0)
Hemoglobin: 13.9 g/dL (ref 12.0–15.0)
Immature Granulocytes: 12 %
Lymphocytes Relative: 28 %
Lymphs Abs: 4.1 10*3/uL — ABNORMAL HIGH (ref 0.7–4.0)
MCH: 27.6 pg (ref 26.0–34.0)
MCHC: 33.1 g/dL (ref 30.0–36.0)
MCV: 83.5 fL (ref 80.0–100.0)
Monocytes Absolute: 2.7 10*3/uL — ABNORMAL HIGH (ref 0.1–1.0)
Monocytes Relative: 18 %
Neutro Abs: 6.3 10*3/uL (ref 1.7–7.7)
Neutrophils Relative %: 42 %
Platelet Count: 331 10*3/uL (ref 150–400)
RBC: 5.03 MIL/uL (ref 3.87–5.11)
RDW: 12.7 % (ref 11.5–15.5)
Smear Review: NORMAL
WBC Count: 14.8 10*3/uL — ABNORMAL HIGH (ref 4.0–10.5)
nRBC: 0.2 % (ref 0.0–0.2)

## 2023-03-14 LAB — CMP (CANCER CENTER ONLY)
ALT: 116 U/L — ABNORMAL HIGH (ref 0–44)
AST: 35 U/L (ref 15–41)
Albumin: 3.9 g/dL (ref 3.5–5.0)
Alkaline Phosphatase: 71 U/L (ref 38–126)
Anion gap: 9 (ref 5–15)
BUN: 20 mg/dL (ref 6–20)
CO2: 25 mmol/L (ref 22–32)
Calcium: 8.8 mg/dL — ABNORMAL LOW (ref 8.9–10.3)
Chloride: 97 mmol/L — ABNORMAL LOW (ref 98–111)
Creatinine: 0.82 mg/dL (ref 0.44–1.00)
GFR, Estimated: >60 mL/min (ref >60)
Glucose, Bld: 119 mg/dL — ABNORMAL HIGH (ref 70–99)
Potassium: 3.3 mmol/L — ABNORMAL LOW (ref 3.5–5.1)
Sodium: 131 mmol/L — ABNORMAL LOW (ref 135–145)
Total Bilirubin: 0.7 mg/dL (ref 0.3–1.2)
Total Protein: 7.4 g/dL (ref 6.5–8.1)

## 2023-03-14 LAB — MAGNESIUM: Magnesium: 1.8 mg/dL (ref 1.7–2.4)

## 2023-03-14 MED ORDER — POTASSIUM CHLORIDE CRYS ER 10 MEQ PO TBCR: 10.0000 meq | EXTENDED_RELEASE_TABLET | Freq: Every day | ORAL | 0 refills | Status: DC

## 2023-03-14 MED ORDER — OXYCODONE HCL 5 MG PO TABS: 2.5000 mg | ORAL_TABLET | Freq: Four times a day (QID) | ORAL | 0 refills | Status: DC | PRN

## 2023-03-14 MED ORDER — SODIUM CHLORIDE 0.9% FLUSH
10.0000 mL | Freq: Once | INTRAVENOUS | Status: AC
  Administered 2023-03-14: 10 mL via INTRAVENOUS
  Filled 2023-03-14: qty 10

## 2023-03-14 MED ORDER — SODIUM CHLORIDE 0.9 % IV SOLN
INTRAVENOUS | Status: DC
  Filled 2023-03-14 (×2): qty 250

## 2023-03-14 MED ORDER — HEPARIN SOD (PORK) LOCK FLUSH 100 UNIT/ML IV SOLN
500.0000 [IU] | Freq: Once | INTRAVENOUS | Status: AC
  Administered 2023-03-14: 500 [IU]
  Filled 2023-03-14: qty 5

## 2023-03-14 MED ORDER — DIPHENOXYLATE-ATROPINE 2.5-0.025 MG PO TABS: 1.0000 | ORAL_TABLET | Freq: Four times a day (QID) | ORAL | 0 refills | Status: DC | PRN

## 2023-03-14 NOTE — Progress Notes (Signed)
[facility] [place]. [facility] [address] [place], [place] [address] [phone] (phone) [phone] (fax)  Patient Care Team: Jodi Marble, MD as PCP - General (Internal Medicine) Daiva Huge, RN as Oncology Nurse Navigator   Name of the patient: Cristina Baker  [id number]  06/21/1986   Date of visit: 03/14/23  Diagnosis- Breast cancer  Chief complaint/ Reason for visit- diarrhea, pain  Heme/Onc history:  Oncology History  Breast cancer  01/26/2023 Initial Diagnosis   Patient felt palpable lump in left breast   02/10/2023 Mammogram     IMPRESSION: 1. There is a suspicious 24 mm mass at the site of palpable concern which is concerning for malignancy. It demonstrates 3 cm of associated calcifications. Recommend ultrasound-guided biopsy for definitive characterization with attention on post marker placement mammogram to assess for extent of calcifications in relation to the biopsy marking clip. 2. There is an indeterminate LEFT axillary lymph node. Recommend ultrasound-guided biopsy for definitive characterization. 3. No mammographic evidence of malignancy in the RIGHT breast.   02/10/2023 Pathology Results   DIAGNOSIS: A.  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  Size of invasive carcinoma: 1.7 cm in linear length in this sample Histologic grade of invasive carcinoma: Grade 3 Glandular/tubular differentiation score: 3 Nuclear pleomorphism score: 3 Mitotic rate score: 2 Total score: 8 Ductal carcinoma in situ: Present, high-grade Lymphovascular invasion: Indeterminate See comment.  B.  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   Estrogen Receptor (ER) Status: NEGATIVE (LESS THAN 1%)         Internal control cells  positive Progesterone Receptor (PgR) Status: Negative HER2 (by immunohistochemistry): Positive Ki-67: Not performed    02/17/2023 Cancer Staging   Staging form: Breast, AJCC 8th Edition - Clinical: Stage IIB (cT2, cN1, cM0, G3, ER-, PR-, HER2+) - Signed by Jane Canary, MD on 02/17/2023 Histologic grading system: 3 grade system   03/07/2023 -  Chemotherapy   Patient is on Treatment Plan : BREAST  Docetaxel + Carboplatin + Trastuzumab + Pertuzumab  (TCHP) q21d       Genetic Testing   Negative genetic testing. No pathogenic variants identified on the [facility] Multi-Cancer+RNA panel. The report date is 03/01/2023.  The Multi-Cancer + RNA Panel offered by [facility] includes sequencing and/or deletion/duplication analysis of the following 70 genes:  AIP*, ALK, APC*, ATM*, AXIN2*, BAP1*, BARD1*, BLM*, BMPR1A*, BRCA1*, BRCA2*, BRIP1*, CDC73*, CDH1*, CDK4, CDKN1B*, CDKN2A, CHEK2*, CTNNA1*, DICER1*, EPCAM, EGFR, FH*, FLCN*, GREM1, HOXB13, KIT, LZTR1, MAX*, MBD4, MEN1*, MET, MITF, MLH1*, MSH2*, MSH3*, MSH6*, MUTYH*, NF1*, NF2*, NTHL1*, PALB2*, PDGFRA, PMS2*, POLD1*, POLE*, POT1*, PRKAR1A*, PTCH1*, PTEN*, RAD51C*, RAD51D*, RB1*, RET, SDHA*, SDHAF2*, SDHB*, SDHC*, SDHD*, SMAD4*, SMARCA4*, SMARCB1*, SMARCE1*, STK11*, SUFU*, TMEM127*, TP53*, TSC1*, TSC2*, VHL*. RNA analysis is performed for * genes.     Interval history- Patient is 37 year old female diagnosed with breast cancer currently s/p cycle 1 of chemotherapy with neulasta support who presents to [facility] for ocmplaints of diarrhea, skin breakdown, bone pain, and nausea. She has taken imodium for diarrhea. Taking tylenol for pain. Has been using topical aquaphor for skin irritation but has ongoing watery loose stools. No fevers. Diarrhea has now resolved. Nausea resolved with antiemetics.   Review of systems- Review of Systems  Constitutional:  Positive for malaise/fatigue. Negative for chills, fever  and weight loss.  HENT:  Negative for  hearing loss, nosebleeds, sore throat and tinnitus.   Eyes:  Negative for blurred vision and double vision.  Respiratory:  Negative for cough, hemoptysis, shortness of breath and wheezing.   Cardiovascular:  Negative for chest pain, palpitations and leg swelling.  Gastrointestinal:  Positive for diarrhea, nausea and vomiting. Negative for abdominal pain, blood in stool, constipation and melena.  Genitourinary:  Negative for dysuria and urgency.  Musculoskeletal:  Positive for joint pain and myalgias. Negative for back pain and falls.  Skin:  Negative for itching and rash.       Per hpi  Neurological:  Positive for weakness. Negative for dizziness, tingling, sensory change, loss of consciousness and headaches.  Endo/Heme/Allergies:  Negative for environmental allergies. Does not bruise/bleed easily.  Psychiatric/Behavioral:  Negative for depression. The patient is not nervous/anxious and does not have insomnia.     No Known Allergies  Past Medical History:  Diagnosis Date   Abnormal Pap smear of cervix    Adult ADHD    BRCA negative 2015   MyRisk neg; IBIS=18/8%   Breast cancer ([place]) 01/2023   left   Family history of breast cancer 2015   IBIS=18.8%   Family history of ovarian cancer    Herpes genitalis    Lichen sclerosus    PAC (premature atrial contraction)    PONV (postoperative nausea and vomiting)    nausea   PVC's (premature ventricular contractions)    Tachycardia, unspecified     Past Surgical History:  Procedure Laterality Date   BREAST BIOPSY Left 02/10/2023   [place] Core Bx, Coil Clip - path pending   BREAST BIOPSY Left 02/10/2023   [place] Core Hydromark clip path pending   BREAST BIOPSY Left 02/10/2023   [place] LT BREAST BX W LOC DEV 1ST LESION IMG BX SPEC US GUIDE 02/10/2023 [facility]   CESAREAN SECTION  01/03/2007   CESAREAN SECTION WITH BILATERAL TUBAL LIGATION N/A 03/26/2016   Procedure: CESAREAN SECTION WITH BILATERAL TUBAL LIGATION;  Surgeon: Will Bonnet, MD;  Location: [facility] ORS;  Service: Obstetrics;  Laterality: N/A;   COLPOSCOPY     IUD REMOVAL     PORTACATH PLACEMENT N/A 02/24/2023   Procedure: INSERTION PORT-A-CATH;  Surgeon: Benjamine Sprague, DO;  Location: [facility] ORS;  Service: General;  Laterality: N/A;    Social History   Socioeconomic History   Marital status: Married    Spouse name: Denyse Amass   Number of children: 3   Years of education: Not on file   Highest education level: Not on file  Occupational History   Not on file  Tobacco Use   Smoking status: Former    Types: Cigarettes   Smokeless tobacco: Never  Vaping Use   Vaping Use: Never used  Substance and Sexual Activity   Alcohol use: Yes    Comment: rarely   Drug use: No   Sexual activity: Yes    Birth control/protection: Surgical    Comment: tubal ligation  Other Topics Concern   Not on file  Social History Narrative   Not on file   Social Determinants of Health   Financial Resource Strain: Not on file  Food Insecurity: Unknown (02/17/2023)   Hunger Vital Sign    Worried About Running Out of Food in the Last Year: Never true    Ran Out of Food in the Last Year: Not on file  Transportation Needs: No Transportation Needs (02/17/2023)   PRAPARE - Transportation  Lack of Transportation (Medical): No    Lack of Transportation (Non-Medical): No  Physical Activity: Unknown (06/02/2018)   Exercise Vital Sign    Days of Exercise per Week: 2 days    Minutes of Exercise per Session: Not on file  Stress: Not on file  Social Connections: Not on file  Intimate Partner Violence: Not At Risk (02/17/2023)   Humiliation, Afraid, Rape, and Kick questionnaire    Fear of Current or Ex-Partner: No    Emotionally Abused: No    Physically Abused: No    Sexually Abused: No    Family History  Problem Relation Age of Onset   Hypercholesterolemia Mother    Diabetes Mellitus II Father    Hypertension Father    Breast cancer Paternal Aunt 52   Breast cancer Paternal Aunt  68   Breast cancer Paternal Grandmother    Throat cancer Paternal Grandfather    Heart attack Paternal Grandfather      Current Outpatient Medications:    acetaminophen (TYLENOL) 325 MG tablet, Take 2 tablets (650 mg total) by mouth every 8 (eight) hours as needed for mild pain., Disp: 40 tablet, Rfl: 0   ALPRAZolam (XANAX) 0.5 MG tablet, USE AS DIRECTED TAKE HALF TAB TWICE DAILY FOR ANXIETY, Disp: 15 tablet, Rfl: 2   amphetamine-dextroamphetamine (ADDERALL) 20 MG tablet, Take 1 tablet (20 mg total) by mouth 2 (two) times daily., Disp: 60 tablet, Rfl: 0   clobetasol ointment (TEMOVATE) 0.05 %, Apply to affected area twice daily for 2 weeks, then HS for 2 weeks and then M-W-F HS x 2 weeks, Disp: 30 g, Rfl: 2   dexamethasone (DECADRON) 4 MG tablet, Take 2 tabs by mouth 2 times daily starting day before chemo. Then take 2 tabs daily for 2 days starting day after chemo. Take with food., Disp: 30 tablet, Rfl: 1   escitalopram (LEXAPRO) 10 MG tablet, Take 10 mg by mouth every morning., Disp: , Rfl:    HYDROcodone-acetaminophen (NORCO) 5-325 MG tablet, Take 1 tablet by mouth every 6 (six) hours as needed for up to 6 doses for moderate pain., Disp: 6 tablet, Rfl: 0   ibuprofen (ADVIL) 800 MG tablet, Take 1 tablet (800 mg total) by mouth every 8 (eight) hours as needed for mild pain or moderate pain., Disp: 30 tablet, Rfl: 0   lidocaine-prilocaine (EMLA) cream, Apply to affected area once, Disp: 30 g, Rfl: 3   metoprolol succinate (TOPROL-XL) 25 MG 24 hr tablet, TAKE 1 TABLET BY MOUTH ONCE A DAY **MAKE FOLLOW UP APPT WITH DR. KHAN FOR REFILLS**, Disp: 90 tablet, Rfl: 0   ondansetron (ZOFRAN) 8 MG tablet, Take 1 tablet (8 mg total) by mouth every 8 (eight) hours as needed for nausea or vomiting. Start on the third day after chemotherapy., Disp: 30 tablet, Rfl: 1   ondansetron (ZOFRAN-ODT) 4 MG disintegrating tablet, Take 1 tablet (4 mg total) by mouth every 8 (eight) hours as needed for nausea or vomiting.,  Disp: 20 tablet, Rfl: 0   potassium chloride (KLOR-CON M) 10 MEQ tablet, Take 1 tablet (10 mEq total) by mouth daily., Disp: 7 tablet, Rfl: 0   prochlorperazine (COMPAZINE) 10 MG tablet, Take 1 tablet (10 mg total) by mouth every 6 (six) hours as needed for nausea or vomiting., Disp: 30 tablet, Rfl: 1 No current facility-administered medications for this visit.  Facility-Administered Medications Ordered in Other Visits:    0.9 %  sodium chloride infusion, , Intravenous, Continuous, Borders, Kirt Boys, NP, Stopped at 03/14/23  1135  Physical exam:  Vitals:   03/14/23 1030  BP: 115/76  Pulse: (!) 112  Resp: 18  Temp: 97.6 F (36.4 C)  TempSrc: Tympanic   Physical Exam Vitals reviewed.  Constitutional:      Appearance: She is not ill-appearing.     Comments: Accompanied by spouse  Cardiovascular:     Rate and Rhythm: Normal rate and regular rhythm.  Pulmonary:     Effort: No respiratory distress.  Abdominal:     General: There is no distension.     Tenderness: There is no abdominal tenderness.  Skin:    General: Skin is warm.     Coloration: Skin is not pale.     Comments: Perianal irritation and some skin breakdown.   Neurological:     Mental Status: She is alert and oriented to person, place, and time.  Psychiatric:        Mood and Affect: Mood normal.        Behavior: Behavior normal.         Latest Ref Rng & Units 03/14/2023   10:28 AM  CMP  Glucose 70 - 99 mg/dL 119   BUN 6 - 20 mg/dL 20   Creatinine 0.44 - 1.00 mg/dL 0.82   Sodium 135 - 145 mmol/L 131   Potassium 3.5 - 5.1 mmol/L 3.3   Chloride 98 - 111 mmol/L 97   CO2 22 - 32 mmol/L 25   Calcium 8.9 - 10.3 mg/dL 8.8   Total Protein 6.5 - 8.1 g/dL 7.4   Total Bilirubin 0.3 - 1.2 mg/dL 0.7   Alkaline Phos 38 - 126 U/L 71   AST 15 - 41 U/L 35   ALT 0 - 44 U/L 116       Latest Ref Rng & Units 03/14/2023   10:29 AM  CBC  WBC 4.0 - 10.5 K/uL 14.8   Hemoglobin 12.0 - 15.0 g/dL 13.9   Hematocrit 36.0 - 46.0 %  42.0   Platelets 150 - 400 K/uL 331     CT CHEST W CONTRAST  Result Date: 03/01/2023 CLINICAL DATA:  History of breast cancer, invasive. Initial workup. * Tracking Code: BO * EXAM: CT CHEST, ABDOMEN, AND PELVIS WITH CONTRAST TECHNIQUE: Multidetector CT imaging of the chest, abdomen and pelvis was performed following the standard protocol during bolus administration of intravenous contrast. RADIATION DOSE REDUCTION: This exam was performed according to the departmental dose-optimization program which includes automated exposure control, adjustment of the mA and/or kV according to patient size and/or use of iterative reconstruction technique. CONTRAST:  145mL OMNIPAQUE IOHEXOL 300 MG/ML  SOLN COMPARISON:  Nuclear medicine bone scan 02/21/2023. And MRI abdomen March 21, 2019 FINDINGS: CT CHEST FINDINGS Cardiovascular: Right chest Port-A-Cath with tip at the superior cavoatrial junction. Normal caliber thoracic aorta. No central pulmonary embolus on this nondedicated study. Normal size heart. No significant pericardial effusion/thickening. Mediastinum/Nodes: Enlarged left axillary lymph node measures 10 mm in short axis on image 16/2. No pathologically enlarged mediastinal or hilar lymph nodes. Gas fluid level in the esophagus. Lungs/Pleura: No suspicious pulmonary nodules or masses. No pleural effusion. No pneumothorax. Musculoskeletal: Ill-defined left breast mass with biopsy clip in place measures 2.7 cm on image 18/2. Ill-defined mixed focus in the posterior right ninth rib on image 99/4 corresponding with the increased radiotracer uptake seen on prior nuclear medicine bone scan. CT ABDOMEN PELVIS FINDINGS Hepatobiliary: No suspicious hepatic lesion. Gallbladder is unremarkable. No biliary ductal dilation. Pancreas: No pancreatic ductal dilation or evidence  of acute inflammation. Spleen: No splenomegaly. Adrenals/Urinary Tract: Bilateral adrenal glands appear normal. Prominence of the right renal collecting  system without discrete hydronephrosis. Kidneys demonstrate symmetric enhancement. Urinary bladder is nondistended limiting evaluation. Stomach/Bowel: No radiopaque enteric contrast material was administered. Stomach is unremarkable for degree of distension. No pathologic dilation of small or large bowel. No evidence of acute bowel inflammation. Vascular/Lymphatic: Normal caliber abdominal aorta. Smooth IVC contours. The portal, splenic and superior mesenteric veins are patent. No pathologically enlarged abdominal or pelvic lymph nodes. Reproductive: Nodular thickening of the fundal endometrium. No suspicious adnexal mass. Other: No significant abdominopelvic free fluid. Musculoskeletal: No aggressive lytic or blastic lesion of bone. IMPRESSION: 1. Ill-defined 2.7 cm left breast mass with biopsy clip in place, consistent with patient's known primary breast neoplasm. 2. Enlarged left axillary lymph node, compatible with local nodal metastatic disease. 3. Ill-defined mixed focus in the posterior right ninth rib corresponding with the increased radiotracer uptake seen on prior nuclear medicine bone scan, nonspecific but suspicious for early osseous metastatic disease. Consider attention on short-term interval follow-up chest CT. 4. No evidence of metastatic disease within the abdomen or pelvis. 5. Nodular thickening of the fundal endometrium, nonspecific possibly physiologic. Consider further evaluation with dedicated pelvic ultrasound. 6. Prominence of the right renal collecting system without discrete hydronephrosis. 7. Gas fluid level in the esophagus, suggestive of gastroesophageal reflux. Electronically Signed   By: Dahlia Bailiff M.D.   On: 03/01/2023 11:25   CT Abdomen Pelvis W Contrast  Result Date: 03/01/2023 CLINICAL DATA:  History of breast cancer, invasive. Initial workup. * Tracking Code: BO * EXAM: CT CHEST, ABDOMEN, AND PELVIS WITH CONTRAST TECHNIQUE: Multidetector CT imaging of the chest, abdomen  and pelvis was performed following the standard protocol during bolus administration of intravenous contrast. RADIATION DOSE REDUCTION: This exam was performed according to the departmental dose-optimization program which includes automated exposure control, adjustment of the mA and/or kV according to patient size and/or use of iterative reconstruction technique. CONTRAST:  113mL OMNIPAQUE IOHEXOL 300 MG/ML  SOLN COMPARISON:  Nuclear medicine bone scan 02/21/2023. And MRI abdomen March 21, 2019 FINDINGS: CT CHEST FINDINGS Cardiovascular: Right chest Port-A-Cath with tip at the superior cavoatrial junction. Normal caliber thoracic aorta. No central pulmonary embolus on this nondedicated study. Normal size heart. No significant pericardial effusion/thickening. Mediastinum/Nodes: Enlarged left axillary lymph node measures 10 mm in short axis on image 16/2. No pathologically enlarged mediastinal or hilar lymph nodes. Gas fluid level in the esophagus. Lungs/Pleura: No suspicious pulmonary nodules or masses. No pleural effusion. No pneumothorax. Musculoskeletal: Ill-defined left breast mass with biopsy clip in place measures 2.7 cm on image 18/2. Ill-defined mixed focus in the posterior right ninth rib on image 99/4 corresponding with the increased radiotracer uptake seen on prior nuclear medicine bone scan. CT ABDOMEN PELVIS FINDINGS Hepatobiliary: No suspicious hepatic lesion. Gallbladder is unremarkable. No biliary ductal dilation. Pancreas: No pancreatic ductal dilation or evidence of acute inflammation. Spleen: No splenomegaly. Adrenals/Urinary Tract: Bilateral adrenal glands appear normal. Prominence of the right renal collecting system without discrete hydronephrosis. Kidneys demonstrate symmetric enhancement. Urinary bladder is nondistended limiting evaluation. Stomach/Bowel: No radiopaque enteric contrast material was administered. Stomach is unremarkable for degree of distension. No pathologic dilation of small  or large bowel. No evidence of acute bowel inflammation. Vascular/Lymphatic: Normal caliber abdominal aorta. Smooth IVC contours. The portal, splenic and superior mesenteric veins are patent. No pathologically enlarged abdominal or pelvic lymph nodes. Reproductive: Nodular thickening of the fundal endometrium. No suspicious adnexal mass.  Other: No significant abdominopelvic free fluid. Musculoskeletal: No aggressive lytic or blastic lesion of bone. IMPRESSION: 1. Ill-defined 2.7 cm left breast mass with biopsy clip in place, consistent with patient's known primary breast neoplasm. 2. Enlarged left axillary lymph node, compatible with local nodal metastatic disease. 3. Ill-defined mixed focus in the posterior right ninth rib corresponding with the increased radiotracer uptake seen on prior nuclear medicine bone scan, nonspecific but suspicious for early osseous metastatic disease. Consider attention on short-term interval follow-up chest CT. 4. No evidence of metastatic disease within the abdomen or pelvis. 5. Nodular thickening of the fundal endometrium, nonspecific possibly physiologic. Consider further evaluation with dedicated pelvic ultrasound. 6. Prominence of the right renal collecting system without discrete hydronephrosis. 7. Gas fluid level in the esophagus, suggestive of gastroesophageal reflux. Electronically Signed   By: Dahlia Bailiff M.D.   On: 03/01/2023 11:25   DG Chest Port 1 View  Result Date: 02/24/2023 CLINICAL DATA:  Port cath placement EXAM: PORTABLE CHEST 1 VIEW COMPARISON:  06/01/2022 FINDINGS: Right chest port catheter, tip projecting over the inferior portion of the SVC. The heart size and mediastinal contours are within normal limits. Both lungs are clear. The visualized skeletal structures are unremarkable. IMPRESSION: Right chest port catheter, tip projecting over the inferior portion of the SVC. No acute abnormality of the lungs. Electronically Signed   By: Delanna Ahmadi M.D.   On:  02/24/2023 16:08   DG C-Arm 1-60 Min-No Report  Result Date: 02/24/2023 Fluoroscopy was utilized by the requesting physician.  No radiographic interpretation.   NM Bone Scan Whole Body  Result Date: 02/21/2023 CLINICAL DATA:  History of invasive breast cancer, initial workup. EXAM: NUCLEAR MEDICINE WHOLE BODY BONE SCAN TECHNIQUE: Whole body anterior and posterior images were obtained approximately 3 hours after intravenous injection of radiopharmaceutical. RADIOPHARMACEUTICALS:  21.39 mCi Technetium-55m MDP IV COMPARISON:  MRI abdomen March 21, 2019. FINDINGS: Focus of abnormal radiotracer avidity in the posterior ninth rib. Focus of abnormal radiotracer avidity in the right mandible is commonly odontogenic. Multifocal radiotracer uptake in a pattern most consistent with degenerative arthropathy. Mild right hydronephrosis. IMPRESSION: Focus of abnormal radiotracer activity in the posterior ninth rib, statistically more likely to reflect sequela of prior trauma however metastatic disease is not excluded. Suggest correlation with chest CT. Focus of abnormal radiotracer avidity in the right mandible is commonly odontogenic, suggest correlation with direct visualization. Mild right hydronephrosis consider correlation with CT abdomen and pelvis. Electronically Signed   By: Dahlia Bailiff M.D.   On: 02/21/2023 15:26    Assessment and plan- Patient is a 37 y.o. female diagnosed with breast cancer currently undergoing chemotherapy who presents to Symptom Management for:  Diarrhea- secondary to chemotherapy. Reviewed use of imodium. Prescription for lomotil sent. Reviewed perianal skin care post diarrhea to aid in healing including sitz bath, bidet for cleaning post BM or urination. Avoiding friction with manual stool removal if possible or gentle use. Use topical desitin as barrier cream. Sitz bath for comfort.  Myalgias and arthralgias associated with gcsf - reviewed use of claritin and tylenol. Start oxycodone  2.5-5 mg q6h prn for pain unrelieved. Reviewed narcotic precautions.  Nausea- secondary to chemo. IVF and antiemetics today. Reviewed use of home antiemetics.   Disposition:  RTC as scheduled or sooner if symptoms aren't relieved or recur.    Visit Diagnosis 1. Chemotherapy induced diarrhea   2. 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 Chemotherapy induced nausea and vomiting    Patient expressed understanding  and was in agreement with this plan. She also understands that She can call clinic at any time with any questions, concerns, or complaints.   Thank you for allowing me to participate in the care of this very pleasant patient.   Beckey Rutter, DNP, AGNP-C, [facility] [place]

## 2023-03-15 ENCOUNTER — Other Ambulatory Visit: Payer: Self-pay | Admitting: *Deleted

## 2023-03-15 ENCOUNTER — Other Ambulatory Visit: Payer: Self-pay

## 2023-03-16 ENCOUNTER — Other Ambulatory Visit: Payer: Self-pay | Admitting: Internal Medicine

## 2023-03-16 ENCOUNTER — Encounter: Payer: Self-pay | Admitting: Internal Medicine

## 2023-03-16 ENCOUNTER — Other Ambulatory Visit: Payer: Self-pay | Admitting: *Deleted

## 2023-03-16 ENCOUNTER — Telehealth: Payer: Self-pay | Admitting: *Deleted

## 2023-03-16 DIAGNOSIS — T451X5A Adverse effect of antineoplastic and immunosuppressive drugs, initial encounter: Secondary | ICD-10-CM

## 2023-03-16 MED ORDER — MAGIC MOUTHWASH W/LIDOCAINE: ORAL | 3 refills | Status: DC

## 2023-03-16 MED ORDER — OXYCODONE HCL 5 MG PO TABS: 2.5000 mg | ORAL_TABLET | Freq: Four times a day (QID) | ORAL | 0 refills | Status: DC | PRN

## 2023-03-16 NOTE — Telephone Encounter (Signed)
Per Dr Darrall Dears, Please inform the patient that if she is feeling very weak and thinks cannot wait until tomorrow, should go to ER. She will also benefit from octreotide. I called and spoke with patient and she prefers not to go to the ER unless absolutely necessary and wants appointment for 830 tomorrow as offered. I told her that if she starts passing blood or has dizziness when getting up that she would need to go to ER tonight and she agrees to call answering service to inform us if she does go to ER

## 2023-03-16 NOTE — Telephone Encounter (Signed)
Patient called reporting that she still has diarrhea and that everything she drinks is going straight through her she does not know the number of watery stools she is having a day because she states that the longest she has gone without a stool was a  hour span this morning and there are multiple times even during the night that she is going every 5 minutes. She states that she is using both Lomotil and Imodium and nothing is touching it. She states that she looks and feels sick today. She is pale and feels shaky. She states that she feels her mouth is dry and that he saliva is foamy. Please advise

## 2023-03-16 NOTE — Progress Notes (Signed)
cbc

## 2023-03-17 ENCOUNTER — Other Ambulatory Visit: Payer: Self-pay | Admitting: Lab

## 2023-03-17 ENCOUNTER — Other Ambulatory Visit (HOSPITAL_COMMUNITY): Payer: Self-pay

## 2023-03-17 ENCOUNTER — Inpatient Hospital Stay: Payer: Managed Care, Other (non HMO)

## 2023-03-17 ENCOUNTER — Encounter: Payer: Self-pay | Admitting: Nurse Practitioner

## 2023-03-17 ENCOUNTER — Encounter: Payer: Self-pay | Admitting: Internal Medicine

## 2023-03-17 ENCOUNTER — Other Ambulatory Visit: Payer: Self-pay

## 2023-03-17 ENCOUNTER — Telehealth: Payer: Self-pay

## 2023-03-17 ENCOUNTER — Inpatient Hospital Stay: Payer: Managed Care, Other (non HMO) | Admitting: Nurse Practitioner

## 2023-03-17 VITALS — BP 134/97 | HR 127 | Temp 98.0°F | Resp 17

## 2023-03-17 DIAGNOSIS — Z5189 Encounter for other specified aftercare: Secondary | ICD-10-CM | POA: Diagnosis not present

## 2023-03-17 DIAGNOSIS — E876 Hypokalemia: Secondary | ICD-10-CM

## 2023-03-17 DIAGNOSIS — R197 Diarrhea, unspecified: Secondary | ICD-10-CM

## 2023-03-17 DIAGNOSIS — D72829 Elevated white blood cell count, unspecified: Secondary | ICD-10-CM

## 2023-03-17 DIAGNOSIS — R7989 Other specified abnormal findings of blood chemistry: Secondary | ICD-10-CM

## 2023-03-17 DIAGNOSIS — K521 Toxic gastroenteritis and colitis: Secondary | ICD-10-CM

## 2023-03-17 DIAGNOSIS — C50912 Malignant neoplasm of unspecified site of left female breast: Secondary | ICD-10-CM | POA: Diagnosis not present

## 2023-03-17 LAB — CBC WITH DIFFERENTIAL/PLATELET
Abs Immature Granulocytes: 6.48 10*3/uL — ABNORMAL HIGH (ref 0.00–0.07)
Basophils Absolute: 0 10*3/uL (ref 0.0–0.1)
Basophils Relative: 0 %
Eosinophils Absolute: 0 10*3/uL (ref 0.0–0.5)
Eosinophils Relative: 0 %
HCT: 43.3 % (ref 36.0–46.0)
Hemoglobin: 14.3 g/dL (ref 12.0–15.0)
Immature Granulocytes: 20 %
Lymphocytes Relative: 15 %
Lymphs Abs: 4.9 10*3/uL — ABNORMAL HIGH (ref 0.7–4.0)
MCH: 27.9 pg (ref 26.0–34.0)
MCHC: 33 g/dL (ref 30.0–36.0)
MCV: 84.4 fL (ref 80.0–100.0)
Monocytes Absolute: 2 10*3/uL — ABNORMAL HIGH (ref 0.1–1.0)
Monocytes Relative: 6 %
Neutro Abs: 18.5 10*3/uL — ABNORMAL HIGH (ref 1.7–7.7)
Neutrophils Relative %: 59 %
Platelets: 343 10*3/uL (ref 150–400)
RBC: 5.13 MIL/uL — ABNORMAL HIGH (ref 3.87–5.11)
RDW: 13.2 % (ref 11.5–15.5)
Smear Review: NORMAL
WBC: 31.9 10*3/uL — ABNORMAL HIGH (ref 4.0–10.5)
nRBC: 0.1 % (ref 0.0–0.2)

## 2023-03-17 LAB — C DIFFICILE QUICK SCREEN W PCR REFLEX
C Diff antigen: NEGATIVE
C Diff interpretation: NOT DETECTED
C Diff toxin: NEGATIVE

## 2023-03-17 LAB — CMP (CANCER CENTER ONLY)
ALT: 340 U/L (ref 0–44)
AST: 131 U/L — ABNORMAL HIGH (ref 15–41)
Albumin: 4.2 g/dL (ref 3.5–5.0)
Alkaline Phosphatase: 158 U/L — ABNORMAL HIGH (ref 38–126)
Anion gap: 8 (ref 5–15)
BUN: 17 mg/dL (ref 6–20)
CO2: 16 mmol/L — ABNORMAL LOW (ref 22–32)
Calcium: 9.1 mg/dL (ref 8.9–10.3)
Chloride: 108 mmol/L (ref 98–111)
Creatinine: 0.94 mg/dL (ref 0.44–1.00)
GFR, Estimated: >60 mL/min (ref >60)
Glucose, Bld: 115 mg/dL — ABNORMAL HIGH (ref 70–99)
Potassium: 3.1 mmol/L — ABNORMAL LOW (ref 3.5–5.1)
Sodium: 132 mmol/L — ABNORMAL LOW (ref 135–145)
Total Bilirubin: 0.7 mg/dL (ref 0.3–1.2)
Total Protein: 7.8 g/dL (ref 6.5–8.1)

## 2023-03-17 LAB — GASTROINTESTINAL PANEL BY PCR, STOOL (REPLACES STOOL CULTURE)

## 2023-03-17 LAB — MAGNESIUM: Magnesium: 2.1 mg/dL (ref 1.7–2.4)

## 2023-03-17 MED ORDER — OCTREOTIDE ACETATE 200 MCG/ML IJ SOLN
100.0000 ug | Freq: Three times a day (TID) | INTRAMUSCULAR | 0 refills | Status: DC | PRN
Start: 2023-03-17 — End: 2023-04-10
  Filled 2023-03-17 (×2): qty 15, 10d supply, fill #0

## 2023-03-17 MED ORDER — POTASSIUM CHLORIDE CRYS ER 20 MEQ PO TBCR
20.0000 meq | EXTENDED_RELEASE_TABLET | Freq: Every day | ORAL | 0 refills | Status: DC
  Filled 2023-03-17: qty 30, 30d supply, fill #0

## 2023-03-17 MED ORDER — SODIUM CHLORIDE 0.9% FLUSH
10.0000 mL | Freq: Once | INTRAVENOUS | Status: AC
  Administered 2023-03-17: 10 mL via INTRAVENOUS
  Filled 2023-03-17: qty 10

## 2023-03-17 MED ORDER — HEPARIN SOD (PORK) LOCK FLUSH 100 UNIT/ML IV SOLN
500.0000 [IU] | Freq: Once | INTRAVENOUS | Status: AC
  Administered 2023-03-17: 500 [IU] via INTRAVENOUS
  Filled 2023-03-17: qty 5

## 2023-03-17 MED ORDER — POTASSIUM CHLORIDE 20 MEQ/100ML IV SOLN
20.0000 meq | Freq: Once | INTRAVENOUS | Status: AC
  Administered 2023-03-17: 20 meq via INTRAVENOUS

## 2023-03-17 MED ORDER — SODIUM CHLORIDE 0.9 % IV SOLN
INTRAVENOUS | Status: DC
  Filled 2023-03-17 (×2): qty 250

## 2023-03-17 NOTE — Telephone Encounter (Signed)
Oral Oncology Patient Advocate Encounter  New authorization   Received notification that prior authorization for Octreotide is required.   PA submitted on 03/17/23  Case ID [id number]  Status is pending     Cristina Baker, [place] [facility]  [phone] (phone) [phone] (fax) 03/17/2023 2:04 PM

## 2023-03-17 NOTE — Progress Notes (Signed)
[facility] [facility] [place]. [facility] [address], [place], [place] [address] [phone] (phone) [phone] (fax)  Patient Care Team: Jodi Marble, MD as PCP - General (Internal Medicine) Daiva Huge, RN as Oncology Nurse Navigator   Name of the patient: Cristina Baker  [id number]  1986/01/22   Date of visit: 03/17/23  Diagnosis- Breast Cancer  Chief complaint/ Reason for visit- Diarrhea  Heme/Onc history:  Oncology History  Breast cancer  01/26/2023 Initial Diagnosis   Patient felt palpable lump in left breast   02/10/2023 Mammogram     IMPRESSION: 1. There is a suspicious 24 mm mass at the site of palpable concern which is concerning for malignancy. It demonstrates 3 cm of associated calcifications. Recommend ultrasound-guided biopsy for definitive characterization with attention on post marker placement mammogram to assess for extent of calcifications in relation to the biopsy marking clip. 2. There is an indeterminate LEFT axillary lymph node. Recommend ultrasound-guided biopsy for definitive characterization. 3. No mammographic evidence of malignancy in the RIGHT breast.   02/10/2023 Pathology Results   DIAGNOSIS: A.  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  Size of invasive carcinoma: 1.7 cm in linear length in this sample Histologic grade of invasive carcinoma: Grade 3 Glandular/tubular differentiation score: 3 Nuclear pleomorphism score: 3 Mitotic rate score: 2 Total score: 8 Ductal carcinoma in situ: Present, high-grade Lymphovascular invasion: Indeterminate See comment.  B.  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   Estrogen Receptor (ER) Status: NEGATIVE (LESS THAN 1%)         Internal control cells  positive Progesterone Receptor (PgR) Status: Negative HER2 (by immunohistochemistry): Positive Ki-67: Not performed    02/17/2023 Cancer Staging   Staging form: Breast, AJCC 8th Edition - Clinical: Stage IIB (cT2, cN1, cM0, G3, ER-, PR-, HER2+) - Signed by Jane Canary, MD on 02/17/2023 Histologic grading system: 3 grade system   03/07/2023 -  Chemotherapy   Patient is on Treatment Plan : BREAST  Docetaxel + Carboplatin + Trastuzumab + Pertuzumab  (TCHP) q21d       Genetic Testing   Negative genetic testing. No pathogenic variants identified on the [facility] Multi-Cancer+RNA panel. The report date is 03/01/2023.  The Multi-Cancer + RNA Panel offered by [facility] includes sequencing and/or deletion/duplication analysis of the following 70 genes:  AIP*, ALK, APC*, ATM*, AXIN2*, BAP1*, BARD1*, BLM*, BMPR1A*, BRCA1*, BRCA2*, BRIP1*, CDC73*, CDH1*, CDK4, CDKN1B*, CDKN2A, CHEK2*, CTNNA1*, DICER1*, EPCAM, EGFR, FH*, FLCN*, GREM1, HOXB13, KIT, LZTR1, MAX*, MBD4, MEN1*, MET, MITF, MLH1*, MSH2*, MSH3*, MSH6*, MUTYH*, NF1*, NF2*, NTHL1*, PALB2*, PDGFRA, PMS2*, POLD1*, POLE*, POT1*, PRKAR1A*, PTCH1*, PTEN*, RAD51C*, RAD51D*, RB1*, RET, SDHA*, SDHAF2*, SDHB*, SDHC*, SDHD*, SMAD4*, SMARCA4*, SMARCB1*, SMARCE1*, STK11*, SUFU*, TMEM127*, TP53*, TSC1*, TSC2*, VHL*. RNA analysis is performed for * genes.     Interval history-patient is 37 year old female with above diagnosis of breast cancer currently status post cycle 1 of TCHP chemotherapy on 03/07/2023 with Udenyca support on 03/09/2023, who presents to symptom management clinic for concerns of refractory diarrhea.  She was seen last week for same.  Since that time, nausea and vomiting have resolved.  She continues to have significant watery stools upwards of 50 times in the last 24 hours.  Has maximized Imodium and Lomotil as recommended.  Took oxycodone last night which reduced her output to 2  times.  Continues to have watery diarrhea in clinic.  No pain.  Skin  breakdown has resolved.  No fevers. No abdominal pain.   Review of systems- Review of Systems  Constitutional:  Positive for malaise/fatigue and weight loss. Negative for chills and fever.  HENT:  Negative for hearing loss, nosebleeds, sore throat and tinnitus.   Eyes:  Negative for blurred vision and double vision.  Respiratory:  Negative for cough, hemoptysis, shortness of breath and wheezing.   Cardiovascular:  Negative for chest pain, palpitations and leg swelling.  Gastrointestinal:  Positive for diarrhea. Negative for abdominal pain, blood in stool, constipation, melena, nausea and vomiting.  Genitourinary:  Negative for dysuria and urgency.  Musculoskeletal:  Negative for back pain, falls, joint pain and myalgias.  Skin:  Negative for itching and rash.  Neurological:  Negative for dizziness, tingling, sensory change, loss of consciousness, weakness and headaches.  Endo/Heme/Allergies:  Negative for environmental allergies. Does not bruise/bleed easily.  Psychiatric/Behavioral:  Negative for depression. The patient is not nervous/anxious and does not have insomnia.     No Known Allergies  Past Medical History:  Diagnosis Date   Abnormal Pap smear of cervix    Adult ADHD    BRCA negative 2015   MyRisk neg; IBIS=18/8%   Breast cancer 01/2023   left   Family history of breast cancer 2015   IBIS=18.8%   Family history of ovarian cancer    Herpes genitalis    Lichen sclerosus    PAC (premature atrial contraction)    PONV (postoperative nausea and vomiting)    nausea   PVC's (premature ventricular contractions)    Tachycardia, unspecified     Past Surgical History:  Procedure Laterality Date   BREAST BIOPSY Left 02/10/2023   [place] Core Bx, Coil Clip - path pending   BREAST BIOPSY Left 02/10/2023   [place] Core Hydromark clip path pending   BREAST BIOPSY Left 02/10/2023   [place] LT BREAST BX W LOC DEV 1ST LESION IMG BX SPEC US GUIDE 02/10/2023 [facility]   CESAREAN SECTION   01/03/2007   CESAREAN SECTION WITH BILATERAL TUBAL LIGATION N/A 03/26/2016   Procedure: CESAREAN SECTION WITH BILATERAL TUBAL LIGATION;  Surgeon: Will Bonnet, MD;  Location: [facility] ORS;  Service: Obstetrics;  Laterality: N/A;   COLPOSCOPY     IUD REMOVAL     PORTACATH PLACEMENT N/A 02/24/2023   Procedure: INSERTION PORT-A-CATH;  Surgeon: Benjamine Sprague, DO;  Location: [facility] ORS;  Service: General;  Laterality: N/A;    Social History   Socioeconomic History   Marital status: Married    Spouse name: Denyse Amass   Number of children: 3   Years of education: Not on file   Highest education level: Not on file  Occupational History   Not on file  Tobacco Use   Smoking status: Former    Types: Cigarettes   Smokeless tobacco: Never  Vaping Use   Vaping Use: Never used  Substance and Sexual Activity   Alcohol use: Yes    Comment: rarely   Drug use: No   Sexual activity: Yes    Birth control/protection: Surgical    Comment: tubal ligation  Other Topics Concern   Not on file  Social History Narrative   Not on file   Social Determinants of Health   Financial Resource Strain: Not on file  Food Insecurity: Unknown (02/17/2023)   Hunger Vital Sign    Worried About Running Out of Food in the Last Year: Never true  Ran Out of Food in the Last Year: Not on file  Transportation Needs: No Transportation Needs (02/17/2023)   PRAPARE - Hydrologist (Medical): No    Lack of Transportation (Non-Medical): No  Physical Activity: Unknown (06/02/2018)   Exercise Vital Sign    Days of Exercise per Week: 2 days    Minutes of Exercise per Session: Not on file  Stress: Not on file  Social Connections: Not on file  Intimate Partner Violence: Not At Risk (02/17/2023)   Humiliation, Afraid, Rape, and Kick questionnaire    Fear of Current or Ex-Partner: No    Emotionally Abused: No    Physically Abused: No    Sexually Abused: No    Family History  Problem Relation Age of  Onset   Hypercholesterolemia Mother    Diabetes Mellitus II Father    Hypertension Father    Breast cancer Paternal Aunt 32   Breast cancer Paternal Aunt 92   Breast cancer Paternal Grandmother    Throat cancer Paternal Grandfather    Heart attack Paternal Grandfather      Current Outpatient Medications:    ALPRAZolam (XANAX) 0.5 MG tablet, USE AS DIRECTED TAKE HALF TAB TWICE DAILY FOR ANXIETY, Disp: 15 tablet, Rfl: 2   amphetamine-dextroamphetamine (ADDERALL) 20 MG tablet, Take 1 tablet (20 mg total) by mouth 2 (two) times daily., Disp: 60 tablet, Rfl: 0   clobetasol ointment (TEMOVATE) 0.05 %, Apply to affected area twice daily for 2 weeks, then HS for 2 weeks and then M-W-F HS x 2 weeks, Disp: 30 g, Rfl: 2   dexamethasone (DECADRON) 4 MG tablet, Take 2 tabs by mouth 2 times daily starting day before chemo. Then take 2 tabs daily for 2 days starting day after chemo. Take with food., Disp: 30 tablet, Rfl: 1   diphenoxylate-atropine (LOMOTIL) 2.5-0.025 MG tablet, Take 1-2 tablets by mouth 4 (four) times daily as needed for diarrhea or loose stools., Disp: 30 tablet, Rfl: 0   escitalopram (LEXAPRO) 10 MG tablet, Take 10 mg by mouth every morning., Disp: , Rfl:    ibuprofen (ADVIL) 800 MG tablet, Take 1 tablet (800 mg total) by mouth every 8 (eight) hours as needed for mild pain or moderate pain., Disp: 30 tablet, Rfl: 0   lidocaine-prilocaine (EMLA) cream, Apply to affected area once, Disp: 30 g, Rfl: 3   magic mouthwash w/lidocaine SOLN, Take 35ml swish and spit three times daily., Disp: 480 mL, Rfl: 3   metoprolol succinate (TOPROL-XL) 25 MG 24 hr tablet, TAKE 1 TABLET BY MOUTH ONCE A DAY **MAKE FOLLOW UP APPT WITH DR. KHAN FOR REFILLS**, Disp: 90 tablet, Rfl: 0   octreotide (SANDOSTATIN) 100 MCG/ML SOLN injection, Inject 1 mL (100 mcg total) into the skin every 8 (eight) hours as needed. For diarrhea, Disp: 15 mL, Rfl: 0   ondansetron (ZOFRAN) 8 MG tablet, Take 1 tablet (8 mg total) by mouth  every 8 (eight) hours as needed for nausea or vomiting. Start on the third day after chemotherapy., Disp: 30 tablet, Rfl: 1   ondansetron (ZOFRAN-ODT) 4 MG disintegrating tablet, Take 1 tablet (4 mg total) by mouth every 8 (eight) hours as needed for nausea or vomiting., Disp: 20 tablet, Rfl: 0   oxyCODONE (OXY IR/ROXICODONE) 5 MG immediate release tablet, Take 0.5-1 tablets (2.5-5 mg total) by mouth every 6 (six) hours as needed for severe pain., Disp: 30 tablet, Rfl: 0   prochlorperazine (COMPAZINE) 10 MG tablet, Take 1 tablet (10  mg total) by mouth every 6 (six) hours as needed for nausea or vomiting., Disp: 30 tablet, Rfl: 1   potassium chloride (KLOR-CON M) 20 MEQ tablet, Take 1 tablet (20 mEq total) by mouth daily., Disp: 30 tablet, Rfl: 0 No current facility-administered medications for this visit.  Facility-Administered Medications Ordered in Other Visits:    0.9 %  sodium chloride infusion, , Intravenous, Continuous, Verlon Au, NP, Last Rate: 999 mL/hr at 03/17/23 1033, Infusion Verify at 03/17/23 1033   heparin lock flush 100 unit/mL, 500 Units, Intravenous, Once, Verlon Au, NP  Physical exam:  Vitals:   03/17/23 0904  BP: (!) 134/97  Pulse: (!) 127  Resp: 17  Temp: 98 F (36.7 C)  SpO2: 98%   Physical Exam Vitals reviewed.  Constitutional:      Appearance: She is not ill-appearing.     Comments: Unaccompanied.   Cardiovascular:     Rate and Rhythm: Regular rhythm. Tachycardia present.  Pulmonary:     Effort: No respiratory distress.  Abdominal:     General: There is no distension.     Tenderness: There is no abdominal tenderness. There is no guarding.  Musculoskeletal:     Comments: Ambulating w/o aids  Skin:    General: Skin is warm.     Coloration: Skin is not pale.  Neurological:     Mental Status: She is alert and oriented to person, place, and time.  Psychiatric:        Mood and Affect: Mood normal.        Behavior: Behavior normal.          Latest Ref Rng & Units 03/17/2023    8:53 AM  CMP  Glucose 70 - 99 mg/dL 115   BUN 6 - 20 mg/dL 17   Creatinine 0.44 - 1.00 mg/dL 0.94   Sodium 135 - 145 mmol/L 132   Potassium 3.5 - 5.1 mmol/L 3.1   Chloride 98 - 111 mmol/L 108   CO2 22 - 32 mmol/L 16   Calcium 8.9 - 10.3 mg/dL 9.1   Total Protein 6.5 - 8.1 g/dL 7.8   Total Bilirubin 0.3 - 1.2 mg/dL 0.7   Alkaline Phos 38 - 126 U/L 158   AST 15 - 41 U/L 131   ALT 0 - 44 U/L 340       Latest Ref Rng & Units 03/17/2023    8:53 AM  CBC  WBC 4.0 - 10.5 K/uL 31.9   Hemoglobin 12.0 - 15.0 g/dL 14.3   Hematocrit 36.0 - 46.0 % 43.3   Platelets 150 - 400 K/uL 343    CT CHEST W CONTRAST  Result Date: 03/01/2023 CLINICAL DATA:  History of breast cancer, invasive. Initial workup. * Tracking Code: BO * EXAM: CT CHEST, ABDOMEN, AND PELVIS WITH CONTRAST TECHNIQUE: Multidetector CT imaging of the chest, abdomen and pelvis was performed following the standard protocol during bolus administration of intravenous contrast. RADIATION DOSE REDUCTION: This exam was performed according to the departmental dose-optimization program which includes automated exposure control, adjustment of the mA and/or kV according to patient size and/or use of iterative reconstruction technique. CONTRAST:  174mL OMNIPAQUE IOHEXOL 300 MG/ML  SOLN COMPARISON:  Nuclear medicine bone scan 02/21/2023. And MRI abdomen March 21, 2019 FINDINGS: CT CHEST FINDINGS Cardiovascular: Right chest Port-A-Cath with tip at the superior cavoatrial junction. Normal caliber thoracic aorta. No central pulmonary embolus on this nondedicated study. Normal size heart. No significant pericardial effusion/thickening. Mediastinum/Nodes: Enlarged left axillary lymph  node measures 10 mm in short axis on image 16/2. No pathologically enlarged mediastinal or hilar lymph nodes. Gas fluid level in the esophagus. Lungs/Pleura: No suspicious pulmonary nodules or masses. No pleural effusion. No pneumothorax.  Musculoskeletal: Ill-defined left breast mass with biopsy clip in place measures 2.7 cm on image 18/2. Ill-defined mixed focus in the posterior right ninth rib on image 99/4 corresponding with the increased radiotracer uptake seen on prior nuclear medicine bone scan. CT ABDOMEN PELVIS FINDINGS Hepatobiliary: No suspicious hepatic lesion. Gallbladder is unremarkable. No biliary ductal dilation. Pancreas: No pancreatic ductal dilation or evidence of acute inflammation. Spleen: No splenomegaly. Adrenals/Urinary Tract: Bilateral adrenal glands appear normal. Prominence of the right renal collecting system without discrete hydronephrosis. Kidneys demonstrate symmetric enhancement. Urinary bladder is nondistended limiting evaluation. Stomach/Bowel: No radiopaque enteric contrast material was administered. Stomach is unremarkable for degree of distension. No pathologic dilation of small or large bowel. No evidence of acute bowel inflammation. Vascular/Lymphatic: Normal caliber abdominal aorta. Smooth IVC contours. The portal, splenic and superior mesenteric veins are patent. No pathologically enlarged abdominal or pelvic lymph nodes. Reproductive: Nodular thickening of the fundal endometrium. No suspicious adnexal mass. Other: No significant abdominopelvic free fluid. Musculoskeletal: No aggressive lytic or blastic lesion of bone. IMPRESSION: 1. Ill-defined 2.7 cm left breast mass with biopsy clip in place, consistent with patient's known primary breast neoplasm. 2. Enlarged left axillary lymph node, compatible with local nodal metastatic disease. 3. Ill-defined mixed focus in the posterior right ninth rib corresponding with the increased radiotracer uptake seen on prior nuclear medicine bone scan, nonspecific but suspicious for early osseous metastatic disease. Consider attention on short-term interval follow-up chest CT. 4. No evidence of metastatic disease within the abdomen or pelvis. 5. Nodular thickening of the  fundal endometrium, nonspecific possibly physiologic. Consider further evaluation with dedicated pelvic ultrasound. 6. Prominence of the right renal collecting system without discrete hydronephrosis. 7. Gas fluid level in the esophagus, suggestive of gastroesophageal reflux. Electronically Signed   By: Dahlia Bailiff M.D.   On: 03/01/2023 11:25   CT Abdomen Pelvis W Contrast  Result Date: 03/01/2023 CLINICAL DATA:  History of breast cancer, invasive. Initial workup. * Tracking Code: BO * EXAM: CT CHEST, ABDOMEN, AND PELVIS WITH CONTRAST TECHNIQUE: Multidetector CT imaging of the chest, abdomen and pelvis was performed following the standard protocol during bolus administration of intravenous contrast. RADIATION DOSE REDUCTION: This exam was performed according to the departmental dose-optimization program which includes automated exposure control, adjustment of the mA and/or kV according to patient size and/or use of iterative reconstruction technique. CONTRAST:  153mL OMNIPAQUE IOHEXOL 300 MG/ML  SOLN COMPARISON:  Nuclear medicine bone scan 02/21/2023. And MRI abdomen March 21, 2019 FINDINGS: CT CHEST FINDINGS Cardiovascular: Right chest Port-A-Cath with tip at the superior cavoatrial junction. Normal caliber thoracic aorta. No central pulmonary embolus on this nondedicated study. Normal size heart. No significant pericardial effusion/thickening. Mediastinum/Nodes: Enlarged left axillary lymph node measures 10 mm in short axis on image 16/2. No pathologically enlarged mediastinal or hilar lymph nodes. Gas fluid level in the esophagus. Lungs/Pleura: No suspicious pulmonary nodules or masses. No pleural effusion. No pneumothorax. Musculoskeletal: Ill-defined left breast mass with biopsy clip in place measures 2.7 cm on image 18/2. Ill-defined mixed focus in the posterior right ninth rib on image 99/4 corresponding with the increased radiotracer uptake seen on prior nuclear medicine bone scan. CT ABDOMEN PELVIS  FINDINGS Hepatobiliary: No suspicious hepatic lesion. Gallbladder is unremarkable. No biliary ductal dilation. Pancreas: No pancreatic ductal dilation  or evidence of acute inflammation. Spleen: No splenomegaly. Adrenals/Urinary Tract: Bilateral adrenal glands appear normal. Prominence of the right renal collecting system without discrete hydronephrosis. Kidneys demonstrate symmetric enhancement. Urinary bladder is nondistended limiting evaluation. Stomach/Bowel: No radiopaque enteric contrast material was administered. Stomach is unremarkable for degree of distension. No pathologic dilation of small or large bowel. No evidence of acute bowel inflammation. Vascular/Lymphatic: Normal caliber abdominal aorta. Smooth IVC contours. The portal, splenic and superior mesenteric veins are patent. No pathologically enlarged abdominal or pelvic lymph nodes. Reproductive: Nodular thickening of the fundal endometrium. No suspicious adnexal mass. Other: No significant abdominopelvic free fluid. Musculoskeletal: No aggressive lytic or blastic lesion of bone. IMPRESSION: 1. Ill-defined 2.7 cm left breast mass with biopsy clip in place, consistent with patient's known primary breast neoplasm. 2. Enlarged left axillary lymph node, compatible with local nodal metastatic disease. 3. Ill-defined mixed focus in the posterior right ninth rib corresponding with the increased radiotracer uptake seen on prior nuclear medicine bone scan, nonspecific but suspicious for early osseous metastatic disease. Consider attention on short-term interval follow-up chest CT. 4. No evidence of metastatic disease within the abdomen or pelvis. 5. Nodular thickening of the fundal endometrium, nonspecific possibly physiologic. Consider further evaluation with dedicated pelvic ultrasound. 6. Prominence of the right renal collecting system without discrete hydronephrosis. 7. Gas fluid level in the esophagus, suggestive of gastroesophageal reflux. Electronically  Signed   By: Dahlia Bailiff M.D.   On: 03/01/2023 11:25   DG Chest Port 1 View  Result Date: 02/24/2023 CLINICAL DATA:  Port cath placement EXAM: PORTABLE CHEST 1 VIEW COMPARISON:  06/01/2022 FINDINGS: Right chest port catheter, tip projecting over the inferior portion of the SVC. The heart size and mediastinal contours are within normal limits. Both lungs are clear. The visualized skeletal structures are unremarkable. IMPRESSION: Right chest port catheter, tip projecting over the inferior portion of the SVC. No acute abnormality of the lungs. Electronically Signed   By: Delanna Ahmadi M.D.   On: 02/24/2023 16:08   DG C-Arm 1-60 Min-No Report  Result Date: 02/24/2023 Fluoroscopy was utilized by the requesting physician.  No radiographic interpretation.   NM Bone Scan Whole Body  Result Date: 02/21/2023 CLINICAL DATA:  History of invasive breast cancer, initial workup. EXAM: NUCLEAR MEDICINE WHOLE BODY BONE SCAN TECHNIQUE: Whole body anterior and posterior images were obtained approximately 3 hours after intravenous injection of radiopharmaceutical. RADIOPHARMACEUTICALS:  21.39 mCi Technetium-51m MDP IV COMPARISON:  MRI abdomen March 21, 2019. FINDINGS: Focus of abnormal radiotracer avidity in the posterior ninth rib. Focus of abnormal radiotracer avidity in the right mandible is commonly odontogenic. Multifocal radiotracer uptake in a pattern most consistent with degenerative arthropathy. Mild right hydronephrosis. IMPRESSION: Focus of abnormal radiotracer activity in the posterior ninth rib, statistically more likely to reflect sequela of prior trauma however metastatic disease is not excluded. Suggest correlation with chest CT. Focus of abnormal radiotracer avidity in the right mandible is commonly odontogenic, suggest correlation with direct visualization. Mild right hydronephrosis consider correlation with CT abdomen and pelvis. Electronically Signed   By: Dahlia Bailiff M.D.   On: 02/21/2023 15:26     Assessment and plan- Patient is a 37 y.o. female diagnosed with breast cancer, currently s/p cycle 1 of TCHP chemotherapy with udenyca support who presents to clinic for acute complaints of diarrhea.    Refractory diarrhea-> 50 episodes of watery diarrhea in last 24 hours. previously thought to be related to chemotherapy, Perjeta/pertuzumab most likely agent, however given ongoing and increased  frequency question other etiologies as well.  Will check C. difficile and GI panel today to rule out infectious etiologies. No improvement with maximizing imodium and lomotil concurrently. Slight improvement with oxycodone. Recommend opium tincture vs octreotide. Neither are available in clinic for administration and will check with pharmacies locally regarding availability. Spoke to [facility] outpatient pharmacy and they are able to order short acting octreotide for delivery tomorrow. Opium tincture is not available. Will plan for octreotide 100 mcg Subq every 8 hours for diarrhea. Plan to increase to 200 mcg if unrelieved on day 2 and uptitrate to effect. Discontinue octreotide within 24 hours of resolution of diarrhea to reduce risk of ileus. If stool studies are positive, treat based on results and hold antidiarrheals.  Leukocytosis-WBC elevated consistent with Udenyca administration.  Infection less likely as she is afebrile however, cannot be ruled out.  Monitor. Abnormal LFTs-AST, ALT, alk phos are elevated. AST and ALT > 3 times ULN.  Normal bilirubin. Question liver shock secondary to chemotherapy.   Hypokalemia- K 3.1. Likely secondary to GI losses. Plan for KCl 20 meq today then oral 20 meq daily. Replete IV potassium in setting of diarrhea as indicated.   I reached out to Dr. Doyne Keel regarding patient's presentation.  She agrees with workup for infection and is okay proceeding with opium tincture or octreotide based on pharmacy availability ASAP.  Also recommends proceeding with IV fluids and potassium today  and rechecking patient's LFTs with possible fluids and potassium again tomorrow.  Also plan to reevaluate diarrhea at that time.  Disposition:  Fluids and potassium IV today RTC tomorrow for labs (cmp), myself or Dr Darrall Dears, +/- fluids & potassium - la  Visit Diagnosis 1. Diarrhea, unspecified type   2. Leukocytosis, unspecified type   3. Abnormal LFTs   4. Convalescence following chemotherapy   5. Hypokalemia due to excessive gastrointestinal loss of potassium    Patient expressed understanding and was in agreement with this plan. She also understands that She can call clinic at any time with any questions, concerns, or complaints.   Thank you for allowing me to participate in the care of this very pleasant patient.   Beckey Rutter, DNP, AGNP-C, [place] [facility] [place]

## 2023-03-17 NOTE — Progress Notes (Signed)
Patient received 1 L IVF and IV KCL today. Stool studies sent, pending. Patient aware t come back tomorrow for possible K and Octreotide depending on status of pharmacy. Discharged, stable

## 2023-03-17 NOTE — Telephone Encounter (Signed)
Oral Oncology Patient Advocate Encounter  Prior Authorization for Octreotide has been approved.    PA# [id number]  Effective dates: 03/17/23 through 04/16/23  Patients co-pay is $0.00.    Berdine Addison, [facility] Patient [place]  [phone] (phone) [phone] (fax) 03/17/2023 2:05 PM

## 2023-03-17 NOTE — Progress Notes (Signed)
Patient here for oncology follow-up appointment,  concerns of uncontrolled diarrhea

## 2023-03-17 NOTE — Patient Instructions (Signed)
Dehydration, Adult Dehydration is a condition in which there is not enough water or other fluids in the body. This happens when a person loses more fluids than they take in. Important organs cannot work right without the right amount of fluids. Any loss of fluids from the body can cause dehydration. Dehydration can be mild, worse, or very bad. It should be treated right away to keep it from getting very bad. What are the causes? Conditions that cause loss of water in the body. They include: Watery poop (diarrhea). Vomiting. Sweating a lot. Fever. Infection. Peeing (urinating) a lot. Not drinking enough fluids. Certain medicines, such as medicines that take extra fluid out of the body (diuretics). Lack of safe drinking water. Not being able to get enough water and food. What increases the risk? Having a long-term (chronic) illness that has not been treated the right way, such as: Diabetes. Heart disease. Kidney disease. Being 65 years of age or older. Having a disability. Living in a place that is high above the ground or sea (high in altitude). The thinner, drier air causes more fluid loss. Doing exercises that put stress on your body for a long time. Being active when in hot places. What are the signs or symptoms? Symptoms of dehydration depend on how bad it is. Mild or worse dehydration Thirst. Dry lips or dry mouth. Feeling dizzy or light-headed. Muscle cramps. Passing little pee or dark pee. Pee may be the color of tea. Headache. Very bad dehydration Changes in skin. Skin may: Be cold to the touch (clammy). Be blotchy or pale. Not go back to normal right after you pinch it and let it go. Little or no tears, pee, or sweat. Fast breathing. Low blood pressure. Weak pulse. Pulse that is more than 100 beats a minute when you are sitting still. Other changes, such as: Feeling very thirsty. Eyes that look hollow (sunken). Cold hands and feet. Being confused. Being very  tired (lethargic) or having trouble waking from sleep. Losing weight. Loss of consciousness. How is this treated? Treatment for this condition depends on how bad your dehydration is. Treatment should start right away. Do not wait until your condition gets very bad. Very bad dehydration is an emergency. You will need to go to a hospital. Mild or worse dehydration can be treated at home. You may be asked to: Drink more fluids. Drink an oral rehydration solution (ORS). This drink gives you the right amount of fluids, salts, and minerals (electrolytes). Very bad dehydration can be treated: With fluids through an IV tube. By correcting low levels of electrolytes in the body. By treating the problem that caused your dehydration. Follow these instructions at home: Oral rehydration solution If told by your doctor, drink an ORS: Make an ORS. Use instructions on the package. Start by drinking small amounts, about  cup (120 mL) every 5-10 minutes. Slowly drink more until you have had the amount that your doctor said to have.  Eating and drinking  Drink enough clear fluid to keep your pee pale yellow. If you were told to drink an ORS, finish the ORS first. Then, start slowly drinking other clear fluids. Drink fluids such as: Water. Do not drink only water. Doing that can make the salt (sodium) level in your body get too low. Water from ice chips you suck on. Fruit juice that you have added water to (diluted). Low-calorie sports drinks. Eat foods that have the right amounts of salts and minerals, such as bananas, oranges, potatoes,   tomatoes, or spinach. Do not drink alcohol. Avoid drinks that have caffeine or sugar. These include:: High-calorie sports drinks. Fruit juice that you did not add water to. Soda. Coffee or energy drinks. Avoid foods that are greasy or have a lot of fat or sugar. General instructions Take over-the-counter and prescription medicines only as told by your doctor. Do  not take sodium tablets. Doing that can make the salt level in your body get too high. Return to your normal activities as told by your doctor. Ask your doctor what activities are safe for you. Keep all follow-up visits. Your doctor may check and change your treatment. Contact a doctor if: You have pain in your belly (abdomen) and the pain: Gets worse. Stays in one place. You have a rash. You have a stiff neck. You get angry or annoyed more easily than normal. You are more tired or have a harder time waking than normal. You feel weak or dizzy. You feel very thirsty. Get help right away if: You have any symptoms of very bad dehydration. You vomit every time you eat or drink. Your vomiting gets worse, does not go away, or you vomit blood or green stuff. You are getting treatment, but symptoms are getting worse. You have a fever. You have a very bad headache. You have: Diarrhea that gets worse or does not go away. Blood in your poop (stool). This may cause poop to look black and tarry. No pee in 6-8 hours. Only a small amount of pee in 6-8 hours, and the pee is very dark. You have trouble breathing. These symptoms may be an emergency. Get help right away. Call 911. Do not wait to see if the symptoms will go away. Do not drive yourself to the hospital. This information is not intended to replace advice given to you by your health care provider. Make sure you discuss any questions you have with your health care provider. Document Revised: 06/28/2022 Document Reviewed: 06/28/2022 Elsevier Patient Education  2023 [employer]  

## 2023-03-17 NOTE — Telephone Encounter (Signed)
Patient is aware co-pay is $0 and that [facility] will need to order for tomorrow 03/18/23.    Berdine Addison, [facility] Patient [place]  [phone] (phone) [phone] (fax) 03/17/2023 3:02 PM

## 2023-03-18 ENCOUNTER — Other Ambulatory Visit (HOSPITAL_COMMUNITY): Payer: Self-pay

## 2023-03-18 ENCOUNTER — Inpatient Hospital Stay: Payer: Managed Care, Other (non HMO)

## 2023-03-18 ENCOUNTER — Other Ambulatory Visit: Payer: Self-pay

## 2023-03-18 ENCOUNTER — Encounter: Payer: Self-pay | Admitting: Nurse Practitioner

## 2023-03-18 ENCOUNTER — Inpatient Hospital Stay (HOSPITAL_BASED_OUTPATIENT_CLINIC_OR_DEPARTMENT_OTHER): Payer: Managed Care, Other (non HMO) | Admitting: Nurse Practitioner

## 2023-03-18 VITALS — BP 143/96 | HR 90 | Temp 97.5°F | Wt 159.0 lb

## 2023-03-18 DIAGNOSIS — R197 Diarrhea, unspecified: Secondary | ICD-10-CM

## 2023-03-18 DIAGNOSIS — T451X5A Adverse effect of antineoplastic and immunosuppressive drugs, initial encounter: Secondary | ICD-10-CM

## 2023-03-18 DIAGNOSIS — K521 Toxic gastroenteritis and colitis: Secondary | ICD-10-CM

## 2023-03-18 DIAGNOSIS — E876 Hypokalemia: Secondary | ICD-10-CM | POA: Diagnosis not present

## 2023-03-18 DIAGNOSIS — R7989 Other specified abnormal findings of blood chemistry: Secondary | ICD-10-CM

## 2023-03-18 DIAGNOSIS — C50912 Malignant neoplasm of unspecified site of left female breast: Secondary | ICD-10-CM | POA: Diagnosis not present

## 2023-03-18 LAB — CMP (CANCER CENTER ONLY)
ALT: 331 U/L (ref 0–44)
AST: 67 U/L — ABNORMAL HIGH (ref 15–41)
Albumin: 3.7 g/dL (ref 3.5–5.0)
Alkaline Phosphatase: 140 U/L — ABNORMAL HIGH (ref 38–126)
Anion gap: 5 (ref 5–15)
BUN: 15 mg/dL (ref 6–20)
CO2: 19 mmol/L — ABNORMAL LOW (ref 22–32)
Calcium: 8.8 mg/dL — ABNORMAL LOW (ref 8.9–10.3)
Chloride: 110 mmol/L (ref 98–111)
Creatinine: 0.73 mg/dL (ref 0.44–1.00)
GFR, Estimated: >60 mL/min (ref >60)
Glucose, Bld: 118 mg/dL — ABNORMAL HIGH (ref 70–99)
Potassium: 2.9 mmol/L — ABNORMAL LOW (ref 3.5–5.1)
Sodium: 134 mmol/L — ABNORMAL LOW (ref 135–145)
Total Bilirubin: 0.2 mg/dL — ABNORMAL LOW (ref 0.3–1.2)
Total Protein: 6.8 g/dL (ref 6.5–8.1)

## 2023-03-18 MED ORDER — SODIUM CHLORIDE 0.9 % IV SOLN
INTRAVENOUS | Status: DC
  Filled 2023-03-18 (×2): qty 250

## 2023-03-18 MED ORDER — HEPARIN SOD (PORK) LOCK FLUSH 100 UNIT/ML IV SOLN
500.0000 [IU] | Freq: Once | INTRAVENOUS | Status: AC
  Administered 2023-03-18: 500 [IU] via INTRAVENOUS
  Filled 2023-03-18: qty 5

## 2023-03-18 MED ORDER — DIPHENOXYLATE-ATROPINE 2.5-0.025 MG PO TABS
1.0000 | ORAL_TABLET | Freq: Four times a day (QID) | ORAL | 0 refills | Status: DC | PRN
Start: 2023-03-18 — End: 2023-06-21

## 2023-03-18 MED ORDER — SODIUM CHLORIDE 0.9% FLUSH
10.0000 mL | Freq: Once | INTRAVENOUS | Status: AC
  Administered 2023-03-18: 10 mL via INTRAVENOUS
  Filled 2023-03-18: qty 10

## 2023-03-18 MED ORDER — "BD LUER-LOK SYRINGE 25G X 5/8"" 3 ML MISC"
0 refills | Status: DC
  Filled 2023-03-18: qty 30, 10d supply, fill #0

## 2023-03-18 MED ORDER — POTASSIUM CHLORIDE 20 MEQ/100ML IV SOLN
20.0000 meq | Freq: Once | INTRAVENOUS | Status: AC
  Administered 2023-03-18: 20 meq via INTRAVENOUS

## 2023-03-18 NOTE — Patient Instructions (Signed)
Octreotide Injection Solution What is this medication? OCTREOTIDE (ok TREE oh tide) treats high levels of growth hormone (acromegaly). It works by reducing the amount of growth hormone your body makes. This reduces symptoms and the risk of health problems caused by too much growth hormone, such as diabetes and heart disease. It may also be used to treat diarrhea caused by neuroendocrine tumors. It works by slowing down the release of serotonin from the tumor cells. This reduces the number of bowel movements you have. This medicine may be used for other purposes; ask your health care provider or pharmacist if you have questions. COMMON BRAND NAME(S): Bynfezia, Sandostatin What should I tell my care team before I take this medication? They need to know if you have any of these conditions: Diabetes Gallbladder disease Kidney disease Liver disease Thyroid disease An unusual or allergic reaction to octreotide, other medications, foods, dyes, or preservatives Pregnant or trying to get pregnant Breast-feeding How should I use this medication? This medication is injected under the skin or into a vein. It is usually given by your care team in a hospital or clinic setting. If you get this medication at home, you will be taught how to prepare and give it. Use exactly as directed. Take it as directed on the prescription label at the same time every day. Keep taking it unless your care team tells you to stop. Allow the injection solution to come to room temperature before use. Do not warm it artificially. It is important that you put your used needles and syringes in a special sharps container. Do not put them in a trash can. If you do not have a sharps container, call your pharmacist or care team to get one. Talk to your care team about the use of this medication in children. Special care may be needed. Overdosage: If you think you have taken too much of this medicine contact a poison control center or  emergency room at once. NOTE: This medicine is only for you. Do not share this medicine with others. What if I miss a dose? If you miss a dose, take it as soon as you can. If it is almost time for your next dose, take only that dose. Do not take double or extra doses. What may interact with this medication? Bromocriptine Certain medications for blood pressure, heart disease, irregular heartbeat Cyclosporine Diuretics Medications for diabetes, including insulin Quinidine This list may not describe all possible interactions. Give your health care provider a list of all the medicines, herbs, non-prescription drugs, or dietary supplements you use. Also tell them if you smoke, drink alcohol, or use illegal drugs. Some items may interact with your medicine. What should I watch for while using this medication? Visit your care team for regular checks on your progress. Tell your care team if your symptoms do not start to get better or if they get worse. To help reduce irritation at the injection site, use a different site for each injection and make sure the solution is at room temperature before use. This medication may cause decreases in blood sugar. Signs of low blood sugar include chills, cool, pale skin or cold sweats, drowsiness, extreme hunger, fast heartbeat, headache, nausea, nervousness or anxiety, shakiness, trembling, unsteadiness, tiredness, or weakness. Contact your care team right away if you experience any of these symptoms. This medication may increase blood sugar. The risk may be higher in patients who already have diabetes. Ask your care team what you can do to lower your   risk of diabetes while taking this medication. You should make sure you get enough vitamin B12 while you are taking this medication. Discuss the foods you eat and the vitamins you take with your care team. What side effects may I notice from receiving this medication? Side effects that you should report to your care  team as soon as possible: Allergic reactions--skin rash, itching, hives, swelling of the face, lips, tongue, or throat Gallbladder problems--severe stomach pain, nausea, vomiting, fever Heart rhythm changes--fast or irregular heartbeat, dizziness, feeling faint or lightheaded, chest pain, trouble breathing High blood sugar (hyperglycemia)--increased thirst or amount of urine, unusual weakness or fatigue, blurry vision Low blood sugar (hypoglycemia)--tremors or shaking, anxiety, sweating, cold or clammy skin, confusion, dizziness, rapid heartbeat Low thyroid levels (hypothyroidism)--unusual weakness or fatigue, increased sensitivity to cold, constipation, hair loss, dry skin, weight gain, feelings of depression Low vitamin B12 level--pain, tingling, or numbness in the hands or feet, muscle weakness, dizziness, confusion, trouble concentrating Pancreatitis--severe stomach pain that spreads to your back or gets worse after eating or when touched, fever, nausea, vomiting Side effects that usually do not require medical attention (report to your care team if they continue or are bothersome): Diarrhea Dizziness Gas Headache Pain, redness, or irritation at injection site Stomach pain This list may not describe all possible side effects. Call your doctor for medical advice about side effects. You may report side effects to FDA at 1-800-FDA-1088. Where should I keep my medication? Keep out of the reach of children and pets. Store in the refrigerator. Protect from light. Allow to come to room temperature naturally. Do not use artificial heat. If protected from light, the injection may be stored between 20 and 30 degrees C (70 and 86 degrees F) for 14 days. After the initial use, throw away any unused portion of a multiple dose vial after 14 days. Get rid of any unused portions of the ampules after use. To get rid of medications that are no longer needed or have expired: Take the medication to a medication  take-back program. Ask your pharmacy or law enforcement to find a location. If you cannot return the medication, ask your pharmacist or care team how to get rid of the medication safely. NOTE: This sheet is a summary. It may not cover all possible information. If you have questions about this medicine, talk to your doctor, pharmacist, or health care provider.  2023 Elsevier/Gold Standard (2022-03-03 00:00:00)  

## 2023-03-18 NOTE — Progress Notes (Signed)
Educated pt on how to administer Octreotide subq. Return demonstration performed by patient. Pt feels comfortable administering

## 2023-03-18 NOTE — Progress Notes (Signed)
[facility]pt[facility] Management [facility]  [facility] at [facility] A Department of the [place]. [facility] [address] [place], [place] [address] [phone] (phone) [phone] (fax)  Patient Care Team: Sherron Monday, MD as PCP - General (Internal Medicine) Hulen Luster, RN as Oncology Nurse Navigator   Name of the patient: Cristina Baker  [id number]  08-15-1986   Date of visit: 03/18/23  Diagnosis- Breast Cancer  Chief complaint/ Reason for visit- Diarrhea  Heme/Onc history:  Oncology History  Breast cancer  01/26/2023 Initial Diagnosis   Patient felt palpable lump in left breast   02/10/2023 Mammogram     IMPRESSION: 1. There is a suspicious 24 mm mass at the site of palpable concern which is concerning for malignancy. It demonstrates 3 cm of associated calcifications. Recommend ultrasound-guided biopsy for definitive characterization with attention on post marker placement mammogram to assess for extent of calcifications in relation to the biopsy marking clip. 2. There is an indeterminate LEFT axillary lymph node. Recommend ultrasound-guided biopsy for definitive characterization. 3. No mammographic evidence of malignancy in the RIGHT breast.   02/10/2023 Pathology Results   DIAGNOSIS: A.  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  Size of invasive carcinoma: 1.7 cm in linear length in this sample Histologic grade of invasive carcinoma: Grade 3 Glandular/tubular differentiation score: 3 Nuclear pleomorphism score: 3 Mitotic rate score: 2 Total score: 8 Ductal carcinoma in situ: Present, high-grade Lymphovascular invasion: Indeterminate See comment.  B.  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   Estrogen Receptor (ER) Status: NEGATIVE (LESS THAN 1%)         Internal control cells  positive Progesterone Receptor (PgR) Status: Negative HER2 (by immunohistochemistry): Positive Ki-67: Not performed    02/17/2023 Cancer Staging   Staging form: Breast, AJCC 8th Edition - Clinical: Stage IIB (cT2, cN1, cM0, G3, ER-, PR-, HER2+) - Signed by Michaelyn Barter, MD on 02/17/2023 Histologic grading system: 3 grade system   03/07/2023 -  Chemotherapy   Patient is on Treatment Plan : BREAST  Docetaxel + Carboplatin + Trastuzumab + Pertuzumab  (TCHP) q21d       Genetic Testing   Negative genetic testing. No pathogenic variants identified on the [facility] Multi-Cancer+RNA panel. The report date is 03/01/2023.  The Multi-Cancer + RNA Panel offered by [facility] includes sequencing and/or deletion/duplication analysis of the following 70 genes:  AIP*, ALK, APC*, ATM*, AXIN2*, BAP1*, BARD1*, BLM*, BMPR1A*, BRCA1*, BRCA2*, BRIP1*, CDC73*, CDH1*, CDK4, CDKN1B*, CDKN2A, CHEK2*, CTNNA1*, DICER1*, EPCAM, EGFR, FH*, FLCN*, GREM1, HOXB13, KIT, LZTR1, MAX*, MBD4, MEN1*, MET, MITF, MLH1*, MSH2*, MSH3*, MSH6*, MUTYH*, NF1*, NF2*, NTHL1*, PALB2*, PDGFRA, PMS2*, POLD1*, POLE*, POT1*, PRKAR1A*, PTCH1*, PTEN*, RAD51C*, RAD51D*, RB1*, RET, SDHA*, SDHAF2*, SDHB*, SDHC*, SDHD*, SMAD4*, SMARCA4*, SMARCB1*, SMARCE1*, STK11*, SUFU*, TMEM127*, TP53*, TSC1*, TSC2*, VHL*. RNA analysis is performed for * genes.     Interval history-patient is 37 year old female with above diagnosis of breast cancer currently status post cycle 1 of TCHP chemotherapy on 03/07/2023 with Udenyca support on 03/09/2023, who returns to clinic for follow up of refractory diarrhea. She was seen in clinic yesterday, received IV fluids, potassium. Prescription was sent for Sandostatin for home administration. Medication had to be ordered and she'll be able to pick up this afternoon. Diarrhea overnight has improved. Approximately 15+ since visit yesterday. Energy has improved.    Review of systems- Review of Systems  Constitutional:  Positive for  malaise/fatigue and weight loss. Negative for chills and fever.  HENT:  Negative for hearing loss, nosebleeds, sore throat and tinnitus.   Eyes:  Negative for blurred vision and double vision.  Respiratory:  Negative for cough, hemoptysis, shortness of breath and wheezing.   Cardiovascular:  Negative for chest pain, palpitations and leg swelling.  Gastrointestinal:  Positive for diarrhea. Negative for abdominal pain, blood in stool, constipation, melena, nausea and vomiting.  Genitourinary:  Negative for dysuria and urgency.  Musculoskeletal:  Negative for back pain, falls, joint pain and myalgias.  Skin:  Negative for itching and rash.  Neurological:  Negative for dizziness, tingling, sensory change, loss of consciousness, weakness and headaches.  Endo/Heme/Allergies:  Negative for environmental allergies. Does not bruise/bleed easily.  Psychiatric/Behavioral:  Negative for depression. The patient is not nervous/anxious and does not have insomnia.     No Known Allergies  Past Medical History:  Diagnosis Date   Abnormal Pap smear of cervix    Adult ADHD    BRCA negative 2015   MyRisk neg; IBIS=18/8%   Breast cancer 01/2023   left   Family history of breast cancer 2015   IBIS=18.8%   Family history of ovarian cancer    Herpes genitalis    Lichen sclerosus    PAC (premature atrial contraction)    PONV (postoperative nausea and vomiting)    nausea   PVC's (premature ventricular contractions)    Tachycardia, unspecified     Past Surgical History:  Procedure Laterality Date   BREAST BIOPSY Left 02/10/2023   [place] Core Bx, Coil Clip - path pending   BREAST BIOPSY Left 02/10/2023   [place] Core Hydromark clip path pending   BREAST BIOPSY Left 02/10/2023   [place] LT BREAST BX W LOC DEV 1ST LESION IMG BX SPEC US GUIDE 02/10/2023 [facility]   CESAREAN SECTION  01/03/2007   CESAREAN SECTION WITH BILATERAL TUBAL LIGATION N/A 03/26/2016   Procedure: CESAREAN SECTION WITH BILATERAL TUBAL  LIGATION;  Surgeon: Conard Novak, MD;  Location: [facility] ORS;  Service: Obstetrics;  Laterality: N/A;   COLPOSCOPY     IUD REMOVAL     PORTACATH PLACEMENT N/A 02/24/2023   Procedure: INSERTION PORT-A-CATH;  Surgeon: Sung Amabile, DO;  Location: [facility] ORS;  Service: General;  Laterality: N/A;    Social History   Socioeconomic History   Marital status: Married    Spouse name: Verdon Cummins   Number of children: 3   Years of education: Not on file   Highest education level: Not on file  Occupational History   Not on file  Tobacco Use   Smoking status: Former    Types: Cigarettes   Smokeless tobacco: Never  Vaping Use   Vaping Use: Never used  Substance and Sexual Activity   Alcohol use: Yes    Comment: rarely   Drug use: No   Sexual activity: Yes    Birth control/protection: Surgical    Comment: tubal ligation  Other Topics Concern   Not on file  Social History Narrative   Not on file   Social Determinants of Health   Financial Resource Strain: Not on file  Food Insecurity: Unknown (02/17/2023)   Hunger Vital Sign    Worried About Running Out of Food in the Last Year: Never true    Ran Out of Food in the Last Year: Not on file  Transportation Needs: No Transportation Needs (02/17/2023)   PRAPARE - Administrator, Civil Service (Medical):  No    Lack of Transportation (Non-Medical): No  Physical Activity: Unknown (06/02/2018)   Exercise Vital Sign    Days of Exercise per Week: 2 days    Minutes of Exercise per Session: Not on file  Stress: Not on file  Social Connections: Not on file  Intimate Partner Violence: Not At Risk (02/17/2023)   Humiliation, Afraid, Rape, and Kick questionnaire    Fear of Current or Ex-Partner: No    Emotionally Abused: No    Physically Abused: No    Sexually Abused: No    Family History  Problem Relation Age of Onset   Hypercholesterolemia Mother    Diabetes Mellitus II Father    Hypertension Father    Breast cancer Paternal Aunt [id number]    Breast cancer Paternal Aunt [id number]   Breast cancer Paternal Grandmother    Throat cancer Paternal Grandfather    Heart attack Paternal Grandfather      Current Outpatient Medications:    ALPRAZolam (XANAX) 0.5 MG tablet, USE AS DIRECTED TAKE HALF TAB TWICE DAILY FOR ANXIETY, Disp: 15 tablet, Rfl: 2   clobetasol ointment (TEMOVATE) 0.05 %, Apply to affected area twice daily for 2 weeks, then HS for 2 weeks and then M-W-F HS x 2 weeks, Disp: 30 g, Rfl: 2   dexamethasone (DECADRON) 4 MG tablet, Take 2 tabs by mouth 2 times daily starting day before chemo. Then take 2 tabs daily for 2 days starting day after chemo. Take with food., Disp: 30 tablet, Rfl: 1   diphenoxylate-atropine (LOMOTIL) 2.5-0.025 MG tablet, Take 1-2 tablets by mouth 4 (four) times daily as needed for diarrhea or loose stools., Disp: 30 tablet, Rfl: 0   escitalopram (LEXAPRO) 10 MG tablet, Take 10 mg by mouth every morning., Disp: , Rfl:    ibuprofen (ADVIL) 800 MG tablet, Take 1 tablet (800 mg total) by mouth every 8 (eight) hours as needed for mild pain or moderate pain., Disp: 30 tablet, Rfl: 0   lidocaine-prilocaine (EMLA) cream, Apply to affected area once, Disp: 30 g, Rfl: 3   magic mouthwash w/lidocaine SOLN, Take 5ml swish and spit three times daily., Disp: 480 mL, Rfl: 3   metoprolol succinate (TOPROL-XL) 25 MG 24 hr tablet, TAKE 1 TABLET BY MOUTH ONCE A DAY **MAKE FOLLOW UP APPT WITH DR. KHAN FOR REFILLS**, Disp: 90 tablet, Rfl: 0   Octreotide Acetate 200 MCG/ML SOLN, Inject 0.5 mLs (100 mcg total) into the skin every 8 (eight) hours as needed., Disp: 15 mL, Rfl: 0   ondansetron (ZOFRAN) 8 MG tablet, Take 1 tablet (8 mg total) by mouth every 8 (eight) hours as needed for nausea or vomiting. Start on the third day after chemotherapy., Disp: 30 tablet, Rfl: 1   ondansetron (ZOFRAN-ODT) 4 MG disintegrating tablet, Take 1 tablet (4 mg total) by mouth every 8 (eight) hours as needed for nausea or vomiting., Disp: 20 tablet, Rfl:  0   oxyCODONE (OXY IR/ROXICODONE) 5 MG immediate release tablet, Take 0.5-1 tablets (2.5-5 mg total) by mouth every 6 (six) hours as needed for severe pain., Disp: 30 tablet, Rfl: 0   potassium chloride SA (KLOR-CON M) 20 MEQ tablet, Take 1 tablet (20 mEq total) by mouth daily., Disp: 30 tablet, Rfl: 0   prochlorperazine (COMPAZINE) 10 MG tablet, Take 1 tablet (10 mg total) by mouth every 6 (six) hours as needed for nausea or vomiting., Disp: 30 tablet, Rfl: 1   amphetamine-dextroamphetamine (ADDERALL) 20 MG tablet, Take 1 tablet (20 mg total) by  mouth 2 (two) times daily., Disp: 60 tablet, Rfl: 0  Physical exam:  Vitals:   03/18/23 1331  BP: (!) 143/96  Pulse: 90  Temp: (!) 97.5 F (36.4 C)  TempSrc: Tympanic  SpO2: 99%  Weight: 159 lb (72.1 kg)    Physical Exam Vitals reviewed.  Constitutional:      Appearance: She is not ill-appearing.     Comments: Accompanied by sister  Eyes:     General: No scleral icterus. Cardiovascular:     Rate and Rhythm: Normal rate and regular rhythm.  Pulmonary:     Effort: No respiratory distress.  Abdominal:     General: There is no distension.     Tenderness: There is no abdominal tenderness. There is no guarding.  Musculoskeletal:     Comments: Ambulating w/o aids  Skin:    General: Skin is warm.     Coloration: Skin is not pale.  Neurological:     Mental Status: She is alert and oriented to person, place, and time.  Psychiatric:        Mood and Affect: Mood normal.        Behavior: Behavior normal.         Latest Ref Rng & Units 03/18/2023    1:15 PM  CMP  Glucose 70 - 99 mg/dL 841   BUN 6 - 20 mg/dL 15   Creatinine 3.24 - 1.00 mg/dL 4.01   Sodium 027 - 253 mmol/L 134   Potassium 3.5 - 5.1 mmol/L 2.9   Chloride 98 - 111 mmol/L 110   CO2 22 - 32 mmol/L 19   Calcium 8.9 - 10.3 mg/dL 8.8   Total Protein 6.5 - 8.1 g/dL 6.8   Total Bilirubin 0.3 - 1.2 mg/dL 0.2   Alkaline Phos 38 - 126 U/L 140   AST 15 - 41 U/L 67   ALT 0 - 44  U/L 331     CT CHEST W CONTRAST  Result Date: 03/01/2023 CLINICAL DATA:  History of breast cancer, invasive. Initial workup. * Tracking Code: BO * EXAM: CT CHEST, ABDOMEN, AND PELVIS WITH CONTRAST TECHNIQUE: Multidetector CT imaging of the chest, abdomen and pelvis was performed following the standard protocol during bolus administration of intravenous contrast. RADIATION DOSE REDUCTION: This exam was performed according to the departmental dose-optimization program which includes automated exposure control, adjustment of the mA and/or kV according to patient size and/or use of iterative reconstruction technique. CONTRAST:  OMNIPAQUE IOHEXOL 300 MG/ML  SOLN COMPARISON:  Nuclear medicine bone scan 02/21/2023. And MRI abdomen March 21, 2019 FINDINGS: CT CHEST FINDINGS Cardiovascular: Right chest Port-A-Cath with tip at the superior cavoatrial junction. Normal caliber thoracic aorta. No central pulmonary embolus on this nondedicated study. Normal size heart. No significant pericardial effusion/thickening. Mediastinum/Nodes: Enlarged left axillary lymph node measures 10 mm in short axis on image 16/2. No pathologically enlarged mediastinal or hilar lymph nodes. Gas fluid level in the esophagus. Lungs/Pleura: No suspicious pulmonary nodules or masses. No pleural effusion. No pneumothorax. Musculoskeletal: Ill-defined left breast mass with biopsy clip in place measures 2.7 cm on image 18/2. Ill-defined mixed focus in the posterior right ninth rib on image 99/4 corresponding with the increased radiotracer uptake seen on prior nuclear medicine bone scan. CT ABDOMEN PELVIS FINDINGS Hepatobiliary: No suspicious hepatic lesion. Gallbladder is unremarkable. No biliary ductal dilation. Pancreas: No pancreatic ductal dilation or evidence of acute inflammation. Spleen: No splenomegaly. Adrenals/Urinary Tract: Bilateral adrenal glands appear normal. Prominence of the right renal collecting system  without discrete  hydronephrosis. Kidneys demonstrate symmetric enhancement. Urinary bladder is nondistended limiting evaluation. Stomach/Bowel: No radiopaque enteric contrast material was administered. Stomach is unremarkable for degree of distension. No pathologic dilation of small or large bowel. No evidence of acute bowel inflammation. Vascular/Lymphatic: Normal caliber abdominal aorta. Smooth IVC contours. The portal, splenic and superior mesenteric veins are patent. No pathologically enlarged abdominal or pelvic lymph nodes. Reproductive: Nodular thickening of the fundal endometrium. No suspicious adnexal mass. Other: No significant abdominopelvic free fluid. Musculoskeletal: No aggressive lytic or blastic lesion of bone. IMPRESSION: 1. Ill-defined 2.7 cm left breast mass with biopsy clip in place, consistent with patient's known primary breast neoplasm. 2. Enlarged left axillary lymph node, compatible with local nodal metastatic disease. 3. Ill-defined mixed focus in the posterior right ninth rib corresponding with the increased radiotracer uptake seen on prior nuclear medicine bone scan, nonspecific but suspicious for early osseous metastatic disease. Consider attention on short-term interval follow-up chest CT. 4. No evidence of metastatic disease within the abdomen or pelvis. 5. Nodular thickening of the fundal endometrium, nonspecific possibly physiologic. Consider further evaluation with dedicated pelvic ultrasound. 6. Prominence of the right renal collecting system without discrete hydronephrosis. 7. Gas fluid level in the esophagus, suggestive of gastroesophageal reflux. Electronically Signed   By: Maudry Mayhew M.D.   On: 03/01/2023 11:25   CT Abdomen Pelvis W Contrast  Result Date: 03/01/2023 CLINICAL DATA:  History of breast cancer, invasive. Initial workup. * Tracking Code: BO * EXAM: CT CHEST, ABDOMEN, AND PELVIS WITH CONTRAST TECHNIQUE: Multidetector CT imaging of the chest, abdomen and pelvis was performed  following the standard protocol during bolus administration of intravenous contrast. RADIATION DOSE REDUCTION: This exam was performed according to the departmental dose-optimization program which includes automated exposure control, adjustment of the mA and/or kV according to patient size and/or use of iterative reconstruction technique. CONTRAST:  OMNIPAQUE IOHEXOL 300 MG/ML  SOLN COMPARISON:  Nuclear medicine bone scan 02/21/2023. And MRI abdomen March 21, 2019 FINDINGS: CT CHEST FINDINGS Cardiovascular: Right chest Port-A-Cath with tip at the superior cavoatrial junction. Normal caliber thoracic aorta. No central pulmonary embolus on this nondedicated study. Normal size heart. No significant pericardial effusion/thickening. Mediastinum/Nodes: Enlarged left axillary lymph node measures 10 mm in short axis on image 16/2. No pathologically enlarged mediastinal or hilar lymph nodes. Gas fluid level in the esophagus. Lungs/Pleura: No suspicious pulmonary nodules or masses. No pleural effusion. No pneumothorax. Musculoskeletal: Ill-defined left breast mass with biopsy clip in place measures 2.7 cm on image 18/2. Ill-defined mixed focus in the posterior right ninth rib on image 99/4 corresponding with the increased radiotracer uptake seen on prior nuclear medicine bone scan. CT ABDOMEN PELVIS FINDINGS Hepatobiliary: No suspicious hepatic lesion. Gallbladder is unremarkable. No biliary ductal dilation. Pancreas: No pancreatic ductal dilation or evidence of acute inflammation. Spleen: No splenomegaly. Adrenals/Urinary Tract: Bilateral adrenal glands appear normal. Prominence of the right renal collecting system without discrete hydronephrosis. Kidneys demonstrate symmetric enhancement. Urinary bladder is nondistended limiting evaluation. Stomach/Bowel: No radiopaque enteric contrast material was administered. Stomach is unremarkable for degree of distension. No pathologic dilation of small or large bowel. No  evidence of acute bowel inflammation. Vascular/Lymphatic: Normal caliber abdominal aorta. Smooth IVC contours. The portal, splenic and superior mesenteric veins are patent. No pathologically enlarged abdominal or pelvic lymph nodes. Reproductive: Nodular thickening of the fundal endometrium. No suspicious adnexal mass. Other: No significant abdominopelvic free fluid. Musculoskeletal: No aggressive lytic or blastic lesion of bone. IMPRESSION: 1. Ill-defined 2.7 cm  left breast mass with biopsy clip in place, consistent with patient's known primary breast neoplasm. 2. Enlarged left axillary lymph node, compatible with local nodal metastatic disease. 3. Ill-defined mixed focus in the posterior right ninth rib corresponding with the increased radiotracer uptake seen on prior nuclear medicine bone scan, nonspecific but suspicious for early osseous metastatic disease. Consider attention on short-term interval follow-up chest CT. 4. No evidence of metastatic disease within the abdomen or pelvis. 5. Nodular thickening of the fundal endometrium, nonspecific possibly physiologic. Consider further evaluation with dedicated pelvic ultrasound. 6. Prominence of the right renal collecting system without discrete hydronephrosis. 7. Gas fluid level in the esophagus, suggestive of gastroesophageal reflux. Electronically Signed   By: Maudry Mayhew M.D.   On: 03/01/2023 11:25   DG Chest Port 1 View  Result Date: 02/24/2023 CLINICAL DATA:  Port cath placement EXAM: PORTABLE CHEST 1 VIEW COMPARISON:  06/01/2022 FINDINGS: Right chest port catheter, tip projecting over the inferior portion of the SVC. The heart size and mediastinal contours are within normal limits. Both lungs are clear. The visualized skeletal structures are unremarkable. IMPRESSION: Right chest port catheter, tip projecting over the inferior portion of the SVC. No acute abnormality of the lungs. Electronically Signed   By: Jearld Lesch M.D.   On: 02/24/2023 16:08    DG C-Arm 1-60 Min-No Report  Result Date: 02/24/2023 Fluoroscopy was utilized by the requesting physician.  No radiographic interpretation.   NM Bone Scan Whole Body  Result Date: 02/21/2023 CLINICAL DATA:  History of invasive breast cancer, initial workup. EXAM: NUCLEAR MEDICINE WHOLE BODY BONE SCAN TECHNIQUE: Whole body anterior and posterior images were obtained approximately 3 hours after intravenous injection of radiopharmaceutical. RADIOPHARMACEUTICALS:  21.39 mCi Technetium-74m MDP IV COMPARISON:  MRI abdomen March 21, 2019. FINDINGS: Focus of abnormal radiotracer avidity in the posterior ninth rib. Focus of abnormal radiotracer avidity in the right mandible is commonly odontogenic. Multifocal radiotracer uptake in a pattern most consistent with degenerative arthropathy. Mild right hydronephrosis. IMPRESSION: Focus of abnormal radiotracer activity in the posterior ninth rib, statistically more likely to reflect sequela of prior trauma however metastatic disease is not excluded. Suggest correlation with chest CT. Focus of abnormal radiotracer avidity in the right mandible is commonly odontogenic, suggest correlation with direct visualization. Mild right hydronephrosis consider correlation with CT abdomen and pelvis. Electronically Signed   By: Maudry Mayhew M.D.   On: 02/21/2023 15:26    Assessment and plan- Patient is a 37 y.o. female diagnosed with breast cancer, currently s/p cycle 1 of TCHP chemotherapy with udenyca support who presents to clinic for acute complaints of diarrhea.    Refractory diarrhea- > 50 episodes of watery diarrhea in last 24 hours, now improved to ~15. C diff and stool studies were negative. Likely secondary to chemotherapy- hercpetin or perjeta most likely. Had maximized imodium and lomotil. Octreotide 100 mcg subq every 8 hours ordered and she'll be able to start medication today. Refill of lomotil sent to pharmacy. Opium tincture was not available. Can uptitrate  octreotide if ongoing diarrhea. Advised to discontinue octreotide within 24 hours of resolution of diarrhea to reduce risk of stool studies. May also consider flex sig to rule out microscopic colitis if ongoing symptoms.  Abnormal LFTs-AST, ALT, alk phos are elevated. AST and ALT > 3 times ULN. Normal bilirubin. Question liver shock secondary to chemotherapy.  AST improving today but persistently elevated (prev 131, today 67). ALT slightly decreased (prev 340, today 331). table. Bilirubin decreased. Alk phos improved  but elevated.  Hypokalemia- K 3.1 yesterday. Received KCl 20 meq IV then started oral K-Dur 20 meq BID. Replete IV potassium in setting of diarrhea as indicated. K 2.9 today. Proceed with KCL 20 meq IV today then continue oral potassium twice a day.  Leukocytosis-WBC elevated consistent with Udenyca administration.  GI Panel and c diff were negative. She remains afebrile.   Updated Dr Alena Bills regarding patient's status. Will plan to have patient follow up with her as scheduled.   Disposition:  Fluids and potassium IV today RTC as scheduled- la  Visit Diagnosis 1. Diarrhea, unspecified type   2. Chemotherapy induced diarrhea   3. Abnormal LFTs   4. Hypokalemia due to excessive gastrointestinal loss of potassium    Patient expressed understanding and was in agreement with this plan. She also understands that She can call clinic at any time with any questions, concerns, or complaints.   Thank you for allowing me to participate in the care of this very pleasant patient.   Consuello Masse, DNP, AGNP-C, AOCNP [facility] [phone]

## 2023-03-21 ENCOUNTER — Other Ambulatory Visit: Payer: Self-pay | Admitting: Internal Medicine

## 2023-03-21 ENCOUNTER — Telehealth: Payer: Self-pay | Admitting: *Deleted

## 2023-03-21 ENCOUNTER — Encounter: Payer: Self-pay | Admitting: Nurse Practitioner

## 2023-03-21 DIAGNOSIS — R21 Rash and other nonspecific skin eruption: Secondary | ICD-10-CM

## 2023-03-21 MED ORDER — AMPHETAMINE-DEXTROAMPHETAMINE 20 MG PO TABS: 20.0000 mg | ORAL_TABLET | Freq: Two times a day (BID) | ORAL | 0 refills | Status: DC

## 2023-03-21 MED ORDER — DOXYCYCLINE HYCLATE 100 MG PO TABS
100.0000 mg | ORAL_TABLET | Freq: Two times a day (BID) | ORAL | 0 refills | Status: DC
Start: 2023-03-21 — End: 2023-07-14

## 2023-03-21 NOTE — Telephone Encounter (Signed)
Spoke with patient. Pt stated that her diarrhea has improved 8-10 loose stools daily.  Pt will continue to use lomotil as directed. She can discuss any dose reduction concerns with her oncologist at her next apt.  Discussed in detail the plan of care. Patient agreeable to start prescription trial of doxycyline 100 mg bid. She stated that she was out in the sun this past week and did not use any UV protection/sunscreen. Per cautioned pt that sun exposure can make the rash worse and also increase her risks for sunburns. Pt is not currently using any anti-acne OTC medications/washes and will continue to refrain from doing so. She was instructed to use luke warm water/mild soap for face washes. I asked pt to avoid using clobetasol on her face. Instead, she can use OTC urea containing moisturizers. Teach back process performed.  Rx for doxy sent to pt's pharmacy.

## 2023-03-21 NOTE — Telephone Encounter (Signed)
Attempted to reach patient to follow-up on pt's mychart msg and discuss Lauren NP's recommendations for the reported rash on pt's face and chest and pt's symptoms of diarrhea. I left a vm requesting pt to return my phone call to discuss her concerns from her mychart msg.   Per Lauren, "we can trial doxycycline 100 mg BID. Do not use clobetasol on the face. If symptoms ongoing, we can trial topical low to moderate steroids. She can stay on the oral doxy through treatment. Symptoms can be exacerbated by sun exposure. Use sunscreen. Avoid anti acne medications. Mild soap and luke warm water. prefer urea containing moisturizer."

## 2023-03-25 ENCOUNTER — Telehealth: Payer: Self-pay | Admitting: Nurse Practitioner

## 2023-03-25 DIAGNOSIS — L708 Other acne: Secondary | ICD-10-CM

## 2023-03-25 MED FILL — Dexamethasone Sodium Phosphate Inj 100 MG/10ML: INTRAMUSCULAR | Qty: 1 | Status: AC

## 2023-03-25 MED FILL — Fosaprepitant Dimeglumine For IV Infusion 150 MG (Base Eq): INTRAVENOUS | Qty: 5 | Status: AC

## 2023-03-25 NOTE — Telephone Encounter (Signed)
Spoke to patient. Acneiform patient has nearly resolved with doxycycline. She can discuss with Dr. Alena Bills at her appt to determine if she wants to stay on doxy through treatment or discontinue and reconsider if recurrent symptoms.

## 2023-03-28 ENCOUNTER — Inpatient Hospital Stay (HOSPITAL_BASED_OUTPATIENT_CLINIC_OR_DEPARTMENT_OTHER): Payer: Managed Care, Other (non HMO) | Admitting: Internal Medicine

## 2023-03-28 ENCOUNTER — Inpatient Hospital Stay: Payer: Managed Care, Other (non HMO)

## 2023-03-28 VITALS — BP 139/90 | HR 74 | Resp 16

## 2023-03-28 VITALS — BP 143/90 | HR 85 | Temp 98.3°F | Wt 185.4 lb

## 2023-03-28 DIAGNOSIS — T451X5A Adverse effect of antineoplastic and immunosuppressive drugs, initial encounter: Secondary | ICD-10-CM | POA: Insufficient documentation

## 2023-03-28 DIAGNOSIS — C50912 Malignant neoplasm of unspecified site of left female breast: Secondary | ICD-10-CM

## 2023-03-28 DIAGNOSIS — Z5111 Encounter for antineoplastic chemotherapy: Secondary | ICD-10-CM | POA: Insufficient documentation

## 2023-03-28 DIAGNOSIS — D72829 Elevated white blood cell count, unspecified: Secondary | ICD-10-CM | POA: Diagnosis not present

## 2023-03-28 DIAGNOSIS — D6481 Anemia due to antineoplastic chemotherapy: Secondary | ICD-10-CM

## 2023-03-28 DIAGNOSIS — K521 Toxic gastroenteritis and colitis: Secondary | ICD-10-CM | POA: Diagnosis not present

## 2023-03-28 DIAGNOSIS — Z171 Estrogen receptor negative status [ER-]: Secondary | ICD-10-CM

## 2023-03-28 DIAGNOSIS — R197 Diarrhea, unspecified: Secondary | ICD-10-CM | POA: Insufficient documentation

## 2023-03-28 LAB — CMP (CANCER CENTER ONLY)
ALT: 71 U/L — ABNORMAL HIGH (ref 0–44)
AST: 26 U/L (ref 15–41)
Albumin: 4 g/dL (ref 3.5–5.0)
Alkaline Phosphatase: 79 U/L (ref 38–126)
Anion gap: 9 (ref 5–15)
BUN: 19 mg/dL (ref 6–20)
CO2: 22 mmol/L (ref 22–32)
Calcium: 9.5 mg/dL (ref 8.9–10.3)
Chloride: 104 mmol/L (ref 98–111)
Creatinine: 0.6 mg/dL (ref 0.44–1.00)
GFR, Estimated: >60 mL/min (ref >60)
Glucose, Bld: 131 mg/dL — ABNORMAL HIGH (ref 70–99)
Potassium: 4 mmol/L (ref 3.5–5.1)
Sodium: 135 mmol/L (ref 135–145)
Total Bilirubin: 0.6 mg/dL (ref 0.3–1.2)
Total Protein: 7.5 g/dL (ref 6.5–8.1)

## 2023-03-28 LAB — CBC WITH DIFFERENTIAL (CANCER CENTER ONLY)
Abs Immature Granulocytes: 0.88 10*3/uL — ABNORMAL HIGH (ref 0.00–0.07)
Basophils Absolute: 0.1 10*3/uL (ref 0.0–0.1)
Basophils Relative: 0 %
Eosinophils Absolute: 0 10*3/uL (ref 0.0–0.5)
Eosinophils Relative: 0 %
HCT: 33.2 % — ABNORMAL LOW (ref 36.0–46.0)
Hemoglobin: 10.9 g/dL — ABNORMAL LOW (ref 12.0–15.0)
Immature Granulocytes: 4 %
Lymphocytes Relative: 13 %
Lymphs Abs: 2.8 10*3/uL (ref 0.7–4.0)
MCH: 27.9 pg (ref 26.0–34.0)
MCHC: 32.8 g/dL (ref 30.0–36.0)
MCV: 84.9 fL (ref 80.0–100.0)
Monocytes Absolute: 0.9 10*3/uL (ref 0.1–1.0)
Monocytes Relative: 4 %
Neutro Abs: 17.4 10*3/uL — ABNORMAL HIGH (ref 1.7–7.7)
Neutrophils Relative %: 79 %
Platelet Count: 388 10*3/uL (ref 150–400)
RBC: 3.91 MIL/uL (ref 3.87–5.11)
RDW: 13.9 % (ref 11.5–15.5)
WBC Count: 21.9 10*3/uL — ABNORMAL HIGH (ref 4.0–10.5)
nRBC: 0 % (ref 0.0–0.2)

## 2023-03-28 MED ORDER — HEPARIN SOD (PORK) LOCK FLUSH 100 UNIT/ML IV SOLN
INTRAVENOUS | Status: AC
  Administered 2023-03-28: 500 [IU]
  Filled 2023-03-28: qty 5

## 2023-03-28 MED ORDER — TRASTUZUMAB-ANNS CHEMO 150 MG IV SOLR
6.0000 mg/kg | Freq: Once | INTRAVENOUS | Status: AC
  Administered 2023-03-28: 504 mg via INTRAVENOUS
  Filled 2023-03-28: qty 24

## 2023-03-28 MED ORDER — SODIUM CHLORIDE 0.9 % IV SOLN
Freq: Once | INTRAVENOUS | Status: AC
  Filled 2023-03-28: qty 250

## 2023-03-28 MED ORDER — HEPARIN SOD (PORK) LOCK FLUSH 100 UNIT/ML IV SOLN
500.0000 [IU] | Freq: Once | INTRAVENOUS | Status: DC | PRN
  Filled 2023-03-28: qty 5

## 2023-03-28 MED ORDER — SODIUM CHLORIDE 0.9 % IV SOLN
900.0000 mg | Freq: Once | INTRAVENOUS | Status: AC
  Administered 2023-03-28: 900 mg via INTRAVENOUS
  Filled 2023-03-28: qty 90

## 2023-03-28 MED ORDER — SODIUM CHLORIDE 0.9 % IV SOLN
10.0000 mg | Freq: Once | INTRAVENOUS | Status: AC
  Administered 2023-03-28: 10 mg via INTRAVENOUS
  Filled 2023-03-28: qty 10

## 2023-03-28 MED ORDER — ACETAMINOPHEN 325 MG PO TABS
650.0000 mg | ORAL_TABLET | Freq: Once | ORAL | Status: AC
  Administered 2023-03-28: 650 mg via ORAL
  Filled 2023-03-28: qty 2

## 2023-03-28 MED ORDER — SODIUM CHLORIDE 0.9 % IV SOLN
420.0000 mg | Freq: Once | INTRAVENOUS | Status: AC
  Administered 2023-03-28: 420 mg via INTRAVENOUS
  Filled 2023-03-28: qty 14

## 2023-03-28 MED ORDER — PALONOSETRON HCL INJECTION 0.25 MG/5ML
0.2500 mg | Freq: Once | INTRAVENOUS | Status: AC
  Administered 2023-03-28: 0.25 mg via INTRAVENOUS
  Filled 2023-03-28: qty 5

## 2023-03-28 MED ORDER — SODIUM CHLORIDE 0.9 % IV SOLN
150.0000 mg | Freq: Once | INTRAVENOUS | Status: AC
  Administered 2023-03-28: 150 mg via INTRAVENOUS
  Filled 2023-03-28: qty 150

## 2023-03-28 MED ORDER — SODIUM CHLORIDE 0.9 % IV SOLN
75.0000 mg/m2 | Freq: Once | INTRAVENOUS | Status: AC
  Administered 2023-03-28: 160 mg via INTRAVENOUS
  Filled 2023-03-28: qty 16

## 2023-03-28 MED ORDER — DIPHENHYDRAMINE HCL 25 MG PO CAPS
50.0000 mg | ORAL_CAPSULE | Freq: Once | ORAL | Status: AC
  Administered 2023-03-28: 50 mg via ORAL
  Filled 2023-03-28: qty 2

## 2023-03-28 NOTE — Progress Notes (Signed)
[facility] CONSULT NOTE  Patient Care Team: Sherron Monday, MD as PCP - General (Internal Medicine) Hulen Luster, RN as Oncology Nurse Navigator  CANCER STAGING   Cancer Staging  Breast cancer Staging form: Breast, AJCC 8th Edition - Clinical: Stage IIB (cT2, cN1, cM0, G3, ER-, PR-, HER2+) - Signed by Michaelyn Barter, MD on 02/17/2023 Histologic grading system: 3 grade system   ASSESSMENT & PLAN:  Cristina Baker 37 y.o. 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  # Left breast invasive ductal cancer, ER/PR negative, HER2 positive, stage IIb # Encounter for antineoplastic chemotherapy -Self detected left breast mass.  Status post biopsy on 02/10/2023.  -CT chest abdomen pelvis and bone scan were reviewed. Ill-defined mixed focus in the posterior right ninth rib corresponding with increased radiotracer uptake on the bone scan.  Nonspecific but suspicious for osseous metastatic disease.  I discussed the finding with Dr. Archer Asa of IR who advised that the lesion is very small in size about 6 mm and is difficult to biopsy.  Recommend short interval CT scan.  Patient denies any prior history of trauma.    -I will repeat CT chest to reevaluate the right ninth rib lesion in 3 to 4 weeks.  -With cycle 1, patient had profuse diarrhea starting day 6 of the treatment.  Required Imodium, Lomotil and octreotide.  She started feeling better in week 3 of the treatment.  Also, AST elevated to 131, ALT 340, ALP 158 with total bilirubin normal from labs 03/17/2023.  Repeat labs from today show WBC to 21.9, hemoglobin 10.9 platelet 388.  CMP showed AST normal, ALT 71 ALP normal.  - She did have transaminitis which could be related to chemotherapy.  However her LFTs have significantly improved.  ALT is less than 2 times upper limit of normal elevated.  So I  would like to proceed with full doses of TCHP as planned.  # Transaminitis -Likely related to chemotherapy.  Significantly improved. -As above.  Will monitor   # Chemotherapy-induced anemia -I will check iron level, B12 and folate next time to rule out other causes.  Monitor.  # Diarrhea -Could be related to chemotherapy versus Perjeta or both -Started on day 6 of cycle 1.  I discussed in detail about the patient about the importance of anti-HER2 treatment with her high risk disease and doing prophylactic antidiarrheal treatment and close monitoring in the clinic on weekly basis.  She was agreeable with the plan.  Around day 4, she will start doing prophylactic Imodium and octreotide injection once a day.  If her diarrhea worsens then she will add Lomotil and increase the octreotide injection to 3 times a day.  # Leukocytosis -Likely reactive from G-CSF and Decadron.  No signs of infection at this time.  # Family history of breast cancer -Genetic testing was negative  # Anxiety -Xanax 0.25 mg twice daily as needed  # History of palpitations  -Reportedly, she had workup with cardiology which was negative.  It was deemed secondary to panic. -On metoprolol.  # Access-port placed by Dr. Tonna Boehringer.  Has Emla cream  Orders Placed This Encounter  Procedures   CT Chest W Contrast    Standing Status:   Future    Standing Expiration Date:   03/27/2024    Order Specific Question:   If indicated for the ordered procedure, I  authorize the administration of contrast media per Radiology protocol    Answer:   Yes    Order Specific Question:   Does the patient have a contrast media/X-ray dye allergy?    Answer:   No    Order Specific Question:   Is patient pregnant?    Answer:   No    Order Specific Question:   Preferred imaging location?    Answer:   [facility]   Iron and TIBC    Standing Status:   Future    Standing Expiration Date:   03/27/2024   Vitamin B12    Standing Status:    Future    Standing Expiration Date:   03/27/2024   Folate    Standing Status:   Future    Standing Expiration Date:   03/27/2024   CBC with Differential/Platelet    Standing Status:   Future    Standing Expiration Date:   03/27/2024   Comprehensive metabolic panel    Standing Status:   Future    Standing Expiration Date:   03/27/2024   RTC in 1 week for MD visit, labs, fluids  The total time spent in the appointment was 30 minutes encounter with patients including review of chart and various tests results, discussions about plan of care and coordination of care plan   All questions were answered. The patient knows to call the clinic with any problems, questions or concerns. No barriers to learning was detected.  Michaelyn Barter, MD 4/15/20241:01 PM   HISTORY OF PRESENTING ILLNESS:  Cristina Baker 37 y.o. 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  She denies any pain or tenderness in the breast.  Denies any discharge.  There is family history of breast cancer in paternal grandmother and 2 aunts.  She had a BRCA testing about 15 years ago which was negative.  She has completed family-planning.  Underwent tubal ligation.  She has 3 children 1 in 70s second in his teen and the youngest is 39 years old.    Interval history She was seen today accompanied by husband prior to cycle 2 of TCHP.  Patient reports that after the Neupogen injection, she started having bone pain.  Claritin did not help much.  And then on day 6 of the treatment she started having nausea and diarrhea.  In 1 week to the diarrhea got very profuse having 15 bowel movements a day.  Breakage of the skin.  She was taking Imodium and Lomotil.  She was seen in [facility] clinic and then was started on octreotide.  Her C. difficile and GI panel was negative.  In week 3, she started going back to her  baseline.  Today she was feeling well.  And she is also been eating better in a week 3.   I have reviewed her chart and materials related to her cancer extensively and collaborated history with the patient. Summary of oncologic history is as follows: Oncology History  Breast cancer  01/26/2023 Initial Diagnosis   Patient felt palpable lump in left breast   02/10/2023 Mammogram     IMPRESSION: 1. There is a suspicious 24 mm mass at the site of palpable concern which is concerning for malignancy. It demonstrates 3 cm of associated calcifications. Recommend ultrasound-guided biopsy for definitive characterization with attention on post marker placement mammogram to assess for extent of calcifications in relation  to the biopsy marking clip. 2. There is an indeterminate LEFT axillary lymph node. Recommend ultrasound-guided biopsy for definitive characterization. 3. No mammographic evidence of malignancy in the RIGHT breast.   02/10/2023 Pathology Results   DIAGNOSIS: A.  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  Size of invasive carcinoma: 1.7 cm in linear length in this sample Histologic grade of invasive carcinoma: Grade 3 Glandular/tubular differentiation score: 3 Nuclear pleomorphism score: 3 Mitotic rate score: 2 Total score: 8 Ductal carcinoma in situ: Present, high-grade Lymphovascular invasion: Indeterminate See comment.  B.  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   Estrogen Receptor (ER) Status: NEGATIVE (LESS THAN 1%)         Internal control cells positive Progesterone Receptor (PgR) Status: Negative HER2 (by immunohistochemistry): Positive Ki-67: Not performed    02/17/2023 Cancer Staging   Staging form: Breast, AJCC 8th Edition - Clinical: Stage IIB (cT2, cN1, cM0, G3, ER-, PR-, HER2+) - Signed by Michaelyn Barter, MD on  02/17/2023 Histologic grading system: 3 grade system   03/07/2023 -  Chemotherapy   Patient is on Treatment Plan : BREAST  Docetaxel + Carboplatin + Trastuzumab + Pertuzumab  (TCHP) q21d       Genetic Testing   Negative genetic testing. No pathogenic variants identified on the [facility] Multi-Cancer+RNA panel. The report date is 03/01/2023.  The Multi-Cancer + RNA Panel offered by [facility] includes sequencing and/or deletion/duplication analysis of the following 70 genes:  AIP*, ALK, APC*, ATM*, AXIN2*, BAP1*, BARD1*, BLM*, BMPR1A*, BRCA1*, BRCA2*, BRIP1*, CDC73*, CDH1*, CDK4, CDKN1B*, CDKN2A, CHEK2*, CTNNA1*, DICER1*, EPCAM, EGFR, FH*, FLCN*, GREM1, HOXB13, KIT, LZTR1, MAX*, MBD4, MEN1*, MET, MITF, MLH1*, MSH2*, MSH3*, MSH6*, MUTYH*, NF1*, NF2*, NTHL1*, PALB2*, PDGFRA, PMS2*, POLD1*, POLE*, POT1*, PRKAR1A*, PTCH1*, PTEN*, RAD51C*, RAD51D*, RB1*, RET, SDHA*, SDHAF2*, SDHB*, SDHC*, SDHD*, SMAD4*, SMARCA4*, SMARCB1*, SMARCE1*, STK11*, SUFU*, TMEM127*, TP53*, TSC1*, TSC2*, VHL*. RNA analysis is performed for * genes.     MEDICAL HISTORY:  Past Medical History:  Diagnosis Date   Abnormal Pap smear of cervix    Adult ADHD    BRCA negative 2015   MyRisk neg; IBIS=18/8%   Breast cancer 01/2023   left   Family history of breast cancer 2015   IBIS=18.8%   Family history of ovarian cancer    Herpes genitalis    Lichen sclerosus    PAC (premature atrial contraction)    PONV (postoperative nausea and vomiting)    nausea   PVC's (premature ventricular contractions)    Tachycardia, unspecified     SURGICAL HISTORY: Past Surgical History:  Procedure Laterality Date   BREAST BIOPSY Left 02/10/2023   [place] Core Bx, Coil Clip - path pending   BREAST BIOPSY Left 02/10/2023   [place] Core Hydromark clip path pending   BREAST BIOPSY Left 02/10/2023   [place] LT BREAST BX W LOC DEV 1ST LESION IMG BX SPEC US GUIDE 02/10/2023 [facility]   CESAREAN SECTION  01/03/2007   CESAREAN SECTION WITH BILATERAL TUBAL  LIGATION N/A 03/26/2016   Procedure: CESAREAN SECTION WITH BILATERAL TUBAL LIGATION;  Surgeon: Conard Novak, MD;  Location: [facility] ORS;  Service: Obstetrics;  Laterality: N/A;   COLPOSCOPY     IUD REMOVAL     PORTACATH PLACEMENT N/A 02/24/2023   Procedure: INSERTION PORT-A-CATH;  Surgeon: Sung Amabile, DO;  Location: [facility] ORS;  Service: General;  Laterality: N/A;    SOCIAL HISTORY: Social History  Socioeconomic History   Marital status: Married    Spouse name: Verdon Cummins   Number of children: 3   Years of education: Not on file   Highest education level: Not on file  Occupational History   Not on file  Tobacco Use   Smoking status: Former    Types: Cigarettes   Smokeless tobacco: Never  Vaping Use   Vaping Use: Never used  Substance and Sexual Activity   Alcohol use: Yes    Comment: rarely   Drug use: No   Sexual activity: Yes    Birth control/protection: Surgical    Comment: tubal ligation  Other Topics Concern   Not on file  Social History Narrative   Not on file   Social Determinants of Health   Financial Resource Strain: Not on file  Food Insecurity: Unknown (02/17/2023)   Hunger Vital Sign    Worried About Running Out of Food in the Last Year: Never true    Ran Out of Food in the Last Year: Not on file  Transportation Needs: No Transportation Needs (02/17/2023)   PRAPARE - Administrator, Civil Service (Medical): No    Lack of Transportation (Non-Medical): No  Physical Activity: Unknown (06/02/2018)   Exercise Vital Sign    Days of Exercise per Week: 2 days    Minutes of Exercise per Session: Not on file  Stress: Not on file  Social Connections: Not on file  Intimate Partner Violence: Not At Risk (02/17/2023)   Humiliation, Afraid, Rape, and Kick questionnaire    Fear of Current or Ex-Partner: No    Emotionally Abused: No    Physically Abused: No    Sexually Abused: No    FAMILY HISTORY: Family History  Problem Relation Age of Onset    Hypercholesterolemia Mother    Diabetes Mellitus II Father    Hypertension Father    Breast cancer Paternal Aunt 68   Breast cancer Paternal Aunt 26   Breast cancer Paternal Grandmother    Throat cancer Paternal Grandfather    Heart attack Paternal Grandfather     ALLERGIES:  has No Known Allergies.  MEDICATIONS:  Current Outpatient Medications  Medication Sig Dispense Refill   ALPRAZolam (XANAX) 0.5 MG tablet USE AS DIRECTED TAKE HALF TAB TWICE DAILY FOR ANXIETY 15 tablet 2   amphetamine-dextroamphetamine (ADDERALL) 20 MG tablet Take 1 tablet (20 mg total) by mouth 2 (two) times daily. 60 tablet 0   clobetasol ointment (TEMOVATE) 0.05 % Apply to affected area twice daily for 2 weeks, then HS for 2 weeks and then M-W-F HS x 2 weeks 30 g 2   dexamethasone (DECADRON) 4 MG tablet Take 2 tabs by mouth 2 times daily starting day before chemo. Then take 2 tabs daily for 2 days starting day after chemo. Take with food. 30 tablet 1   diphenoxylate-atropine (LOMOTIL) 2.5-0.025 MG tablet Take 1-2 tablets by mouth 4 (four) times daily as needed for diarrhea or loose stools. 120 tablet 0   doxycycline (VIBRA-TABS) 100 MG tablet Take 1 tablet (100 mg total) by mouth 2 (two) times daily. 60 tablet 0   escitalopram (LEXAPRO) 10 MG tablet TAKE 1 TABLET BY MOUTH EVERY DAY IN THE MORNING 90 tablet 1   ibuprofen (ADVIL) 800 MG tablet Take 1 tablet (800 mg total) by mouth every 8 (eight) hours as needed for mild pain or moderate pain. 30 tablet 0   lidocaine-prilocaine (EMLA) cream Apply to affected area once 30 g 3  magic mouthwash w/lidocaine SOLN Take 5ml swish and spit three times daily. 480 mL 3   metoprolol succinate (TOPROL-XL) 25 MG 24 hr tablet TAKE 1 TABLET BY MOUTH ONCE A DAY **MAKE FOLLOW UP APPT WITH DR. KHAN FOR REFILLS** 90 tablet 0   Octreotide Acetate 200 MCG/ML SOLN Inject 0.5 mLs (100 mcg total) into the skin every 8 (eight) hours as needed. 15 mL 0   ondansetron (ZOFRAN) 8 MG tablet Take  1 tablet (8 mg total) by mouth every 8 (eight) hours as needed for nausea or vomiting. Start on the third day after chemotherapy. 30 tablet 1   ondansetron (ZOFRAN-ODT) 4 MG disintegrating tablet Take 1 tablet (4 mg total) by mouth every 8 (eight) hours as needed for nausea or vomiting. 20 tablet 0   oxyCODONE (OXY IR/ROXICODONE) 5 MG immediate release tablet Take 0.5-1 tablets (2.5-5 mg total) by mouth every 6 (six) hours as needed for severe pain. 30 tablet 0   potassium chloride SA (KLOR-CON M) 20 MEQ tablet Take 1 tablet (20 mEq total) by mouth daily. 30 tablet 0   prochlorperazine (COMPAZINE) 10 MG tablet Take 1 tablet (10 mg total) by mouth every 6 (six) hours as needed for nausea or vomiting. 30 tablet 1   SYRINGE-NEEDLE, DISP, 3 ML (B-D 3CC LUER-LOK SYR 25GX5/8") 25G X 5/8" 3 ML MISC use with octreotide 30 each 0   No current facility-administered medications for this visit.   Facility-Administered Medications Ordered in Other Visits  Medication Dose Route Frequency Provider Last Rate Last Admin   CARBOplatin (PARAPLATIN) 900 mg in sodium chloride 0.9 % 500 mL chemo infusion  900 mg Intravenous Once Michaelyn Barter, MD       DOCEtaxel (TAXOTERE) 160 mg in sodium chloride 0.9 % 250 mL chemo infusion  75 mg/m2 (Treatment Plan Recorded) Intravenous Once Michaelyn Barter, MD 266 mL/hr at 03/28/23 1236 160 mg at 03/28/23 1236   heparin lock flush 100 unit/mL  500 Units Intracatheter Once PRN Michaelyn Barter, MD        REVIEW OF SYSTEMS:   Pertinent information mentioned in HPI All other systems were reviewed with the patient and are negative.  PHYSICAL EXAMINATION: ECOG PERFORMANCE STATUS: 0 - Asymptomatic  Vitals:   03/28/23 0900  BP: (!) 143/90  Pulse: 85  Temp: 98.3 F (36.8 C)   Filed Weights   03/28/23 0900  Weight: 185 lb 6.4 oz (84.1 kg)    GENERAL:alert, no distress and comfortable SKIN: skin color, texture, turgor are normal, no rashes or significant lesions EYES:  normal, conjunctiva are pink and non-injected, sclera clear OROPHARYNX:no exudate, no erythema and lips, buccal mucosa, and tongue normal  NECK: supple, thyroid normal size, non-tender, without nodularity LYMPH:  no palpable lymphadenopathy in the cervical, axillary or inguinal LUNGS: clear to auscultation and percussion with normal breathing effort HEART: regular rate & rhythm and no murmurs and no lower extremity edema ABDOMEN:abdomen soft, non-tender and normal bowel sounds Musculoskeletal:no cyanosis of digits and no clubbing  PSYCH: alert & oriented x 3 with fluent speech NEURO: no focal motor/sensory deficits  LABORATORY DATA:  I have reviewed the data as listed Lab Results  Component Value Date   WBC 21.9 (H) 03/28/2023   HGB 10.9 (L) 03/28/2023   HCT 33.2 (L) 03/28/2023   MCV 84.9 03/28/2023   PLT 388 03/28/2023   Recent Labs    03/17/23 0853 03/18/23 1315 03/28/23 0908  NA 132* 134* 135  K 3.1* 2.9* 4.0  CL 108  110 104  CO2 16* 19* 22  GLUCOSE 115* 118* 131*  BUN CREATININE 0.94 0.73 0.60  CALCIUM 9.1 8.8* 9.5  GFRNONAA >60 >60 >60  PROT 7.8 6.8 7.5  ALBUMIN 4.2 3.7 4.0  AST 131* 67* 26  ALT 340* 331* 71*  ALKPHOS 158* 140* 79  BILITOT 0.7 0.2* 0.6    RADIOGRAPHIC STUDIES: I have personally reviewed the radiological images as listed and agreed with the findings in the report. CT CHEST W CONTRAST  Result Date: 03/01/2023 CLINICAL DATA:  History of breast cancer, invasive. Initial workup. * Tracking Code: BO * EXAM: CT CHEST, ABDOMEN, AND PELVIS WITH CONTRAST TECHNIQUE: Multidetector CT imaging of the chest, abdomen and pelvis was performed following the standard protocol during bolus administration of intravenous contrast. RADIATION DOSE REDUCTION: This exam was performed according to the departmental dose-optimization program which includes automated exposure control, adjustment of the mA and/or kV according to patient size and/or use of iterative  reconstruction technique. CONTRAST:  OMNIPAQUE IOHEXOL 300 MG/ML  SOLN COMPARISON:  Nuclear medicine bone scan 02/21/2023. And MRI abdomen March 21, 2019 FINDINGS: CT CHEST FINDINGS Cardiovascular: Right chest Port-A-Cath with tip at the superior cavoatrial junction. Normal caliber thoracic aorta. No central pulmonary embolus on this nondedicated study. Normal size heart. No significant pericardial effusion/thickening. Mediastinum/Nodes: Enlarged left axillary lymph node measures 10 mm in short axis on image 16/2. No pathologically enlarged mediastinal or hilar lymph nodes. Gas fluid level in the esophagus. Lungs/Pleura: No suspicious pulmonary nodules or masses. No pleural effusion. No pneumothorax. Musculoskeletal: Ill-defined left breast mass with biopsy clip in place measures 2.7 cm on image 18/2. Ill-defined mixed focus in the posterior right ninth rib on image 99/4 corresponding with the increased radiotracer uptake seen on prior nuclear medicine bone scan. CT ABDOMEN PELVIS FINDINGS Hepatobiliary: No suspicious hepatic lesion. Gallbladder is unremarkable. No biliary ductal dilation. Pancreas: No pancreatic ductal dilation or evidence of acute inflammation. Spleen: No splenomegaly. Adrenals/Urinary Tract: Bilateral adrenal glands appear normal. Prominence of the right renal collecting system without discrete hydronephrosis. Kidneys demonstrate symmetric enhancement. Urinary bladder is nondistended limiting evaluation. Stomach/Bowel: No radiopaque enteric contrast material was administered. Stomach is unremarkable for degree of distension. No pathologic dilation of small or large bowel. No evidence of acute bowel inflammation. Vascular/Lymphatic: Normal caliber abdominal aorta. Smooth IVC contours. The portal, splenic and superior mesenteric veins are patent. No pathologically enlarged abdominal or pelvic lymph nodes. Reproductive: Nodular thickening of the fundal endometrium. No suspicious adnexal mass.  Other: No significant abdominopelvic free fluid. Musculoskeletal: No aggressive lytic or blastic lesion of bone. IMPRESSION: 1. Ill-defined 2.7 cm left breast mass with biopsy clip in place, consistent with patient's known primary breast neoplasm. 2. Enlarged left axillary lymph node, compatible with local nodal metastatic disease. 3. Ill-defined mixed focus in the posterior right ninth rib corresponding with the increased radiotracer uptake seen on prior nuclear medicine bone scan, nonspecific but suspicious for early osseous metastatic disease. Consider attention on short-term interval follow-up chest CT. 4. No evidence of metastatic disease within the abdomen or pelvis. 5. Nodular thickening of the fundal endometrium, nonspecific possibly physiologic. Consider further evaluation with dedicated pelvic ultrasound. 6. Prominence of the right renal collecting system without discrete hydronephrosis. 7. Gas fluid level in the esophagus, suggestive of gastroesophageal reflux. Electronically Signed   By: Maudry Mayhew M.D.   On: 03/01/2023 11:25   CT Abdomen Pelvis W Contrast  Result Date: 03/01/2023 CLINICAL DATA:  History of breast cancer,  invasive. Initial workup. * Tracking Code: BO * EXAM: CT CHEST, ABDOMEN, AND PELVIS WITH CONTRAST TECHNIQUE: Multidetector CT imaging of the chest, abdomen and pelvis was performed following the standard protocol during bolus administration of intravenous contrast. RADIATION DOSE REDUCTION: This exam was performed according to the departmental dose-optimization program which includes automated exposure control, adjustment of the mA and/or kV according to patient size and/or use of iterative reconstruction technique. CONTRAST:  OMNIPAQUE IOHEXOL 300 MG/ML  SOLN COMPARISON:  Nuclear medicine bone scan 02/21/2023. And MRI abdomen March 21, 2019 FINDINGS: CT CHEST FINDINGS Cardiovascular: Right chest Port-A-Cath with tip at the superior cavoatrial junction. Normal caliber  thoracic aorta. No central pulmonary embolus on this nondedicated study. Normal size heart. No significant pericardial effusion/thickening. Mediastinum/Nodes: Enlarged left axillary lymph node measures 10 mm in short axis on image 16/2. No pathologically enlarged mediastinal or hilar lymph nodes. Gas fluid level in the esophagus. Lungs/Pleura: No suspicious pulmonary nodules or masses. No pleural effusion. No pneumothorax. Musculoskeletal: Ill-defined left breast mass with biopsy clip in place measures 2.7 cm on image 18/2. Ill-defined mixed focus in the posterior right ninth rib on image 99/4 corresponding with the increased radiotracer uptake seen on prior nuclear medicine bone scan. CT ABDOMEN PELVIS FINDINGS Hepatobiliary: No suspicious hepatic lesion. Gallbladder is unremarkable. No biliary ductal dilation. Pancreas: No pancreatic ductal dilation or evidence of acute inflammation. Spleen: No splenomegaly. Adrenals/Urinary Tract: Bilateral adrenal glands appear normal. Prominence of the right renal collecting system without discrete hydronephrosis. Kidneys demonstrate symmetric enhancement. Urinary bladder is nondistended limiting evaluation. Stomach/Bowel: No radiopaque enteric contrast material was administered. Stomach is unremarkable for degree of distension. No pathologic dilation of small or large bowel. No evidence of acute bowel inflammation. Vascular/Lymphatic: Normal caliber abdominal aorta. Smooth IVC contours. The portal, splenic and superior mesenteric veins are patent. No pathologically enlarged abdominal or pelvic lymph nodes. Reproductive: Nodular thickening of the fundal endometrium. No suspicious adnexal mass. Other: No significant abdominopelvic free fluid. Musculoskeletal: No aggressive lytic or blastic lesion of bone. IMPRESSION: 1. Ill-defined 2.7 cm left breast mass with biopsy clip in place, consistent with patient's known primary breast neoplasm. 2. Enlarged left axillary lymph node,  compatible with local nodal metastatic disease. 3. Ill-defined mixed focus in the posterior right ninth rib corresponding with the increased radiotracer uptake seen on prior nuclear medicine bone scan, nonspecific but suspicious for early osseous metastatic disease. Consider attention on short-term interval follow-up chest CT. 4. No evidence of metastatic disease within the abdomen or pelvis. 5. Nodular thickening of the fundal endometrium, nonspecific possibly physiologic. Consider further evaluation with dedicated pelvic ultrasound. 6. Prominence of the right renal collecting system without discrete hydronephrosis. 7. Gas fluid level in the esophagus, suggestive of gastroesophageal reflux. Electronically Signed   By: Maudry Mayhew M.D.   On: 03/01/2023 11:25

## 2023-03-28 NOTE — Patient Instructions (Signed)
[facility] REGIONAL  Discharge Instructions: Thank you for choosing [facility] to provide your oncology and hematology care.  If you have a lab appointment with the Cancer Center, please go directly to the Cancer Center and check in at the registration area.  Wear comfortable clothing and clothing appropriate for easy access to any Portacath or PICC line.   We strive to give you quality time with your provider. You may need to reschedule your appointment if you arrive late (15 or more minutes).  Arriving late affects you and other patients whose appointments are after yours.  Also, if you miss three or more appointments without notifying the office, you may be dismissed from the clinic at the provider's discretion.      For prescription refill requests, have your pharmacy contact our office and allow 72 hours for refills to be completed.    Today you received the following chemotherapy and/or immunotherapy agents Carboplatin, Taxotere, Perjeta, Trastuzumab      To help prevent nausea and vomiting after your treatment, we encourage you to take your nausea medication as directed.  BELOW ARE SYMPTOMS THAT SHOULD BE REPORTED IMMEDIATELY: *FEVER GREATER THAN 100.4 F (38 C) OR HIGHER *CHILLS OR SWEATING *NAUSEA AND VOMITING THAT IS NOT CONTROLLED WITH YOUR NAUSEA MEDICATION *UNUSUAL SHORTNESS OF BREATH *UNUSUAL BRUISING OR BLEEDING *URINARY PROBLEMS (pain or burning when urinating, or frequent urination) *BOWEL PROBLEMS (unusual diarrhea, constipation, pain near the anus) TENDERNESS IN MOUTH AND THROAT WITH OR WITHOUT PRESENCE OF ULCERS (sore throat, sores in mouth, or a toothache) UNUSUAL RASH, SWELLING OR PAIN  UNUSUAL VAGINAL DISCHARGE OR ITCHING   Items with * indicate a potential emergency and should be followed up as soon as possible or go to the Emergency Department if any problems should occur.  Please show the CHEMOTHERAPY ALERT CARD or  IMMUNOTHERAPY ALERT CARD at check-in to the Emergency Department and triage nurse.  Should you have questions after your visit or need to cancel or reschedule your appointment, please contact [facility]  [phone] and follow the prompts.  Office hours are 8:00 a.m. to 4:30 p.m. Monday - Friday. Please note that voicemails left after 4:00 p.m. may not be returned until the following business day.  We are closed weekends and major holidays. You have access to a nurse at all times for urgent questions. Please call the main number to the clinic [phone] and follow the prompts.  For any non-urgent questions, you may also contact your provider using MyChart. We now offer e-Visits for anyone 29 and older to request care online for non-urgent symptoms. For details visit [email or web address].   Also download the MyChart app! Go to the app store, search "MyChart", open the app, select [facility], and log in with your MyChart username and password.

## 2023-03-30 ENCOUNTER — Inpatient Hospital Stay (HOSPITAL_BASED_OUTPATIENT_CLINIC_OR_DEPARTMENT_OTHER): Payer: Managed Care, Other (non HMO) | Admitting: Hospice and Palliative Medicine

## 2023-03-30 ENCOUNTER — Inpatient Hospital Stay: Payer: Managed Care, Other (non HMO)

## 2023-03-30 DIAGNOSIS — C50912 Malignant neoplasm of unspecified site of left female breast: Secondary | ICD-10-CM

## 2023-03-30 DIAGNOSIS — Z171 Estrogen receptor negative status [ER-]: Secondary | ICD-10-CM

## 2023-03-30 MED ORDER — PEGFILGRASTIM-CBQV 6 MG/0.6ML ~~LOC~~ SOSY
6.0000 mg | PREFILLED_SYRINGE | Freq: Once | SUBCUTANEOUS | Status: AC
  Administered 2023-03-30: 6 mg via SUBCUTANEOUS
  Filled 2023-03-30: qty 0.6

## 2023-03-30 NOTE — Progress Notes (Signed)
Multidisciplinary Oncology Council Documentation  Cristina Baker was presented by our [facility] on 03/30/2023, which included representatives from:  Palliative Care Dietitian  Physical/Occupational Therapist Nurse Navigator Genetics Speech Therapist Social work Survivorship RN Financial Navigator Research RN   Cristina Baker currently presents with history of breast cancer  We reviewed previous medical and familial history, history of present illness, and recent lab results along with all available histopathologic and imaging studies. The MOC considered available treatment options and made the following recommendations/referrals:  Nutrition, SW  The MOC is a meeting of clinicians from various specialty areas who evaluate and discuss patients for whom a multidisciplinary approach is being considered. Final determinations in the plan of care are those of the provider(s).   Today's extended care, comprehensive team conference, Cristina Baker was not present for the discussion and was not examined.

## 2023-04-01 ENCOUNTER — Encounter: Payer: Self-pay | Admitting: Licensed Clinical Social Worker

## 2023-04-01 ENCOUNTER — Telehealth: Payer: Self-pay | Admitting: *Deleted

## 2023-04-01 IMAGING — CR DG CHEST 2V
1 series · 2 of 2 positions shown · non-contrast
Comparison: None.

CLINICAL DATA: Cough.

EXAM:
CHEST - 2 VIEW

[Series 1: dg chest 2 view · 0.14mm/px · 2 of 2 slices shown]
[im 1/2]
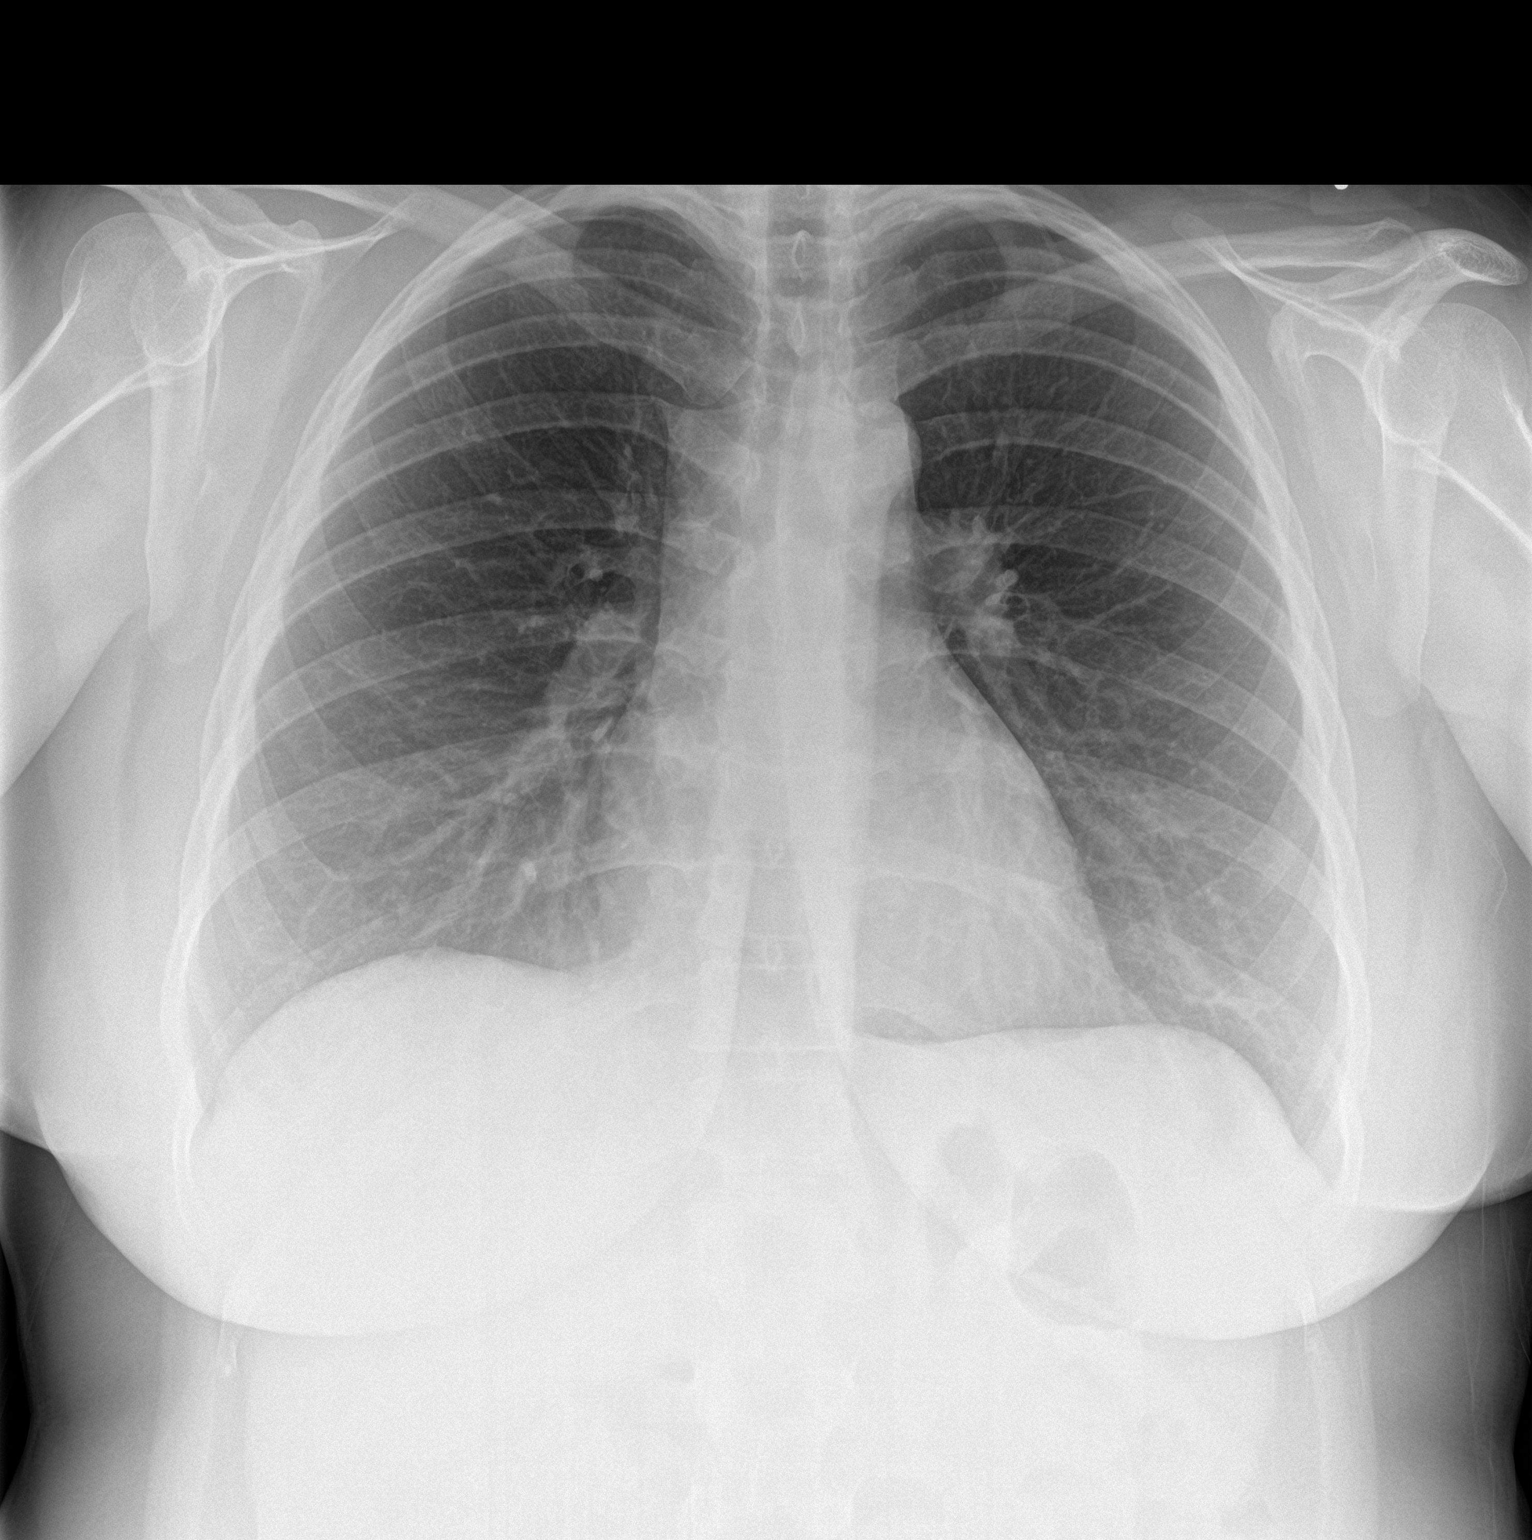
[im 2/2]
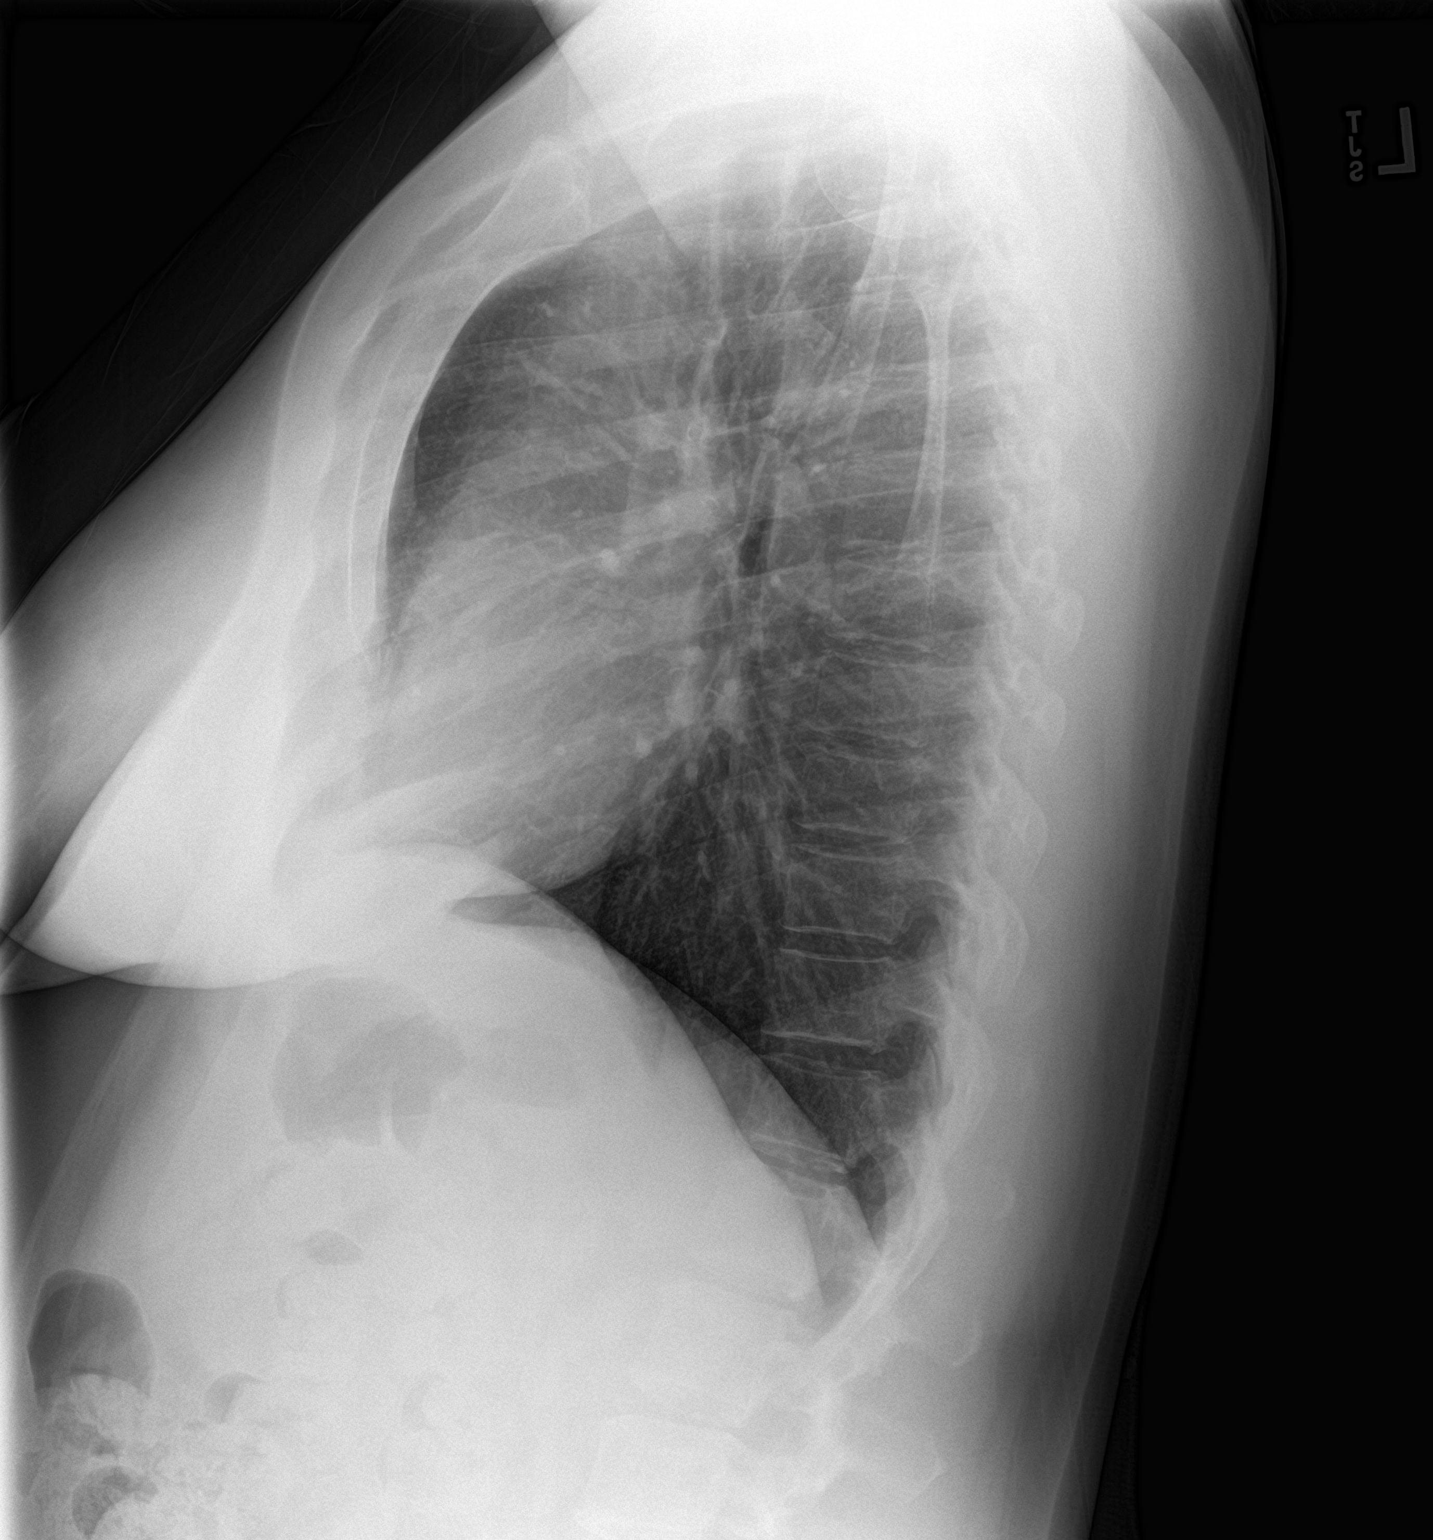

[2 of 2 positions shown; findings below may reference images not displayed]

FINDINGS: The heart size and mediastinal contours are within normal limits.
Both lungs are clear. The visualized skeletal structures are
unremarkable.
IMPRESSION: No active cardiopulmonary disease.

## 2023-04-01 NOTE — Progress Notes (Signed)
  [facility] Clinical Social Work  Clinical Social Work was referred by medical provider for assessment of psychosocial needs.  Clinical Social Worker attempted to contact patient by phone  to offer support and assess for needs.  CSW left voicemail with contact information and rquest for return call.   FA  Joseph Art, LCSW  Clinical Social Worker [facility]

## 2023-04-01 NOTE — Telephone Encounter (Signed)
Notification received that per Laurine Blazer injections will have to be administered at home, only approved for one in clinic administration. Left voicemail for [employer] nurse case manager to begin process. Ph # [phone]. Ext. [phone]

## 2023-04-04 ENCOUNTER — Inpatient Hospital Stay (HOSPITAL_BASED_OUTPATIENT_CLINIC_OR_DEPARTMENT_OTHER): Payer: Managed Care, Other (non HMO) | Admitting: Internal Medicine

## 2023-04-04 ENCOUNTER — Encounter: Payer: Self-pay | Admitting: Internal Medicine

## 2023-04-04 ENCOUNTER — Inpatient Hospital Stay: Payer: Managed Care, Other (non HMO)

## 2023-04-04 VITALS — BP 121/91 | HR 102 | Temp 97.7°F | Resp 18 | Ht 66.0 in | Wt 181.9 lb

## 2023-04-04 DIAGNOSIS — C50912 Malignant neoplasm of unspecified site of left female breast: Secondary | ICD-10-CM

## 2023-04-04 DIAGNOSIS — Z5111 Encounter for antineoplastic chemotherapy: Secondary | ICD-10-CM

## 2023-04-04 DIAGNOSIS — D6481 Anemia due to antineoplastic chemotherapy: Secondary | ICD-10-CM | POA: Diagnosis not present

## 2023-04-04 DIAGNOSIS — G620 Drug-induced polyneuropathy: Secondary | ICD-10-CM | POA: Insufficient documentation

## 2023-04-04 DIAGNOSIS — K521 Toxic gastroenteritis and colitis: Secondary | ICD-10-CM

## 2023-04-04 DIAGNOSIS — T451X5A Adverse effect of antineoplastic and immunosuppressive drugs, initial encounter: Secondary | ICD-10-CM

## 2023-04-04 DIAGNOSIS — E876 Hypokalemia: Secondary | ICD-10-CM | POA: Insufficient documentation

## 2023-04-04 DIAGNOSIS — Z171 Estrogen receptor negative status [ER-]: Secondary | ICD-10-CM

## 2023-04-04 LAB — CBC WITH DIFFERENTIAL/PLATELET
Abs Immature Granulocytes: 0.19 10*3/uL — ABNORMAL HIGH (ref 0.00–0.07)
Basophils Absolute: 0 10*3/uL (ref 0.0–0.1)
Basophils Relative: 0 %
Eosinophils Absolute: 0 10*3/uL (ref 0.0–0.5)
Eosinophils Relative: 0 %
HCT: 34.6 % — ABNORMAL LOW (ref 36.0–46.0)
Hemoglobin: 11.5 g/dL — ABNORMAL LOW (ref 12.0–15.0)
Immature Granulocytes: 2 %
Lymphocytes Relative: 45 %
Lymphs Abs: 4.2 10*3/uL — ABNORMAL HIGH (ref 0.7–4.0)
MCH: 28.1 pg (ref 26.0–34.0)
MCHC: 33.2 g/dL (ref 30.0–36.0)
MCV: 84.6 fL (ref 80.0–100.0)
Monocytes Absolute: 2.1 10*3/uL — ABNORMAL HIGH (ref 0.1–1.0)
Monocytes Relative: 23 %
Neutro Abs: 2.8 10*3/uL (ref 1.7–7.7)
Neutrophils Relative %: 30 %
Platelets: 210 10*3/uL (ref 150–400)
RBC: 4.09 MIL/uL (ref 3.87–5.11)
RDW: 13.3 % (ref 11.5–15.5)
Smear Review: NORMAL
WBC: 9.2 10*3/uL (ref 4.0–10.5)
nRBC: 0.2 % (ref 0.0–0.2)

## 2023-04-04 LAB — VITAMIN B12: Vitamin B-12: 1056 pg/mL — ABNORMAL HIGH (ref 180–914)

## 2023-04-04 LAB — COMPREHENSIVE METABOLIC PANEL
ALT: 66 U/L — ABNORMAL HIGH (ref 0–44)
AST: 25 U/L (ref 15–41)
Albumin: 4.2 g/dL (ref 3.5–5.0)
Alkaline Phosphatase: 82 U/L (ref 38–126)
Anion gap: 8 (ref 5–15)
BUN: 25 mg/dL — ABNORMAL HIGH (ref 6–20)
CO2: 25 mmol/L (ref 22–32)
Calcium: 9.4 mg/dL (ref 8.9–10.3)
Chloride: 100 mmol/L (ref 98–111)
Creatinine, Ser: 0.84 mg/dL (ref 0.44–1.00)
GFR, Estimated: >60 mL/min (ref >60)
Glucose, Bld: 94 mg/dL (ref 70–99)
Potassium: 3.4 mmol/L — ABNORMAL LOW (ref 3.5–5.1)
Sodium: 133 mmol/L — ABNORMAL LOW (ref 135–145)
Total Bilirubin: 0.5 mg/dL (ref 0.3–1.2)
Total Protein: 7.2 g/dL (ref 6.5–8.1)

## 2023-04-04 LAB — IRON AND TIBC
Iron: 64 ug/dL (ref 28–170)
Saturation Ratios: 19 % (ref 10.4–31.8)
TIBC: 340 ug/dL (ref 250–450)
UIBC: 276 ug/dL

## 2023-04-04 LAB — FOLATE: Folate: 11.8 ng/mL (ref >5.9)

## 2023-04-04 MED ORDER — HEPARIN SOD (PORK) LOCK FLUSH 100 UNIT/ML IV SOLN
500.0000 [IU] | Freq: Once | INTRAVENOUS | Status: AC
  Filled 2023-04-04: qty 5

## 2023-04-04 MED ORDER — SODIUM CHLORIDE 0.9% FLUSH
10.0000 mL | Freq: Once | INTRAVENOUS | Status: AC
  Administered 2023-04-04: 10 mL via INTRAVENOUS
  Filled 2023-04-04: qty 10

## 2023-04-04 NOTE — Progress Notes (Signed)
[facility] CONSULT NOTE  Patient Care Team: Sherron Monday, MD as PCP - General (Internal Medicine) Hulen Luster, RN as Oncology Nurse Navigator  CANCER STAGING   Cancer Staging  Breast cancer Staging form: Breast, AJCC 8th Edition - Clinical: Stage IIB (cT2, cN1, cM0, G3, ER-, PR-, HER2+) - Signed by Michaelyn Barter, MD on 02/17/2023 Histologic grading system: 3 grade system   ASSESSMENT & PLAN:  Cristina Baker 37 y.o. 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  # Left breast invasive ductal cancer, ER/PR negative, HER2 positive, stage IIb # Encounter for antineoplastic chemotherapy -Self detected left breast mass.  Status post biopsy on 02/10/2023.  -CT chest abdomen pelvis and bone scan were reviewed. Ill-defined mixed focus in the posterior right ninth rib corresponding with increased radiotracer uptake on the bone scan.  Nonspecific but suspicious for osseous metastatic disease.  I discussed the finding with Dr. Archer Asa of IR who advised that the lesion is very small in size about 6 mm and is difficult to biopsy.  Repeat CT chest scheduled for May 2.  -Labs were reviewed. CBC showed WBC 9.2, hemoglobin 11.5 and platelets of 210.  CMP showed sodium 133, potassium 3.4, creatinine 0.8, AST 25 and ALT 66.  ALP and total bilirubin normal.  She is still eating drinking well.  I will hold off on IV fluids today.  She is doing much better with cycle #2.  I will hold off on scheduling weekly visits.  Patient knows to reach out to the clinic if she develops any symptoms that not manageable.  # Chemotherapy-induced anemia -Vitamin B12, folate and iron panel pending to rule out other causes of anemia.  # Hypokalemia -Mild potassium of 3.4. Patient information provided on food rich in potassium.  Patient advised if her diarrhea increases in  frequency she will have to add potassium pills 20 mEq daily.  # Diarrhea -Doing better with cycle 2 of TCHP.  Having 2 loose bowel movements a day which is manageable with Lomotil twice a day.  Has Imodium and octreotide in hand if needed.  # Chemo induced rash -Started on doxycycline 100 mg twice daily 1 week after cycle 1.  With cycle 2 she only has mild rash on bilateral hands.  I will reassess in couple of weeks and if the rash is resolved we will consider stopping doxycycline.  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  Closely monitor.  # Family history of breast cancer -Genetic testing was negative  # Anxiety -Xanax 0.25 mg twice daily as needed  # History of palpitations  -Reportedly, she had workup with cardiology which was negative.  It was deemed secondary to panic. -On metoprolol.  # Access-port placed by Dr. Tonna Boehringer.  Has Emla cream  Orders Placed This Encounter  Procedures   CBC with Differential (Cancer Center Only)    Standing Status:   Future    Standing Expiration Date:   04/17/2024   CMP (Cancer Center only)    Standing Status:   Future    Standing Expiration Date:   04/17/2024   RTC in 2 weeks for MD visit, labs, cycle 3 TCHP  The total time spent in the appointment was 30 minutes encounter with patients including review of chart and various tests results, discussions  about plan of care and coordination of care plan   All questions were answered. The patient knows to call the clinic with any problems, questions or concerns. No barriers to learning was detected.  Michaelyn Barter, MD 4/22/202412:23 PM   HISTORY OF PRESENTING ILLNESS:  Cristina Baker 37 y.o. 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  She denies any pain or tenderness in  the breast.  Denies any discharge.  There is family history of breast cancer in paternal grandmother and 2 aunts.  She had a BRCA testing about 15 years ago which was negative.  She has completed family-planning.  Underwent tubal ligation.  She has 3 children 1 in 11s second in his teen and the youngest is 39 years old.    Interval history She was seen today accompanied by husband 1 week after cycle 2 of TCHP for symptom management.  She tells me that she is tolerating cycle 2 much better than cycle 1.  Her nausea is manageable with antiemetics.  She is only having 2 loose stools which is manageable with Lomotil twice a day.  She has not used Imodium and octreotide.  Energy levels are better.  For 3 days after Neupogen injection are worse for her because of the body aches.  She bundles herself in warm blanket to get her through.  Takes oxycodone and Claritin.  After the first cycle, she broke out into rash on face, chest.  She was started on doxycycline 100 mg twice daily which she has continued.  With the second cycle, she only had some rash on her bilateral hands.  Noticed papular spot on the left side of the scalp.  She reports she has had that for a long time but feels more prominent after she shaved her head.  We discussed about monitoring for now.  I have reviewed her chart and materials related to her cancer extensively and collaborated history with the patient. Summary of oncologic history is as follows: Oncology History  Breast cancer  01/26/2023 Initial Diagnosis   Patient felt palpable lump in left breast   02/10/2023 Mammogram     IMPRESSION: 1. There is a suspicious 24 mm mass at the site of palpable concern which is concerning for malignancy. It demonstrates 3 cm of associated calcifications. Recommend ultrasound-guided biopsy for definitive characterization with attention on post marker placement mammogram to assess for extent of calcifications in relation to the biopsy marking  clip. 2. There is an indeterminate LEFT axillary lymph node. Recommend ultrasound-guided biopsy for definitive characterization. 3. No mammographic evidence of malignancy in the RIGHT breast.   02/10/2023 Pathology Results   DIAGNOSIS: A.  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  Size of invasive carcinoma: 1.7 cm in linear length in this sample Histologic grade of invasive carcinoma: Grade 3 Glandular/tubular differentiation score: 3 Nuclear pleomorphism score: 3 Mitotic rate score: 2 Total score: 8 Ductal carcinoma in situ: Present, high-grade Lymphovascular invasion: Indeterminate See comment.  B.  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   Estrogen Receptor (ER) Status: NEGATIVE (LESS THAN 1%)         Internal control cells positive Progesterone Receptor (PgR) Status: Negative HER2 (by immunohistochemistry): Positive Ki-67: Not performed    02/17/2023 Cancer Staging   Staging form: Breast, AJCC 8th  Edition - Clinical: Stage IIB (cT2, cN1, cM0, G3, ER-, PR-, HER2+) - Signed by Michaelyn Barter, MD on 02/17/2023 Histologic grading system: 3 grade system   03/07/2023 -  Chemotherapy   Patient is on Treatment Plan : BREAST  Docetaxel + Carboplatin + Trastuzumab + Pertuzumab  (TCHP) q21d       Genetic Testing   Negative genetic testing. No pathogenic variants identified on the [facility] Multi-Cancer+RNA panel. The report date is 03/01/2023.  The Multi-Cancer + RNA Panel offered by [facility] includes sequencing and/or deletion/duplication analysis of the following 70 genes:  AIP*, ALK, APC*, ATM*, AXIN2*, BAP1*, BARD1*, BLM*, BMPR1A*, BRCA1*, BRCA2*, BRIP1*, CDC73*, CDH1*, CDK4, CDKN1B*, CDKN2A, CHEK2*, CTNNA1*, DICER1*, EPCAM, EGFR, FH*, FLCN*, GREM1, HOXB13, KIT, LZTR1, MAX*, MBD4, MEN1*, MET, MITF, MLH1*, MSH2*, MSH3*, MSH6*, MUTYH*, NF1*, NF2*, NTHL1*,  PALB2*, PDGFRA, PMS2*, POLD1*, POLE*, POT1*, PRKAR1A*, PTCH1*, PTEN*, RAD51C*, RAD51D*, RB1*, RET, SDHA*, SDHAF2*, SDHB*, SDHC*, SDHD*, SMAD4*, SMARCA4*, SMARCB1*, SMARCE1*, STK11*, SUFU*, TMEM127*, TP53*, TSC1*, TSC2*, VHL*. RNA analysis is performed for * genes.     MEDICAL HISTORY:  Past Medical History:  Diagnosis Date   Abnormal Pap smear of cervix    Adult ADHD    BRCA negative 2015   MyRisk neg; IBIS=18/8%   Breast cancer 01/2023   left   Family history of breast cancer 2015   IBIS=18.8%   Family history of ovarian cancer    Herpes genitalis    Lichen sclerosus    PAC (premature atrial contraction)    PONV (postoperative nausea and vomiting)    nausea   PVC's (premature ventricular contractions)    Tachycardia, unspecified     SURGICAL HISTORY: Past Surgical History:  Procedure Laterality Date   BREAST BIOPSY Left 02/10/2023   [place] Core Bx, Coil Clip - path pending   BREAST BIOPSY Left 02/10/2023   [place] Core Hydromark clip path pending   BREAST BIOPSY Left 02/10/2023   [place] LT BREAST BX W LOC DEV 1ST LESION IMG BX SPEC US GUIDE 02/10/2023 [facility]   CESAREAN SECTION  01/03/2007   CESAREAN SECTION WITH BILATERAL TUBAL LIGATION N/A 03/26/2016   Procedure: CESAREAN SECTION WITH BILATERAL TUBAL LIGATION;  Surgeon: Conard Novak, MD;  Location: [facility] ORS;  Service: Obstetrics;  Laterality: N/A;   COLPOSCOPY     IUD REMOVAL     PORTACATH PLACEMENT N/A 02/24/2023   Procedure: INSERTION PORT-A-CATH;  Surgeon: Sung Amabile, DO;  Location: [facility] ORS;  Service: General;  Laterality: N/A;    SOCIAL HISTORY: Social History   Socioeconomic History   Marital status: Married    Spouse name: Verdon Cummins   Number of children: 3   Years of education: Not on file   Highest education level: Not on file  Occupational History   Not on file  Tobacco Use   Smoking status: Former    Types: Cigarettes   Smokeless tobacco: Never  Vaping Use   Vaping Use: Never used  Substance and  Sexual Activity   Alcohol use: Yes    Comment: rarely   Drug use: No   Sexual activity: Yes    Birth control/protection: Surgical    Comment: tubal ligation  Other Topics Concern   Not on file  Social History Narrative   Not on file   Social Determinants of Health   Financial Resource Strain: Not on file  Food Insecurity: Unknown (02/17/2023)   Hunger Vital Sign    Worried About Running Out of Food in the Last Year: Never true  Ran Out of Food in the Last Year: Not on file  Transportation Needs: No Transportation Needs (02/17/2023)   PRAPARE - Administrator, Civil Service (Medical): No    Lack of Transportation (Non-Medical): No  Physical Activity: Unknown (06/02/2018)   Exercise Vital Sign    Days of Exercise per Week: 2 days    Minutes of Exercise per Session: Not on file  Stress: Not on file  Social Connections: Not on file  Intimate Partner Violence: Not At Risk (02/17/2023)   Humiliation, Afraid, Rape, and Kick questionnaire    Fear of Current or Ex-Partner: No    Emotionally Abused: No    Physically Abused: No    Sexually Abused: No    FAMILY HISTORY: Family History  Problem Relation Age of Onset   Hypercholesterolemia Mother    Diabetes Mellitus II Father    Hypertension Father    Breast cancer Paternal Aunt 63   Breast cancer Paternal Aunt 39   Breast cancer Paternal Grandmother    Throat cancer Paternal Grandfather    Heart attack Paternal Grandfather     ALLERGIES:  has No Known Allergies.  MEDICATIONS:  Current Outpatient Medications  Medication Sig Dispense Refill   ALPRAZolam (XANAX) 0.5 MG tablet USE AS DIRECTED TAKE HALF TAB TWICE DAILY FOR ANXIETY 15 tablet 2   amphetamine-dextroamphetamine (ADDERALL) 20 MG tablet Take 1 tablet (20 mg total) by mouth 2 (two) times daily. 60 tablet 0   clobetasol ointment (TEMOVATE) 0.05 % Apply to affected area twice daily for 2 weeks, then HS for 2 weeks and then M-W-F HS x 2 weeks 30 g 2    dexamethasone (DECADRON) 4 MG tablet Take 2 tabs by mouth 2 times daily starting day before chemo. Then take 2 tabs daily for 2 days starting day after chemo. Take with food. 30 tablet 1   diphenoxylate-atropine (LOMOTIL) 2.5-0.025 MG tablet Take 1-2 tablets by mouth 4 (four) times daily as needed for diarrhea or loose stools. 120 tablet 0   doxycycline (VIBRA-TABS) 100 MG tablet Take 1 tablet (100 mg total) by mouth 2 (two) times daily. 60 tablet 0   escitalopram (LEXAPRO) 10 MG tablet TAKE 1 TABLET BY MOUTH EVERY DAY IN THE MORNING 90 tablet 1   lidocaine-prilocaine (EMLA) cream Apply to affected area once 30 g 3   magic mouthwash w/lidocaine SOLN Take 5ml swish and spit three times daily. 480 mL 3   metoprolol succinate (TOPROL-XL) 25 MG 24 hr tablet TAKE 1 TABLET BY MOUTH ONCE A DAY **MAKE FOLLOW UP APPT WITH DR. KHAN FOR REFILLS** 90 tablet 0   Octreotide Acetate 200 MCG/ML SOLN Inject 0.5 mLs (100 mcg total) into the skin every 8 (eight) hours as needed. 15 mL 0   ondansetron (ZOFRAN) 8 MG tablet Take 1 tablet (8 mg total) by mouth every 8 (eight) hours as needed for nausea or vomiting. Start on the third day after chemotherapy. 30 tablet 1   oxyCODONE (OXY IR/ROXICODONE) 5 MG immediate release tablet Take 0.5-1 tablets (2.5-5 mg total) by mouth every 6 (six) hours as needed for severe pain. 30 tablet 0   potassium chloride SA (KLOR-CON M) 20 MEQ tablet Take 1 tablet (20 mEq total) by mouth daily. 30 tablet 0   prochlorperazine (COMPAZINE) 10 MG tablet Take 1 tablet (10 mg total) by mouth every 6 (six) hours as needed for nausea or vomiting. 30 tablet 1   SYRINGE-NEEDLE, DISP, 3 ML (B-D 3CC LUER-LOK SYR 25GX5/8") 25G  X 5/8" 3 ML MISC use with octreotide 30 each 0   No current facility-administered medications for this visit.   Facility-Administered Medications Ordered in Other Visits  Medication Dose Route Frequency Provider Last Rate Last Admin   heparin lock flush 100 unit/mL  500 Units  Intravenous Once Michaelyn Barter, MD        REVIEW OF SYSTEMS:   Pertinent information mentioned in HPI All other systems were reviewed with the patient and are negative.  PHYSICAL EXAMINATION: ECOG PERFORMANCE STATUS: 0 - Asymptomatic  Vitals:   04/04/23 1037  BP: (!) 121/91  Pulse: (!) 102  Resp: 18  Temp: 97.7 F (36.5 C)  SpO2: 96%   Filed Weights   04/04/23 1031  Weight: 181 lb 14.4 oz (82.5 kg)    GENERAL:alert, no distress and comfortable SKIN: skin color, texture, turgor are normal, no rashes or significant lesions EYES: normal, conjunctiva are pink and non-injected, sclera clear OROPHARYNX:no exudate, no erythema and lips, buccal mucosa, and tongue normal  NECK: supple, thyroid normal size, non-tender, without nodularity LYMPH:  no palpable lymphadenopathy in the cervical, axillary or inguinal LUNGS: clear to auscultation and percussion with normal breathing effort HEART: regular rate & rhythm and no murmurs and no lower extremity edema ABDOMEN:abdomen soft, non-tender and normal bowel sounds Musculoskeletal:no cyanosis of digits and no clubbing  PSYCH: alert & oriented x 3 with fluent speech NEURO: no focal motor/sensory deficits  LABORATORY DATA:  I have reviewed the data as listed Lab Results  Component Value Date   WBC 9.2 04/04/2023   HGB 11.5 (L) 04/04/2023   HCT 34.6 (L) 04/04/2023   MCV 84.6 04/04/2023   PLT 210 04/04/2023   Recent Labs    03/18/23 1315 03/28/23 0908 04/04/23 1013  NA 134* 135 133*  K 2.9* 4.0 3.4*  CL 110 104 100  CO2 19* 22 25  GLUCOSE 118* 131* 94  BUN 15 19 25*  CREATININE 0.73 0.60 0.84  CALCIUM 8.8* 9.5 9.4  GFRNONAA >60 >60 >60  PROT 6.8 7.5 7.2  ALBUMIN 3.7 4.0 4.2  AST 67* 26 25  ALT 331* 71* 66*  ALKPHOS 140* 79 82  BILITOT 0.2* 0.6 0.5    RADIOGRAPHIC STUDIES: I have personally reviewed the radiological images as listed and agreed with the findings in the report. No results found.

## 2023-04-04 NOTE — Progress Notes (Signed)
Patient reports: Hands are broken out in rash, on antibiotic, has mole on scalp and wants to know if she should see dermatologist, sleeping is hit or miss, no taste, mild nausea meds help, loose stool controlled by lomotil, wants to know if she should refill on injection took a while to get last time, some numbness and tingling in hands and feet

## 2023-04-05 ENCOUNTER — Telehealth: Payer: Self-pay

## 2023-04-05 ENCOUNTER — Encounter: Payer: Self-pay | Admitting: Licensed Clinical Social Worker

## 2023-04-05 ENCOUNTER — Inpatient Hospital Stay: Payer: Managed Care, Other (non HMO) | Admitting: Licensed Clinical Social Worker

## 2023-04-05 NOTE — Telephone Encounter (Signed)
Received call patient has been approved for Udenyca at home for 7 does with [name], requested to fax nursing orders and RX script. Will fax

## 2023-04-05 NOTE — Progress Notes (Signed)
[facility] Clinical Social Work  Clinical Social Work was referred by medical provider for assessment of psychosocial needs.  Clinical Social Worker attempted to contact patient by phone  to offer support and assess for needs.  CSW left voicemail with contact information and request for return call.   SA    Dyllin Gulley, LCSW  Clinical Social Worker [facility]          

## 2023-04-05 NOTE — Progress Notes (Signed)
[facility] Clinical Social Work  Initial Assessment   Cristina Baker is a 37 y.o. year old female contacted by phone. Clinical Social Work was referred by medical provider for assessment of psychosocial needs.   SDOH (Social Determinants of Health) assessments performed: Yes SDOH Interventions    Flowsheet Row Clinical Support from 04/05/2023 in [facility]  SDOH Interventions   Financial Strain Interventions Intervention Not Indicated  Physical Activity Interventions Intervention Not Indicated  Stress Interventions Provide Counseling  Social Connections Interventions Intervention Not Indicated       SDOH Screenings   Food Insecurity: Unknown (02/17/2023)  Transportation Needs: No Transportation Needs (02/17/2023)  Utilities: Not At Risk (02/17/2023)  Alcohol Screen: Low Risk  (06/09/2021)  Depression (PHQ2-9): Low Risk  (02/17/2023)  Financial Resource Strain: Low Risk  (04/05/2023)  Physical Activity: Inactive (04/05/2023)  Social Connections: Socially Integrated (04/05/2023)  Stress: Stress Concern Present (04/05/2023)  Tobacco Use: Medium Risk (04/04/2023)     Distress Screen completed: No     No data to display            Family/Social Information:  Housing Arrangement: patient lives with spouse and minor children  main contact Cristina Baker [phone]  Family members/support persons in your life? Family, Friends, Acupuncturist, and Ambulance person concerns: no  Employment: Working full time  .  Income source: Employment Financial concerns:  no immediate concerns present, little concerned about cost of co-pays  Type of concern: Medical bills and co-pays Food access concerns: no Religious or spiritual practice: Not known Services Currently in place:  intermittent FMLA, [employer] Managed  Coping/ Adjustment to diagnosis: Patient understands treatment plan and what happens next? yes Concerns about diagnosis and/or treatment: Losing  my job and/or losing income and How I will pay for the services I need Patient reported stressors: Finances, Anxiety/ nervousness, Adjusting to my illness, and Isolation/ feeling alone Hopes and/or priorities: continue working Patient enjoys being outside and time with family/ friends Current coping skills/ strengths: Active sense of humor , Average or above average intelligence , Capable of independent living , Manufacturing systems engineer , Contractor , General fund of knowledge , Motivation for treatment/growth , Physical Health , Special hobby/interest , Supportive family/friends , and Work skills     SUMMARY: Current SDOH Barriers:  Limited social support  Clinical Social Work Clinical Goal(s):  No clinical social work goals at this time  Interventions: Discussed common feeling and emotions when being diagnosed with cancer, and the importance of support during treatment Informed patient of the support team roles and support services at [facility] Provided CSW contact information and encouraged patient to call with any questions or concerns Referred patient to financial grant fund assistance and Provided patient with information about CSW role in patient care and other available resource.  CSW will email calendar and support group information to [email or web address]    Follow Up Plan: Patient will contact CSW with any support or resource needs Patient verbalizes understanding of plan: Yes    Cristina Fauver, LCSW

## 2023-04-06 ENCOUNTER — Telehealth: Payer: Self-pay

## 2023-04-06 NOTE — Telephone Encounter (Signed)
Requested by case worker antinontete: faxed udenyca treatment plan to [facility],company that will be supplying at home injection for patient. Informed company to fax over request to cancer center if any additional nursing orders or information needed. Per antinontete to give verbal order to company call [phone]

## 2023-04-06 NOTE — Progress Notes (Signed)
Spoke with Cristina Baker, she will not qualify for the [employer] at this time. I did refer her to [employer] to see if she can get co-pay assistance for her treatment.

## 2023-04-06 NOTE — Progress Notes (Signed)
Called to speak with Cristina Baker regarding the Cristina Baker, reached her voicemail, left her a message to call me back.

## 2023-04-07 ENCOUNTER — Inpatient Hospital Stay: Payer: Managed Care, Other (non HMO)

## 2023-04-10 ENCOUNTER — Other Ambulatory Visit: Payer: Self-pay | Admitting: Nurse Practitioner

## 2023-04-10 DIAGNOSIS — R197 Diarrhea, unspecified: Secondary | ICD-10-CM

## 2023-04-11 ENCOUNTER — Other Ambulatory Visit: Payer: Self-pay

## 2023-04-13 ENCOUNTER — Other Ambulatory Visit: Payer: Self-pay

## 2023-04-13 ENCOUNTER — Encounter: Payer: Self-pay | Admitting: Internal Medicine

## 2023-04-13 MED ORDER — OCTREOTIDE ACETATE 200 MCG/ML IJ SOLN
100.0000 ug | Freq: Three times a day (TID) | INTRAMUSCULAR | 0 refills | Status: DC | PRN
Start: 2023-04-13 — End: 2023-07-14
  Filled 2023-04-13: qty 15, 10d supply, fill #0

## 2023-04-14 ENCOUNTER — Inpatient Hospital Stay: Payer: Managed Care, Other (non HMO) | Attending: Internal Medicine

## 2023-04-14 ENCOUNTER — Other Ambulatory Visit: Payer: Self-pay

## 2023-04-14 ENCOUNTER — Telehealth: Payer: Self-pay

## 2023-04-14 DIAGNOSIS — Z5111 Encounter for antineoplastic chemotherapy: Secondary | ICD-10-CM | POA: Insufficient documentation

## 2023-04-14 DIAGNOSIS — Z79899 Other long term (current) drug therapy: Secondary | ICD-10-CM | POA: Insufficient documentation

## 2023-04-14 DIAGNOSIS — C50912 Malignant neoplasm of unspecified site of left female breast: Secondary | ICD-10-CM | POA: Insufficient documentation

## 2023-04-14 DIAGNOSIS — Z5189 Encounter for other specified aftercare: Secondary | ICD-10-CM | POA: Insufficient documentation

## 2023-04-14 DIAGNOSIS — R197 Diarrhea, unspecified: Secondary | ICD-10-CM | POA: Insufficient documentation

## 2023-04-14 NOTE — Progress Notes (Signed)
Nutrition Assessment   Reason for Assessment:   Referral from [facility]   ASSESSMENT:  37 year old female with left breast cancer Her2 positive, ER, PR negative.  Past medical history of anxiety, ADHD, palpitations.  Patient receiving taxotere, paroplatin, perjeta, kanjinti chemotherapy.    Met with patient in clinic.  Reports that diarrhea was less with 2nd cycle than first.  Has been trying to eat less greasy, spicy foods, more bland items with diarrhea.  Taste is off and mouth feels like sandpaper.  Does not feel food for few days after receiving neupogen.  Has been eating foods like bagels, soups, mashed potatoes, spaghetti.  Family has been helping prepare foods.       Medications: octreotide, KCL, zofran, lomotil, imodium, MMW, dexamethasone   Labs: K 3.4, Na 133   Anthropometrics:   Height: 66 inches Weight: 185 lb 8 oz 183-190 lb noted BMI: 29  Weight stable   NUTRITION DIAGNOSIS: Inadequate oral intake related to cancer related treatment side effects as evidenced by taste alterations, diet modifications due to diarrhea   INTERVENTION:  Reviewed foods to choose with diarrhea. Handout provided Recommend trying enterade BID to help with diarrhea.  Drink 30 minutes before eating or 1 hour after eating.  Samples given Encouraged good sources of protein.  Contact information provided   MONITORING, EVALUATION, GOAL: weight trends, intake   Next Visit:  Tuesday, May 21 phone call  Cord Wilczynski B. Freida Busman, RD, LDN Registered Dietitian [phone]

## 2023-04-14 NOTE — Telephone Encounter (Signed)
Spoke with Rodman Pickle the pharmacy rep at [facility] who stated there is nothing else needed at this time for the Udenyca to be administered at home. We can disregard the message of requesting information. All has been sent. The case is pending as pharmacist is trying to confirm is the nurse is trained to administer at home.

## 2023-04-15 ENCOUNTER — Other Ambulatory Visit: Payer: Self-pay

## 2023-04-15 ENCOUNTER — Ambulatory Visit
Admission: RE | Admit: 2023-04-15 | Discharge: 2023-04-15 | Disposition: A | Discharge status: Home / Self Care | Payer: Managed Care, Other (non HMO) | Source: Ambulatory Visit | Attending: Internal Medicine | Admitting: Internal Medicine

## 2023-04-15 DIAGNOSIS — C50912 Malignant neoplasm of unspecified site of left female breast: Secondary | ICD-10-CM | POA: Insufficient documentation

## 2023-04-15 DIAGNOSIS — Z171 Estrogen receptor negative status [ER-]: Secondary | ICD-10-CM | POA: Insufficient documentation

## 2023-04-15 MED ORDER — IOHEXOL 300 MG/ML  SOLN
75.0000 mL | Freq: Once | INTRAMUSCULAR | Status: AC | PRN
  Administered 2023-04-15: 75 mL via INTRAVENOUS

## 2023-04-15 MED FILL — Dexamethasone Sodium Phosphate Inj 100 MG/10ML: INTRAMUSCULAR | Qty: 1 | Status: AC

## 2023-04-15 MED FILL — Fosaprepitant Dimeglumine For IV Infusion 150 MG (Base Eq): INTRAVENOUS | Qty: 5 | Status: AC

## 2023-04-17 ENCOUNTER — Other Ambulatory Visit: Payer: Self-pay | Admitting: Nurse Practitioner

## 2023-04-17 DIAGNOSIS — R21 Rash and other nonspecific skin eruption: Secondary | ICD-10-CM

## 2023-04-18 ENCOUNTER — Inpatient Hospital Stay (HOSPITAL_BASED_OUTPATIENT_CLINIC_OR_DEPARTMENT_OTHER): Payer: Managed Care, Other (non HMO) | Admitting: Internal Medicine

## 2023-04-18 ENCOUNTER — Encounter: Payer: Self-pay | Admitting: Internal Medicine

## 2023-04-18 ENCOUNTER — Other Ambulatory Visit: Payer: Self-pay

## 2023-04-18 ENCOUNTER — Inpatient Hospital Stay: Payer: Managed Care, Other (non HMO)

## 2023-04-18 VITALS — BP 138/74 | HR 99 | Temp 97.9°F | Resp 18

## 2023-04-18 DIAGNOSIS — C50912 Malignant neoplasm of unspecified site of left female breast: Secondary | ICD-10-CM | POA: Diagnosis present

## 2023-04-18 DIAGNOSIS — E876 Hypokalemia: Secondary | ICD-10-CM | POA: Diagnosis not present

## 2023-04-18 DIAGNOSIS — D72829 Elevated white blood cell count, unspecified: Secondary | ICD-10-CM

## 2023-04-18 DIAGNOSIS — Z5111 Encounter for antineoplastic chemotherapy: Secondary | ICD-10-CM | POA: Diagnosis present

## 2023-04-18 DIAGNOSIS — Z79899 Other long term (current) drug therapy: Secondary | ICD-10-CM | POA: Diagnosis not present

## 2023-04-18 DIAGNOSIS — Z5189 Encounter for other specified aftercare: Secondary | ICD-10-CM | POA: Diagnosis not present

## 2023-04-18 DIAGNOSIS — Z171 Estrogen receptor negative status [ER-]: Secondary | ICD-10-CM

## 2023-04-18 DIAGNOSIS — T451X5A Adverse effect of antineoplastic and immunosuppressive drugs, initial encounter: Secondary | ICD-10-CM

## 2023-04-18 DIAGNOSIS — K521 Toxic gastroenteritis and colitis: Secondary | ICD-10-CM

## 2023-04-18 DIAGNOSIS — D6481 Anemia due to antineoplastic chemotherapy: Secondary | ICD-10-CM

## 2023-04-18 DIAGNOSIS — R197 Diarrhea, unspecified: Secondary | ICD-10-CM | POA: Diagnosis not present

## 2023-04-18 LAB — CBC WITH DIFFERENTIAL (CANCER CENTER ONLY)
Abs Immature Granulocytes: 0.7 10*3/uL — ABNORMAL HIGH (ref 0.00–0.07)
Basophils Absolute: 0.1 10*3/uL (ref 0.0–0.1)
Basophils Relative: 0 %
Eosinophils Absolute: 0 10*3/uL (ref 0.0–0.5)
Eosinophils Relative: 0 %
HCT: 28.3 % — ABNORMAL LOW (ref 36.0–46.0)
Hemoglobin: 9.5 g/dL — ABNORMAL LOW (ref 12.0–15.0)
Immature Granulocytes: 4 %
Lymphocytes Relative: 12 %
Lymphs Abs: 2.5 10*3/uL (ref 0.7–4.0)
MCH: 28.5 pg (ref 26.0–34.0)
MCHC: 33.6 g/dL (ref 30.0–36.0)
MCV: 85 fL (ref 80.0–100.0)
Monocytes Absolute: 0.7 10*3/uL (ref 0.1–1.0)
Monocytes Relative: 4 %
Neutro Abs: 16.1 10*3/uL — ABNORMAL HIGH (ref 1.7–7.7)
Neutrophils Relative %: 80 %
Platelet Count: 274 10*3/uL (ref 150–400)
RBC: 3.33 MIL/uL — ABNORMAL LOW (ref 3.87–5.11)
RDW: 15.7 % — ABNORMAL HIGH (ref 11.5–15.5)
WBC Count: 20 10*3/uL — ABNORMAL HIGH (ref 4.0–10.5)
nRBC: 0 % (ref 0.0–0.2)

## 2023-04-18 LAB — CMP (CANCER CENTER ONLY)
ALT: 46 U/L — ABNORMAL HIGH (ref 0–44)
AST: 28 U/L (ref 15–41)
Albumin: 3.7 g/dL (ref 3.5–5.0)
Alkaline Phosphatase: 78 U/L (ref 38–126)
Anion gap: 9 (ref 5–15)
BUN: 16 mg/dL (ref 6–20)
CO2: 24 mmol/L (ref 22–32)
Calcium: 8.8 mg/dL — ABNORMAL LOW (ref 8.9–10.3)
Chloride: 105 mmol/L (ref 98–111)
Creatinine: 0.68 mg/dL (ref 0.44–1.00)
GFR, Estimated: >60 mL/min (ref >60)
Glucose, Bld: 152 mg/dL — ABNORMAL HIGH (ref 70–99)
Potassium: 3.2 mmol/L — ABNORMAL LOW (ref 3.5–5.1)
Sodium: 138 mmol/L (ref 135–145)
Total Bilirubin: 0.4 mg/dL (ref 0.3–1.2)
Total Protein: 6.4 g/dL — ABNORMAL LOW (ref 6.5–8.1)

## 2023-04-18 LAB — MAGNESIUM: Magnesium: 1.1 mg/dL — ABNORMAL LOW (ref 1.7–2.4)

## 2023-04-18 MED ORDER — SODIUM CHLORIDE 0.9 % IV SOLN
420.0000 mg | Freq: Once | INTRAVENOUS | Status: AC
  Administered 2023-04-18: 420 mg via INTRAVENOUS
  Filled 2023-04-18: qty 14

## 2023-04-18 MED ORDER — DIPHENHYDRAMINE HCL 25 MG PO CAPS
50.0000 mg | ORAL_CAPSULE | Freq: Once | ORAL | Status: AC
  Administered 2023-04-18: 50 mg via ORAL
  Filled 2023-04-18: qty 2

## 2023-04-18 MED ORDER — SODIUM CHLORIDE 0.9 % IV SOLN
10.0000 mg | Freq: Once | INTRAVENOUS | Status: AC
  Administered 2023-04-18: 10 mg via INTRAVENOUS
  Filled 2023-04-18: qty 10

## 2023-04-18 MED ORDER — PALONOSETRON HCL INJECTION 0.25 MG/5ML
0.2500 mg | Freq: Once | INTRAVENOUS | Status: AC
  Administered 2023-04-18: 0.25 mg via INTRAVENOUS
  Filled 2023-04-18: qty 5

## 2023-04-18 MED ORDER — SODIUM CHLORIDE 0.9 % IV SOLN
Freq: Once | INTRAVENOUS | Status: AC
  Filled 2023-04-18: qty 250

## 2023-04-18 MED ORDER — ACETAMINOPHEN 325 MG PO TABS
650.0000 mg | ORAL_TABLET | Freq: Once | ORAL | Status: AC
  Administered 2023-04-18: 650 mg via ORAL
  Filled 2023-04-18: qty 2

## 2023-04-18 MED ORDER — MAGNESIUM SULFATE 4 GM/100ML IV SOLN
4.0000 g | Freq: Once | INTRAVENOUS | Status: AC
  Administered 2023-04-18: 4 g via INTRAVENOUS
  Filled 2023-04-18: qty 100

## 2023-04-18 MED ORDER — SODIUM CHLORIDE 0.9 % IV SOLN
900.0000 mg | Freq: Once | INTRAVENOUS | Status: AC
  Administered 2023-04-18: 900 mg via INTRAVENOUS
  Filled 2023-04-18: qty 90

## 2023-04-18 MED ORDER — SODIUM CHLORIDE 0.9 % IV SOLN
150.0000 mg | Freq: Once | INTRAVENOUS | Status: AC
  Administered 2023-04-18: 150 mg via INTRAVENOUS
  Filled 2023-04-18: qty 150

## 2023-04-18 MED ORDER — TRASTUZUMAB-ANNS CHEMO 150 MG IV SOLR
6.0000 mg/kg | Freq: Once | INTRAVENOUS | Status: AC
  Administered 2023-04-18: 504 mg via INTRAVENOUS
  Filled 2023-04-18: qty 24

## 2023-04-18 MED ORDER — HEPARIN SOD (PORK) LOCK FLUSH 100 UNIT/ML IV SOLN
500.0000 [IU] | Freq: Once | INTRAVENOUS | Status: AC | PRN
  Administered 2023-04-18: 500 [IU]
  Filled 2023-04-18: qty 5

## 2023-04-18 MED ORDER — SODIUM CHLORIDE 0.9 % IV SOLN
75.0000 mg/m2 | Freq: Once | INTRAVENOUS | Status: AC
  Administered 2023-04-18: 160 mg via INTRAVENOUS
  Filled 2023-04-18: qty 16

## 2023-04-18 NOTE — Patient Instructions (Signed)
[place] [facility]  Discharge Instructions: Thank you for choosing [facility] to provide your oncology and hematology care.  If you have a lab appointment with the Cancer Center, please go directly to the Cancer Center and check in at the registration area.  Wear comfortable clothing and clothing appropriate for easy access to any Portacath or PICC line.   We strive to give you quality time with your provider. You may need to reschedule your appointment if you arrive late (15 or more minutes).  Arriving late affects you and other patients whose appointments are after yours.  Also, if you miss three or more appointments without notifying the office, you may be dismissed from the clinic at the provider's discretion.      For prescription refill requests, have your pharmacy contact our office and allow 72 hours for refills to be completed.    Today you received the following chemotherapy and/or immunotherapy agents Kanjinti, Perjeta, Taxotere, Paraplatin and Magnesium       To help prevent nausea and vomiting after your treatment, we encourage you to take your nausea medication as directed.  BELOW ARE SYMPTOMS THAT SHOULD BE REPORTED IMMEDIATELY: *FEVER GREATER THAN 100.4 F (38 C) OR HIGHER *CHILLS OR SWEATING *NAUSEA AND VOMITING THAT IS NOT CONTROLLED WITH YOUR NAUSEA MEDICATION *UNUSUAL SHORTNESS OF BREATH *UNUSUAL BRUISING OR BLEEDING *URINARY PROBLEMS (pain or burning when urinating, or frequent urination) *BOWEL PROBLEMS (unusual diarrhea, constipation, pain near the anus) TENDERNESS IN MOUTH AND THROAT WITH OR WITHOUT PRESENCE OF ULCERS (sore throat, sores in mouth, or a toothache) UNUSUAL RASH, SWELLING OR PAIN  UNUSUAL VAGINAL DISCHARGE OR ITCHING   Items with * indicate a potential emergency and should be followed up as soon as possible or go to the Emergency Department if any problems should occur.  Please show the CHEMOTHERAPY ALERT  CARD or IMMUNOTHERAPY ALERT CARD at check-in to the Emergency Department and triage nurse.  Should you have questions after your visit or need to cancel or reschedule your appointment, please contact [place] [facility]  [phone] and follow the prompts.  Office hours are 8:00 a.m. to 4:30 p.m. Monday - Friday. Please note that voicemails left after 4:00 p.m. may not be returned until the following business day.  We are closed weekends and major holidays. You have access to a nurse at all times for urgent questions. Please call the main number to the clinic [phone] and follow the prompts.  For any non-urgent questions, you may also contact your provider using MyChart. We now offer e-Visits for anyone 67 and older to request care online for non-urgent symptoms. For details visit [email or web address].   Also download the MyChart app! Go to the app store, search "MyChart", open the app, select , and log in with your MyChart username and password.

## 2023-04-18 NOTE — Patient Instructions (Signed)
Take potassium pill 20 meQ once a day for 5 days.

## 2023-04-18 NOTE — Addendum Note (Signed)
Addended byMichaelyn Barter on: 04/18/2023 11:07 AM   Modules accepted: Orders

## 2023-04-18 NOTE — Progress Notes (Signed)
Pt. Is still having some diarrhea after her treatments for about 4-5 days. Has a little bit of tingling and numbness in her fingers and her toes. She is wanting to go over her results from her CT Scan from 04/15/2023.

## 2023-04-18 NOTE — Progress Notes (Addendum)
[facility] CONSULT NOTE  Patient Care Team: Sherron Monday, MD as PCP - General (Internal Medicine) Hulen Luster, RN as Oncology Nurse Navigator  CANCER STAGING   Cancer Staging  Breast cancer [facility]) Staging form: Breast, AJCC 8th Edition - Clinical: Stage IIB (cT2, cN1, cM0, G3, ER-, PR-, HER2+) - Signed by Michaelyn Barter, MD on 02/17/2023 Histologic grading system: 3 grade system   ASSESSMENT & PLAN:  Cristina Baker 37 y.o. 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  # Left breast invasive ductal cancer, ER/PR negative, HER2 positive, stage IIb # Encounter for antineoplastic chemotherapy -Self detected left breast mass.  Status post biopsy on 02/10/2023.  -CT chest abdomen pelvis (03/01/2023)- Ill-defined mixed focus in the posterior right ninth rib corresponding with increased radiotracer uptake on the bone scan.  Nonspecific but suspicious for osseous metastatic disease.  I discussed the finding with Dr. Archer Asa of IR who advised that the lesion is very small in size about 6 mm and is difficult to biopsy.  Repeat CT chest pending.  -Labs reviewed and acceptable for treatment.  Will proceed with cycle 3 of TCHP.  Will schedule for mammogram of left side to assess treatment response. Day 3 Udenyca injections at home.  Insurance did not approve in clinic administration. -Follows with Dr. Tonna Boehringer.  # Chemotherapy-induced anemia -Vitamin B12, folate and iron panel normal  # Hypokalemia -Potassium 3.2.  Advised to take K-Lor 20 mEq once daily for 5 days.  Discussed about dietary measures.  # Hypomagnesemia -Magnesium 1.1.  Will proceed with 4 g IV mag sulfate  # Diarrhea -Doing better with cycle 2 of TCHP.  Continue with Lomotil and octreotide as needed.  # Chemo induced rash -Resolved.  Advised to stop doxycycline.    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  Closely monitor.  # Family history of breast cancer -Genetic testing was negative  # Anxiety -Xanax 0.25 mg twice daily as needed  # History of palpitations  -Reportedly, she had workup with cardiology which was negative.  It was deemed secondary to panic. -On metoprolol.  # Access-port placed by Dr. Tonna Boehringer.  Has Emla cream  Orders Placed This Encounter  Procedures   MM DIAG BREAST TOMO BILATERAL    Standing Status:   Future    Standing Expiration Date:   04/17/2024    Order Specific Question:   Reason for Exam (SYMPTOM  OR DIAGNOSIS REQUIRED)    Answer:   assess treatment response to chemotherapy    Order Specific Question:   Is the patient pregnant?    Answer:   No    Order Specific Question:   Preferred imaging location?    Answer:   [facility]   CBC with Differential (Cancer Center Only)    Standing Status:   Future    Standing Expiration Date:   05/09/2024   CMP (Cancer Center only)    Standing Status:   Future    Standing Expiration Date:   05/09/2024   Magnesium    Standing Status:   Future    Standing Expiration Date:   04/17/2024   RTC in 2 weeks for MD visit, labs, cycle 3 TCHP  The total time spent in the appointment was 30 minutes encounter with patients including review of  chart and various tests results, discussions about plan of care and coordination of care plan   All questions were answered. The patient knows to call the clinic with any problems, questions or concerns. No barriers to learning was detected.  Michaelyn Barter, MD 5/6/20249:46 AM   HISTORY OF PRESENTING ILLNESS:  Cristina Baker 37 y.o. 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  She denies any pain or tenderness in the breast.   Denies any discharge.  There is family history of breast cancer in paternal grandmother and 2 aunts.  She had a BRCA testing about 15 years ago which was negative.  She has completed family-planning.  Underwent tubal ligation.  She has 3 children 1 in 18s second in his teen and the youngest is 39 years old.    Interval history Patient was seen today accompanied by husband prior to cycle 3 of TCHP neoadjuvant therapy.  She tolerated cycle 2 much better than cycle 1.  Gets diarrhea in the second week of treatment.  5-6 episodes per day.  Managed with Lomotil and octreotide twice a day.  Her last octreotide was last Friday.  Nausea is manageable with Compazine and Zofran.  Denies any vomiting.  Her appetite fluctuates.  Energy level fluctuates.  Tends to be better in the third week of the treatment.  Had CT chest done results are pending.  I have reviewed her chart and materials related to her cancer extensively and collaborated history with the patient. Summary of oncologic history is as follows: Oncology History  Breast cancer (HCC)  01/26/2023 Initial Diagnosis   Patient felt palpable lump in left breast   02/10/2023 Mammogram     IMPRESSION: 1. There is a suspicious 24 mm mass at the site of palpable concern which is concerning for malignancy. It demonstrates 3 cm of associated calcifications. Recommend ultrasound-guided biopsy for definitive characterization with attention on post marker placement mammogram to assess for extent of calcifications in relation to the biopsy marking clip. 2. There is an indeterminate LEFT axillary lymph node. Recommend ultrasound-guided biopsy for definitive characterization. 3. No mammographic evidence of malignancy in the RIGHT breast.   02/10/2023 Pathology Results   DIAGNOSIS: A.  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  Size of invasive carcinoma: 1.7 cm in linear length in this  sample Histologic grade of invasive carcinoma: Grade 3 Glandular/tubular differentiation score: 3 Nuclear pleomorphism score: 3 Mitotic rate score: 2 Total score: 8 Ductal carcinoma in situ: Present, high-grade Lymphovascular invasion: Indeterminate See comment.  B.  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   Estrogen Receptor (ER) Status: NEGATIVE (LESS THAN 1%)         Internal control cells positive Progesterone Receptor (PgR) Status: Negative HER2 (by immunohistochemistry): Positive Ki-67: Not performed    02/17/2023 Cancer Staging   Staging form: Breast, AJCC 8th Edition - Clinical: Stage IIB (cT2, cN1, cM0, G3, ER-, PR-, HER2+) - Signed by Michaelyn Barter, MD on 02/17/2023 Histologic grading system: 3 grade system   03/07/2023 -  Chemotherapy   Patient is on Treatment Plan : BREAST  Docetaxel + Carboplatin + Trastuzumab + Pertuzumab  (TCHP) q21d       Genetic Testing   Negative genetic testing. No pathogenic variants identified on the [facility] Multi-Cancer+RNA panel. The report date  is 03/01/2023.  The Multi-Cancer + RNA Panel offered by [facility] includes sequencing and/or deletion/duplication analysis of the following 70 genes:  AIP*, ALK, APC*, ATM*, AXIN2*, BAP1*, BARD1*, BLM*, BMPR1A*, BRCA1*, BRCA2*, BRIP1*, CDC73*, CDH1*, CDK4, CDKN1B*, CDKN2A, CHEK2*, CTNNA1*, DICER1*, EPCAM, EGFR, FH*, FLCN*, GREM1, HOXB13, KIT, LZTR1, MAX*, MBD4, MEN1*, MET, MITF, MLH1*, MSH2*, MSH3*, MSH6*, MUTYH*, NF1*, NF2*, NTHL1*, PALB2*, PDGFRA, PMS2*, POLD1*, POLE*, POT1*, PRKAR1A*, PTCH1*, PTEN*, RAD51C*, RAD51D*, RB1*, RET, SDHA*, SDHAF2*, SDHB*, SDHC*, SDHD*, SMAD4*, SMARCA4*, SMARCB1*, SMARCE1*, STK11*, SUFU*, TMEM127*, TP53*, TSC1*, TSC2*, VHL*. RNA analysis is performed for * genes.     MEDICAL HISTORY:  Past Medical History:  Diagnosis Date   Abnormal Pap smear of cervix    Adult ADHD    BRCA negative 2015   MyRisk neg;  IBIS=18/8%   Breast cancer (HCC) 01/2023   left   Family history of breast cancer 2015   IBIS=18.8%   Family history of ovarian cancer    Herpes genitalis    Lichen sclerosus    PAC (premature atrial contraction)    PONV (postoperative nausea and vomiting)    nausea   PVC's (premature ventricular contractions)    Tachycardia, unspecified     SURGICAL HISTORY: Past Surgical History:  Procedure Laterality Date   BREAST BIOPSY Left 02/10/2023   [place] Core Bx, Coil Clip - path pending   BREAST BIOPSY Left 02/10/2023   [place] Core Hydromark clip path pending   BREAST BIOPSY Left 02/10/2023   [place] LT BREAST BX W LOC DEV 1ST LESION IMG BX SPEC US GUIDE 02/10/2023 [facility]   CESAREAN SECTION  01/03/2007   CESAREAN SECTION WITH BILATERAL TUBAL LIGATION N/A 03/26/2016   Procedure: CESAREAN SECTION WITH BILATERAL TUBAL LIGATION;  Surgeon: Conard Novak, MD;  Location: [facility] ORS;  Service: Obstetrics;  Laterality: N/A;   COLPOSCOPY     IUD REMOVAL     PORTACATH PLACEMENT N/A 02/24/2023   Procedure: INSERTION PORT-A-CATH;  Surgeon: Sung Amabile, DO;  Location: [facility] ORS;  Service: General;  Laterality: N/A;    SOCIAL HISTORY: Social History   Socioeconomic History   Marital status: Married    Spouse name: Verdon Cummins   Number of children: 3   Years of education: Not on file   Highest education level: Not on file  Occupational History   Not on file  Tobacco Use   Smoking status: Former    Types: Cigarettes   Smokeless tobacco: Never  Vaping Use   Vaping Use: Never used  Substance and Sexual Activity   Alcohol use: Yes    Comment: rarely   Drug use: No   Sexual activity: Yes    Birth control/protection: Surgical    Comment: tubal ligation  Other Topics Concern   Not on file  Social History Narrative   Not on file   Social Determinants of Health   Financial Resource Strain: Low Risk  (04/05/2023)   Overall Financial Resource Strain (CARDIA)    Difficulty of Paying Living  Expenses: Not very hard  Food Insecurity: No Food Insecurity (04/05/2023)   Hunger Vital Sign    Worried About Running Out of Food in the Last Year: Never true    Ran Out of Food in the Last Year: Never true  Transportation Needs: No Transportation Needs (02/17/2023)   PRAPARE - Administrator, Civil Service (Medical): No    Lack of Transportation (Non-Medical): No  Physical Activity: Inactive (04/05/2023)   Exercise Vital Sign    Days of Exercise  per Week: 0 days    Minutes of Exercise per Session: 0 min  Stress: Stress Concern Present (04/05/2023)   [employer] of Occupational Health - Occupational Stress Questionnaire    Feeling of Stress : To some extent  Social Connections: Socially Integrated (04/05/2023)   Social Connection and Isolation Panel [NHANES]    Frequency of Communication with Friends and Family: Three times a week    Frequency of Social Gatherings with Friends and Family: Once a week    Attends Religious Services: 1 to 4 times per year    Active Member of [employer] or Organizations: Yes    Attends Banker Meetings: 1 to 4 times per year    Marital Status: Married  Catering manager Violence: Not At Risk (02/17/2023)   Humiliation, Afraid, Rape, and Kick questionnaire    Fear of Current or Ex-Partner: No    Emotionally Abused: No    Physically Abused: No    Sexually Abused: No    FAMILY HISTORY: Family History  Problem Relation Age of Onset   Hypercholesterolemia Mother    Diabetes Mellitus II Father    Hypertension Father    Breast cancer Paternal Aunt 66   Breast cancer Paternal Aunt 64   Breast cancer Paternal Grandmother    Throat cancer Paternal Grandfather    Heart attack Paternal Grandfather     ALLERGIES:  has No Known Allergies.  MEDICATIONS:  Current Outpatient Medications  Medication Sig Dispense Refill   ALPRAZolam (XANAX) 0.5 MG tablet USE AS DIRECTED TAKE HALF TAB TWICE DAILY FOR ANXIETY 15 tablet 2    amphetamine-dextroamphetamine (ADDERALL) 20 MG tablet Take 1 tablet (20 mg total) by mouth 2 (two) times daily. 60 tablet 0   clobetasol ointment (TEMOVATE) 0.05 % Apply to affected area twice daily for 2 weeks, then HS for 2 weeks and then M-W-F HS x 2 weeks 30 g 2   dexamethasone (DECADRON) 4 MG tablet Take 2 tabs by mouth 2 times daily starting day before chemo. Then take 2 tabs daily for 2 days starting day after chemo. Take with food. 30 tablet 1   diphenoxylate-atropine (LOMOTIL) 2.5-0.025 MG tablet Take 1-2 tablets by mouth 4 (four) times daily as needed for diarrhea or loose stools. 120 tablet 0   doxycycline (VIBRA-TABS) 100 MG tablet Take 1 tablet (100 mg total) by mouth 2 (two) times daily. 60 tablet 0   escitalopram (LEXAPRO) 10 MG tablet TAKE 1 TABLET BY MOUTH EVERY DAY IN THE MORNING 90 tablet 1   lidocaine-prilocaine (EMLA) cream Apply to affected area once 30 g 3   magic mouthwash w/lidocaine SOLN Take 5ml swish and spit three times daily. 480 mL 3   metoprolol succinate (TOPROL-XL) 25 MG 24 hr tablet TAKE 1 TABLET BY MOUTH ONCE A DAY **MAKE FOLLOW UP APPT WITH DR. KHAN FOR REFILLS** 90 tablet 0   Octreotide Acetate 200 MCG/ML SOLN Inject 0.5 mLs (100 mcg total) into the skin every 8 (eight) hours as needed. 15 mL 0   ondansetron (ZOFRAN) 8 MG tablet Take 1 tablet (8 mg total) by mouth every 8 (eight) hours as needed for nausea or vomiting. Start on the third day after chemotherapy. 30 tablet 1   oxyCODONE (OXY IR/ROXICODONE) 5 MG immediate release tablet Take 0.5-1 tablets (2.5-5 mg total) by mouth every 6 (six) hours as needed for severe pain. 30 tablet 0   prochlorperazine (COMPAZINE) 10 MG tablet Take 1 tablet (10 mg total) by mouth every 6 (six)  hours as needed for nausea or vomiting. 30 tablet 1   SYRINGE-NEEDLE, DISP, 3 ML (B-D 3CC LUER-LOK SYR 25GX5/8") 25G X 5/8" 3 ML MISC use with octreotide 30 each 0   No current facility-administered medications for this visit.    Facility-Administered Medications Ordered in Other Visits  Medication Dose Route Frequency Provider Last Rate Last Admin   heparin lock flush 100 unit/mL  500 Units Intravenous Once Michaelyn Barter, MD        REVIEW OF SYSTEMS:   Pertinent information mentioned in HPI All other systems were reviewed with the patient and are negative.  PHYSICAL EXAMINATION: ECOG PERFORMANCE STATUS: 0 - Asymptomatic  There were no vitals filed for this visit.  There were no vitals filed for this visit.   GENERAL:alert, no distress and comfortable SKIN: skin color, texture, turgor are normal, no rashes or significant lesions EYES: normal, conjunctiva are pink and non-injected, sclera clear OROPHARYNX:no exudate, no erythema and lips, buccal mucosa, and tongue normal  NECK: supple, thyroid normal size, non-tender, without nodularity LYMPH:  no palpable lymphadenopathy in the cervical, axillary or inguinal LUNGS: clear to auscultation and percussion with normal breathing effort HEART: regular rate & rhythm and no murmurs and no lower extremity edema ABDOMEN:abdomen soft, non-tender and normal bowel sounds Musculoskeletal:no cyanosis of digits and no clubbing  PSYCH: alert & oriented x 3 with fluent speech NEURO: no focal motor/sensory deficits  LABORATORY DATA:  I have reviewed the data as listed Lab Results  Component Value Date   WBC 20.0 (H) 04/18/2023   HGB 9.5 (L) 04/18/2023   HCT 28.3 (L) 04/18/2023   MCV 85.0 04/18/2023   PLT 274 04/18/2023   Recent Labs    03/28/23 0908 04/04/23 1013 04/18/23 0846  NA 135 133* 138  K 4.0 3.4* 3.2*  CL 104 100 105  CO2 22 25 24   GLUCOSE 131* 94 152*  BUN 19 25* 16  CREATININE 0.60 0.84 0.68  CALCIUM 9.5 9.4 8.8*  GFRNONAA >60 >60 >60  PROT 7.5 7.2 6.4*  ALBUMIN 4.0 4.2 3.7  AST 26 25 28   ALT 71* 66* 46*  ALKPHOS 79 82 78  BILITOT 0.6 0.5 0.4    RADIOGRAPHIC STUDIES: I have personally reviewed the radiological images as listed and  agreed with the findings in the report. No results found.

## 2023-04-18 NOTE — Addendum Note (Signed)
Addended byMichaelyn Barter on: 04/18/2023 10:24 AM   Modules accepted: Orders

## 2023-04-19 ENCOUNTER — Telehealth: Payer: Self-pay | Admitting: *Deleted

## 2023-04-19 ENCOUNTER — Other Ambulatory Visit: Payer: Self-pay | Admitting: Nurse Practitioner

## 2023-04-19 ENCOUNTER — Other Ambulatory Visit (HOSPITAL_BASED_OUTPATIENT_CLINIC_OR_DEPARTMENT_OTHER): Payer: Self-pay

## 2023-04-19 ENCOUNTER — Other Ambulatory Visit: Payer: Self-pay | Admitting: Internal Medicine

## 2023-04-19 DIAGNOSIS — C50912 Malignant neoplasm of unspecified site of left female breast: Secondary | ICD-10-CM

## 2023-04-19 MED ORDER — AMPHETAMINE-DEXTROAMPHETAMINE 20 MG PO TABS: 20.0000 mg | ORAL_TABLET | Freq: Two times a day (BID) | ORAL | 0 refills | Status: DC

## 2023-04-19 NOTE — Telephone Encounter (Signed)
Dr. Mervyn Skeeters is it ok to add on to schedule for [facility] for tomorrow?

## 2023-04-19 NOTE — Telephone Encounter (Signed)
Patient called and said she just got a call from company that is to give her post chemotherapy injection at home and was told that she will need to get one last injection in our office and they will start with home injection with her next chemotherapy treatment. She is asking if she can be added back to our schedule for her injection tomorrow

## 2023-04-20 ENCOUNTER — Inpatient Hospital Stay: Payer: Managed Care, Other (non HMO)

## 2023-04-20 DIAGNOSIS — Z5111 Encounter for antineoplastic chemotherapy: Secondary | ICD-10-CM | POA: Diagnosis not present

## 2023-04-20 DIAGNOSIS — C50912 Malignant neoplasm of unspecified site of left female breast: Secondary | ICD-10-CM

## 2023-04-20 LAB — MAGNESIUM: Magnesium: 2.1 mg/dL (ref 1.7–2.4)

## 2023-04-20 MED ORDER — PEGFILGRASTIM-CBQV 6 MG/0.6ML ~~LOC~~ SOSY
6.0000 mg | PREFILLED_SYRINGE | Freq: Once | SUBCUTANEOUS | Status: AC
  Administered 2023-04-20: 6 mg via SUBCUTANEOUS
  Filled 2023-04-20: qty 0.6

## 2023-04-21 ENCOUNTER — Encounter: Payer: Self-pay | Admitting: *Deleted

## 2023-04-25 ENCOUNTER — Inpatient Hospital Stay: Payer: Managed Care, Other (non HMO)

## 2023-04-25 ENCOUNTER — Other Ambulatory Visit: Payer: Self-pay | Admitting: *Deleted

## 2023-04-25 ENCOUNTER — Telehealth: Payer: Self-pay

## 2023-04-25 DIAGNOSIS — R531 Weakness: Secondary | ICD-10-CM

## 2023-04-25 DIAGNOSIS — R110 Nausea: Secondary | ICD-10-CM

## 2023-04-25 DIAGNOSIS — Z5111 Encounter for antineoplastic chemotherapy: Secondary | ICD-10-CM | POA: Diagnosis not present

## 2023-04-25 DIAGNOSIS — C50912 Malignant neoplasm of unspecified site of left female breast: Secondary | ICD-10-CM

## 2023-04-25 LAB — CBC WITH DIFFERENTIAL (CANCER CENTER ONLY)
Abs Immature Granulocytes: 0.03 10*3/uL (ref 0.00–0.07)
Basophils Absolute: 0.1 10*3/uL (ref 0.0–0.1)
Basophils Relative: 1 %
Eosinophils Absolute: 0 10*3/uL (ref 0.0–0.5)
Eosinophils Relative: 0 %
HCT: 33.7 % — ABNORMAL LOW (ref 36.0–46.0)
Hemoglobin: 11.3 g/dL — ABNORMAL LOW (ref 12.0–15.0)
Immature Granulocytes: 1 %
Lymphocytes Relative: 48 %
Lymphs Abs: 2.6 10*3/uL (ref 0.7–4.0)
MCH: 28.5 pg (ref 26.0–34.0)
MCHC: 33.5 g/dL (ref 30.0–36.0)
MCV: 84.9 fL (ref 80.0–100.0)
Monocytes Absolute: 1.3 10*3/uL — ABNORMAL HIGH (ref 0.1–1.0)
Monocytes Relative: 25 %
Neutro Abs: 1.3 10*3/uL — ABNORMAL LOW (ref 1.7–7.7)
Neutrophils Relative %: 25 %
Platelet Count: 226 10*3/uL (ref 150–400)
RBC: 3.97 MIL/uL (ref 3.87–5.11)
RDW: 14.7 % (ref 11.5–15.5)
Smear Review: NORMAL
WBC Count: 5.3 10*3/uL (ref 4.0–10.5)
nRBC: 0 % (ref 0.0–0.2)

## 2023-04-25 LAB — CMP (CANCER CENTER ONLY)
ALT: 70 U/L — ABNORMAL HIGH (ref 0–44)
AST: 23 U/L (ref 15–41)
Albumin: 4 g/dL (ref 3.5–5.0)
Alkaline Phosphatase: 78 U/L (ref 38–126)
Anion gap: 10 (ref 5–15)
BUN: 21 mg/dL — ABNORMAL HIGH (ref 6–20)
CO2: 22 mmol/L (ref 22–32)
Calcium: 9.3 mg/dL (ref 8.9–10.3)
Chloride: 101 mmol/L (ref 98–111)
Creatinine: 0.73 mg/dL (ref 0.44–1.00)
GFR, Estimated: >60 mL/min (ref >60)
Glucose, Bld: 108 mg/dL — ABNORMAL HIGH (ref 70–99)
Potassium: 3.5 mmol/L (ref 3.5–5.1)
Sodium: 133 mmol/L — ABNORMAL LOW (ref 135–145)
Total Bilirubin: 0.6 mg/dL (ref 0.3–1.2)
Total Protein: 7.2 g/dL (ref 6.5–8.1)

## 2023-04-25 MED ORDER — HEPARIN SOD (PORK) LOCK FLUSH 100 UNIT/ML IV SOLN
500.0000 [IU] | Freq: Once | INTRAVENOUS | Status: AC
  Administered 2023-04-25: 500 [IU] via INTRAVENOUS
  Filled 2023-04-25: qty 5

## 2023-04-25 MED ORDER — SODIUM CHLORIDE 0.9% FLUSH
10.0000 mL | Freq: Once | INTRAVENOUS | Status: AC
  Administered 2023-04-25: 10 mL via INTRAVENOUS
  Filled 2023-04-25: qty 10

## 2023-04-25 MED ORDER — SODIUM CHLORIDE 0.9 % IV SOLN
INTRAVENOUS | Status: DC
  Filled 2023-04-25 (×2): qty 250

## 2023-04-25 NOTE — Telephone Encounter (Signed)
Per Sharia Reeve- ok to sch pt for port lab/ fluid clinic. Cbc metc. I contacted patient and scheduled apt for this morning at 1030

## 2023-04-25 NOTE — Telephone Encounter (Signed)
Patient called stating she would like fluids today. Patient states she is very fatigued and has little no energy. Patient states she is very weak. Patient states she has been having some nausea this morning. Patient denies any diarrhea. Patient states she has loose stools but it is normal.

## 2023-05-02 ENCOUNTER — Ambulatory Visit
Admission: RE | Admit: 2023-05-02 | Discharge: 2023-05-02 | Disposition: A | Discharge status: Home / Self Care | Payer: Managed Care, Other (non HMO) | Source: Ambulatory Visit | Attending: Internal Medicine | Admitting: Internal Medicine

## 2023-05-02 ENCOUNTER — Encounter: Payer: Self-pay | Admitting: Radiology

## 2023-05-02 DIAGNOSIS — Z171 Estrogen receptor negative status [ER-]: Secondary | ICD-10-CM | POA: Diagnosis present

## 2023-05-02 DIAGNOSIS — C50912 Malignant neoplasm of unspecified site of left female breast: Secondary | ICD-10-CM

## 2023-05-03 ENCOUNTER — Inpatient Hospital Stay: Payer: Managed Care, Other (non HMO)

## 2023-05-03 NOTE — Progress Notes (Signed)
Nutrition Follow-up:  Patient with left breast cancer, ER/PR negative, HER2 positive.  Patient receiving taxotere, paroplatin, perjeta and kanjinit.    Spoke with patient via phone for nutrition follow-up.  Patient eating a good amount overall.  Taste usually starts coming back after the first week of treatment.  This cycle has eaten more cucumbers in [place] dressing and apple juice.  Breakfast has usually been yogurt but this am ate breakfast casserole with fruit.  Has had baked chicken and salad last night for dinner.  Ate [place] over the weekend.  Yesterday had leftover spaghetti for lunch.  Diarrhea has been a less this cycle.  Has had more nausea this cycle.  Has been taking zofran each am which has helped improve symptoms.  Did purchase some enterade.    Noted was seen in [facility] for fluids  Medications: reviewed  Labs: reviewed  Anthropometrics:   Weight 185 lb on 5/2  183-190 lb noted  NUTRITION DIAGNOSIS: Inadequate oral intake improving    INTERVENTION:  Continue managing symptoms with medication so patient can continue to eat to maintain nutrition Continue enterade  BID Denies any questions or concerns regarding nutrition.  Patient has RD contact information and will reach out to RD if needed in the future    MONITORING, EVALUATION, GOAL: weight trends, intake   NEXT VISIT: no follow-up planned RD available if needed in the future  Hallelujah Wysong B. Freida Busman, RD, LDN Registered Dietitian [phone]

## 2023-05-06 MED FILL — Dexamethasone Sodium Phosphate Inj 100 MG/10ML: INTRAMUSCULAR | Qty: 1 | Status: AC

## 2023-05-06 MED FILL — Fosaprepitant Dimeglumine For IV Infusion 150 MG (Base Eq): INTRAVENOUS | Qty: 5 | Status: AC

## 2023-05-10 ENCOUNTER — Inpatient Hospital Stay: Payer: Managed Care, Other (non HMO)

## 2023-05-10 ENCOUNTER — Inpatient Hospital Stay (HOSPITAL_BASED_OUTPATIENT_CLINIC_OR_DEPARTMENT_OTHER): Payer: Managed Care, Other (non HMO) | Admitting: Internal Medicine

## 2023-05-10 ENCOUNTER — Encounter: Payer: Self-pay | Admitting: Internal Medicine

## 2023-05-10 VITALS — BP 144/103 | HR 83 | Temp 96.3°F | Wt 194.3 lb

## 2023-05-10 DIAGNOSIS — C50912 Malignant neoplasm of unspecified site of left female breast: Secondary | ICD-10-CM

## 2023-05-10 DIAGNOSIS — D6481 Anemia due to antineoplastic chemotherapy: Secondary | ICD-10-CM

## 2023-05-10 DIAGNOSIS — K521 Toxic gastroenteritis and colitis: Secondary | ICD-10-CM | POA: Diagnosis not present

## 2023-05-10 DIAGNOSIS — Z171 Estrogen receptor negative status [ER-]: Secondary | ICD-10-CM

## 2023-05-10 DIAGNOSIS — Z5111 Encounter for antineoplastic chemotherapy: Secondary | ICD-10-CM | POA: Diagnosis not present

## 2023-05-10 DIAGNOSIS — G620 Drug-induced polyneuropathy: Secondary | ICD-10-CM | POA: Diagnosis not present

## 2023-05-10 DIAGNOSIS — T451X5A Adverse effect of antineoplastic and immunosuppressive drugs, initial encounter: Secondary | ICD-10-CM

## 2023-05-10 LAB — CBC WITH DIFFERENTIAL (CANCER CENTER ONLY)
Abs Immature Granulocytes: 0.99 10*3/uL — ABNORMAL HIGH (ref 0.00–0.07)
Basophils Absolute: 0.1 10*3/uL (ref 0.0–0.1)
Basophils Relative: 0 %
Eosinophils Absolute: 0 10*3/uL (ref 0.0–0.5)
Eosinophils Relative: 0 %
HCT: 26.7 % — ABNORMAL LOW (ref 36.0–46.0)
Hemoglobin: 8.7 g/dL — ABNORMAL LOW (ref 12.0–15.0)
Immature Granulocytes: 5 %
Lymphocytes Relative: 17 %
Lymphs Abs: 3.4 10*3/uL (ref 0.7–4.0)
MCH: 29.9 pg (ref 26.0–34.0)
MCHC: 32.6 g/dL (ref 30.0–36.0)
MCV: 91.8 fL (ref 80.0–100.0)
Monocytes Absolute: 1.1 10*3/uL — ABNORMAL HIGH (ref 0.1–1.0)
Monocytes Relative: 6 %
Neutro Abs: 13.8 10*3/uL — ABNORMAL HIGH (ref 1.7–7.7)
Neutrophils Relative %: 72 %
Platelet Count: 257 10*3/uL (ref 150–400)
RBC: 2.91 MIL/uL — ABNORMAL LOW (ref 3.87–5.11)
RDW: 18.8 % — ABNORMAL HIGH (ref 11.5–15.5)
WBC Count: 19.4 10*3/uL — ABNORMAL HIGH (ref 4.0–10.5)
nRBC: 0.2 % (ref 0.0–0.2)

## 2023-05-10 LAB — CMP (CANCER CENTER ONLY)
ALT: 51 U/L — ABNORMAL HIGH (ref 0–44)
AST: 35 U/L (ref 15–41)
Albumin: 3.5 g/dL (ref 3.5–5.0)
Alkaline Phosphatase: 70 U/L (ref 38–126)
Anion gap: 13 (ref 5–15)
BUN: 21 mg/dL — ABNORMAL HIGH (ref 6–20)
CO2: 23 mmol/L (ref 22–32)
Calcium: 8.9 mg/dL (ref 8.9–10.3)
Chloride: 103 mmol/L (ref 98–111)
Creatinine: 0.64 mg/dL (ref 0.44–1.00)
GFR, Estimated: >60 mL/min (ref >60)
Glucose, Bld: 131 mg/dL — ABNORMAL HIGH (ref 70–99)
Potassium: 3.5 mmol/L (ref 3.5–5.1)
Sodium: 139 mmol/L (ref 135–145)
Total Bilirubin: 0.2 mg/dL — ABNORMAL LOW (ref 0.3–1.2)
Total Protein: 6.4 g/dL — ABNORMAL LOW (ref 6.5–8.1)

## 2023-05-10 LAB — MAGNESIUM: Magnesium: 1.2 mg/dL — ABNORMAL LOW (ref 1.7–2.4)

## 2023-05-10 MED ORDER — SODIUM CHLORIDE 0.9 % IV SOLN
75.0000 mg/m2 | Freq: Once | INTRAVENOUS | Status: AC
  Administered 2023-05-10: 160 mg via INTRAVENOUS
  Filled 2023-05-10: qty 16

## 2023-05-10 MED ORDER — PALONOSETRON HCL INJECTION 0.25 MG/5ML
0.2500 mg | Freq: Once | INTRAVENOUS | Status: AC
  Administered 2023-05-10: 0.25 mg via INTRAVENOUS
  Filled 2023-05-10: qty 5

## 2023-05-10 MED ORDER — SODIUM CHLORIDE 0.9 % IV SOLN
420.0000 mg | Freq: Once | INTRAVENOUS | Status: AC
  Administered 2023-05-10: 420 mg via INTRAVENOUS
  Filled 2023-05-10: qty 14

## 2023-05-10 MED ORDER — SODIUM CHLORIDE 0.9 % IV SOLN
10.0000 mg | Freq: Once | INTRAVENOUS | Status: AC
  Administered 2023-05-10: 10 mg via INTRAVENOUS
  Filled 2023-05-10: qty 10

## 2023-05-10 MED ORDER — MAGNESIUM OXIDE -MG SUPPLEMENT 400 (240 MG) MG PO TABS: 400.0000 mg | ORAL_TABLET | Freq: Two times a day (BID) | ORAL | 0 refills | Status: DC

## 2023-05-10 MED ORDER — ACETAMINOPHEN 325 MG PO TABS
650.0000 mg | ORAL_TABLET | Freq: Once | ORAL | Status: AC
  Administered 2023-05-10: 650 mg via ORAL
  Filled 2023-05-10: qty 2

## 2023-05-10 MED ORDER — HEPARIN SOD (PORK) LOCK FLUSH 100 UNIT/ML IV SOLN
500.0000 [IU] | Freq: Once | INTRAVENOUS | Status: AC | PRN
  Administered 2023-05-10: 500 [IU]
  Filled 2023-05-10: qty 5

## 2023-05-10 MED ORDER — SODIUM CHLORIDE 0.9 % IV SOLN
Freq: Once | INTRAVENOUS | Status: AC
  Filled 2023-05-10: qty 250

## 2023-05-10 MED ORDER — SODIUM CHLORIDE 0.9 % IV SOLN
150.0000 mg | Freq: Once | INTRAVENOUS | Status: AC
  Administered 2023-05-10: 150 mg via INTRAVENOUS
  Filled 2023-05-10: qty 150

## 2023-05-10 MED ORDER — TRASTUZUMAB-ANNS CHEMO 150 MG IV SOLR
6.0000 mg/kg | Freq: Once | INTRAVENOUS | Status: AC
  Administered 2023-05-10: 504 mg via INTRAVENOUS
  Filled 2023-05-10: qty 24

## 2023-05-10 MED ORDER — DIPHENHYDRAMINE HCL 25 MG PO CAPS
50.0000 mg | ORAL_CAPSULE | Freq: Once | ORAL | Status: AC
  Administered 2023-05-10: 50 mg via ORAL
  Filled 2023-05-10: qty 2

## 2023-05-10 MED ORDER — SODIUM CHLORIDE 0.9 % IV SOLN
900.0000 mg | Freq: Once | INTRAVENOUS | Status: AC
  Administered 2023-05-10: 900 mg via INTRAVENOUS
  Filled 2023-05-10: qty 90

## 2023-05-10 MED ORDER — MAGNESIUM SULFATE 4 GM/100ML IV SOLN
4.0000 g | Freq: Once | INTRAVENOUS | Status: AC
  Administered 2023-05-10: 4 g via INTRAVENOUS
  Filled 2023-05-10: qty 100

## 2023-05-10 NOTE — Progress Notes (Signed)
Pt says her neuropathy has been bothering her more now, especially when she is typing.

## 2023-05-10 NOTE — Progress Notes (Signed)
[facility] CONSULT NOTE  Patient Care Team: Sherron Monday, MD as PCP - General (Internal Medicine) Hulen Luster, RN as Oncology Nurse Navigator  CANCER STAGING   Cancer Staging  Breast cancer [facility]) Staging form: Breast, AJCC 8th Edition - Clinical: Stage IIB (cT2, cN1, cM0, G3, ER-, PR-, HER2+) - Signed by Michaelyn Barter, MD on 02/17/2023 Histologic grading system: 3 grade system   ASSESSMENT & PLAN:  Cristina Baker 37 y.o. 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  # Left breast invasive ductal cancer, ER/PR negative, HER2 positive, stage IIb # Encounter for antineoplastic chemotherapy -Self detected left breast mass.  Status post biopsy on 02/10/2023.  -CT chest abdomen pelvis (03/01/2023)- Ill-defined mixed focus in the posterior right ninth rib corresponding with increased radiotracer uptake on the bone scan.  Nonspecific but suspicious for osseous metastatic disease.  I discussed the finding with Dr. Archer Asa of IR who advised that the lesion is very small in size about 6 mm and is difficult to biopsy. Repeat CT chest on 04/15/2023 showed unchanged right ninth rib lesion.  - Mid treatment mammogram in ultrasound showed complete resolution of the breast mass. Labs reviewed and acceptable for treatment.  Will proceed with cycle 4 of TCHP regimen.  She will get Udenyca injection on Thursday at home.  - Schedule for echocardiogram for cardiac function monitoring on Herceptin every 3 months until completion of treatment.  -Follows with Dr. Tonna Boehringer.  # Chemotherapy-induced anemia -Vitamin B12, folate and iron panel normal  # Hypomagnesemia -Magnesium 1.2.  Will proceed with 4 g IV mag sulfate -Started on oral magnesium oxide 400 mg twice daily.  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  Closely monitor.  # Family history of breast cancer -Genetic testing was negative  # Anxiety -Xanax 0.25 mg twice daily as needed  # History of palpitations  -Reportedly, she had workup with cardiology which was negative.  It was deemed secondary to panic. -On metoprolol.  # Access-port placed by Dr. Tonna Boehringer.  Has Emla cream  Orders Placed This Encounter  Procedures   CBC with Differential (Cancer Center Only)    Standing Status:   Future    Standing Expiration Date:   05/30/2024   CMP (Cancer Center only)    Standing Status:   Future    Standing Expiration Date:   05/30/2024   CBC with Differential (Cancer Center Only)    Standing Status:   Future    Standing Expiration Date:   06/20/2024   CMP (Cancer Center only)    Standing Status:   Future    Standing Expiration Date:   06/20/2024   RTC in 3 weeks for MD visit, labs, cycle 5 TCHP  The total time spent in the appointment was 30 minutes encounter with patients including review of chart and various tests results, discussions about plan of care and coordination of care plan   All questions were answered. The patient knows to call the clinic with any problems, questions or concerns. No barriers to learning was detected.  Michaelyn Barter, MD 5/28/202411:13 AM   HISTORY OF PRESENTING ILLNESS:  Cristina Baker 37 y.o. 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  She denies any pain or tenderness in the breast.  Denies any discharge.  There is family history of breast cancer in paternal grandmother and 2 aunts.  She had a BRCA testing about 15 years ago which was negative.  She has completed family-planning.  Underwent tubal ligation.   She has 3 children 1 in 35s second in his teen and the youngest is 39 years old.    Interval history Patient was seen today accompanied by husband prior to cycle 4 of TCHP neoadjuvant therapy.  Diarrhea present - lomotil, imodium, octreotide for couple days Fatigue more  Eating  No vomiting. Some nausea.  Past Thursday and Friday - some stiffness in hands while working on keyboard, phone Feet- tingling intermittent  Bloodblisters in bilateral feet- starting of 3rd week. Resolved now  I have reviewed her chart and materials related to her cancer extensively and collaborated history with the patient. Summary of oncologic history is as follows: Oncology History  Breast cancer (HCC)  01/26/2023 Initial Diagnosis   Patient felt palpable lump in left breast   02/10/2023 Mammogram     IMPRESSION: 1. There is a suspicious 24 mm mass at the site of palpable concern which is concerning for malignancy. It demonstrates 3 cm of associated calcifications. Recommend ultrasound-guided biopsy for definitive characterization with attention on post marker placement mammogram to assess for extent of calcifications in relation to the biopsy marking clip. 2. There is an indeterminate LEFT axillary lymph node. Recommend ultrasound-guided biopsy for definitive characterization. 3. No mammographic evidence of malignancy in the RIGHT breast.   02/10/2023 Pathology Results   DIAGNOSIS: A.  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  Size of invasive carcinoma: 1.7 cm in linear length in this sample Histologic grade of invasive carcinoma: Grade 3 Glandular/tubular differentiation score: 3 Nuclear pleomorphism score: 3 Mitotic rate score: 2 Total score: 8 Ductal carcinoma in situ: Present, high-grade Lymphovascular invasion: Indeterminate See comment.  B.  LYMPH NODE, LEFT AXILLA; STEREOTACTIC CORE BIOPSY: - INVOLVED BY INVASIVE MAMMARY  CARCINOMA, NO SPECIAL TYPE.   Estrogen Receptor (ER) Status: NEGATIVE (LESS THAN 1%)         Internal control cells positive Progesterone Receptor (PgR) Status: Negative HER2 (by immunohistochemistry): Positive Ki-67: Not performed    02/17/2023 Cancer Staging   Staging form: Breast, AJCC 8th Edition - Clinical: Stage IIB (cT2, cN1, cM0, G3, ER-, PR-, HER2+) - Signed by Michaelyn Barter, MD on 02/17/2023 Histologic grading system: 3 grade system   03/07/2023 -  Chemotherapy   Patient is on Treatment Plan : BREAST  Docetaxel + Carboplatin + Trastuzumab + Pertuzumab  (TCHP) q21d       Genetic Testing   Negative genetic testing. No pathogenic variants identified on the [facility] Multi-Cancer+RNA panel. The report date is 03/01/2023.  The Multi-Cancer + RNA Panel offered by [facility] includes sequencing and/or deletion/duplication analysis of the following 70 genes:  AIP*, ALK, APC*, ATM*, AXIN2*, BAP1*, BARD1*, BLM*, BMPR1A*, BRCA1*, BRCA2*, BRIP1*, CDC73*, CDH1*, CDK4, CDKN1B*, CDKN2A, CHEK2*, CTNNA1*, DICER1*, EPCAM, EGFR, FH*, FLCN*, GREM1, HOXB13, KIT, LZTR1, MAX*, MBD4, MEN1*, MET, MITF, MLH1*, MSH2*, MSH3*, MSH6*, MUTYH*, NF1*, NF2*, NTHL1*, PALB2*, PDGFRA, PMS2*, POLD1*, POLE*, POT1*, PRKAR1A*, PTCH1*, PTEN*, RAD51C*, RAD51D*, RB1*, RET, SDHA*, SDHAF2*, SDHB*, SDHC*, SDHD*, SMAD4*, SMARCA4*, SMARCB1*, SMARCE1*, STK11*, SUFU*, TMEM127*, TP53*, TSC1*, TSC2*, VHL*. RNA analysis is performed for * genes.  MEDICAL HISTORY:  Past Medical History:  Diagnosis Date   Abnormal Pap smear of cervix    Adult ADHD    BRCA negative 2015   MyRisk neg; IBIS=18/8%   Breast cancer (HCC) 01/2023   left   Family history of breast cancer 2015   IBIS=18.8%   Family history of ovarian cancer    Herpes genitalis    Lichen sclerosus    PAC (premature atrial contraction)    PONV (postoperative nausea and vomiting)    nausea   PVC's (premature ventricular contractions)    Tachycardia, unspecified      SURGICAL HISTORY: Past Surgical History:  Procedure Laterality Date   BREAST BIOPSY Left 02/10/2023   invasive mammary carcinoma   BREAST BIOPSY Left 02/10/2023   [place] Core Hydromark clip path pending   BREAST BIOPSY Left 02/10/2023   [place] LT BREAST BX W LOC DEV 1ST LESION IMG BX SPEC US GUIDE 02/10/2023 [facility]   CESAREAN SECTION  01/03/2007   CESAREAN SECTION WITH BILATERAL TUBAL LIGATION N/A 03/26/2016   Procedure: CESAREAN SECTION WITH BILATERAL TUBAL LIGATION;  Surgeon: Conard Novak, MD;  Location: [facility] ORS;  Service: Obstetrics;  Laterality: N/A;   COLPOSCOPY     IUD REMOVAL     PORTACATH PLACEMENT N/A 02/24/2023   Procedure: INSERTION PORT-A-CATH;  Surgeon: Sung Amabile, DO;  Location: [facility] ORS;  Service: General;  Laterality: N/A;    SOCIAL HISTORY: Social History   Socioeconomic History   Marital status: Married    Spouse name: Verdon Cummins   Number of children: 3   Years of education: Not on file   Highest education level: Not on file  Occupational History   Not on file  Tobacco Use   Smoking status: Former    Types: Cigarettes   Smokeless tobacco: Never  Vaping Use   Vaping Use: Never used  Substance and Sexual Activity   Alcohol use: Yes    Comment: rarely   Drug use: No   Sexual activity: Yes    Birth control/protection: Surgical    Comment: tubal ligation  Other Topics Concern   Not on file  Social History Narrative   Not on file   Social Determinants of Health   Financial Resource Strain: Low Risk  (04/05/2023)   Overall Financial Resource Strain (CARDIA)    Difficulty of Paying Living Expenses: Not very hard  Food Insecurity: No Food Insecurity (04/05/2023)   Hunger Vital Sign    Worried About Running Out of Food in the Last Year: Never true    Ran Out of Food in the Last Year: Never true  Transportation Needs: No Transportation Needs (02/17/2023)   PRAPARE - Administrator, Civil Service (Medical): No    Lack of Transportation  (Non-Medical): No  Physical Activity: Inactive (04/05/2023)   Exercise Vital Sign    Days of Exercise per Week: 0 days    Minutes of Exercise per Session: 0 min  Stress: Stress Concern Present (04/05/2023)   [employer] of Occupational Health - Occupational Stress Questionnaire    Feeling of Stress : To some extent  Social Connections: Socially Integrated (04/05/2023)   Social Connection and Isolation Panel [NHANES]    Frequency of Communication with Friends and Family: Three times a week    Frequency of Social Gatherings with Friends and Family: Once a week    Attends Religious Services: 1 to 4 times per year    Active Member of [employer] or Organizations: Yes  Attends Banker Meetings: 1 to 4 times per year    Marital Status: Married  Catering manager Violence: Not At Risk (02/17/2023)   Humiliation, Afraid, Rape, and Kick questionnaire    Fear of Current or Ex-Partner: No    Emotionally Abused: No    Physically Abused: No    Sexually Abused: No    FAMILY HISTORY: Family History  Problem Relation Age of Onset   Hypercholesterolemia Mother    Diabetes Mellitus II Father    Hypertension Father    Breast cancer Paternal Aunt 7   Breast cancer Paternal Aunt 51   Breast cancer Paternal Grandmother    Throat cancer Paternal Grandfather    Heart attack Paternal Grandfather     ALLERGIES:  has No Known Allergies.  MEDICATIONS:  Current Outpatient Medications  Medication Sig Dispense Refill   ALPRAZolam (XANAX) 0.5 MG tablet USE AS DIRECTED TAKE HALF TAB TWICE DAILY FOR ANXIETY 15 tablet 2   amphetamine-dextroamphetamine (ADDERALL) 20 MG tablet Take 1 tablet (20 mg total) by mouth 2 (two) times daily. 60 tablet 0   clobetasol ointment (TEMOVATE) 0.05 % Apply to affected area twice daily for 2 weeks, then HS for 2 weeks and then M-W-F HS x 2 weeks 30 g 2   dexamethasone (DECADRON) 4 MG tablet Take 2 tabs by mouth 2 times daily starting day before chemo. Then take 2  tabs daily for 2 days starting day after chemo. Take with food. 30 tablet 1   diphenoxylate-atropine (LOMOTIL) 2.5-0.025 MG tablet Take 1-2 tablets by mouth 4 (four) times daily as needed for diarrhea or loose stools. 120 tablet 0   doxycycline (VIBRA-TABS) 100 MG tablet Take 1 tablet (100 mg total) by mouth 2 (two) times daily. 60 tablet 0   escitalopram (LEXAPRO) 10 MG tablet TAKE 1 TABLET BY MOUTH EVERY DAY IN THE MORNING 90 tablet 1   lidocaine-prilocaine (EMLA) cream Apply to affected area once 30 g 3   magic mouthwash w/lidocaine SOLN Take 5ml swish and spit three times daily. 480 mL 3   magnesium oxide (MAG-OX) 400 (240 Mg) MG tablet Take 1 tablet (400 mg total) by mouth 2 (two) times daily. 120 tablet 0   metoprolol succinate (TOPROL-XL) 25 MG 24 hr tablet TAKE 1 TABLET BY MOUTH ONCE A DAY **MAKE FOLLOW UP APPT WITH DR. KHAN FOR REFILLS** 90 tablet 0   Octreotide Acetate 200 MCG/ML SOLN Inject 0.5 mLs (100 mcg total) into the skin every 8 (eight) hours as needed. 15 mL 0   ondansetron (ZOFRAN) 8 MG tablet Take 1 tablet (8 mg total) by mouth every 8 (eight) hours as needed for nausea or vomiting. Start on the third day after chemotherapy. 30 tablet 1   oxyCODONE (OXY IR/ROXICODONE) 5 MG immediate release tablet Take 0.5-1 tablets (2.5-5 mg total) by mouth every 6 (six) hours as needed for severe pain. 30 tablet 0   prochlorperazine (COMPAZINE) 10 MG tablet Take 1 tablet (10 mg total) by mouth every 6 (six) hours as needed for nausea or vomiting. 30 tablet 1   SYRINGE-NEEDLE, DISP, 3 ML (B-D 3CC LUER-LOK SYR 25GX5/8") 25G X 5/8" 3 ML MISC use with octreotide 30 each 0   No current facility-administered medications for this visit.   Facility-Administered Medications Ordered in Other Visits  Medication Dose Route Frequency Provider Last Rate Last Admin   CARBOplatin (PARAPLATIN) 900 mg in sodium chloride 0.9 % 500 mL chemo infusion  900 mg Intravenous Once Michaelyn Barter, MD  DOCEtaxel  (TAXOTERE) 160 mg in sodium chloride 0.9 % 250 mL chemo infusion  75 mg/m2 (Treatment Plan Recorded) Intravenous Once Michaelyn Barter, MD       heparin lock flush 100 unit/mL  500 Units Intravenous Once Michaelyn Barter, MD       heparin lock flush 100 unit/mL  500 Units Intracatheter Once PRN Michaelyn Barter, MD       magnesium sulfate IVPB 4 g 100 mL  4 g Intravenous Once Michaelyn Barter, MD       pertuzumab (PERJETA) 420 mg in sodium chloride 0.9 % 250 mL chemo infusion  420 mg Intravenous Once Michaelyn Barter, MD       trastuzumab-anns Ernestine Mcmurray) 504 mg in sodium chloride 0.9 % 250 mL chemo infusion  6 mg/kg (Treatment Plan Recorded) Intravenous Once Michaelyn Barter, MD 548 mL/hr at 05/10/23 1059 504 mg at 05/10/23 1059    REVIEW OF SYSTEMS:   Pertinent information mentioned in HPI All other systems were reviewed with the patient and are negative.  PHYSICAL EXAMINATION: ECOG PERFORMANCE STATUS: 0 - Asymptomatic  Vitals:   05/10/23 0925  BP: (!) 144/103  Pulse: 83  Temp: (!) 96.3 F (35.7 C)  SpO2: 99%    Filed Weights   05/10/23 0925  Weight: 194 lb 4.8 oz (88.1 kg)     GENERAL:alert, no distress and comfortable SKIN: skin color, texture, turgor are normal, no rashes or significant lesions EYES: normal, conjunctiva are pink and non-injected, sclera clear OROPHARYNX:no exudate, no erythema and lips, buccal mucosa, and tongue normal  NECK: supple, thyroid normal size, non-tender, without nodularity LYMPH:  no palpable lymphadenopathy in the cervical, axillary or inguinal LUNGS: clear to auscultation and percussion with normal breathing effort HEART: regular rate & rhythm and no murmurs and no lower extremity edema ABDOMEN:abdomen soft, non-tender and normal bowel sounds Musculoskeletal:no cyanosis of digits and no clubbing  PSYCH: alert & oriented x 3 with fluent speech NEURO: no focal motor/sensory deficits  LABORATORY DATA:  I have reviewed the data as listed Lab  Results  Component Value Date   WBC 19.4 (H) 05/10/2023   HGB 8.7 (L) 05/10/2023   HCT 26.7 (L) 05/10/2023   MCV 91.8 05/10/2023   PLT 257 05/10/2023   Recent Labs    04/18/23 0846 04/25/23 1048 05/10/23 0903  NA 138 133* 139  K 3.2* 3.5 3.5  CL 105 101 103  CO2 24 22 23   GLUCOSE 152* 108* 131*  BUN 16 21* 21*  CREATININE 0.68 0.73 0.64  CALCIUM 8.8* 9.3 8.9  GFRNONAA >60 >60 >60  PROT 6.4* 7.2 6.4*  ALBUMIN 3.7 4.0 3.5  AST 28 23 35  ALT 46* 70* 51*  ALKPHOS 78 78 70  BILITOT 0.4 0.6 0.2*    RADIOGRAPHIC STUDIES: I have personally reviewed the radiological images as listed and agreed with the findings in the report. MM DIAG BREAST TOMO UNI LEFT  Result Date: 05/02/2023 CLINICAL DATA:  37 year old female with biopsy proven invasive mammary carcinoma in the upper-outer quadrant of the left breast and metastatic axillary adenopathy. EXAM: DIGITAL DIAGNOSTIC UNILATERAL LEFT MAMMOGRAM WITH TOMOSYNTHESIS; ULTRASOUND LEFT BREAST LIMITED TECHNIQUE: Left digital diagnostic mammography and breast tomosynthesis was performed.; Targeted ultrasound examination of the left breast was performed. COMPARISON:  Previous exam(s). ACR Breast Density Category b: There are scattered areas of fibroglandular density. FINDINGS: There is a coil shaped biopsy marker clip in the upper-outer quadrant of the left breast. There are surrounding malignant type microcalcifications. The previously described  spiculated mass has resolved. There is a biopsy marker clip in the left axilla. The previously seen enlarged lymph node is no longer pathologically enlarged. Targeted ultrasound is performed, showing an echogenic focus in the left breast at 2 o'clock 5 cm from the nipple corresponding with the biopsy marker clip. The previously seen mass in this area is no longer visualized. Sonographic evaluation of the left axilla does not show any enlarged adenopathy. IMPRESSION: Interval resolution of the spiculated mass in  the upper-outer quadrant of the left breast and pathologically enlarged left axillary lymph node. Malignant type microcalcifications and a biopsy marker clip persists in the upper-outer quadrant of the left breast. RECOMMENDATION: Continued treatment planning of the known left breast cancer with metastatic adenopathy is recommended. I have discussed the findings and recommendations with the patient. If applicable, a reminder letter will be sent to the patient regarding the next appointment. BI-RADS CATEGORY  6: Known biopsy-proven malignancy. Electronically Signed   By: Baird Lyons M.D.   On: 05/02/2023 15:15  US Breast Limited Uni Left Inc Axilla  Result Date: 05/02/2023 CLINICAL DATA:  37 year old female with biopsy proven invasive mammary carcinoma in the upper-outer quadrant of the left breast and metastatic axillary adenopathy. EXAM: DIGITAL DIAGNOSTIC UNILATERAL LEFT MAMMOGRAM WITH TOMOSYNTHESIS; ULTRASOUND LEFT BREAST LIMITED TECHNIQUE: Left digital diagnostic mammography and breast tomosynthesis was performed.; Targeted ultrasound examination of the left breast was performed. COMPARISON:  Previous exam(s). ACR Breast Density Category b: There are scattered areas of fibroglandular density. FINDINGS: There is a coil shaped biopsy marker clip in the upper-outer quadrant of the left breast. There are surrounding malignant type microcalcifications. The previously described spiculated mass has resolved. There is a biopsy marker clip in the left axilla. The previously seen enlarged lymph node is no longer pathologically enlarged. Targeted ultrasound is performed, showing an echogenic focus in the left breast at 2 o'clock 5 cm from the nipple corresponding with the biopsy marker clip. The previously seen mass in this area is no longer visualized. Sonographic evaluation of the left axilla does not show any enlarged adenopathy. IMPRESSION: Interval resolution of the spiculated mass in the upper-outer quadrant of  the left breast and pathologically enlarged left axillary lymph node. Malignant type microcalcifications and a biopsy marker clip persists in the upper-outer quadrant of the left breast. RECOMMENDATION: Continued treatment planning of the known left breast cancer with metastatic adenopathy is recommended. I have discussed the findings and recommendations with the patient. If applicable, a reminder letter will be sent to the patient regarding the next appointment. BI-RADS CATEGORY  6: Known biopsy-proven malignancy. Electronically Signed   By: Baird Lyons M.D.   On: 05/02/2023 15:15  CT Chest W Contrast  Result Date: 04/20/2023 CLINICAL DATA:  Breast cancer, bone lesion.  * Tracking Code: BO * EXAM: CT CHEST WITH CONTRAST TECHNIQUE: Multidetector CT imaging of the chest was performed during intravenous contrast administration. RADIATION DOSE REDUCTION: This exam was performed according to the departmental dose-optimization program which includes automated exposure control, adjustment of the mA and/or kV according to patient size and/or use of iterative reconstruction technique. CONTRAST:  75mL OMNIPAQUE IOHEXOL 300 MG/ML  SOLN COMPARISON:  03/01/2023, bone scan 02/21/2023 and CT abdomen 08/29/2007. FINDINGS: Cardiovascular: Right IJ Port-A-Cath terminates in the high right atrium. Borderline enlarged pulmonic trunk. Heart size normal. No pericardial effusion. Mediastinum/Nodes: No pathologically enlarged mediastinal, hilar, axillary or internal mammary lymph nodes. Esophagus is grossly unremarkable. Lungs/Pleura: Lungs are clear. No pleural fluid. Airway is unremarkable. Upper Abdomen:  Visualized portions of the liver, gallbladder, adrenal glands, kidneys, spleen, pancreas, stomach and bowel are grossly unremarkable. No upper abdominal adenopathy. Musculoskeletal: Faint sclerotic lesion in the posterior right ninth rib measures 7 mm (2/94), corresponds to the questioned abnormality on recent bone scan and is  unchanged from 03/01/2023. Finding is new from remote CT abdomen 08/29/2007. Probable bone island in the sternum, not shown to accumulate radiotracer on 02/21/2023. IMPRESSION: 1. Faint sclerotic lesion in the posterior right ninth rib, corresponding to the abnormality on recent bone scan, unchanged from 03/01/2023 and new from 08/29/2007. Findings are worrisome for a metastasis. 2. No additional evidence of residual or metastatic disease. Electronically Signed   By: Leanna Battles M.D.   On: 04/20/2023 09:23

## 2023-05-10 NOTE — Patient Instructions (Signed)
[facility]  Discharge Instructions: Thank you for choosing [facility] to provide your oncology and hematology care.  If you have a lab appointment with the Cancer Center, please go directly to the Cancer Center and check in at the registration area.  Wear comfortable clothing and clothing appropriate for easy access to any Portacath or PICC line.   We strive to give you quality time with your provider. You may need to reschedule your appointment if you arrive late (15 or more minutes).  Arriving late affects you and other patients whose appointments are after yours.  Also, if you miss three or more appointments without notifying the office, you may be dismissed from the clinic at the provider's discretion.      For prescription refill requests, have your pharmacy contact our office and allow 72 hours for refills to be completed.    Today you received the following chemotherapy and/or immunotherapy agents- trastuzumab, pertuzumab, taxotere, carboplatin      To help prevent nausea and vomiting after your treatment, we encourage you to take your nausea medication as directed.  BELOW ARE SYMPTOMS THAT SHOULD BE REPORTED IMMEDIATELY: *FEVER GREATER THAN 100.4 F (38 C) OR HIGHER *CHILLS OR SWEATING *NAUSEA AND VOMITING THAT IS NOT CONTROLLED WITH YOUR NAUSEA MEDICATION *UNUSUAL SHORTNESS OF BREATH *UNUSUAL BRUISING OR BLEEDING *URINARY PROBLEMS (pain or burning when urinating, or frequent urination) *BOWEL PROBLEMS (unusual diarrhea, constipation, pain near the anus) TENDERNESS IN MOUTH AND THROAT WITH OR WITHOUT PRESENCE OF ULCERS (sore throat, sores in mouth, or a toothache) UNUSUAL RASH, SWELLING OR PAIN  UNUSUAL VAGINAL DISCHARGE OR ITCHING   Items with * indicate a potential emergency and should be followed up as soon as possible or go to the Emergency Department if any problems should occur.  Please show the CHEMOTHERAPY ALERT CARD or  IMMUNOTHERAPY ALERT CARD at check-in to the Emergency Department and triage nurse.  Should you have questions after your visit or need to cancel or reschedule your appointment, please contact [place] [facility]  [phone] and follow the prompts.  Office hours are 8:00 a.m. to 4:30 p.m. Monday - Friday. Please note that voicemails left after 4:00 p.m. may not be returned until the following business day.  We are closed weekends and major holidays. You have access to a nurse at all times for urgent questions. Please call the main number to the clinic [phone] and follow the prompts.  For any non-urgent questions, you may also contact your provider using MyChart. We now offer e-Visits for anyone 60 and older to request care online for non-urgent symptoms. For details visit [email or web address].   Also download the MyChart app! Go to the app store, search "MyChart", open the app, select , and log in with your MyChart username and password.

## 2023-05-13 ENCOUNTER — Ambulatory Visit: Payer: Managed Care, Other (non HMO)

## 2023-05-22 ENCOUNTER — Other Ambulatory Visit: Payer: Self-pay | Admitting: Cardiovascular Disease

## 2023-05-23 ENCOUNTER — Other Ambulatory Visit: Payer: Self-pay | Admitting: Internal Medicine

## 2023-05-23 ENCOUNTER — Other Ambulatory Visit: Payer: Managed Care, Other (non HMO)

## 2023-05-24 LAB — LIPID PANEL W/O CHOL/HDL RATIO
Cholesterol, Total: 184 mg/dL (ref 100–199)
HDL: 26 mg/dL — ABNORMAL LOW (ref >39)
LDL Chol Calc (NIH): 108 mg/dL — ABNORMAL HIGH (ref 0–99)
Triglycerides: 287 mg/dL — ABNORMAL HIGH (ref 0–149)
VLDL Cholesterol Cal: 50 mg/dL — ABNORMAL HIGH (ref 5–40)

## 2023-05-25 ENCOUNTER — Ambulatory Visit (INDEPENDENT_AMBULATORY_CARE_PROVIDER_SITE_OTHER): Payer: Managed Care, Other (non HMO) | Admitting: Internal Medicine

## 2023-05-25 ENCOUNTER — Encounter: Payer: Self-pay | Admitting: Internal Medicine

## 2023-05-25 VITALS — BP 146/86 | HR 116 | Ht 67.0 in | Wt 190.0 lb

## 2023-05-25 DIAGNOSIS — E782 Mixed hyperlipidemia: Secondary | ICD-10-CM

## 2023-05-25 DIAGNOSIS — F909 Attention-deficit hyperactivity disorder, unspecified type: Secondary | ICD-10-CM | POA: Diagnosis not present

## 2023-05-25 MED ORDER — AMPHETAMINE-DEXTROAMPHETAMINE 20 MG PO TABS
20.0000 mg | ORAL_TABLET | Freq: Two times a day (BID) | ORAL | 0 refills | Status: DC
Start: 2023-08-22 — End: 2023-05-31

## 2023-05-25 MED ORDER — AMPHETAMINE-DEXTROAMPHETAMINE 20 MG PO TABS
20.0000 mg | ORAL_TABLET | Freq: Two times a day (BID) | ORAL | 0 refills | Status: DC
Start: 2023-06-23 — End: 2023-07-23

## 2023-05-25 MED ORDER — AMPHETAMINE-DEXTROAMPHETAMINE 20 MG PO TABS
20.0000 mg | ORAL_TABLET | Freq: Two times a day (BID) | ORAL | 0 refills | Status: DC
Start: 2023-07-23 — End: 2023-05-31

## 2023-05-25 MED ORDER — AMPHETAMINE-DEXTROAMPHETAMINE 20 MG PO TABS
20.0000 mg | ORAL_TABLET | Freq: Two times a day (BID) | ORAL | 0 refills | Status: DC
Start: 2023-05-25 — End: 2023-07-14

## 2023-05-25 NOTE — Progress Notes (Signed)
Established Patient Office Visit  Subjective:  Patient ID: Cristina Baker, female    DOB: 09-09-1986  Age: 37 y.o. MRN: [id number]  Chief Complaint  Patient presents with   Follow-up    3 month follow up    Presently on chemo for breast Ca and will then be scheduled for a lumpectomy. C/o Chrisa Hassan/e from Chemo. Labs reviewed and notable for normal TC, slightly elevated LDL but high trigs. Pt admits to dietary indiscretion since starting on Chemo due to change in dietary patterns.    No other concerns at this time.   Past Medical History:  Diagnosis Date   Abnormal Pap smear of cervix    Adult ADHD    BRCA negative 2015   MyRisk neg; IBIS=18/8%   Breast cancer (HCC) 01/2023   left   Family history of breast cancer 2015   IBIS=18.8%   Family history of ovarian cancer    Herpes genitalis    Lichen sclerosus    PAC (premature atrial contraction)    PONV (postoperative nausea and vomiting)    nausea   PVC'Adreona Brand (premature ventricular contractions)    Tachycardia, unspecified     Past Surgical History:  Procedure Laterality Date   BREAST BIOPSY Left 02/10/2023   invasive mammary carcinoma   BREAST BIOPSY Left 02/10/2023   [place] Core Hydromark clip path pending   BREAST BIOPSY Left 02/10/2023   [place] LT BREAST BX W LOC DEV 1ST LESION IMG BX SPEC US GUIDE 02/10/2023 [facility]   CESAREAN SECTION  01/03/2007   CESAREAN SECTION WITH BILATERAL TUBAL LIGATION N/A 03/26/2016   Procedure: CESAREAN SECTION WITH BILATERAL TUBAL LIGATION;  Surgeon: Conard Novak, MD;  Location: [facility] ORS;  Service: Obstetrics;  Laterality: N/A;   COLPOSCOPY     IUD REMOVAL     PORTACATH PLACEMENT N/A 02/24/2023   Procedure: INSERTION PORT-A-CATH;  Surgeon: Sung Amabile, DO;  Location: [facility] ORS;  Service: General;  Laterality: N/A;    Social History   Socioeconomic History   Marital status: Married    Spouse name: Verdon Cummins   Number of children: 3   Years of education: Not on file   Highest education  level: Not on file  Occupational History   Not on file  Tobacco Use   Smoking status: Former    Types: Cigarettes   Smokeless tobacco: Never  Vaping Use   Vaping Use: Never used  Substance and Sexual Activity   Alcohol use: Yes    Comment: rarely   Drug use: No   Sexual activity: Yes    Birth control/protection: Surgical    Comment: tubal ligation  Other Topics Concern   Not on file  Social History Narrative   Not on file   Social Determinants of Health   Financial Resource Strain: Low Risk  (04/05/2023)   Overall Financial Resource Strain (CARDIA)    Difficulty of Paying Living Expenses: Not very hard  Food Insecurity: No Food Insecurity (04/05/2023)   Hunger Vital Sign    Worried About Running Out of Food in the Last Year: Never true    Ran Out of Food in the Last Year: Never true  Transportation Needs: No Transportation Needs (02/17/2023)   PRAPARE - Administrator, Civil Service (Medical): No    Lack of Transportation (Non-Medical): No  Physical Activity: Inactive (04/05/2023)   Exercise Vital Sign    Days of Exercise per Week: 0 days    Minutes of Exercise per Session: 0 min  Stress: Stress Concern Present (04/05/2023)   [employer] of Occupational Health - Occupational Stress Questionnaire    Feeling of Stress : To some extent  Social Connections: Socially Integrated (04/05/2023)   Social Connection and Isolation Panel [NHANES]    Frequency of Communication with Friends and Family: Three times a week    Frequency of Social Gatherings with Friends and Family: Once a week    Attends Religious Services: 1 to 4 times per year    Active Member of [employer] or Organizations: Yes    Attends Banker Meetings: 1 to 4 times per year    Marital Status: Married  Catering manager Violence: Not At Risk (02/17/2023)   Humiliation, Afraid, Rape, and Kick questionnaire    Fear of Current or Ex-Partner: No    Emotionally Abused: No    Physically Abused: No     Sexually Abused: No    Family History  Problem Relation Age of Onset   Hypercholesterolemia Mother    Diabetes Mellitus II Father    Hypertension Father    Breast cancer Paternal Aunt 92   Breast cancer Paternal Aunt 71   Breast cancer Paternal Grandmother    Throat cancer Paternal Grandfather    Heart attack Paternal Grandfather     No Known Allergies  Review of Systems  Constitutional: Negative.   HENT: Negative.    Eyes: Negative.   Respiratory: Negative.    Cardiovascular: Negative.   Gastrointestinal: Negative.   Genitourinary: Negative.   Skin: Negative.   Neurological: Negative.   Endo/Heme/Allergies: Negative.        Objective:   BP (!) 146/86   Pulse (!) 116   Ht 5\' 7"  (1.702 m)   Wt 190 lb (86.2 kg)   SpO2 98%   BMI 29.76 kg/m   Vitals:   05/25/23 0947  BP: (!) 146/86  Pulse: (!) 116  Height: 5\' 7"  (1.702 m)  Weight: 190 lb (86.2 kg)  SpO2: 98%  BMI (Calculated): 29.75    Physical Exam Vitals reviewed.  Constitutional:      General: She is not in acute distress. HENT:     Head: Normocephalic.     Nose: Nose normal.     Mouth/Throat:     Mouth: Mucous membranes are moist.  Eyes:     Extraocular Movements: Extraocular movements intact.     Pupils: Pupils are equal, round, and reactive to light.  Cardiovascular:     Rate and Rhythm: Normal rate and regular rhythm.     Heart sounds: No murmur heard. Pulmonary:     Effort: Pulmonary effort is normal.     Breath sounds: No rhonchi or rales.  Abdominal:     General: Abdomen is flat.     Palpations: There is no hepatomegaly, splenomegaly or mass.  Musculoskeletal:        General: Normal range of motion.     Cervical back: Normal range of motion. No tenderness.  Skin:    General: Skin is warm and dry.     Comments: Alopecia  Neurological:     General: No focal deficit present.     Mental Status: She is alert and oriented to person, place, and time.     Cranial Nerves: No cranial  nerve deficit.     Motor: No weakness.  Psychiatric:        Mood and Affect: Mood normal.        Behavior: Behavior normal.      No results  found for any visits on 05/25/23.  Recent Results (from the past 2160 hour(Loeta Herst))  Pregnancy, urine POC     Status: None                   CMP (Cancer Center only)     Status: Abnormal   Collection Time: 03/07/23  9:27 AM  Result Value Ref Range   Sodium 135 135 - 145 mmol/L   Potassium 3.6 3.5 - 5.1 mmol/L   Chloride 106 98 - 111 mmol/L   CO2 23 22 - 32 mmol/L   Glucose, Bld 120 (H) 70 - 99 mg/dL    Comment: Glucose reference range applies only to samples taken after fasting for at least 8 hours.   BUN 19 6 - 20 mg/dL   Creatinine 1.61 0.96 - 1.00 mg/dL   Calcium 9.4 8.9 - 04.5 mg/dL   Total Protein 7.6 6.5 - 8.1 g/dL   Albumin 3.9 3.5 - 5.0 g/dL   AST 25 15 - 41 U/L   ALT 29 0 - 44 U/L   Alkaline Phosphatase 63 38 - 126 U/L   Total Bilirubin 0.5 0.3 - 1.2 mg/dL   GFR, Estimated >40 >98 mL/min    Comment: (NOTE) Calculated using the CKD-EPI Creatinine Equation (2021)    Anion gap 6 5 - 15    Comment: Performed at [facility], [address] [place], [place] [address]  CBC with Differential (Cancer Center Only)     Status: Abnormal   Collection Time: 03/07/23  9:27 AM  Result Value Ref Range   WBC Count 14.9 (H) 4.0 - 10.5 K/uL   RBC 4.29 3.87 - 5.11 MIL/uL   Hemoglobin 12.2 12.0 - 15.0 g/dL   HCT 78.2 95.6 - 21.3 %   MCV 85.3 80.0 - 100.0 fL   MCH 28.4 26.0 - 34.0 pg   MCHC 33.3 30.0 - 36.0 g/dL   RDW 08.6 57.8 - 46.9 %   Platelet Count 439 (H) 150 - 400 K/uL   nRBC 0.0 0.0 - 0.2 %   Neutrophils Relative % 86 %   Neutro Abs 12.9 (H) 1.7 - 7.7 K/uL   Lymphocytes Relative 11 %   Lymphs Abs 1.7 0.7 - 4.0 K/uL   Monocytes Relative 2 %   Monocytes Absolute 0.2 0.1 - 1.0 K/uL   Eosinophils Relative 0 %   Eosinophils Absolute 0.0 0.0 - 0.5 K/uL   Basophils Relative 0 %   Basophils Absolute 0.0 0.0 - 0.1 K/uL    Immature Granulocytes 1 %   Abs Immature Granulocytes 0.13 (H) 0.00 - 0.07 K/uL    Comment: Performed at [facility], [address] [place], [place] [address]  CMP (Cancer Center only)     Status: Abnormal   Collection Time: 03/14/23 10:28 AM  Result Value Ref Range   Sodium 131 (L) 135 - 145 mmol/L   Potassium 3.3 (L) 3.5 - 5.1 mmol/L   Chloride 97 (L) 98 - 111 mmol/L   CO2 25 22 - 32 mmol/L   Glucose, Bld 119 (H) 70 - 99 mg/dL    Comment: Glucose reference range applies only to samples taken after fasting for at least 8 hours.   BUN 20 6 - 20 mg/dL   Creatinine 8.41 3.24 - 1.00 mg/dL   Calcium 8.8 (L) 8.9 - 10.3 mg/dL   Total Protein 7.4 6.5 - 8.1 g/dL   Albumin 3.9 3.5 - 5.0 g/dL   AST 35 15 -  41 U/L   ALT 116 (H) 0 - 44 U/L   Alkaline Phosphatase 71 38 - 126 U/L   Total Bilirubin 0.7 0.3 - 1.2 mg/dL   GFR, Estimated >16 >10 mL/min    Comment: (NOTE) Calculated using the CKD-EPI Creatinine Equation (2021)    Anion gap 9 5 - 15    Comment: Performed at [facility], [address] [place], [place] [address]  Magnesium     Status: None   Collection Time: 03/14/23 10:28 AM  Result Value Ref Range   Magnesium 1.8 1.7 - 2.4 mg/dL    Comment: Performed at [facility], [address] [place], [place] [address]  CBC with Differential (Cancer Center Only)     Status: Abnormal   Collection Time: 03/14/23 10:29 AM  Result Value Ref Range   WBC Count 14.8 (H) 4.0 - 10.5 K/uL   RBC 5.03 3.87 - 5.11 MIL/uL   Hemoglobin 13.9 12.0 - 15.0 g/dL   HCT 19.1 47.8 - 29.5 %   MCV 83.5 80.0 - 100.0 fL   MCH 27.6 26.0 - 34.0 pg   MCHC 33.1 30.0 - 36.0 g/dL   RDW 62.1 30.8 - 65.7 %   Platelet Count 331 150 - 400 K/uL   nRBC 0.2 0.0 - 0.2 %   Neutrophils Relative % 42 %   Neutro Abs 6.3 1.7 - 7.7 K/uL   Lymphocytes Relative 28 %   Lymphs Abs 4.1 (H) 0.7 - 4.0 K/uL   Monocytes Relative 18 %   Monocytes Absolute 2.7 (H) 0.1 - 1.0 K/uL   Eosinophils Relative 0 %    Eosinophils Absolute 0.0 0.0 - 0.5 K/uL   Basophils Relative 0 %   Basophils Absolute 0.0 0.0 - 0.1 K/uL   WBC Morphology      MODERATE LEFT SHIFT (>5% METAS AND MYELOS,OCC PRO NOTED)    Comment: TOXIC GRANULATION DIFF. CONFIRMED BY SMEAR    RBC Morphology MORPHOLOGY UNREMARKABLE    Smear Review Normal platelet morphology     Comment: PLATELETS APPEAR ADEQUATE   Immature Granulocytes 12 %    Comment: Increased IG'Tarissa Kerin, likely caused by Bone Marrow Colony Stimulating Factor received within 30 days. UDENYCA ON 3.27.24    Abs Immature Granulocytes 1.72 (H) 0.00 - 0.07 K/uL    Comment: Performed at [facility], [address] [place], [place] [address]  Magnesium     Status: None   Collection Time: 03/17/23  8:53 AM  Result Value Ref Range   Magnesium 2.1 1.7 - 2.4 mg/dL    Comment: Performed at [facility], [address] [place], [place] [address]  CMP (Cancer Center only)     Status: Abnormal   Collection Time: 03/17/23  8:53 AM  Result Value Ref Range   Sodium 132 (L) 135 - 145 mmol/L   Potassium 3.1 (L) 3.5 - 5.1 mmol/L   Chloride 108 98 - 111 mmol/L   CO2 16 (L) 22 - 32 mmol/L   Glucose, Bld 115 (H) 70 - 99 mg/dL    Comment: Glucose reference range applies only to samples taken after fasting for at least 8 hours.   BUN 17 6 - 20 mg/dL   Creatinine 4.13 2.44 - 1.00 mg/dL   Calcium 9.1 8.9 - 01.0 mg/dL   Total Protein 7.8 6.5 - 8.1 g/dL   Albumin 4.2 3.5 - 5.0 g/dL   AST 272 (H) 15 - 41 U/L   ALT 340 (HH) 0 -  44 U/L    Comment: CRITICAL RESULT CALLED TO, READ BACK BY AND VERIFIED WITH YORK,K AT 0932 ON 4.4.24 BY ISLEY,B REPEATED TO VERIFY    Alkaline Phosphatase 158 (H) 38 - 126 U/L   Total Bilirubin 0.7 0.3 - 1.2 mg/dL   GFR, Estimated >16 >10 mL/min    Comment: (NOTE) Calculated using the CKD-EPI Creatinine Equation (2021)    Anion gap 8 5 - 15    Comment: Performed at [facility], [address] [place], [place] [address]  CBC with  Differential/Platelet     Status: Abnormal   Collection Time: 03/17/23  8:53 AM  Result Value Ref Range   WBC 31.9 (H) 4.0 - 10.5 K/uL   RBC 5.13 (H) 3.87 - 5.11 MIL/uL   Hemoglobin 14.3 12.0 - 15.0 g/dL   HCT 40.9 81.1 - 91.4 %   MCV 84.4 80.0 - 100.0 fL   MCH 27.9 26.0 - 34.0 pg   MCHC 33.0 30.0 - 36.0 g/dL   RDW 78.2 95.6 - 21.3 %   Platelets 343 150 - 400 K/uL   nRBC 0.1 0.0 - 0.2 %   Neutrophils Relative % 59 %   Neutro Abs 18.5 (H) 1.7 - 7.7 K/uL   Lymphocytes Relative 15 %   Lymphs Abs 4.9 (H) 0.7 - 4.0 K/uL   Monocytes Relative 6 %   Monocytes Absolute 2.0 (H) 0.1 - 1.0 K/uL   Eosinophils Relative 0 %   Eosinophils Absolute 0.0 0.0 - 0.5 K/uL   Basophils Relative 0 %   Basophils Absolute 0.0 0.0 - 0.1 K/uL   WBC Morphology Moderate Left Shift (>5% metas and myelos)    RBC Morphology UNREMARKABLE    Smear Review Normal platelet morphology     Comment: PLATELETS APPEAR ADEQUATE   Immature Granulocytes 20 %    Comment: Increased IG'Kailash Hinze, likely caused by Bone Marrow Colony Stimulating Factor received within 30 days.   Abs Immature Granulocytes 6.48 (H) 0.00 - 0.07 K/uL    Comment: Performed at [facility], [address] [place], [place] [address]  Gastrointestinal Panel by PCR , Stool     Status: None   Collection Time: 03/17/23  9:00 AM   Specimen: Stool  Result Value Ref Range   Campylobacter species NOT DETECTED NOT DETECTED   Plesimonas shigelloides NOT DETECTED NOT DETECTED   Salmonella species NOT DETECTED NOT DETECTED   Yersinia enterocolitica NOT DETECTED NOT DETECTED   Vibrio species NOT DETECTED NOT DETECTED   Vibrio cholerae NOT DETECTED NOT DETECTED   Enteroaggregative E coli (EAEC) NOT DETECTED NOT DETECTED   Enteropathogenic E coli (EPEC) NOT DETECTED NOT DETECTED   Enterotoxigenic E coli (ETEC) NOT DETECTED NOT DETECTED   Shiga like toxin producing E coli (STEC) NOT DETECTED NOT DETECTED   Shigella/Enteroinvasive E coli (EIEC) NOT DETECTED NOT  DETECTED   Cryptosporidium NOT DETECTED NOT DETECTED   Cyclospora cayetanensis NOT DETECTED NOT DETECTED   Entamoeba histolytica NOT DETECTED NOT DETECTED   Giardia lamblia NOT DETECTED NOT DETECTED   Adenovirus F40/41 NOT DETECTED NOT DETECTED   Astrovirus NOT DETECTED NOT DETECTED   Norovirus GI/GII NOT DETECTED NOT DETECTED   Rotavirus A NOT DETECTED NOT DETECTED   Sapovirus (I, II, IV, and V) NOT DETECTED NOT DETECTED    Comment: Performed at [facility], [address] [place], [place] [address]  C difficile quick screen w PCR reflex     Status: None   Collection Time: 03/17/23  9:00 AM   Specimen: STOOL  Result Value Ref Range   C Diff antigen NEGATIVE NEGATIVE   C Diff toxin NEGATIVE NEGATIVE   C Diff interpretation No C. difficile detected.     Comment: Performed at [facility], [address], [place], [place] [address]  Result Value Ref Range   Cholesterol, Total 184 100 - 199 mg/dL   Triglycerides 960 (H) 0 - 149 mg/dL   HDL 26 (L) >45 mg/dL   VLDL Cholesterol Cal 50 (H) 5 - 40 mg/dL   LDL Chol Calc (NIH) 409 (H) 0 - 99 mg/dL   LDL CALC COMMENT: CANCELED     Comment: Test not performed  Result canceled by the ancillary.       Assessment & Plan:   As per problem list. Stricter low cholesterol and low fat diet and exercise as much as possible.   Problem List Items Addressed This Visit       Other   Adult ADHD (attention deficit hyperactivity disorder) - Primary   Relevant Medications   amphetamine-dextroamphetamine (ADDERALL) 20 MG tablet   amphetamine-dextroamphetamine (ADDERALL) 20 MG tablet (Start on 06/23/2023)   amphetamine-dextroamphetamine (ADDERALL) 20 MG tablet (Start on 07/23/2023)   amphetamine-dextroamphetamine (ADDERALL) 20 MG tablet (Start on 08/22/2023)   Mixed hyperlipidemia   Relevant Orders   Lipid panel    Return in about 4 months (around 09/24/2023) for fu with labs prior.   Total time spent: 20 minutes  Luna Fuse, MD  05/25/2023   This document may have been prepared by [facility] Voice Recognition software and as such may include unintentional dictation errors.

## 2023-05-30 ENCOUNTER — Ambulatory Visit
Admission: RE | Admit: 2023-05-30 | Discharge: 2023-05-30 | Disposition: A | Discharge status: Home / Self Care | Payer: Managed Care, Other (non HMO) | Source: Ambulatory Visit | Attending: Internal Medicine | Admitting: Internal Medicine

## 2023-05-30 DIAGNOSIS — Z0189 Encounter for other specified special examinations: Secondary | ICD-10-CM | POA: Diagnosis not present

## 2023-05-30 DIAGNOSIS — Z171 Estrogen receptor negative status [ER-]: Secondary | ICD-10-CM | POA: Insufficient documentation

## 2023-05-30 DIAGNOSIS — I493 Ventricular premature depolarization: Secondary | ICD-10-CM | POA: Insufficient documentation

## 2023-05-30 DIAGNOSIS — E785 Hyperlipidemia, unspecified: Secondary | ICD-10-CM | POA: Diagnosis not present

## 2023-05-30 DIAGNOSIS — C50912 Malignant neoplasm of unspecified site of left female breast: Secondary | ICD-10-CM | POA: Diagnosis not present

## 2023-05-30 DIAGNOSIS — Z01818 Encounter for other preprocedural examination: Secondary | ICD-10-CM | POA: Diagnosis not present

## 2023-05-30 DIAGNOSIS — I517 Cardiomegaly: Secondary | ICD-10-CM | POA: Insufficient documentation

## 2023-05-30 LAB — ECHOCARDIOGRAM COMPLETE
AR max vel: 2.56 cm2
AV Area VTI: 2.82 cm2
AV Area mean vel: 2.53 cm2
AV Mean grad: 4 mmHg
AV Peak grad: 7.4 mmHg
Ao pk vel: 1.36 m/s
Area-P 1/2: 3.85 cm2
MV VTI: 3.16 cm2
S' Lateral: 2.9 cm

## 2023-05-30 MED FILL — Dexamethasone Sodium Phosphate Inj 100 MG/10ML: INTRAMUSCULAR | Qty: 1 | Status: AC

## 2023-05-30 MED FILL — Fosaprepitant Dimeglumine For IV Infusion 150 MG (Base Eq): INTRAVENOUS | Qty: 5 | Status: AC

## 2023-05-30 NOTE — Progress Notes (Signed)
*  PRELIMINARY RESULTS* Echocardiogram 2D Echocardiogram has been performed.  Cristina Baker 05/30/2023, 9:56 AM

## 2023-05-31 ENCOUNTER — Encounter: Payer: Self-pay | Admitting: Internal Medicine

## 2023-05-31 ENCOUNTER — Ambulatory Visit
Admission: RE | Admit: 2023-05-31 | Discharge: 2023-05-31 | Disposition: A | Discharge status: Home / Self Care | Payer: Managed Care, Other (non HMO) | Source: Ambulatory Visit | Attending: Internal Medicine | Admitting: Internal Medicine

## 2023-05-31 ENCOUNTER — Inpatient Hospital Stay (HOSPITAL_BASED_OUTPATIENT_CLINIC_OR_DEPARTMENT_OTHER): Payer: Managed Care, Other (non HMO) | Admitting: Internal Medicine

## 2023-05-31 ENCOUNTER — Inpatient Hospital Stay: Payer: Managed Care, Other (non HMO) | Attending: Internal Medicine

## 2023-05-31 ENCOUNTER — Ambulatory Visit
Admission: RE | Admit: 2023-05-31 | Discharge: 2023-05-31 | Disposition: A | Discharge status: Home / Self Care | Payer: Managed Care, Other (non HMO) | Attending: Internal Medicine | Admitting: Internal Medicine

## 2023-05-31 ENCOUNTER — Inpatient Hospital Stay: Payer: Managed Care, Other (non HMO)

## 2023-05-31 ENCOUNTER — Other Ambulatory Visit: Payer: Self-pay

## 2023-05-31 VITALS — BP 134/83 | HR 90

## 2023-05-31 DIAGNOSIS — C50412 Malignant neoplasm of upper-outer quadrant of left female breast: Secondary | ICD-10-CM | POA: Diagnosis present

## 2023-05-31 DIAGNOSIS — R0602 Shortness of breath: Secondary | ICD-10-CM | POA: Insufficient documentation

## 2023-05-31 DIAGNOSIS — D6481 Anemia due to antineoplastic chemotherapy: Secondary | ICD-10-CM | POA: Diagnosis not present

## 2023-05-31 DIAGNOSIS — C50912 Malignant neoplasm of unspecified site of left female breast: Secondary | ICD-10-CM | POA: Diagnosis not present

## 2023-05-31 DIAGNOSIS — Z5111 Encounter for antineoplastic chemotherapy: Secondary | ICD-10-CM | POA: Diagnosis present

## 2023-05-31 DIAGNOSIS — Z171 Estrogen receptor negative status [ER-]: Secondary | ICD-10-CM

## 2023-05-31 DIAGNOSIS — Z79899 Other long term (current) drug therapy: Secondary | ICD-10-CM | POA: Diagnosis not present

## 2023-05-31 DIAGNOSIS — T451X5A Adverse effect of antineoplastic and immunosuppressive drugs, initial encounter: Secondary | ICD-10-CM

## 2023-05-31 LAB — CBC WITH DIFFERENTIAL (CANCER CENTER ONLY)
Abs Immature Granulocytes: 0.14 10*3/uL — ABNORMAL HIGH (ref 0.00–0.07)
Basophils Absolute: 0 10*3/uL (ref 0.0–0.1)
Basophils Relative: 0 %
Eosinophils Absolute: 0 10*3/uL (ref 0.0–0.5)
Eosinophils Relative: 0 %
HCT: 30 % — ABNORMAL LOW (ref 36.0–46.0)
Hemoglobin: 9.8 g/dL — ABNORMAL LOW (ref 12.0–15.0)
Immature Granulocytes: 1 %
Lymphocytes Relative: 17 %
Lymphs Abs: 1.7 10*3/uL (ref 0.7–4.0)
MCH: 30.4 pg (ref 26.0–34.0)
MCHC: 32.7 g/dL (ref 30.0–36.0)
MCV: 93.2 fL (ref 80.0–100.0)
Monocytes Absolute: 0.1 10*3/uL (ref 0.1–1.0)
Monocytes Relative: 1 %
Neutro Abs: 7.8 10*3/uL — ABNORMAL HIGH (ref 1.7–7.7)
Neutrophils Relative %: 81 %
Platelet Count: 200 10*3/uL (ref 150–400)
RBC: 3.22 MIL/uL — ABNORMAL LOW (ref 3.87–5.11)
RDW: 19.1 % — ABNORMAL HIGH (ref 11.5–15.5)
WBC Count: 9.8 10*3/uL (ref 4.0–10.5)
nRBC: 0 % (ref 0.0–0.2)

## 2023-05-31 LAB — CMP (CANCER CENTER ONLY)
ALT: 35 U/L (ref 0–44)
AST: 22 U/L (ref 15–41)
Albumin: 3.7 g/dL (ref 3.5–5.0)
Alkaline Phosphatase: 67 U/L (ref 38–126)
Anion gap: 8 (ref 5–15)
BUN: 19 mg/dL (ref 6–20)
CO2: 23 mmol/L (ref 22–32)
Calcium: 9.6 mg/dL (ref 8.9–10.3)
Chloride: 105 mmol/L (ref 98–111)
Creatinine: 0.6 mg/dL (ref 0.44–1.00)
GFR, Estimated: >60 mL/min (ref >60)
Glucose, Bld: 191 mg/dL — ABNORMAL HIGH (ref 70–99)
Potassium: 4 mmol/L (ref 3.5–5.1)
Sodium: 136 mmol/L (ref 135–145)
Total Bilirubin: 0.6 mg/dL (ref 0.3–1.2)
Total Protein: 6.8 g/dL (ref 6.5–8.1)

## 2023-05-31 LAB — MAGNESIUM: Magnesium: 1.5 mg/dL — ABNORMAL LOW (ref 1.7–2.4)

## 2023-05-31 MED ORDER — SODIUM CHLORIDE 0.9 % IV SOLN
420.0000 mg | Freq: Once | INTRAVENOUS | Status: AC
  Administered 2023-05-31: 420 mg via INTRAVENOUS
  Filled 2023-05-31: qty 14

## 2023-05-31 MED ORDER — TRASTUZUMAB-ANNS CHEMO 150 MG IV SOLR
6.0000 mg/kg | Freq: Once | INTRAVENOUS | Status: AC
  Administered 2023-05-31: 504 mg via INTRAVENOUS
  Filled 2023-05-31: qty 24

## 2023-05-31 MED ORDER — SODIUM CHLORIDE 0.9 % IV SOLN
75.0000 mg/m2 | Freq: Once | INTRAVENOUS | Status: AC
  Administered 2023-05-31: 160 mg via INTRAVENOUS
  Filled 2023-05-31: qty 16

## 2023-05-31 MED ORDER — PALONOSETRON HCL INJECTION 0.25 MG/5ML
0.2500 mg | Freq: Once | INTRAVENOUS | Status: AC
  Administered 2023-05-31: 0.25 mg via INTRAVENOUS
  Filled 2023-05-31: qty 5

## 2023-05-31 MED ORDER — SODIUM CHLORIDE 0.9 % IV SOLN
Freq: Once | INTRAVENOUS | Status: AC
  Filled 2023-05-31: qty 250

## 2023-05-31 MED ORDER — HEPARIN SOD (PORK) LOCK FLUSH 100 UNIT/ML IV SOLN
500.0000 [IU] | Freq: Once | INTRAVENOUS | Status: AC | PRN
  Administered 2023-05-31: 500 [IU]
  Filled 2023-05-31: qty 5

## 2023-05-31 MED ORDER — LEUPROLIDE ACETATE (3 MONTH) 11.25 MG IM KIT
11.2500 mg | PACK | Freq: Once | INTRAMUSCULAR | Status: AC
  Administered 2023-05-31: 11.25 mg via INTRAMUSCULAR
  Filled 2023-05-31: qty 11.25

## 2023-05-31 MED ORDER — SODIUM CHLORIDE 0.9 % IV SOLN
900.0000 mg | Freq: Once | INTRAVENOUS | Status: AC
  Administered 2023-05-31: 900 mg via INTRAVENOUS
  Filled 2023-05-31: qty 90

## 2023-05-31 MED ORDER — SODIUM CHLORIDE 0.9 % IV SOLN
150.0000 mg | Freq: Once | INTRAVENOUS | Status: AC
  Administered 2023-05-31: 150 mg via INTRAVENOUS
  Filled 2023-05-31: qty 150

## 2023-05-31 MED ORDER — SODIUM CHLORIDE 0.9 % IV SOLN
10.0000 mg | Freq: Once | INTRAVENOUS | Status: AC
  Administered 2023-05-31: 10 mg via INTRAVENOUS
  Filled 2023-05-31: qty 10

## 2023-05-31 MED ORDER — MAGNESIUM SULFATE 2 GM/50ML IV SOLN
2.0000 g | Freq: Once | INTRAVENOUS | Status: AC
  Administered 2023-05-31: 2 g via INTRAVENOUS
  Filled 2023-05-31: qty 50

## 2023-05-31 MED ORDER — DIPHENHYDRAMINE HCL 25 MG PO CAPS
50.0000 mg | ORAL_CAPSULE | Freq: Once | ORAL | Status: AC
  Administered 2023-05-31: 50 mg via ORAL
  Filled 2023-05-31: qty 2

## 2023-05-31 MED ORDER — ACETAMINOPHEN 325 MG PO TABS
650.0000 mg | ORAL_TABLET | Freq: Once | ORAL | Status: AC
  Administered 2023-05-31: 650 mg via ORAL
  Filled 2023-05-31: qty 2

## 2023-05-31 NOTE — Progress Notes (Signed)
[facility] CONSULT NOTE  Patient Care Team: Sherron Monday, MD as PCP - General (Internal Medicine) Hulen Luster, RN as Oncology Nurse Navigator  CANCER STAGING   Cancer Staging  Breast cancer [facility]) Staging form: Breast, AJCC 8th Edition - Clinical: Stage IIB (cT2, cN1, cM0, G3, ER-, PR-, HER2+) - Signed by Michaelyn Barter, MD on 02/17/2023 Histologic grading system: 3 grade system   ASSESSMENT & PLAN:  Cristina Baker 37 y.o. 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  # Left breast invasive ductal cancer, ER/PR negative, HER2 positive, stage IIb # Encounter for antineoplastic chemotherapy -Self detected left breast mass.  Status post biopsy on 02/10/2023.  -CT chest abdomen pelvis (03/01/2023)- Ill-defined mixed focus in the posterior right ninth rib corresponding with increased radiotracer uptake on the bone scan.  Nonspecific but suspicious for osseous metastatic disease.  I discussed the finding with Dr. Archer Asa of IR who advised that the lesion is very small in size about 6 mm and is difficult to biopsy. Repeat CT chest on 04/15/2023 showed unchanged right ninth rib lesion.  - Mid treatment mammogram and ultrasound showed complete resolution of the breast mass.   -Labs reviewed and acceptable for treatment.  Will proceed with cycle 5 of TCHP.  She will also get Lupron injection for ovarian hormone suppression.  She is getting Udenyca on day 3 at home.  After 6 cycles, she will be evaluated by Dr. Tonna Boehringer for lumpectomy.  I discussed that depending on what we find during surgery, we will consider 2-week HP for 1 year versus TDM 1 for total 14 cycles.  She is inclining towards lumpectomy and will also make referral to rad Tessa Lerner down the line.  -Follows with Dr. Tonna Boehringer.  # Shortness of breath on exertion -Started after cycle 4.  It is  better but still present and gets out of breath with mild activities. -CXR -walk-in clinic today  # Chemotherapy-induced anemia -Vitamin B12, folate and iron panel normal  # Hypomagnesemia -Mag 1.5.  Continue with mag supplements 400 mg twice daily. -Will add IV mag 2 g today.  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  Closely monitor.  # Family history of breast cancer -Genetic testing was negative  # Anxiety -Xanax 0.25 mg twice daily as needed  # History of palpitations  -Reportedly, she had workup with cardiology which was negative.  It was deemed secondary to panic. -On metoprolol.  # Access-port placed by Dr. Tonna Boehringer.  Has Emla cream  Orders Placed This Encounter  Procedures   DG Chest 2 View    Standing Status:   Future    Standing Expiration Date:   05/30/2024    Order Specific Question:   Reason for Exam (SYMPTOM  OR DIAGNOSIS REQUIRED)    Answer:   shortness of breath    Order Specific Question:   Is patient pregnant?    Answer:   No    Order Specific Question:   Preferred imaging location?    Answer:   [facility]   Magnesium    Standing Status:   Future    Number of Occurrences:   1    Standing Expiration Date:   05/30/2024   RTC in 3 weeks for MD visit, labs, cycle  6 TCHP  The total time spent in the appointment was 30 minutes encounter with patients including review of chart and various tests results, discussions about plan of care and coordination of care plan   All questions were answered. The patient knows to call the clinic with any problems, questions or concerns. No barriers to learning was detected.  Michaelyn Barter, MD 6/18/20249:50 AM   HISTORY OF PRESENTING ILLNESS:  Cristina Baker 37 y.o. female with pmh of anxiety, ADHD and palpitations was referred to medical oncology for management of stage IIb left  breast invasive ductal cancer HER2 positive, hormone negative.  She denies any pain or tenderness in the breast.  Denies any discharge.  There is family history of breast cancer in paternal grandmother and 2 aunts.  She had a BRCA testing about 15 years ago which was negative.  She has completed family-planning.  Underwent tubal ligation.  She has 3 children 1 in 52s second in his teen and the youngest is 39 years old.    Interval history Patient was seen today accompanied by husband prior to cycle 5 of TCHP neoadjuvant therapy.  With cycle #4, she has noticed soreness bilateral thighs as if she has done workout at the gym.  Not taking any pain medication because it is tolerable for her.  Reports only thing she is different is that she has started magnesium supplements.  No more diarrhea.  Has not required any antidiarrheal meds.  Energy level is fair.  Reports shortness of breath on exertion after cycle #4.  Is somewhat better but not completely resolved.  Denies any fever, chills, new cough  I have reviewed her chart and materials related to her cancer extensively and collaborated history with the patient. Summary of oncologic history is as follows: Oncology History  Breast cancer (HCC)  01/26/2023 Initial Diagnosis   Patient felt palpable lump in left breast   02/10/2023 Mammogram     IMPRESSION: 1. There is a suspicious 24 mm mass at the site of palpable concern which is concerning for malignancy. It demonstrates 3 cm of associated calcifications. Recommend ultrasound-guided biopsy for definitive characterization with attention on post marker placement mammogram to assess for extent of calcifications in relation to the biopsy marking clip. 2. There is an indeterminate LEFT axillary lymph node. Recommend ultrasound-guided biopsy for definitive characterization. 3. No mammographic evidence of malignancy in the RIGHT breast.   02/10/2023 Pathology Results   DIAGNOSIS: A.  BREAST, LEFT;  STEREOTACTIC CORE BIOPSY: - INVASIVE MAMMARY CARCINOMA, NO SPECIAL TYPE (DUCTAL CARCINOMA).  Size of invasive carcinoma: 1.7 cm in linear length in this sample Histologic grade of invasive carcinoma: Grade 3 Glandular/tubular differentiation score: 3 Nuclear pleomorphism score: 3 Mitotic rate score: 2 Total score: 8 Ductal carcinoma in situ: Present, high-grade Lymphovascular invasion: Indeterminate See comment.  B.  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   Estrogen Receptor (ER) Status: NEGATIVE (LESS THAN 1%)         Internal control cells positive Progesterone Receptor (PgR) Status: Negative HER2 (by immunohistochemistry): Positive Ki-67: Not performed    02/17/2023 Cancer Staging   Staging form: Breast, AJCC 8th Edition - Clinical: Stage IIB (cT2, cN1, cM0, G3, ER-, PR-, HER2+) - Signed by Michaelyn Barter, MD on 02/17/2023 Histologic grading system: 3 grade system   03/07/2023 -  Chemotherapy   Patient is on Treatment Plan : BREAST  Docetaxel + Carboplatin + Trastuzumab + Pertuzumab  (TCHP) q21d  Genetic Testing   Negative genetic testing. No pathogenic variants identified on the [facility] Multi-Cancer+RNA panel. The report date is 03/01/2023.  The Multi-Cancer + RNA Panel offered by [facility] includes sequencing and/or deletion/duplication analysis of the following 70 genes:  AIP*, ALK, APC*, ATM*, AXIN2*, BAP1*, BARD1*, BLM*, BMPR1A*, BRCA1*, BRCA2*, BRIP1*, CDC73*, CDH1*, CDK4, CDKN1B*, CDKN2A, CHEK2*, CTNNA1*, DICER1*, EPCAM, EGFR, FH*, FLCN*, GREM1, HOXB13, KIT, LZTR1, MAX*, MBD4, MEN1*, MET, MITF, MLH1*, MSH2*, MSH3*, MSH6*, MUTYH*, NF1*, NF2*, NTHL1*, PALB2*, PDGFRA, PMS2*, POLD1*, POLE*, POT1*, PRKAR1A*, PTCH1*, PTEN*, RAD51C*, RAD51D*, RB1*, RET, SDHA*, SDHAF2*, SDHB*, SDHC*, SDHD*, SMAD4*, SMARCA4*, SMARCB1*, SMARCE1*, STK11*, SUFU*, TMEM127*, TP53*, TSC1*, TSC2*, VHL*. RNA analysis is performed for *  genes.     MEDICAL HISTORY:  Past Medical History:  Diagnosis Date   Abnormal Pap smear of cervix    Adult ADHD    BRCA negative 2015   MyRisk neg; IBIS=18/8%   Breast cancer (HCC) 01/2023   left   Family history of breast cancer 2015   IBIS=18.8%   Family history of ovarian cancer    Herpes genitalis    Lichen sclerosus    PAC (premature atrial contraction)    PONV (postoperative nausea and vomiting)    nausea   PVC's (premature ventricular contractions)    Tachycardia, unspecified     SURGICAL HISTORY: Past Surgical History:  Procedure Laterality Date   BREAST BIOPSY Left 02/10/2023   invasive mammary carcinoma   BREAST BIOPSY Left 02/10/2023   [place] Core Hydromark clip path pending   BREAST BIOPSY Left 02/10/2023   [place] LT BREAST BX W LOC DEV 1ST LESION IMG BX SPEC US GUIDE 02/10/2023 [facility]   CESAREAN SECTION  01/03/2007   CESAREAN SECTION WITH BILATERAL TUBAL LIGATION N/A 03/26/2016   Procedure: CESAREAN SECTION WITH BILATERAL TUBAL LIGATION;  Surgeon: Conard Novak, MD;  Location: [facility] ORS;  Service: Obstetrics;  Laterality: N/A;   COLPOSCOPY     IUD REMOVAL     PORTACATH PLACEMENT N/A 02/24/2023   Procedure: INSERTION PORT-A-CATH;  Surgeon: Sung Amabile, DO;  Location: [facility] ORS;  Service: General;  Laterality: N/A;    SOCIAL HISTORY: Social History   Socioeconomic History   Marital status: Married    Spouse name: Verdon Cummins   Number of children: 3   Years of education: Not on file   Highest education level: Not on file  Occupational History   Not on file  Tobacco Use   Smoking status: Former    Types: Cigarettes   Smokeless tobacco: Never  Vaping Use   Vaping Use: Never used  Substance and Sexual Activity   Alcohol use: Yes    Comment: rarely   Drug use: No   Sexual activity: Yes    Birth control/protection: Surgical    Comment: tubal ligation  Other Topics Concern   Not on file  Social History Narrative   Not on file   Social  Determinants of Health   Financial Resource Strain: Low Risk  (04/05/2023)   Overall Financial Resource Strain (CARDIA)    Difficulty of Paying Living Expenses: Not very hard  Food Insecurity: No Food Insecurity (04/05/2023)   Hunger Vital Sign    Worried About Running Out of Food in the Last Year: Never true    Ran Out of Food in the Last Year: Never true  Transportation Needs: No Transportation Needs (02/17/2023)   PRAPARE - Administrator, Civil Service (Medical): No    Lack of Transportation (Non-Medical): No  Physical  Activity: Inactive (04/05/2023)   Exercise Vital Sign    Days of Exercise per Week: 0 days    Minutes of Exercise per Session: 0 min  Stress: Stress Concern Present (04/05/2023)   [employer] of Occupational Health - Occupational Stress Questionnaire    Feeling of Stress : To some extent  Social Connections: Socially Integrated (04/05/2023)   Social Connection and Isolation Panel [NHANES]    Frequency of Communication with Friends and Family: Three times a week    Frequency of Social Gatherings with Friends and Family: Once a week    Attends Religious Services: 1 to 4 times per year    Active Member of [employer] or Organizations: Yes    Attends Banker Meetings: 1 to 4 times per year    Marital Status: Married  Catering manager Violence: Not At Risk (02/17/2023)   Humiliation, Afraid, Rape, and Kick questionnaire    Fear of Current or Ex-Partner: No    Emotionally Abused: No    Physically Abused: No    Sexually Abused: No    FAMILY HISTORY: Family History  Problem Relation Age of Onset   Hypercholesterolemia Mother    Diabetes Mellitus II Father    Hypertension Father    Breast cancer Paternal Aunt 70   Breast cancer Paternal Aunt 37   Breast cancer Paternal Grandmother    Throat cancer Paternal Grandfather    Heart attack Paternal Grandfather     ALLERGIES:  has No Known Allergies.  MEDICATIONS:  Current Outpatient Medications   Medication Sig Dispense Refill   ALPRAZolam (XANAX) 0.5 MG tablet USE AS DIRECTED TAKE HALF TAB TWICE DAILY FOR ANXIETY 15 tablet 2   [START ON 06/23/2023] amphetamine-dextroamphetamine (ADDERALL) 20 MG tablet Take 1 tablet (20 mg total) by mouth 2 (two) times daily. 60 tablet 0   amphetamine-dextroamphetamine (ADDERALL) 20 MG tablet Take 1 tablet (20 mg total) by mouth 2 (two) times daily. 60 tablet 0   clobetasol ointment (TEMOVATE) 0.05 % Apply to affected area twice daily for 2 weeks, then HS for 2 weeks and then M-W-F HS x 2 weeks 30 g 2   dexamethasone (DECADRON) 4 MG tablet Take 2 tabs by mouth 2 times daily starting day before chemo. Then take 2 tabs daily for 2 days starting day after chemo. Take with food. 30 tablet 1   diphenoxylate-atropine (LOMOTIL) 2.5-0.025 MG tablet Take 1-2 tablets by mouth 4 (four) times daily as needed for diarrhea or loose stools. 120 tablet 0   doxycycline (VIBRA-TABS) 100 MG tablet Take 1 tablet (100 mg total) by mouth 2 (two) times daily. 60 tablet 0   escitalopram (LEXAPRO) 10 MG tablet TAKE 1 TABLET BY MOUTH EVERY DAY IN THE MORNING 90 tablet 1   lidocaine-prilocaine (EMLA) cream Apply to affected area once 30 g 3   magic mouthwash w/lidocaine SOLN Take 5ml swish and spit three times daily. 480 mL 3   magnesium oxide (MAG-OX) 400 (240 Mg) MG tablet Take 1 tablet (400 mg total) by mouth 2 (two) times daily. 120 tablet 0   metoprolol succinate (TOPROL-XL) 25 MG 24 hr tablet TAKE 1 TABLET BY MOUTH ONCE A DAY **MAKE FOLLOW UP APPT WITH DR. KHAN FOR REFILLS** 90 tablet 0   Octreotide Acetate 200 MCG/ML SOLN Inject 0.5 mLs (100 mcg total) into the skin every 8 (eight) hours as needed. 15 mL 0   ondansetron (ZOFRAN) 8 MG tablet Take 1 tablet (8 mg total) by mouth every 8 (eight) hours  as needed for nausea or vomiting. Start on the third day after chemotherapy. 30 tablet 1   oxyCODONE (OXY IR/ROXICODONE) 5 MG immediate release tablet Take 0.5-1 tablets (2.5-5 mg  total) by mouth every 6 (six) hours as needed for severe pain. 30 tablet 0   prochlorperazine (COMPAZINE) 10 MG tablet Take 1 tablet (10 mg total) by mouth every 6 (six) hours as needed for nausea or vomiting. 30 tablet 1   SYRINGE-NEEDLE, DISP, 3 ML (B-D 3CC LUER-LOK SYR 25GX5/8") 25G X 5/8" 3 ML MISC use with octreotide 30 each 0   No current facility-administered medications for this visit.   Facility-Administered Medications Ordered in Other Visits  Medication Dose Route Frequency Provider Last Rate Last Admin   heparin lock flush 100 unit/mL  500 Units Intravenous Once Michaelyn Barter, MD        REVIEW OF SYSTEMS:   Pertinent information mentioned in HPI All other systems were reviewed with the patient and are negative.  PHYSICAL EXAMINATION: ECOG PERFORMANCE STATUS: 0 - Asymptomatic  Vitals:   05/31/23 0915  BP: 123/77  Pulse: (!) 128  Temp: 97.8 F (36.6 C)  SpO2: 98%    Filed Weights   05/31/23 0915  Weight: 193 lb 1.6 oz (87.6 kg)     GENERAL:alert, no distress and comfortable SKIN: skin color, texture, turgor are normal, no rashes or significant lesions EYES: normal, conjunctiva are pink and non-injected, sclera clear OROPHARYNX:no exudate, no erythema and lips, buccal mucosa, and tongue normal  NECK: supple, thyroid normal size, non-tender, without nodularity LYMPH:  no palpable lymphadenopathy in the cervical, axillary or inguinal LUNGS: clear to auscultation and percussion with normal breathing effort HEART: regular rate & rhythm and no murmurs and no lower extremity edema ABDOMEN:abdomen soft, non-tender and normal bowel sounds Musculoskeletal:no cyanosis of digits and no clubbing  PSYCH: alert & oriented x 3 with fluent speech NEURO: no focal motor/sensory deficits  LABORATORY DATA:  I have reviewed the data as listed Lab Results  Component Value Date   WBC 9.8 05/31/2023   HGB 9.8 (L) 05/31/2023   HCT 30.0 (L) 05/31/2023   MCV 93.2 05/31/2023    PLT 200 05/31/2023   Recent Labs    04/25/23 1048 05/10/23 0903 05/31/23 0901  NA 133* 139 136  K 3.5 3.5 4.0  CL 101 103 105  CO2 22 23 23   GLUCOSE 108* 131* 191*  BUN 21* 21* 19  CREATININE 0.73 0.64 0.60  CALCIUM 9.3 8.9 9.6  GFRNONAA >60 >60 >60  PROT 7.2 6.4* 6.8  ALBUMIN 4.0 3.5 3.7  AST 23 35 22  ALT 70* 51* 35  ALKPHOS 78 70 67  BILITOT 0.6 0.2* 0.6    RADIOGRAPHIC STUDIES: I have personally reviewed the radiological images as listed and agreed with the findings in the report. ECHOCARDIOGRAM COMPLETE  Result Date: 05/30/2023    ECHOCARDIOGRAM REPORT   Patient Name:   FAVOR MASSOTH Date of Exam: 05/30/2023 Medical Rec #:  [id number]        Height:       67.0 in Accession #:    [id number]       Weight:       190.0 lb Date of Birth:  11/09/1986        BSA:          1.979 m Patient Age:    37 years         BP:  140/86 mmHg Patient Gender: F                HR:           96 bpm. Exam Location:  [facility] Procedure: 2D Echo, Cardiac Doppler, Color Doppler and Strain Analysis Indications:     Encounter for monitoring cardiotoxic drug therapy  History:         Patient has no prior history of Echocardiogram examinations.                  Arrythmias:PVC and PAC; Risk Factors:Dyslipidemia. Breast CA.  Sonographer:     Mikki Harbor Referring Phys:  [id number] Michaelyn Barter Diagnosing Phys: Yvonne Kendall MD  Sonographer Comments: Global longitudinal strain was attempted. IMPRESSIONS  1. Left ventricular ejection fraction, by estimation, is 60 to 65%. The left ventricle has normal function. The left ventricle has no regional wall motion abnormalities. There is mild left ventricular hypertrophy. Left ventricular diastolic parameters were normal.  2. Right ventricular systolic function is normal. The right ventricular size is normal. There is normal pulmonary artery systolic pressure.  3. The mitral valve is normal in structure. Trivial mitral valve regurgitation. No evidence of mitral  stenosis.  4. The aortic valve has an indeterminant number of cusps. Aortic valve regurgitation is not visualized. No aortic stenosis is present. FINDINGS  Left Ventricle: Left ventricular ejection fraction, by estimation, is 60 to 65%. The left ventricle has normal function. The left ventricle has no regional wall motion abnormalities. Global longitudinal strain performed but not reported based on interpreter judgement due to suboptimal tracking. The left ventricular internal cavity size was normal in size. There is mild left ventricular hypertrophy. Left ventricular diastolic parameters were normal. Right Ventricle: The right ventricular size is normal. No increase in right ventricular wall thickness. Right ventricular systolic function is normal. There is normal pulmonary artery systolic pressure. Left Atrium: Left atrial size was normal in size. Right Atrium: Right atrial size was normal in size. Pericardium: There is no evidence of pericardial effusion. Mitral Valve: The mitral valve is normal in structure. Trivial mitral valve regurgitation. No evidence of mitral valve stenosis. MV peak gradient, 4.0 mmHg. The mean mitral valve gradient is 3.0 mmHg. Tricuspid Valve: The tricuspid valve is normal in structure. Tricuspid valve regurgitation is trivial. Aortic Valve: The aortic valve has an indeterminant number of cusps. Aortic valve regurgitation is not visualized. No aortic stenosis is present. Aortic valve mean gradient measures 4.0 mmHg. Aortic valve peak gradient measures 7.4 mmHg. Aortic valve area, by VTI measures 2.82 cm. Pulmonic Valve: The pulmonic valve was normal in structure. Pulmonic valve regurgitation is trivial. No evidence of pulmonic stenosis. Aorta: The aortic root is normal in size and structure. Pulmonary Artery: The pulmonary artery is of normal size. Venous: The inferior vena cava was not well visualized. IAS/Shunts: No atrial level shunt detected by color flow Doppler.  LEFT VENTRICLE  PLAX 2D LVIDd:         4.50 cm   Diastology LVIDs:         2.90 cm   LV e' medial:    9.03 cm/s LV PW:         1.22 cm   LV E/e' medial:  8.1 LV IVS:        1.21 cm   LV e' lateral:   15.10 cm/s LVOT diam:     2.00 cm   LV E/e' lateral: 4.8 LV SV:  66 LV SV Index:   33        2D Longitudinal Strain LVOT Area:     3.14 cm  2D Strain GLS Avg:     -11.9 %  RIGHT VENTRICLE RV Basal diam:  3.00 cm RV Mid diam:    2.10 cm RV S prime:     11.60 cm/s TAPSE (M-mode): 1.8 cm LEFT ATRIUM             Index        RIGHT ATRIUM           Index LA diam:        3.30 cm 1.67 cm/m   RA Area:     10.30 cm LA Vol (A2C):   43.0 ml 21.73 ml/m  RA Volume:   19.70 ml  9.96 ml/m LA Vol (A4C):   32.0 ml 16.17 ml/m LA Biplane Vol: 37.1 ml 18.75 ml/m  AORTIC VALVE                    PULMONIC VALVE AV Area (Vmax):    2.56 cm     PV Vmax:       1.20 m/s AV Area (Vmean):   2.53 cm     PV Peak grad:  5.8 mmHg AV Area (VTI):     2.82 cm AV Vmax:           136.00 cm/s AV Vmean:          89.800 cm/s AV VTI:            0.234 m AV Peak Grad:      7.4 mmHg AV Mean Grad:      4.0 mmHg LVOT Vmax:         111.00 cm/s LVOT Vmean:        72.200 cm/s LVOT VTI:          0.210 m LVOT/AV VTI ratio: 0.90  AORTA Ao Root diam: 3.30 cm MITRAL VALVE               TRICUSPID VALVE MV Area (PHT): 3.85 cm    TR Peak grad:   24.4 mmHg MV Area VTI:   3.16 cm    TR Vmax:        247.00 cm/s MV Peak grad:  4.0 mmHg MV Mean grad:  3.0 mmHg    SHUNTS MV Vmax:       1.00 m/s    Systemic VTI:  0.21 m MV Vmean:      78.1 cm/s   Systemic Diam: 2.00 cm MV Decel Time: 197 msec MV E velocity: 72.80 cm/s MV A velocity: 66.60 cm/s MV E/A ratio:  1.09 Cristal Deer End MD Electronically signed by Yvonne Kendall MD Signature Date/Time: 05/30/2023/3:44:41 PM    Final    MM DIAG BREAST TOMO UNI LEFT  Result Date: 05/02/2023 CLINICAL DATA:  37 year old female with biopsy proven invasive mammary carcinoma in the upper-outer quadrant of the left breast and metastatic  axillary adenopathy. EXAM: DIGITAL DIAGNOSTIC UNILATERAL LEFT MAMMOGRAM WITH TOMOSYNTHESIS; ULTRASOUND LEFT BREAST LIMITED TECHNIQUE: Left digital diagnostic mammography and breast tomosynthesis was performed.; Targeted ultrasound examination of the left breast was performed. COMPARISON:  Previous exam(s). ACR Breast Density Category b: There are scattered areas of fibroglandular density. FINDINGS: There is a coil shaped biopsy marker clip in the upper-outer quadrant of the left breast. There are surrounding malignant type microcalcifications. The previously described spiculated mass has resolved. There is a biopsy marker clip  in the left axilla. The previously seen enlarged lymph node is no longer pathologically enlarged. Targeted ultrasound is performed, showing an echogenic focus in the left breast at 2 o'clock 5 cm from the nipple corresponding with the biopsy marker clip. The previously seen mass in this area is no longer visualized. Sonographic evaluation of the left axilla does not show any enlarged adenopathy. IMPRESSION: Interval resolution of the spiculated mass in the upper-outer quadrant of the left breast and pathologically enlarged left axillary lymph node. Malignant type microcalcifications and a biopsy marker clip persists in the upper-outer quadrant of the left breast. RECOMMENDATION: Continued treatment planning of the known left breast cancer with metastatic adenopathy is recommended. I have discussed the findings and recommendations with the patient. If applicable, a reminder letter will be sent to the patient regarding the next appointment. BI-RADS CATEGORY  6: Known biopsy-proven malignancy. Electronically Signed   By: Baird Lyons M.D.   On: 05/02/2023 15:15  US Breast Limited Uni Left Inc Axilla  Result Date: 05/02/2023 CLINICAL DATA:  37 year old female with biopsy proven invasive mammary carcinoma in the upper-outer quadrant of the left breast and metastatic axillary adenopathy. EXAM:  DIGITAL DIAGNOSTIC UNILATERAL LEFT MAMMOGRAM WITH TOMOSYNTHESIS; ULTRASOUND LEFT BREAST LIMITED TECHNIQUE: Left digital diagnostic mammography and breast tomosynthesis was performed.; Targeted ultrasound examination of the left breast was performed. COMPARISON:  Previous exam(s). ACR Breast Density Category b: There are scattered areas of fibroglandular density. FINDINGS: There is a coil shaped biopsy marker clip in the upper-outer quadrant of the left breast. There are surrounding malignant type microcalcifications. The previously described spiculated mass has resolved. There is a biopsy marker clip in the left axilla. The previously seen enlarged lymph node is no longer pathologically enlarged. Targeted ultrasound is performed, showing an echogenic focus in the left breast at 2 o'clock 5 cm from the nipple corresponding with the biopsy marker clip. The previously seen mass in this area is no longer visualized. Sonographic evaluation of the left axilla does not show any enlarged adenopathy. IMPRESSION: Interval resolution of the spiculated mass in the upper-outer quadrant of the left breast and pathologically enlarged left axillary lymph node. Malignant type microcalcifications and a biopsy marker clip persists in the upper-outer quadrant of the left breast. RECOMMENDATION: Continued treatment planning of the known left breast cancer with metastatic adenopathy is recommended. I have discussed the findings and recommendations with the patient. If applicable, a reminder letter will be sent to the patient regarding the next appointment. BI-RADS CATEGORY  6: Known biopsy-proven malignancy. Electronically Signed   By: Baird Lyons M.D.   On: 05/02/2023 15:15

## 2023-05-31 NOTE — Progress Notes (Signed)
Patient is having more shortness of breath with activity. She also stated how her lower extremities are really sore with some fatigue.

## 2023-05-31 NOTE — Patient Instructions (Signed)
[facility]  Discharge Instructions: Thank you for choosing [facility] to provide your oncology and hematology care.  If you have a lab appointment with the Cancer Center, please go directly to the Cancer Center and check in at the registration area.  Wear comfortable clothing and clothing appropriate for easy access to any Portacath or PICC line.   We strive to give you quality time with your provider. You may need to reschedule your appointment if you arrive late (15 or more minutes).  Arriving late affects you and other patients whose appointments are after yours.  Also, if you miss three or more appointments without notifying the office, you may be dismissed from the clinic at the provider's discretion.      For prescription refill requests, have your pharmacy contact our office and allow 72 hours for refills to be completed.     To help prevent nausea and vomiting after your treatment, we encourage you to take your nausea medication as directed.  BELOW ARE SYMPTOMS THAT SHOULD BE REPORTED IMMEDIATELY: *FEVER GREATER THAN 100.4 F (38 C) OR HIGHER *CHILLS OR SWEATING *NAUSEA AND VOMITING THAT IS NOT CONTROLLED WITH YOUR NAUSEA MEDICATION *UNUSUAL SHORTNESS OF BREATH *UNUSUAL BRUISING OR BLEEDING *URINARY PROBLEMS (pain or burning when urinating, or frequent urination) *BOWEL PROBLEMS (unusual diarrhea, constipation, pain near the anus) TENDERNESS IN MOUTH AND THROAT WITH OR WITHOUT PRESENCE OF ULCERS (sore throat, sores in mouth, or a toothache) UNUSUAL RASH, SWELLING OR PAIN  UNUSUAL VAGINAL DISCHARGE OR ITCHING   Items with * indicate a potential emergency and should be followed up as soon as possible or go to the Emergency Department if any problems should occur.  Please show the CHEMOTHERAPY ALERT CARD or IMMUNOTHERAPY ALERT CARD at check-in to the Emergency Department and triage nurse.  Should you have questions after your visit  or need to cancel or reschedule your appointment, please contact [facility]  [phone] and follow the prompts.  Office hours are 8:00 a.m. to 4:30 p.m. Monday - Friday. Please note that voicemails left after 4:00 p.m. may not be returned until the following business day.  We are closed weekends and major holidays. You have access to a nurse at all times for urgent questions. Please call the main number to the clinic [phone] and follow the prompts.  For any non-urgent questions, you may also contact your provider using MyChart. We now offer e-Visits for anyone 18 and older to request care online for non-urgent symptoms. For details visit [email or web address].   Also download the MyChart app! Go to the app store, search "MyChart", open the app, select [facility], and log in with your MyChart username and password.    

## 2023-06-03 ENCOUNTER — Other Ambulatory Visit: Payer: Self-pay | Admitting: Nurse Practitioner

## 2023-06-03 MED ORDER — OXYCODONE HCL 5 MG PO TABS: 2.5000 mg | ORAL_TABLET | Freq: Four times a day (QID) | ORAL | 0 refills | Status: DC | PRN

## 2023-06-13 ENCOUNTER — Other Ambulatory Visit: Payer: Self-pay | Admitting: Internal Medicine

## 2023-06-13 ENCOUNTER — Other Ambulatory Visit: Payer: Self-pay | Admitting: Nurse Practitioner

## 2023-06-13 DIAGNOSIS — T451X5A Adverse effect of antineoplastic and immunosuppressive drugs, initial encounter: Secondary | ICD-10-CM

## 2023-06-13 DIAGNOSIS — C50912 Malignant neoplasm of unspecified site of left female breast: Secondary | ICD-10-CM

## 2023-06-14 ENCOUNTER — Encounter: Payer: Self-pay | Admitting: Internal Medicine

## 2023-06-16 ENCOUNTER — Other Ambulatory Visit: Payer: Self-pay | Admitting: Nurse Practitioner

## 2023-06-16 DIAGNOSIS — K521 Toxic gastroenteritis and colitis: Secondary | ICD-10-CM

## 2023-06-20 MED FILL — Fosaprepitant Dimeglumine For IV Infusion 150 MG (Base Eq): INTRAVENOUS | Qty: 5 | Status: AC

## 2023-06-21 ENCOUNTER — Inpatient Hospital Stay: Payer: Managed Care, Other (non HMO) | Attending: Internal Medicine

## 2023-06-21 ENCOUNTER — Other Ambulatory Visit: Payer: Self-pay | Admitting: *Deleted

## 2023-06-21 ENCOUNTER — Inpatient Hospital Stay (HOSPITAL_BASED_OUTPATIENT_CLINIC_OR_DEPARTMENT_OTHER): Payer: Managed Care, Other (non HMO) | Attending: Internal Medicine | Admitting: Internal Medicine

## 2023-06-21 ENCOUNTER — Encounter: Payer: Self-pay | Admitting: Internal Medicine

## 2023-06-21 ENCOUNTER — Inpatient Hospital Stay: Payer: Managed Care, Other (non HMO)

## 2023-06-21 ENCOUNTER — Encounter: Payer: Self-pay | Admitting: *Deleted

## 2023-06-21 VITALS — BP 121/88 | HR 107 | Temp 98.6°F | Wt 196.4 lb

## 2023-06-21 VITALS — HR 98

## 2023-06-21 DIAGNOSIS — Z5112 Encounter for antineoplastic immunotherapy: Secondary | ICD-10-CM | POA: Insufficient documentation

## 2023-06-21 DIAGNOSIS — C50912 Malignant neoplasm of unspecified site of left female breast: Secondary | ICD-10-CM

## 2023-06-21 DIAGNOSIS — Z5111 Encounter for antineoplastic chemotherapy: Secondary | ICD-10-CM | POA: Insufficient documentation

## 2023-06-21 DIAGNOSIS — C50412 Malignant neoplasm of upper-outer quadrant of left female breast: Secondary | ICD-10-CM | POA: Diagnosis present

## 2023-06-21 DIAGNOSIS — T451X5A Adverse effect of antineoplastic and immunosuppressive drugs, initial encounter: Secondary | ICD-10-CM

## 2023-06-21 DIAGNOSIS — Z79899 Other long term (current) drug therapy: Secondary | ICD-10-CM | POA: Diagnosis not present

## 2023-06-21 DIAGNOSIS — K521 Toxic gastroenteritis and colitis: Secondary | ICD-10-CM

## 2023-06-21 DIAGNOSIS — G620 Drug-induced polyneuropathy: Secondary | ICD-10-CM

## 2023-06-21 DIAGNOSIS — Z171 Estrogen receptor negative status [ER-]: Secondary | ICD-10-CM

## 2023-06-21 DIAGNOSIS — D6481 Anemia due to antineoplastic chemotherapy: Secondary | ICD-10-CM

## 2023-06-21 LAB — CMP (CANCER CENTER ONLY)
ALT: 37 U/L (ref 0–44)
AST: 24 U/L (ref 15–41)
Albumin: 3.5 g/dL (ref 3.5–5.0)
Alkaline Phosphatase: 62 U/L (ref 38–126)
Anion gap: 8 (ref 5–15)
BUN: 22 mg/dL — ABNORMAL HIGH (ref 6–20)
CO2: 23 mmol/L (ref 22–32)
Calcium: 9.2 mg/dL (ref 8.9–10.3)
Chloride: 106 mmol/L (ref 98–111)
Creatinine: 0.6 mg/dL (ref 0.44–1.00)
GFR, Estimated: >60 mL/min (ref >60)
Glucose, Bld: 146 mg/dL — ABNORMAL HIGH (ref 70–99)
Potassium: 3.5 mmol/L (ref 3.5–5.1)
Sodium: 137 mmol/L (ref 135–145)
Total Bilirubin: 0.4 mg/dL (ref 0.3–1.2)
Total Protein: 6.3 g/dL — ABNORMAL LOW (ref 6.5–8.1)

## 2023-06-21 LAB — CBC WITH DIFFERENTIAL (CANCER CENTER ONLY)
Abs Immature Granulocytes: 0.13 10*3/uL — ABNORMAL HIGH (ref 0.00–0.07)
Basophils Absolute: 0 10*3/uL (ref 0.0–0.1)
Basophils Relative: 0 %
Eosinophils Absolute: 0 10*3/uL (ref 0.0–0.5)
Eosinophils Relative: 0 %
HCT: 28.4 % — ABNORMAL LOW (ref 36.0–46.0)
Hemoglobin: 9.1 g/dL — ABNORMAL LOW (ref 12.0–15.0)
Immature Granulocytes: 1 %
Lymphocytes Relative: 16 %
Lymphs Abs: 1.7 10*3/uL (ref 0.7–4.0)
MCH: 31 pg (ref 26.0–34.0)
MCHC: 32 g/dL (ref 30.0–36.0)
MCV: 96.6 fL (ref 80.0–100.0)
Monocytes Absolute: 0.6 10*3/uL (ref 0.1–1.0)
Monocytes Relative: 6 %
Neutro Abs: 8.4 10*3/uL — ABNORMAL HIGH (ref 1.7–7.7)
Neutrophils Relative %: 77 %
Platelet Count: 184 10*3/uL (ref 150–400)
RBC: 2.94 MIL/uL — ABNORMAL LOW (ref 3.87–5.11)
RDW: 17.8 % — ABNORMAL HIGH (ref 11.5–15.5)
WBC Count: 10.9 10*3/uL — ABNORMAL HIGH (ref 4.0–10.5)
nRBC: 0 % (ref 0.0–0.2)

## 2023-06-21 LAB — MAGNESIUM: Magnesium: 1.1 mg/dL — ABNORMAL LOW (ref 1.7–2.4)

## 2023-06-21 MED ORDER — ACETAMINOPHEN 325 MG PO TABS
650.0000 mg | ORAL_TABLET | Freq: Once | ORAL | Status: AC
  Administered 2023-06-21: 650 mg via ORAL
  Filled 2023-06-21: qty 2

## 2023-06-21 MED ORDER — DIPHENOXYLATE-ATROPINE 2.5-0.025 MG PO TABS
1.0000 | ORAL_TABLET | Freq: Four times a day (QID) | ORAL | 0 refills | Status: DC | PRN
Start: 2023-06-21 — End: 2023-07-14

## 2023-06-21 MED ORDER — SODIUM CHLORIDE 0.9 % IV SOLN
10.0000 mg | Freq: Once | INTRAVENOUS | Status: AC
  Administered 2023-06-21: 10 mg via INTRAVENOUS
  Filled 2023-06-21: qty 10

## 2023-06-21 MED ORDER — SODIUM CHLORIDE 0.9 % IV SOLN
420.0000 mg | Freq: Once | INTRAVENOUS | Status: AC
  Administered 2023-06-21: 420 mg via INTRAVENOUS
  Filled 2023-06-21: qty 14

## 2023-06-21 MED ORDER — MAGNESIUM SULFATE 4 GM/100ML IV SOLN
4.0000 g | Freq: Once | INTRAVENOUS | Status: AC
  Administered 2023-06-21: 4 g via INTRAVENOUS
  Filled 2023-06-21: qty 100

## 2023-06-21 MED ORDER — DIPHENHYDRAMINE HCL 25 MG PO CAPS
50.0000 mg | ORAL_CAPSULE | Freq: Once | ORAL | Status: AC
  Administered 2023-06-21: 50 mg via ORAL
  Filled 2023-06-21: qty 2

## 2023-06-21 MED ORDER — TRASTUZUMAB-ANNS CHEMO 150 MG IV SOLR
6.0000 mg/kg | Freq: Once | INTRAVENOUS | Status: AC
  Administered 2023-06-21: 504 mg via INTRAVENOUS
  Filled 2023-06-21: qty 24

## 2023-06-21 MED ORDER — SODIUM CHLORIDE 0.9 % IV SOLN
150.0000 mg | Freq: Once | INTRAVENOUS | Status: AC
  Administered 2023-06-21: 150 mg via INTRAVENOUS
  Filled 2023-06-21: qty 150

## 2023-06-21 MED ORDER — SODIUM CHLORIDE 0.9 % IV SOLN
900.0000 mg | Freq: Once | INTRAVENOUS | Status: AC
  Administered 2023-06-21: 900 mg via INTRAVENOUS
  Filled 2023-06-21: qty 90

## 2023-06-21 MED ORDER — PALONOSETRON HCL INJECTION 0.25 MG/5ML
0.2500 mg | Freq: Once | INTRAVENOUS | Status: AC
  Administered 2023-06-21: 0.25 mg via INTRAVENOUS
  Filled 2023-06-21: qty 5

## 2023-06-21 MED ORDER — SODIUM CHLORIDE 0.9% FLUSH
10.0000 mL | INTRAVENOUS | Status: DC | PRN
  Filled 2023-06-21: qty 10

## 2023-06-21 MED ORDER — SODIUM CHLORIDE 0.9 % IV SOLN
75.0000 mg/m2 | Freq: Once | INTRAVENOUS | Status: AC
  Administered 2023-06-21: 160 mg via INTRAVENOUS
  Filled 2023-06-21: qty 16

## 2023-06-21 MED ORDER — HEPARIN SOD (PORK) LOCK FLUSH 100 UNIT/ML IV SOLN
500.0000 [IU] | Freq: Once | INTRAVENOUS | Status: AC | PRN
  Administered 2023-06-21: 500 [IU]
  Filled 2023-06-21: qty 5

## 2023-06-21 MED ORDER — SODIUM CHLORIDE 0.9 % IV SOLN
Freq: Once | INTRAVENOUS | Status: AC
  Filled 2023-06-21: qty 250

## 2023-06-21 NOTE — Telephone Encounter (Signed)
Pt requesting lomotil refill.

## 2023-06-21 NOTE — Progress Notes (Signed)
Patient says that she is feeling pretty good, her fatigue has got better. She did say that she needs a refill of her Lomotil.

## 2023-06-21 NOTE — Progress Notes (Signed)
[facility] CONSULT NOTE  Patient Care Team: Sherron Monday, MD as PCP - General (Internal Medicine) Hulen Luster, RN as Oncology Nurse Navigator  CANCER STAGING   Cancer Staging  Breast cancer [facility]) Staging form: Breast, AJCC 8th Edition - Clinical: Stage IIB (cT2, cN1, cM0, G3, ER-, PR-, HER2+) - Signed by Michaelyn Barter, MD on 02/17/2023 Histologic grading system: 3 grade system   ASSESSMENT & PLAN:  Cristina Baker 37 y.o. 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  # Left breast invasive ductal cancer, ER/PR negative, HER2 positive, stage IIb # Encounter for antineoplastic chemotherapy -Self detected left breast mass.  Status post biopsy on 02/10/2023.  -CT chest abdomen pelvis (03/01/2023)- Ill-defined mixed focus in the posterior right ninth rib corresponding with increased radiotracer uptake on the bone scan.  Nonspecific but suspicious for osseous metastatic disease.  I discussed the finding with Dr. Archer Asa of IR who advised that the lesion is very small in size about 6 mm and is difficult to biopsy. Repeat CT chest on 04/15/2023 showed unchanged right ninth rib lesion.  - Mid treatment mammogram and ultrasound showed complete resolution of the breast mass.   -Labs reviewed and acceptable for treatment.  Will proceed with cycle 6 of TCHP which is the last round of neoadjuvant treatment.  Udenyca on day 3 at home.  I have sent a message to Dr. Tonna Boehringer to follow-up regarding surgical options.  I will see her back 2 to 3 weeks after surgery to discuss path report and next steps.  Patient is inclining towards lumpectomy.  Will make referral to radiation oncology then.  She will come for labs in 3 weeks to ensure her counts and mag has recovered prior to surgery.  # Right ninth rib lesion -Questionable.  Last CT in May 2024 showed  stable 7 mm lesion.  Case was previously discussed with Dr. Ruthe Mannan from IR and lesion was too small to biopsy. -I will repeat CT in August 2024 to closely monitor the rib lesion.  However that would not change our management to proceed with the surgery.  # Chemo induced diarrhea -Refilled Lomotil.  Continue with Imodium and octreotide as needed.  # Chemotherapy-induced anemia -Vitamin B12, folate and iron panel normal  # Hypomagnesemia -Likely related to GI loss from diarrhea -Mag 1.1.  Advised the patient to increase mag oxide from 400 twice daily to 800 twice daily for 3 weeks and then go down to 400 twice daily. -Will add IV mag 4 g today.  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  Closely monitor.  # Family history of breast cancer -Genetic testing was negative  # Anxiety -Xanax 0.25 mg twice daily as needed  # History of palpitations  -Reportedly, she had workup with cardiology which was negative.  It was deemed secondary to panic. -On metoprolol.  # Access-port placed by Dr. Tonna Boehringer.  Has Emla cream  Orders Placed This Encounter  Procedures   CT Chest W Contrast    Standing Status:   Future    Standing Expiration Date:   06/20/2024    Order Specific Question:   If indicated for the ordered procedure, I authorize the administration of contrast media per Radiology protocol    Answer:   Yes    Order Specific  Question:   Does the patient have a contrast media/X-ray dye allergy?    Answer:   No    Order Specific Question:   Is patient pregnant?    Answer:   No    Order Specific Question:   Preferred imaging location?    Answer:   [facility]   CBC with Differential/Platelet    Standing Status:   Future    Standing Expiration Date:   06/20/2024   Comprehensive metabolic panel    Standing Status:   Future    Standing Expiration Date:   06/20/2024   Magnesium    Standing Status:    Future    Standing Expiration Date:   06/20/2024   RTC in 3 weeks for MD visit, labs, cycle 6 TCHP  The total time spent in the appointment was 30 minutes encounter with patients including review of chart and various tests results, discussions about plan of care and coordination of care plan   All questions were answered. The patient knows to call the clinic with any problems, questions or concerns. No barriers to learning was detected.  Michaelyn Barter, MD 7/9/202412:18 PM   HISTORY OF PRESENTING ILLNESS:  Cristina Baker 37 y.o. 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  She denies any pain or tenderness in the breast.  Denies any discharge.  There is family history of breast cancer in paternal grandmother and 2 aunts.  She had a BRCA testing about 15 years ago which was negative.  She has completed family-planning.  Underwent tubal ligation.  She has 3 children 1 in 77s second in his teen and the youngest is 39 years old.    Interval history Patient was seen today prior to cycle 6 of TCHP neoadjuvant therapy.  Reports diarrhea for 5 days after chemotherapy.  Used Lomotil and octreotide injection.  Fatigue present.  Nausea which is manageable with antiemetics.  No vomiting.  Has numbness in bilateral feet when she stands long.  Denies any neuropathy in hands.  I have reviewed her chart and materials related to her cancer extensively and collaborated history with the patient. Summary of oncologic history is as follows: Oncology History  Breast cancer (HCC)  01/26/2023 Initial Diagnosis   Patient felt palpable lump in left breast   02/10/2023 Mammogram     IMPRESSION: 1. There is a suspicious 24 mm mass at the site of palpable concern which is concerning for malignancy. It demonstrates 3 cm of associated calcifications. Recommend ultrasound-guided biopsy  for definitive characterization with attention on post marker placement mammogram to assess for extent of calcifications in relation to the biopsy marking clip. 2. There is an indeterminate LEFT axillary lymph node. Recommend ultrasound-guided biopsy for definitive characterization. 3. No mammographic evidence of malignancy in the RIGHT breast.   02/10/2023 Pathology Results   DIAGNOSIS: A.  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  Size of invasive carcinoma: 1.7 cm in linear length in this sample Histologic grade of invasive carcinoma: Grade 3 Glandular/tubular differentiation score: 3 Nuclear pleomorphism score: 3 Mitotic rate score: 2 Total score: 8 Ductal carcinoma in situ: Present, high-grade Lymphovascular invasion: Indeterminate See comment.  B.  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   Estrogen Receptor (ER) Status: NEGATIVE (LESS THAN 1%)  Internal control cells positive Progesterone Receptor (PgR) Status: Negative HER2 (by immunohistochemistry): Positive Ki-67: Not performed    02/17/2023 Cancer Staging   Staging form: Breast, AJCC 8th Edition - Clinical: Stage IIB (cT2, cN1, cM0, G3, ER-, PR-, HER2+) - Signed by Michaelyn Barter, MD on 02/17/2023 Histologic grading system: 3 grade system   03/07/2023 -  Chemotherapy   Patient is on Treatment Plan : BREAST  Docetaxel + Carboplatin + Trastuzumab + Pertuzumab  (TCHP) q21d       Genetic Testing   Negative genetic testing. No pathogenic variants identified on the [facility] Multi-Cancer+RNA panel. The report date is 03/01/2023.  The Multi-Cancer + RNA Panel offered by [facility] includes sequencing and/or deletion/duplication analysis of the following 70 genes:  AIP*, ALK, APC*, ATM*, AXIN2*, BAP1*, BARD1*, BLM*, BMPR1A*, BRCA1*, BRCA2*, BRIP1*, CDC73*, CDH1*, CDK4, CDKN1B*, CDKN2A, CHEK2*,  CTNNA1*, DICER1*, EPCAM, EGFR, FH*, FLCN*, GREM1, HOXB13, KIT, LZTR1, MAX*, MBD4, MEN1*, MET, MITF, MLH1*, MSH2*, MSH3*, MSH6*, MUTYH*, NF1*, NF2*, NTHL1*, PALB2*, PDGFRA, PMS2*, POLD1*, POLE*, POT1*, PRKAR1A*, PTCH1*, PTEN*, RAD51C*, RAD51D*, RB1*, RET, SDHA*, SDHAF2*, SDHB*, SDHC*, SDHD*, SMAD4*, SMARCA4*, SMARCB1*, SMARCE1*, STK11*, SUFU*, TMEM127*, TP53*, TSC1*, TSC2*, VHL*. RNA analysis is performed for * genes.     MEDICAL HISTORY:  Past Medical History:  Diagnosis Date   Abnormal Pap smear of cervix    Adult ADHD    BRCA negative 2015   MyRisk neg; IBIS=18/8%   Breast cancer (HCC) 01/2023   left   Family history of breast cancer 2015   IBIS=18.8%   Family history of ovarian cancer    Herpes genitalis    Lichen sclerosus    PAC (premature atrial contraction)    PONV (postoperative nausea and vomiting)    nausea   PVC's (premature ventricular contractions)    Tachycardia, unspecified     SURGICAL HISTORY: Past Surgical History:  Procedure Laterality Date   BREAST BIOPSY Left 02/10/2023   invasive mammary carcinoma   BREAST BIOPSY Left 02/10/2023   [place] Core Hydromark clip path pending   BREAST BIOPSY Left 02/10/2023   [place] LT BREAST BX W LOC DEV 1ST LESION IMG BX SPEC US GUIDE 02/10/2023 [facility]   CESAREAN SECTION  01/03/2007   CESAREAN SECTION WITH BILATERAL TUBAL LIGATION N/A 03/26/2016   Procedure: CESAREAN SECTION WITH BILATERAL TUBAL LIGATION;  Surgeon: Conard Novak, MD;  Location: [facility] ORS;  Service: Obstetrics;  Laterality: N/A;   COLPOSCOPY     IUD REMOVAL     PORTACATH PLACEMENT N/A 02/24/2023   Procedure: INSERTION PORT-A-CATH;  Surgeon: Sung Amabile, DO;  Location: [facility] ORS;  Service: General;  Laterality: N/A;    SOCIAL HISTORY: Social History   Socioeconomic History   Marital status: Married    Spouse name: Verdon Cummins   Number of children: 3   Years of education: Not on file   Highest education level: Not on file  Occupational History   Not  on file  Tobacco Use   Smoking status: Former    Types: Cigarettes   Smokeless tobacco: Never  Vaping Use   Vaping Use: Never used  Substance and Sexual Activity   Alcohol use: Yes    Comment: rarely   Drug use: No   Sexual activity: Yes    Birth control/protection: Surgical    Comment: tubal ligation  Other Topics Concern   Not on file  Social History Narrative   Not on file   Social Determinants of Health   Financial Resource Strain: Low Risk  (04/05/2023)  Overall Financial Resource Strain (CARDIA)    Difficulty of Paying Living Expenses: Not very hard  Food Insecurity: No Food Insecurity (04/05/2023)   Hunger Vital Sign    Worried About Running Out of Food in the Last Year: Never true    Ran Out of Food in the Last Year: Never true  Transportation Needs: No Transportation Needs (02/17/2023)   PRAPARE - Administrator, Civil Service (Medical): No    Lack of Transportation (Non-Medical): No  Physical Activity: Inactive (04/05/2023)   Exercise Vital Sign    Days of Exercise per Week: 0 days    Minutes of Exercise per Session: 0 min  Stress: Stress Concern Present (04/05/2023)   [employer] of Occupational Health - Occupational Stress Questionnaire    Feeling of Stress : To some extent  Social Connections: Socially Integrated (04/05/2023)   Social Connection and Isolation Panel [NHANES]    Frequency of Communication with Friends and Family: Three times a week    Frequency of Social Gatherings with Friends and Family: Once a week    Attends Religious Services: 1 to 4 times per year    Active Member of [employer] or Organizations: Yes    Attends Banker Meetings: 1 to 4 times per year    Marital Status: Married  Catering manager Violence: Not At Risk (02/17/2023)   Humiliation, Afraid, Rape, and Kick questionnaire    Fear of Current or Ex-Partner: No    Emotionally Abused: No    Physically Abused: No    Sexually Abused: No    FAMILY  HISTORY: Family History  Problem Relation Age of Onset   Hypercholesterolemia Mother    Diabetes Mellitus II Father    Hypertension Father    Breast cancer Paternal Aunt 71   Breast cancer Paternal Aunt 80   Breast cancer Paternal Grandmother    Throat cancer Paternal Grandfather    Heart attack Paternal Grandfather     ALLERGIES:  has No Known Allergies.  MEDICATIONS:  Current Outpatient Medications  Medication Sig Dispense Refill   ALPRAZolam (XANAX) 0.5 MG tablet USE AS DIRECTED TAKE HALF TAB TWICE DAILY FOR ANXIETY 15 tablet 2   amphetamine-dextroamphetamine (ADDERALL) 20 MG tablet Take 1 tablet (20 mg total) by mouth 2 (two) times daily. 60 tablet 0   [START ON 06/23/2023] amphetamine-dextroamphetamine (ADDERALL) 20 MG tablet Take 1 tablet (20 mg total) by mouth 2 (two) times daily. 60 tablet 0   clobetasol ointment (TEMOVATE) 0.05 % Apply to affected area twice daily for 2 weeks, then HS for 2 weeks and then M-W-F HS x 2 weeks 30 g 2   dexamethasone (DECADRON) 4 MG tablet TAKE 2 TABS BY MOUTH 2 TIMES DAILY STARTING DAY BEFORE CHEMO. THEN TAKE 2 TABS DAILY FOR 2 DAYS STARTING DAY AFTER CHEMO. TAKE WITH FOOD. 30 tablet 1   doxycycline (VIBRA-TABS) 100 MG tablet Take 1 tablet (100 mg total) by mouth 2 (two) times daily. 60 tablet 0   escitalopram (LEXAPRO) 10 MG tablet TAKE 1 TABLET BY MOUTH EVERY DAY IN THE MORNING 90 tablet 1   lidocaine-prilocaine (EMLA) cream Apply to affected area once 30 g 3   magic mouthwash w/lidocaine SOLN Take 5ml swish and spit three times daily. 480 mL 3   magnesium oxide (MAG-OX) 400 (240 Mg) MG tablet Take 1 tablet (400 mg total) by mouth 2 (two) times daily. 120 tablet 0   metoprolol succinate (TOPROL-XL) 25 MG 24 hr tablet TAKE 1 TABLET BY  MOUTH ONCE A DAY **MAKE FOLLOW UP APPT WITH DR. KHAN FOR REFILLS** 90 tablet 0   Octreotide Acetate 200 MCG/ML SOLN Inject 0.5 mLs (100 mcg total) into the skin every 8 (eight) hours as needed. 15 mL 0   ondansetron  (ZOFRAN) 8 MG tablet TAKE 1 TABLET (8 MG TOTAL) BY MOUTH EVERY 8 (EIGHT) HOURS AS NEEDED FOR NAUSEA OR VOMITING. START ON THE THIRD DAY AFTER CHEMOTHERAPY. 30 tablet 1   oxyCODONE (OXY IR/ROXICODONE) 5 MG immediate release tablet Take 0.5-1 tablets (2.5-5 mg total) by mouth every 6 (six) hours as needed for severe pain. 30 tablet 0   prochlorperazine (COMPAZINE) 10 MG tablet Take 1 tablet (10 mg total) by mouth every 6 (six) hours as needed for nausea or vomiting. 30 tablet 1   SYRINGE-NEEDLE, DISP, 3 ML (B-D 3CC LUER-LOK SYR 25GX5/8") 25G X 5/8" 3 ML MISC use with octreotide 30 each 0   diphenoxylate-atropine (LOMOTIL) 2.5-0.025 MG tablet Take 1-2 tablets by mouth 4 (four) times daily as needed for diarrhea or loose stools. 30 tablet 0   No current facility-administered medications for this visit.   Facility-Administered Medications Ordered in Other Visits  Medication Dose Route Frequency Provider Last Rate Last Admin   CARBOplatin (PARAPLATIN) 900 mg in sodium chloride 0.9 % 500 mL chemo infusion  900 mg Intravenous Once Michaelyn Barter, MD       DOCEtaxel (TAXOTERE) 160 mg in sodium chloride 0.9 % 250 mL chemo infusion  75 mg/m2 (Treatment Plan Recorded) Intravenous Once Michaelyn Barter, MD       heparin lock flush 100 unit/mL  500 Units Intravenous Once Michaelyn Barter, MD       heparin lock flush 100 unit/mL  500 Units Intracatheter Once PRN Michaelyn Barter, MD       magnesium sulfate IVPB 4 g 100 mL  4 g Intravenous Once Michaelyn Barter, MD 50 mL/hr at 06/21/23 1203 4 g at 06/21/23 1203   sodium chloride flush (NS) 0.9 % injection 10 mL  10 mL Intracatheter PRN Michaelyn Barter, MD        REVIEW OF SYSTEMS:   Pertinent information mentioned in HPI All other systems were reviewed with the patient and are negative.  PHYSICAL EXAMINATION: ECOG PERFORMANCE STATUS: 0 - Asymptomatic  Vitals:   06/21/23 0852  BP: 121/88  Pulse: (!) 107  Temp: 98.6 F (37 C)  SpO2: 97%    Filed Weights    06/21/23 0852  Weight: 196 lb 6.4 oz (89.1 kg)     GENERAL:alert, no distress and comfortable SKIN: skin color, texture, turgor are normal, no rashes or significant lesions EYES: normal, conjunctiva are pink and non-injected, sclera clear OROPHARYNX:no exudate, no erythema and lips, buccal mucosa, and tongue normal  NECK: supple, thyroid normal size, non-tender, without nodularity LYMPH:  no palpable lymphadenopathy in the cervical, axillary or inguinal LUNGS: clear to auscultation and percussion with normal breathing effort HEART: regular rate & rhythm and no murmurs and no lower extremity edema ABDOMEN:abdomen soft, non-tender and normal bowel sounds Musculoskeletal:no cyanosis of digits and no clubbing  PSYCH: alert & oriented x 3 with fluent speech NEURO: no focal motor/sensory deficits  LABORATORY DATA:  I have reviewed the data as listed Lab Results  Component Value Date   WBC 10.9 (H) 06/21/2023   HGB 9.1 (L) 06/21/2023   HCT 28.4 (L) 06/21/2023   MCV 96.6 06/21/2023   PLT 184 06/21/2023   Recent Labs    05/10/23 0903 05/31/23 0901 06/21/23  0826  NA 139 136 137  K 3.5 4.0 3.5  CL 103 105 106  CO2 23 23 23   GLUCOSE 131* 191* 146*  BUN 21* 19 22*  CREATININE 0.64 0.60 0.60  CALCIUM 8.9 9.6 9.2  GFRNONAA >60 >60 >60  PROT 6.4* 6.8 6.3*  ALBUMIN 3.5 3.7 3.5  AST 35 22 24  ALT 51* 35 37  ALKPHOS 70 67 62  BILITOT 0.2* 0.6 0.4    RADIOGRAPHIC STUDIES: I have personally reviewed the radiological images as listed and agreed with the findings in the report. DG Chest 2 View  Result Date: 06/04/2023 CLINICAL DATA:  Shortness of breath. EXAM: CHEST - 2 VIEW COMPARISON:  February 24, 2023 FINDINGS: There is stable right-sided venous Port-A-Cath positioning. The heart size and mediastinal contours are within normal limits. Both lungs are clear. The visualized skeletal structures are unremarkable. IMPRESSION: No active cardiopulmonary disease. Electronically Signed   By:  Aram Candela M.D.   On: 06/04/2023 23:53   ECHOCARDIOGRAM COMPLETE  Result Date: 05/30/2023    ECHOCARDIOGRAM REPORT   Patient Name:   Cristina Baker Date of Exam: 05/30/2023 Medical Rec #:  [id number]        Height:       67.0 in Accession #:    [id number]       Weight:       190.0 lb Date of Birth:  1986/07/25        BSA:          1.979 m Patient Age:    37 years         BP:           140/86 mmHg Patient Gender: F                HR:           96 bpm. Exam Location:  [facility] Procedure: 2D Echo, Cardiac Doppler, Color Doppler and Strain Analysis Indications:     Encounter for monitoring cardiotoxic drug therapy  History:         Patient has no prior history of Echocardiogram examinations.                  Arrythmias:PVC and PAC; Risk Factors:Dyslipidemia. Breast CA.  Sonographer:     Mikki Harbor Referring Phys:  [id number] Michaelyn Barter Diagnosing Phys: Yvonne Kendall MD  Sonographer Comments: Global longitudinal strain was attempted. IMPRESSIONS  1. Left ventricular ejection fraction, by estimation, is 60 to 65%. The left ventricle has normal function. The left ventricle has no regional wall motion abnormalities. There is mild left ventricular hypertrophy. Left ventricular diastolic parameters were normal.  2. Right ventricular systolic function is normal. The right ventricular size is normal. There is normal pulmonary artery systolic pressure.  3. The mitral valve is normal in structure. Trivial mitral valve regurgitation. No evidence of mitral stenosis.  4. The aortic valve has an indeterminant number of cusps. Aortic valve regurgitation is not visualized. No aortic stenosis is present. FINDINGS  Left Ventricle: Left ventricular ejection fraction, by estimation, is 60 to 65%. The left ventricle has normal function. The left ventricle has no regional wall motion abnormalities. Global longitudinal strain performed but not reported based on interpreter judgement due to suboptimal tracking. The left  ventricular internal cavity size was normal in size. There is mild left ventricular hypertrophy. Left ventricular diastolic parameters were normal. Right Ventricle: The right ventricular size is normal. No increase in right ventricular wall thickness. Right ventricular  systolic function is normal. There is normal pulmonary artery systolic pressure. Left Atrium: Left atrial size was normal in size. Right Atrium: Right atrial size was normal in size. Pericardium: There is no evidence of pericardial effusion. Mitral Valve: The mitral valve is normal in structure. Trivial mitral valve regurgitation. No evidence of mitral valve stenosis. MV peak gradient, 4.0 mmHg. The mean mitral valve gradient is 3.0 mmHg. Tricuspid Valve: The tricuspid valve is normal in structure. Tricuspid valve regurgitation is trivial. Aortic Valve: The aortic valve has an indeterminant number of cusps. Aortic valve regurgitation is not visualized. No aortic stenosis is present. Aortic valve mean gradient measures 4.0 mmHg. Aortic valve peak gradient measures 7.4 mmHg. Aortic valve area, by VTI measures 2.82 cm. Pulmonic Valve: The pulmonic valve was normal in structure. Pulmonic valve regurgitation is trivial. No evidence of pulmonic stenosis. Aorta: The aortic root is normal in size and structure. Pulmonary Artery: The pulmonary artery is of normal size. Venous: The inferior vena cava was not well visualized. IAS/Shunts: No atrial level shunt detected by color flow Doppler.  LEFT VENTRICLE PLAX 2D LVIDd:         4.50 cm   Diastology LVIDs:         2.90 cm   LV e' medial:    9.03 cm/s LV PW:         1.22 cm   LV E/e' medial:  8.1 LV IVS:        1.21 cm   LV e' lateral:   15.10 cm/s LVOT diam:     2.00 cm   LV E/e' lateral: 4.8 LV SV:         66 LV SV Index:   33        2D Longitudinal Strain LVOT Area:     3.14 cm  2D Strain GLS Avg:     -11.9 %  RIGHT VENTRICLE RV Basal diam:  3.00 cm RV Mid diam:    2.10 cm RV S prime:     11.60 cm/s TAPSE  (M-mode): 1.8 cm LEFT ATRIUM             Index        RIGHT ATRIUM           Index LA diam:        3.30 cm 1.67 cm/m   RA Area:     10.30 cm LA Vol (A2C):   43.0 ml 21.73 ml/m  RA Volume:   19.70 ml  9.96 ml/m LA Vol (A4C):   32.0 ml 16.17 ml/m LA Biplane Vol: 37.1 ml 18.75 ml/m  AORTIC VALVE                    PULMONIC VALVE AV Area (Vmax):    2.56 cm     PV Vmax:       1.20 m/s AV Area (Vmean):   2.53 cm     PV Peak grad:  5.8 mmHg AV Area (VTI):     2.82 cm AV Vmax:           136.00 cm/s AV Vmean:          89.800 cm/s AV VTI:            0.234 m AV Peak Grad:      7.4 mmHg AV Mean Grad:      4.0 mmHg LVOT Vmax:         111.00 cm/s LVOT Vmean:  72.200 cm/s LVOT VTI:          0.210 m LVOT/AV VTI ratio: 0.90  AORTA Ao Root diam: 3.30 cm MITRAL VALVE               TRICUSPID VALVE MV Area (PHT): 3.85 cm    TR Peak grad:   24.4 mmHg MV Area VTI:   3.16 cm    TR Vmax:        247.00 cm/s MV Peak grad:  4.0 mmHg MV Mean grad:  3.0 mmHg    SHUNTS MV Vmax:       1.00 m/s    Systemic VTI:  0.21 m MV Vmean:      78.1 cm/s   Systemic Diam: 2.00 cm MV Decel Time: 197 msec MV E velocity: 72.80 cm/s MV A velocity: 66.60 cm/s MV E/A ratio:  1.09 Yvonne Kendall MD Electronically signed by Yvonne Kendall MD Signature Date/Time: 05/30/2023/3:44:41 PM    Final

## 2023-06-21 NOTE — Patient Instructions (Signed)
Take MagOx 2 pills twice a day starting tomorrow for next 3 weeks. Then you can drop to 1 pill twice a day.

## 2023-06-21 NOTE — Patient Instructions (Signed)
[facility] [place] [facility] [facility]  Discharge Instructions: Thank you for choosing [facility] to provide your oncology and hematology care.  If you have a lab appointment with the Cancer Center, please go directly to the Cancer Center and check in at the registration area.  Wear comfortable clothing and clothing appropriate for easy access to any Portacath or PICC line.   We strive to give you quality time with your provider. You may need to reschedule your appointment if you arrive late (15 or more minutes).  Arriving late affects you and other patients whose appointments are after yours.  Also, if you miss three or more appointments without notifying the office, you may be dismissed from the clinic at the provider's discretion.      For prescription refill requests, have your pharmacy contact our office and allow 72 hours for refills to be completed.    Today you received the following chemotherapy and/or immunotherapy agents- trastuzumab, pertuzumab, taxotere, carboplatin      To help prevent nausea and vomiting after your treatment, we encourage you to take your nausea medication as directed.  BELOW ARE SYMPTOMS THAT SHOULD BE REPORTED IMMEDIATELY: *FEVER GREATER THAN 100.4 F (38 C) OR HIGHER *CHILLS OR SWEATING *NAUSEA AND VOMITING THAT IS NOT CONTROLLED WITH YOUR NAUSEA MEDICATION *UNUSUAL SHORTNESS OF BREATH *UNUSUAL BRUISING OR BLEEDING *URINARY PROBLEMS (pain or burning when urinating, or frequent urination) *BOWEL PROBLEMS (unusual diarrhea, constipation, pain near the anus) TENDERNESS IN MOUTH AND THROAT WITH OR WITHOUT PRESENCE OF ULCERS (sore throat, sores in mouth, or a toothache) UNUSUAL RASH, SWELLING OR PAIN  UNUSUAL VAGINAL DISCHARGE OR ITCHING   Items with * indicate a potential emergency and should be followed up as soon as possible or go to the Emergency Department if any problems should occur.  Please show the CHEMOTHERAPY ALERT CARD or  IMMUNOTHERAPY ALERT CARD at check-in to the Emergency Department and triage nurse.  Should you have questions after your visit or need to cancel or reschedule your appointment, please contact [facility] [place] [facility]  [phone] and follow the prompts.  Office hours are 8:00 a.m. to 4:30 p.m. Monday - Friday. Please note that voicemails left after 4:00 p.m. may not be returned until the following business day.  We are closed weekends and major holidays. You have access to a nurse at all times for urgent questions. Please call the main number to the clinic [phone] and follow the prompts.  For any non-urgent questions, you may also contact your provider using MyChart. We now offer e-Visits for anyone 18 and older to request care online for non-urgent symptoms. For details visit [email or web address].   Also download the MyChart app! Go to the app store, search "MyChart", open the app, select , and log in with your MyChart username and password.    

## 2023-06-22 ENCOUNTER — Other Ambulatory Visit: Payer: Self-pay

## 2023-06-23 ENCOUNTER — Other Ambulatory Visit: Payer: Self-pay | Admitting: Internal Medicine

## 2023-06-23 DIAGNOSIS — F909 Attention-deficit hyperactivity disorder, unspecified type: Secondary | ICD-10-CM

## 2023-06-27 ENCOUNTER — Ambulatory Visit: Payer: Self-pay | Admitting: Surgery

## 2023-06-27 NOTE — H&P (Signed)
Expand All Collapse All Subjective:    CC: Malignant neoplasm of upper-outer quadrant of left female breast, unspecified estrogen receptor status (CMS/HHS-HCC) [C50.412] HPI:   Subjective Cristina Baker is a 37 y.o. female who returns for evaluation of above. S/p neoadjuvant chemo.  Complete response noted at both axilla and breast.  No specific concerns today.    Past Medical History: left breast CA   Past Surgical History:  has a past surgical history that includes insertion port-a-cath (02/24/2023).   Family History: family history is not on file.   Social History:  reports that she has never smoked. She has never been exposed to tobacco smoke. She has never used smokeless tobacco. No history on file for alcohol use and drug use.   Current Medications: has a current medication list which includes the following prescription(s): alprazolam, clobetasol, dexamethasone, dextroamphetamine-amphetamine, diphenoxylate-atropine, escitalopram oxalate, lidocaine-prilocaine, magnesium oxide, metoprolol succinate, and ondansetron.   Allergies:     Allergies as of 06/27/2023   (No Known Allergies)      ROS:  A 15 point review of systems was performed and was negative except as noted in HPI   Objective:      Objective BP (!) 132/90   Pulse (!) 144   Ht 167.6 cm (5' 5.98")   Wt 82.1 kg (181 lb)   BMI 29.23 kg/m    Constitutional :  No distress, cooperative, alert  Lymphatics/Throat:  Supple with no lymphadenopathy  Respiratory:  Clear to auscultation bilaterally  Cardiovascular:  Regular rate and rhythm  Gastrointestinal: Soft, non-tender, non-distended, no organomegaly.  Musculoskeletal: Steady gait and movement  Skin: Cool and moist  Psychiatric: Normal affect, non-agitated, not confused  Breast: Normal appearance and no palpable abnormality in bilateral breasts and axilla.  Chaperone present for exam.      LABS:  N/a    RADS: CLINICAL DATA:  37 year old female with biopsy  proven invasive mammary carcinoma in the upper-outer quadrant of the left breast and metastatic axillary adenopathy.   EXAM: DIGITAL DIAGNOSTIC UNILATERAL LEFT MAMMOGRAM WITH TOMOSYNTHESIS; ULTRASOUND LEFT BREAST LIMITED   TECHNIQUE: Left digital diagnostic mammography and breast tomosynthesis was performed.; Targeted ultrasound examination of the left breast was performed.   COMPARISON:  Previous exam(s).   ACR Breast Density Category b: There are scattered areas of fibroglandular density.   FINDINGS: There is a coil shaped biopsy marker clip in the upper-outer quadrant of the left breast. There are surrounding malignant type microcalcifications. The previously described spiculated mass has resolved. There is a biopsy marker clip in the left axilla. The previously seen enlarged lymph node is no longer pathologically enlarged.   Targeted ultrasound is performed, showing an echogenic focus in the left breast at 2 o'clock 5 cm from the nipple corresponding with the biopsy marker clip. The previously seen mass in this area is no longer visualized. Sonographic evaluation of the left axilla does not show any enlarged adenopathy.   IMPRESSION: Interval resolution of the spiculated mass in the upper-outer quadrant of the left breast and pathologically enlarged left axillary lymph node. Malignant type microcalcifications and a biopsy marker clip persists in the upper-outer quadrant of the left breast.   RECOMMENDATION: Continued treatment planning of the known left breast cancer with metastatic adenopathy is recommended.   I have discussed the findings and recommendations with the patient. If applicable, a reminder letter will be sent to the patient regarding the next appointment.   BI-RADS CATEGORY  6: Known biopsy-proven malignancy.     Electronically Signed  By: Baird Lyons M.D.   On: 05/02/2023 15:15     Assessment:    Malignant neoplasm of upper-outer quadrant of  left female breast, unspecified estrogen receptor status (CMS/HHS-HCC) [C50.412]   Plan:        Plan 1. Malignant neoplasm of upper-outer quadrant of left female breast, unspecified estrogen receptor status (CMS/HHS-HCC) [C50.412]  Discussed the risk of surgery including recurrence, chronic pain, post-op infxn, poor/delayed wound healing, poor cosmesis, seroma, hematoma formation, and possible re-operation to address said risks. The risks of general anesthetic, if used, includes MI, CVA, sudden death or even reaction to anesthetic medications also discussed.  Typical post-op recovery time and possbility of activity restrictions were also discussed.  Alternatives include continued observation.  Benefits include possible symptom relief, pathologic evaluation, and/or curative excision.    The patient verbalized understanding and all questions were answered to the patient's satisfaction.   2. Patient has elected to proceed with surgical treatment. Procedure will be scheduled after next appt with oncology for lab check.    Left Lumpectomy, SLNB, SCOUT placement for BOTH axilla and breast marker.  Will also use methylene blue at time of surgery to increase biopsy number, ideally >=3 sentinel nodes.       labs/images/medications/previous chart entries reviewed personally and relevant changes/updates noted above.

## 2023-06-28 ENCOUNTER — Other Ambulatory Visit: Payer: Self-pay | Admitting: Surgery

## 2023-06-28 DIAGNOSIS — C50412 Malignant neoplasm of upper-outer quadrant of left female breast: Secondary | ICD-10-CM

## 2023-06-29 ENCOUNTER — Other Ambulatory Visit: Payer: Self-pay | Admitting: Surgery

## 2023-06-29 DIAGNOSIS — C50412 Malignant neoplasm of upper-outer quadrant of left female breast: Secondary | ICD-10-CM

## 2023-06-30 ENCOUNTER — Ambulatory Visit
Admission: RE | Admit: 2023-06-30 | Discharge: 2023-06-30 | Disposition: A | Discharge status: Home / Self Care | Payer: Managed Care, Other (non HMO) | Source: Ambulatory Visit | Attending: Surgery | Admitting: Surgery

## 2023-06-30 DIAGNOSIS — C50412 Malignant neoplasm of upper-outer quadrant of left female breast: Secondary | ICD-10-CM | POA: Insufficient documentation

## 2023-06-30 HISTORY — PX: BREAST BIOPSY: SHX20

## 2023-06-30 MED ORDER — LIDOCAINE HCL 1 % IJ SOLN
10.0000 mL | Freq: Once | INTRAMUSCULAR | Status: DC
  Filled 2023-06-30: qty 10

## 2023-06-30 MED ORDER — LIDOCAINE HCL 1 % IJ SOLN
8.0000 mL | Freq: Once | INTRAMUSCULAR | Status: AC
  Administered 2023-06-30: 8 mL
  Filled 2023-06-30: qty 8

## 2023-07-02 ENCOUNTER — Other Ambulatory Visit: Payer: Self-pay | Admitting: Internal Medicine

## 2023-07-04 ENCOUNTER — Encounter: Payer: Self-pay | Admitting: Internal Medicine

## 2023-07-06 ENCOUNTER — Telehealth: Payer: Self-pay | Admitting: *Deleted

## 2023-07-06 NOTE — Telephone Encounter (Signed)
Patient called reporting that her ankles have. been very swollen for that past few days She has completed 6 cycles of chemotherapy and she has not had problems with this before now. She is keeping her feet propped up under her deak. I suggested that she also wear compression hose which can be purchased at [facility] She states she will try this. Please advise of any other recommendations.

## 2023-07-07 ENCOUNTER — Other Ambulatory Visit: Payer: Self-pay

## 2023-07-07 ENCOUNTER — Encounter: Payer: Self-pay | Admitting: Internal Medicine

## 2023-07-07 DIAGNOSIS — R609 Edema, unspecified: Secondary | ICD-10-CM

## 2023-07-07 DIAGNOSIS — C50912 Malignant neoplasm of unspecified site of left female breast: Secondary | ICD-10-CM

## 2023-07-07 NOTE — Telephone Encounter (Signed)
Called informed patient Dr. Mervyn Skeeters is recommending doppler. Patient in process of getting scheduled. No questions/ concerns

## 2023-07-08 ENCOUNTER — Ambulatory Visit
Admission: RE | Admit: 2023-07-08 | Discharge: 2023-07-08 | Disposition: A | Discharge status: Home / Self Care | Payer: Managed Care, Other (non HMO) | Source: Ambulatory Visit | Attending: Internal Medicine | Admitting: Internal Medicine

## 2023-07-08 DIAGNOSIS — R609 Edema, unspecified: Secondary | ICD-10-CM | POA: Diagnosis present

## 2023-07-08 DIAGNOSIS — Z171 Estrogen receptor negative status [ER-]: Secondary | ICD-10-CM | POA: Insufficient documentation

## 2023-07-08 DIAGNOSIS — C50912 Malignant neoplasm of unspecified site of left female breast: Secondary | ICD-10-CM | POA: Insufficient documentation

## 2023-07-12 ENCOUNTER — Inpatient Hospital Stay: Payer: Managed Care, Other (non HMO)

## 2023-07-12 DIAGNOSIS — Z5111 Encounter for antineoplastic chemotherapy: Secondary | ICD-10-CM | POA: Diagnosis not present

## 2023-07-12 DIAGNOSIS — C50912 Malignant neoplasm of unspecified site of left female breast: Secondary | ICD-10-CM

## 2023-07-12 LAB — COMPREHENSIVE METABOLIC PANEL
ALT: 41 U/L (ref 0–44)
AST: 22 U/L (ref 15–41)
Albumin: 3.3 g/dL — ABNORMAL LOW (ref 3.5–5.0)
Alkaline Phosphatase: 56 U/L (ref 38–126)
Anion gap: 6 (ref 5–15)
BUN: 16 mg/dL (ref 6–20)
CO2: 24 mmol/L (ref 22–32)
Calcium: 9.1 mg/dL (ref 8.9–10.3)
Chloride: 107 mmol/L (ref 98–111)
Creatinine, Ser: 0.6 mg/dL (ref 0.44–1.00)
GFR, Estimated: >60 mL/min (ref >60)
Glucose, Bld: 117 mg/dL — ABNORMAL HIGH (ref 70–99)
Potassium: 3.5 mmol/L (ref 3.5–5.1)
Sodium: 137 mmol/L (ref 135–145)
Total Bilirubin: 0.3 mg/dL (ref 0.3–1.2)
Total Protein: 6.2 g/dL — ABNORMAL LOW (ref 6.5–8.1)

## 2023-07-12 LAB — CBC WITH DIFFERENTIAL/PLATELET
Abs Immature Granulocytes: 0.12 10*3/uL — ABNORMAL HIGH (ref 0.00–0.07)
Basophils Absolute: 0 10*3/uL (ref 0.0–0.1)
Basophils Relative: 0 %
Eosinophils Absolute: 0 10*3/uL (ref 0.0–0.5)
Eosinophils Relative: 0 %
HCT: 31.4 % — ABNORMAL LOW (ref 36.0–46.0)
Hemoglobin: 9.9 g/dL — ABNORMAL LOW (ref 12.0–15.0)
Immature Granulocytes: 2 %
Lymphocytes Relative: 34 %
Lymphs Abs: 2.3 10*3/uL (ref 0.7–4.0)
MCH: 30.8 pg (ref 26.0–34.0)
MCHC: 31.5 g/dL (ref 30.0–36.0)
MCV: 97.8 fL (ref 80.0–100.0)
Monocytes Absolute: 0.8 10*3/uL (ref 0.1–1.0)
Monocytes Relative: 11 %
Neutro Abs: 3.6 10*3/uL (ref 1.7–7.7)
Neutrophils Relative %: 53 %
Platelets: 210 10*3/uL (ref 150–400)
RBC: 3.21 MIL/uL — ABNORMAL LOW (ref 3.87–5.11)
RDW: 16.4 % — ABNORMAL HIGH (ref 11.5–15.5)
WBC: 6.7 10*3/uL (ref 4.0–10.5)
nRBC: 0 % (ref 0.0–0.2)

## 2023-07-12 LAB — MAGNESIUM: Magnesium: 1.7 mg/dL (ref 1.7–2.4)

## 2023-07-14 ENCOUNTER — Ambulatory Visit: Payer: Managed Care, Other (non HMO) | Admitting: Obstetrics and Gynecology

## 2023-07-14 ENCOUNTER — Other Ambulatory Visit (HOSPITAL_COMMUNITY)
Admission: RE | Admit: 2023-07-14 | Discharge: 2023-07-14 | Disposition: A | Discharge status: Home / Self Care | Payer: Managed Care, Other (non HMO) | Source: Ambulatory Visit | Attending: Obstetrics and Gynecology | Admitting: Obstetrics and Gynecology

## 2023-07-14 ENCOUNTER — Encounter: Payer: Self-pay | Admitting: Obstetrics and Gynecology

## 2023-07-14 VITALS — BP 112/70 | Ht 66.0 in | Wt 205.0 lb

## 2023-07-14 DIAGNOSIS — Z124 Encounter for screening for malignant neoplasm of cervix: Secondary | ICD-10-CM | POA: Insufficient documentation

## 2023-07-14 DIAGNOSIS — C50912 Malignant neoplasm of unspecified site of left female breast: Secondary | ICD-10-CM

## 2023-07-14 DIAGNOSIS — L900 Lichen sclerosus et atrophicus: Secondary | ICD-10-CM

## 2023-07-14 DIAGNOSIS — Z803 Family history of malignant neoplasm of breast: Secondary | ICD-10-CM

## 2023-07-14 DIAGNOSIS — Z01419 Encounter for gynecological examination (general) (routine) without abnormal findings: Secondary | ICD-10-CM | POA: Diagnosis not present

## 2023-07-14 DIAGNOSIS — Z1151 Encounter for screening for human papillomavirus (HPV): Secondary | ICD-10-CM

## 2023-07-14 MED ORDER — CLOBETASOL PROPIONATE 0.05 % EX OINT
TOPICAL_OINTMENT | CUTANEOUS | 0 refills | Status: AC
Start: 2023-07-14 — End: ?

## 2023-07-14 NOTE — Patient Instructions (Signed)
I value your feedback and you entrusting us with your care. If you get a [facility] patient survey, I would appreciate you taking the time to let us know about your experience today. Thank you! ? ? ?

## 2023-07-14 NOTE — Progress Notes (Signed)
PCP:  Sherron Monday, MD   Chief Complaint  Patient presents with   Gynecologic Exam    No concerns     HPI:      Ms. Cristina Baker is a 37 y.o. [phone] whose LMP was No LMP recorded. (Menstrual status: Bilateral Tubal Ligation)., presents today for her annual examination.  Her menses are absent since chemo start for breast cancer 3/24. No BTB, no dysmen.   Sex activity: single partner, contraception - tubal ligation. Is having vaginal dryness, improved with lubricants. Since breast cancer is ER/PR- and her 2 neu pos only, may be able to add vag ERT. Pt to discuss with oncology.  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 Has been on several rounds of oral steroid since breast cancer dx so LS improved; has not had to use clobetasol recently.   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 Pt has been doing chemo/lupron and has lumpectomy with subsequent radiation scheduled 8/24. Pt with strong FH breast cancer on her pat side; no FH ovar cancer. MyRisk neg 2015; had neg update testing with oncology. She does SBE.   Tobacco use: The patient denies current or previous tobacco use. Alcohol use: none No drug use.  Exercise: not active  She does not get adequate calcium or Vitamin D in her diet. Hx of Vit D deficiency in past with PCP.  Labs with PCP  Has been having terrible diarrhea as side effect of chemo. Wonders if she now has hemorrhoid.   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   Encounter for antineoplastic chemotherapy 03/28/2023   Antineoplastic chemotherapy induced anemia 03/28/2023   Chemotherapy induced diarrhea 03/28/2023   Leukocytosis 03/28/2023   Genetic testing  03/02/2023   Adult ADHD (attention deficit hyperactivity disorder) 02/22/2023   Mixed hyperlipidemia 02/22/2023   Anxiety 02/22/2023   Encounter for echocardiogram before initiation of chemotherapy 02/18/2023   Family history of breast cancer 02/18/2023   Breast cancer (HCC) 02/17/2023   Heart palpitations 04/09/2022   Premature atrial contractions 04/09/2022   Premature ventricular contractions 04/09/2022   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 01/09/2021   Lichen sclerosus 06/02/2020    Past Surgical History:  Procedure Laterality Date   BREAST BIOPSY Left 02/10/2023   invasive mammary carcinoma   BREAST BIOPSY Left 02/10/2023   [place] Core Hydromark clip path pending   BREAST BIOPSY Left 02/10/2023   [place] LT BREAST BX W LOC DEV 1ST LESION IMG BX SPEC US GUIDE 02/10/2023 [facility]   BREAST BIOPSY Left 06/30/2023   [place] LT RADIO FREQUENCY TAG EA ADD LESION LOC US GUIDE 06/30/2023 [facility]   CESAREAN SECTION  01/03/2007   CESAREAN SECTION WITH BILATERAL TUBAL LIGATION N/A 03/26/2016   Procedure: CESAREAN SECTION WITH BILATERAL TUBAL LIGATION;  Surgeon: Conard Novak, MD;  Location: [facility];  Service: Obstetrics;  Laterality: N/A;   COLPOSCOPY     IUD REMOVAL     PORTACATH PLACEMENT N/A 02/24/2023   Procedure: INSERTION PORT-A-CATH;  Surgeon: Sung Amabile, DO;  Location: [facility];  Service: General;  Laterality: N/A;    Family History  Problem Relation Age of Onset   Hypercholesterolemia Mother    Diabetes Mellitus II Father    Hypertension Father    Breast cancer Paternal Aunt 32   Breast cancer Paternal Aunt 65   Breast cancer Paternal Grandmother        69s   Throat cancer Paternal Grandfather    Heart attack Paternal Grandfather     Social History   Socioeconomic History   Marital status: Married    Spouse name: Verdon Cummins   Number of children: 3   Years of education: Not on file    Highest education level: Not on file  Occupational History   Not on file  Tobacco Use   Smoking status: Former    Types: Cigarettes   Smokeless tobacco: Never  Vaping Use   Vaping status: Never Used  Substance and Sexual Activity   Alcohol use: Yes    Comment: rarely   Drug use: No   Sexual activity: Yes    Birth control/protection: Surgical    Comment: tubal ligation  Other Topics Concern   Not on file  Social History Narrative   Not on file   Social Determinants of Health   Financial Resource Strain: Low Risk  (04/05/2023)   Overall Financial Resource Strain (CARDIA)    Difficulty of Paying Living Expenses: Not very hard  Food Insecurity: No Food Insecurity (04/05/2023)   Hunger Vital Sign    Worried About Running Out of Food in the Last Year: Never true    Ran Out of Food in the Last Year: Never true  Transportation Needs: No Transportation Needs (02/17/2023)   PRAPARE - Administrator, Civil Service (Medical): No    Lack of Transportation (Non-Medical): No  Physical Activity: Inactive (04/05/2023)   Exercise Vital Sign    Days of Exercise per Week: 0 days    Minutes of Exercise per Session: 0 min  Stress: Stress Concern Present (04/05/2023)   [employer] of Occupational Health - Occupational Stress Questionnaire    Feeling of Stress : To some extent  Social Connections: Socially Integrated (04/05/2023)   Social Connection and Isolation Panel [NHANES]    Frequency of Communication with Friends and Family: Three times a week    Frequency of Social Gatherings with Friends and Family: Once a week    Attends Religious Services: 1 to 4 times per year    Active Member of [employer] or Organizations: Yes    Attends Banker Meetings: 1 to 4 times per year    Marital Status: Married  Catering manager Violence: Not At Risk (02/17/2023)   Humiliation, Afraid, Rape, and Kick questionnaire    Fear of Current or Ex-Partner: No    Emotionally Abused: No     Physically Abused: No    Sexually Abused: No     Current Outpatient Medications:    ALPRAZolam (XANAX) 0.5 MG tablet, USE AS DIRECTED TAKE HALF TAB TWICE DAILY FOR ANXIETY, Disp: 15 tablet, Rfl: 2   amphetamine-dextroamphetamine (ADDERALL) 20 MG tablet, Take 1 tablet (20 mg total) by mouth 2 (two) times daily., Disp: 60 tablet, Rfl: 0   escitalopram (LEXAPRO) 10 MG tablet, TAKE 1 TABLET BY MOUTH EVERY DAY IN THE MORNING, Disp: 90 tablet, Rfl: 1   lidocaine-prilocaine (EMLA) cream, Apply to affected area once, Disp: 30 g, Rfl: 3   magnesium oxide (MAG-OX) 400 (240 Mg) MG tablet, Take 1 tablet (400 mg total) by mouth 2 (two) times daily. Patient can stop after  30 days., Disp: 60 tablet, Rfl: 0   metoprolol succinate (TOPROL-XL) 25 MG 24 hr tablet, TAKE 1 TABLET BY MOUTH ONCE A DAY **MAKE FOLLOW UP APPT WITH DR. KHAN FOR REFILLS**, Disp: 90 tablet, Rfl: 0   ondansetron (ZOFRAN) 8 MG tablet, TAKE 1 TABLET (8 MG TOTAL) BY MOUTH EVERY 8 (EIGHT) HOURS AS NEEDED FOR NAUSEA OR VOMITING. START ON THE THIRD DAY AFTER CHEMOTHERAPY., Disp: 30 tablet, Rfl: 1   oxyCODONE (OXY IR/ROXICODONE) 5 MG immediate release tablet, Take 0.5-1 tablets (2.5-5 mg total) by mouth every 6 (six) hours as needed for severe pain., Disp: 30 tablet, Rfl: 0   prochlorperazine (COMPAZINE) 10 MG tablet, Take 1 tablet (10 mg total) by mouth every 6 (six) hours as needed for nausea or vomiting., Disp: 30 tablet, Rfl: 1   clobetasol ointment (TEMOVATE) 0.05 %, Apply to affected area once wkly as maintenance, Disp: 45 g, Rfl: 0   dexamethasone (DECADRON) 4 MG tablet, TAKE 2 TABS BY MOUTH 2 TIMES DAILY STARTING DAY BEFORE CHEMO. THEN TAKE 2 TABS DAILY FOR 2 DAYS STARTING DAY AFTER CHEMO. TAKE WITH FOOD. (Patient not taking: Reported on 07/14/2023), Disp: 30 tablet, Rfl: 1 No current facility-administered medications for this visit.  Facility-Administered Medications Ordered in Other Visits:    heparin lock flush 100 unit/mL, 500 Units,  Intravenous, Once, Michaelyn Barter, MD     ROS:  Review of Systems  Constitutional:  Negative for fatigue, fever and unexpected weight change.  Respiratory:  Negative for cough, shortness of breath and wheezing.   Cardiovascular:  Negative for chest pain, palpitations and leg swelling.  Gastrointestinal:  Positive for diarrhea. Negative for blood in stool, constipation, nausea and vomiting.  Endocrine: Negative for cold intolerance, heat intolerance and polyuria.  Genitourinary:  Negative for dyspareunia, dysuria, flank pain, frequency, genital sores, hematuria, menstrual problem, pelvic pain, urgency, vaginal bleeding, vaginal discharge and vaginal pain.  Musculoskeletal:  Negative for back pain, joint swelling and myalgias.  Skin:  Negative for rash.  Neurological:  Negative for dizziness, syncope, light-headedness, numbness and headaches.  Hematological:  Negative for adenopathy.  Psychiatric/Behavioral:  Negative for agitation, confusion, sleep disturbance and suicidal ideas. The patient is not nervous/anxious.    BREAST: No symptoms   Objective: BP 112/70   Ht 5\' 6"  (1.676 m)   Wt 205 lb (93 kg)   BMI 33.09 kg/m    Physical Exam Constitutional:      Appearance: She is well-developed.  Genitourinary:     Vulva normal.     Right Labia: rash.     Right Labia: No tenderness or lesions.    Left Labia: rash.     Left Labia: No tenderness or lesions.       No vaginal discharge, erythema or tenderness.      Right Adnexa: not tender and no mass present.    Left Adnexa: not tender and no mass present.    No cervical friability or polyp.     Uterus is not enlarged or tender.  Rectum:     External hemorrhoid present.  Breasts:    Right: No mass, nipple discharge, skin change or tenderness.     Left: No mass, nipple discharge, skin change or tenderness.  Neck:     Thyroid: No thyromegaly.  Cardiovascular:     Rate and Rhythm: Normal rate and regular rhythm.     Heart  sounds: Normal heart sounds. No murmur heard. Pulmonary:     Effort: Pulmonary effort is normal.  Breath sounds: Normal breath sounds.  Abdominal:     Palpations: Abdomen is soft.     Tenderness: There is no abdominal tenderness. There is no guarding or rebound.  Musculoskeletal:        General: Normal range of motion.     Cervical back: Normal range of motion.  Lymphadenopathy:     Cervical: No cervical adenopathy.  Neurological:     General: No focal deficit present.     Mental Status: She is alert and oriented to person, place, and time.     Cranial Nerves: No cranial nerve deficit.  Skin:    General: Skin is warm and dry.  Psychiatric:        Mood and Affect: Mood normal.        Behavior: Behavior normal.        Thought Content: Thought content normal.        Judgment: Judgment normal.  Vitals reviewed.     Assessment/Plan: Encounter for annual routine gynecological examination  Cervical cancer screening - Plan: Cytology - PAP  Screening for HPV (human papillomavirus) - Plan: Cytology - PAP  Malignant neoplasm of left breast in female, estrogen receptor negative, unspecified site of breast (HCC); pt followed by oncology. Has upcoming surgery  Family history of breast cancer--pt is MyRisk and update testing neg  Lichen sclerosus - Plan: clobetasol ointment (TEMOVATE) 0.05 %; has very minimal sx on exam but recommended restarting clobetasol once wkly as preventive/maintenance. May need to go back to 2-3 times wkly prn. Rx RF. F/u prn.   Meds ordered this encounter  Medications   clobetasol ointment (TEMOVATE) 0.05 %    Sig: Apply to affected area once wkly as maintenance    Dispense:  45 g    Refill:  0    Order Specific Question:   Supervising Provider    Answer:   Waymon Budge             GYN counsel adequate intake of calcium and vitamin D, diet and exercise     F/U  Return in about 1 year (around 07/13/2024).  Joachim Carton B. Nihal Marzella,  PA-C 07/14/2023 2:17 PM

## 2023-07-15 ENCOUNTER — Encounter: Payer: Managed Care, Other (non HMO) | Admitting: Medical Oncology

## 2023-07-15 ENCOUNTER — Inpatient Hospital Stay: Payer: Managed Care, Other (non HMO) | Attending: Internal Medicine | Admitting: Internal Medicine

## 2023-07-15 ENCOUNTER — Telehealth: Payer: Self-pay

## 2023-07-15 ENCOUNTER — Encounter: Payer: Self-pay | Admitting: Internal Medicine

## 2023-07-15 VITALS — BP 127/93 | HR 111 | Temp 98.6°F | Resp 16 | Ht 66.0 in | Wt 201.0 lb

## 2023-07-15 DIAGNOSIS — C50412 Malignant neoplasm of upper-outer quadrant of left female breast: Secondary | ICD-10-CM | POA: Insufficient documentation

## 2023-07-15 DIAGNOSIS — M7989 Other specified soft tissue disorders: Secondary | ICD-10-CM | POA: Insufficient documentation

## 2023-07-15 DIAGNOSIS — Z8041 Family history of malignant neoplasm of ovary: Secondary | ICD-10-CM | POA: Insufficient documentation

## 2023-07-15 DIAGNOSIS — G620 Drug-induced polyneuropathy: Secondary | ICD-10-CM | POA: Diagnosis not present

## 2023-07-15 DIAGNOSIS — T451X5A Adverse effect of antineoplastic and immunosuppressive drugs, initial encounter: Secondary | ICD-10-CM | POA: Insufficient documentation

## 2023-07-15 DIAGNOSIS — C50912 Malignant neoplasm of unspecified site of left female breast: Secondary | ICD-10-CM | POA: Diagnosis not present

## 2023-07-15 DIAGNOSIS — F419 Anxiety disorder, unspecified: Secondary | ICD-10-CM | POA: Diagnosis not present

## 2023-07-15 DIAGNOSIS — Z803 Family history of malignant neoplasm of breast: Secondary | ICD-10-CM | POA: Diagnosis not present

## 2023-07-15 DIAGNOSIS — R197 Diarrhea, unspecified: Secondary | ICD-10-CM | POA: Diagnosis not present

## 2023-07-15 DIAGNOSIS — D6481 Anemia due to antineoplastic chemotherapy: Secondary | ICD-10-CM | POA: Insufficient documentation

## 2023-07-15 DIAGNOSIS — M899 Disorder of bone, unspecified: Secondary | ICD-10-CM | POA: Diagnosis not present

## 2023-07-15 DIAGNOSIS — Z171 Estrogen receptor negative status [ER-]: Secondary | ICD-10-CM | POA: Insufficient documentation

## 2023-07-15 DIAGNOSIS — Z808 Family history of malignant neoplasm of other organs or systems: Secondary | ICD-10-CM | POA: Diagnosis not present

## 2023-07-15 DIAGNOSIS — Z87891 Personal history of nicotine dependence: Secondary | ICD-10-CM | POA: Insufficient documentation

## 2023-07-15 MED ORDER — DULOXETINE HCL 30 MG PO CPEP: 30.0000 mg | ORAL_CAPSULE | Freq: Every day | ORAL | 0 refills | Status: DC

## 2023-07-15 NOTE — Telephone Encounter (Signed)
Patient scheduled for 11:30 with Maralyn Sago

## 2023-07-15 NOTE — Telephone Encounter (Signed)
Patient called stating she is still having leg and feet swelling.She states her legs and feet are still very sore. Patient wants to know what can be done for this because she doesn't like being in pain daily with this.

## 2023-07-15 NOTE — Progress Notes (Signed)
Having pain through out both legs and swelling. Still having problems.

## 2023-07-15 NOTE — Progress Notes (Signed)
Cancer Center CONSULT NOTE  Patient Care Team: Sherron Monday, MD as PCP - General (Internal Medicine) Hulen Luster, RN as Oncology Nurse Navigator  CANCER STAGING   Cancer Staging  Breast cancer [facility]) Staging form: Breast, AJCC 8th Edition - Clinical: Stage IIB (cT2, cN1, cM0, G3, ER-, PR-, HER2+) - Signed by Michaelyn Barter, MD on 02/17/2023 Histologic grading system: 3 grade system   ASSESSMENT & PLAN:  Cristina Baker 37 y.o. 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  # Bilateral leg swelling # Chemotherapy induced neuropathy -Noticed bilateral leg swelling for few weeks.  Ultrasound Doppler was negative for clots.  She is using compression stockings which helps.  But she will get another 1 which may be better fitted.  She is also complaining of pain in her bilateral feet.  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  I discussed about doing Cymbalta 30 mg daily.  If there is no response may consider increasing to 60 mg.  We also discussed about doing stationary bike or treadmill to improve the strength of her muscles which can also help with venous drainage.  Continue with leg elevation when possible.  Also discussed about the role of low-dose Lasix pill as needed to help with symptomatic management.  She would like to hold off on Lasix.  I will follow-up with her in 3 weeks to discuss path report.  # Left breast invasive ductal cancer, ER/PR negative, HER2 positive, stage IIb # Encounter for antineoplastic chemotherapy -Self detected left breast mass.  Status post biopsy on 02/10/2023.  -CT chest abdomen pelvis (03/01/2023)- Ill-defined mixed focus in the posterior right ninth rib corresponding with increased radiotracer uptake on the bone scan.  Nonspecific but suspicious for osseous metastatic disease.  I discussed the  finding with Dr. Archer Asa of IR who advised that the lesion is very small in size about 6 mm and is difficult to biopsy. Repeat CT chest on 04/15/2023 showed unchanged right ninth rib lesion.  - Mid treatment mammogram and ultrasound showed complete resolution of the breast mass.   -Completed neoadjuvant TCHP x 6 cycle.  Scheduled with Dr. Tonna Boehringer for lumpectomy with SLNB on August 15.  # Right ninth rib lesion -Questionable.  Last CT in May 2024 showed stable 7 mm lesion.  Case was previously discussed with Dr. Ruthe Mannan from IR and lesion was too small to biopsy. -I will repeat CT in August 2024 to closely monitor the rib lesion.  However that would not change our management to proceed with the surgery.  # Chemo induced diarrhea -Resolved  # Chemotherapy-induced anemia -Vitamin B12, folate and iron panel normal  # Family history of breast cancer -Genetic testing was negative  # Anxiety -Xanax 0.25 mg twice daily as needed  # History of palpitations  -Reportedly, she had workup with cardiology which was negative.  It was deemed secondary to panic. -On metoprolol.  # Access-port placed by Dr. Tonna Boehringer.  Has Emla cream  No orders of the defined types were placed in this encounter.  RTC in 3 weeks to discuss path report.  The total time spent in the appointment was 30 minutes encounter with patients including review of chart and various tests results, discussions about plan of care and coordination of care plan   All questions were answered. The patient knows to call the clinic with any problems, questions  or concerns. No barriers to learning was detected.  Michaelyn Barter, MD 8/2/20243:57 PM   HISTORY OF PRESENTING ILLNESS:  Cristina Baker 37 y.o. 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  She denies any pain or tenderness in the breast.   Denies any discharge.  There is family history of breast cancer in paternal grandmother and 2 aunts.  She had a BRCA testing about 15 years ago which was negative.  She has completed family-planning.  Underwent tubal ligation.  She has 3 children 1 in 14s second in his teen and the youngest is 39 years old.    Interval history Patient was seen today as an acute visit for bilateral leg swelling and pain.  After the completion of chemotherapy, patient has been experiencing bilateral leg swelling for the past few weeks.  Associated with pain in her bilateral feet with numbness.  Venous Doppler was negative for blood clot.  She has been using compression hose which is helping but was bothered with the pain.  Still feeling achiness in her muscles.  I have reviewed her chart and materials related to her cancer extensively and collaborated history with the patient. Summary of oncologic history is as follows: Oncology History  Breast cancer (HCC)  01/26/2023 Initial Diagnosis   Patient felt palpable lump in left breast   02/10/2023 Mammogram     IMPRESSION: 1. There is a suspicious 24 mm mass at the site of palpable concern which is concerning for malignancy. It demonstrates 3 cm of associated calcifications. Recommend ultrasound-guided biopsy for definitive characterization with attention on post marker placement mammogram to assess for extent of calcifications in relation to the biopsy marking clip. 2. There is an indeterminate LEFT axillary lymph node. Recommend ultrasound-guided biopsy for definitive characterization. 3. No mammographic evidence of malignancy in the RIGHT breast.   02/10/2023 Pathology Results   DIAGNOSIS: A.  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  Size of invasive carcinoma: 1.7 cm in linear length in this sample Histologic grade of invasive carcinoma: Grade 3 Glandular/tubular differentiation score: 3 Nuclear  pleomorphism score: 3 Mitotic rate score: 2 Total score: 8 Ductal carcinoma in situ: Present, high-grade Lymphovascular invasion: Indeterminate See comment.  B.  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   Estrogen Receptor (ER) Status: NEGATIVE (LESS THAN 1%)         Internal control cells positive Progesterone Receptor (PgR) Status: Negative HER2 (by immunohistochemistry): Positive Ki-67: Not performed    02/17/2023 Cancer Staging   Staging form: Breast, AJCC 8th Edition - Clinical: Stage IIB (cT2, cN1, cM0, G3, ER-, PR-, HER2+) - Signed by Michaelyn Barter, MD on 02/17/2023 Histologic grading system: 3 grade system   03/07/2023 -  Chemotherapy   Patient is on Treatment Plan : BREAST  Docetaxel + Carboplatin + Trastuzumab + Pertuzumab  (TCHP) q21d       Genetic Testing   Negative genetic testing. No pathogenic variants identified on the [facility] Multi-Cancer+RNA panel. The report date is 03/01/2023.  The Multi-Cancer + RNA Panel offered by [facility] includes sequencing and/or deletion/duplication analysis of the following 70 genes:  AIP*, ALK, APC*, ATM*, AXIN2*, BAP1*, BARD1*, BLM*, BMPR1A*, BRCA1*, BRCA2*, BRIP1*, CDC73*, CDH1*, CDK4, CDKN1B*, CDKN2A, CHEK2*, CTNNA1*, DICER1*, EPCAM, EGFR, FH*, FLCN*, GREM1, HOXB13, KIT, LZTR1, MAX*, MBD4, MEN1*, MET,  MITF, MLH1*, MSH2*, MSH3*, MSH6*, MUTYH*, NF1*, NF2*, NTHL1*, PALB2*, PDGFRA, PMS2*, POLD1*, POLE*, POT1*, PRKAR1A*, PTCH1*, PTEN*, RAD51C*, RAD51D*, RB1*, RET, SDHA*, SDHAF2*, SDHB*, SDHC*, SDHD*, SMAD4*, SMARCA4*, SMARCB1*, SMARCE1*, STK11*, SUFU*, TMEM127*, TP53*, TSC1*, TSC2*, VHL*. RNA analysis is performed for * genes.     MEDICAL HISTORY:  Past Medical History:  Diagnosis Date   Abnormal Pap smear of cervix    Adult ADHD    BRCA negative 2015   MyRisk neg; IBIS=18/8%   Breast cancer (HCC) 01/2023   left   Family history of breast cancer 2015   IBIS=18.8%    Family history of ovarian cancer    Herpes genitalis    Lichen sclerosus    PAC (premature atrial contraction)    PONV (postoperative nausea and vomiting)    nausea   PVC's (premature ventricular contractions)    Tachycardia, unspecified     SURGICAL HISTORY: Past Surgical History:  Procedure Laterality Date   BREAST BIOPSY Left 02/10/2023   invasive mammary carcinoma   BREAST BIOPSY Left 02/10/2023   [place] Core Hydromark clip path pending   BREAST BIOPSY Left 02/10/2023   [place] LT BREAST BX W LOC DEV 1ST LESION IMG BX SPEC US GUIDE 02/10/2023 [facility]   BREAST BIOPSY Left 06/30/2023   [place] LT RADIO FREQUENCY TAG EA ADD LESION LOC US GUIDE 06/30/2023 [facility]   CESAREAN SECTION  01/03/2007   CESAREAN SECTION WITH BILATERAL TUBAL LIGATION N/A 03/26/2016   Procedure: CESAREAN SECTION WITH BILATERAL TUBAL LIGATION;  Surgeon: Conard Novak, MD;  Location: [facility];  Service: Obstetrics;  Laterality: N/A;   COLPOSCOPY     IUD REMOVAL     PORTACATH PLACEMENT N/A 02/24/2023   Procedure: INSERTION PORT-A-CATH;  Surgeon: Sung Amabile, DO;  Location: [facility];  Service: General;  Laterality: N/A;    SOCIAL HISTORY: Social History   Socioeconomic History   Marital status: Married    Spouse name: Verdon Cummins   Number of children: 3   Years of education: Not on file   Highest education level: Not on file  Occupational History   Not on file  Tobacco Use   Smoking status: Former    Types: Cigarettes   Smokeless tobacco: Never  Vaping Use   Vaping status: Never Used  Substance and Sexual Activity   Alcohol use: Yes    Comment: rarely   Drug use: No   Sexual activity: Yes    Birth control/protection: Surgical    Comment: tubal ligation  Other Topics Concern   Not on file  Social History Narrative   Not on file   Social Determinants of Health   Financial Resource Strain: Low Risk  (04/05/2023)   Overall Financial Resource Strain (CARDIA)    Difficulty of Paying Living  Expenses: Not very hard  Food Insecurity: No Food Insecurity (04/05/2023)   Hunger Vital Sign    Worried About Running Out of Food in the Last Year: Never true    Ran Out of Food in the Last Year: Never true  Transportation Needs: No Transportation Needs (02/17/2023)   PRAPARE - Administrator, Civil Service (Medical): No    Lack of Transportation (Non-Medical): No  Physical Activity: Inactive (04/05/2023)   Exercise Vital Sign    Days of Exercise per Week: 0 days    Minutes of Exercise per Session: 0 min  Stress: Stress Concern Present (04/05/2023)   [employer] of Occupational Health - Occupational Stress Questionnaire    Feeling of Stress :  To some extent  Social Connections: Socially Integrated (04/05/2023)   Social Connection and Isolation Panel [NHANES]    Frequency of Communication with Friends and Family: Three times a week    Frequency of Social Gatherings with Friends and Family: Once a week    Attends Religious Services: 1 to 4 times per year    Active Member of [employer] or Organizations: Yes    Attends Banker Meetings: 1 to 4 times per year    Marital Status: Married  Catering manager Violence: Not At Risk (02/17/2023)   Humiliation, Afraid, Rape, and Kick questionnaire    Fear of Current or Ex-Partner: No    Emotionally Abused: No    Physically Abused: No    Sexually Abused: No    FAMILY HISTORY: Family History  Problem Relation Age of Onset   Hypercholesterolemia Mother    Diabetes Mellitus II Father    Hypertension Father    Breast cancer Paternal Aunt 79   Breast cancer Paternal Aunt 56   Breast cancer Paternal Grandmother        84s   Throat cancer Paternal Grandfather    Heart attack Paternal Grandfather     ALLERGIES:  has No Known Allergies.  MEDICATIONS:  Current Outpatient Medications  Medication Sig Dispense Refill   ALPRAZolam (XANAX) 0.5 MG tablet USE AS DIRECTED TAKE HALF TAB TWICE DAILY FOR ANXIETY 15 tablet 2    amphetamine-dextroamphetamine (ADDERALL) 20 MG tablet Take 1 tablet (20 mg total) by mouth 2 (two) times daily. 60 tablet 0   clobetasol ointment (TEMOVATE) 0.05 % Apply to affected area once wkly as maintenance 45 g 0   DULoxetine (CYMBALTA) 30 MG capsule Take 1 capsule (30 mg total) by mouth daily. 90 capsule 0   escitalopram (LEXAPRO) 10 MG tablet TAKE 1 TABLET BY MOUTH EVERY DAY IN THE MORNING 90 tablet 1   lidocaine-prilocaine (EMLA) cream Apply to affected area once 30 g 3   magnesium oxide (MAG-OX) 400 (240 Mg) MG tablet Take 1 tablet (400 mg total) by mouth 2 (two) times daily. Patient can stop after 30 days. 60 tablet 0   metoprolol succinate (TOPROL-XL) 25 MG 24 hr tablet TAKE 1 TABLET BY MOUTH ONCE A DAY **MAKE FOLLOW UP APPT WITH DR. KHAN FOR REFILLS** 90 tablet 0   ondansetron (ZOFRAN) 8 MG tablet TAKE 1 TABLET (8 MG TOTAL) BY MOUTH EVERY 8 (EIGHT) HOURS AS NEEDED FOR NAUSEA OR VOMITING. START ON THE THIRD DAY AFTER CHEMOTHERAPY. 30 tablet 1   oxyCODONE (OXY IR/ROXICODONE) 5 MG immediate release tablet Take 0.5-1 tablets (2.5-5 mg total) by mouth every 6 (six) hours as needed for severe pain. 30 tablet 0   prochlorperazine (COMPAZINE) 10 MG tablet Take 1 tablet (10 mg total) by mouth every 6 (six) hours as needed for nausea or vomiting. 30 tablet 1   dexamethasone (DECADRON) 4 MG tablet TAKE 2 TABS BY MOUTH 2 TIMES DAILY STARTING DAY BEFORE CHEMO. THEN TAKE 2 TABS DAILY FOR 2 DAYS STARTING DAY AFTER CHEMO. TAKE WITH FOOD. (Patient not taking: Reported on 07/14/2023) 30 tablet 1   No current facility-administered medications for this visit.   Facility-Administered Medications Ordered in Other Visits  Medication Dose Route Frequency Provider Last Rate Last Admin   heparin lock flush 100 unit/mL  500 Units Intravenous Once Michaelyn Barter, MD        REVIEW OF SYSTEMS:   Pertinent information mentioned in HPI All other systems were reviewed with the patient and  are negative.  PHYSICAL  EXAMINATION: ECOG PERFORMANCE STATUS: 0 - Asymptomatic  Vitals:   07/15/23 1325  BP: (!) 127/93  Pulse: (!) 111  Resp: 16  Temp: 98.6 F (37 C)  SpO2: 97%    Filed Weights   07/15/23 1325  Weight: 201 lb (91.2 kg)     GENERAL:alert, no distress and comfortable SKIN: skin color, texture, turgor are normal, no rashes or significant lesions EYES: normal, conjunctiva are pink and non-injected, sclera clear OROPHARYNX:no exudate, no erythema and lips, buccal mucosa, and tongue normal  NECK: supple, thyroid normal size, non-tender, without nodularity LYMPH:  no palpable lymphadenopathy in the cervical, axillary or inguinal LUNGS: clear to auscultation and percussion with normal breathing effort HEART: regular rate & rhythm and no murmurs and no lower extremity edema ABDOMEN:abdomen soft, non-tender and normal bowel sounds Musculoskeletal:no cyanosis of digits and no clubbing  PSYCH: alert & oriented x 3 with fluent speech NEURO: no focal motor/sensory deficits  LABORATORY DATA:  I have reviewed the data as listed Lab Results  Component Value Date   WBC 6.7 07/12/2023   HGB 9.9 (L) 07/12/2023   HCT 31.4 (L) 07/12/2023   MCV 97.8 07/12/2023   PLT 210 07/12/2023   Recent Labs    05/31/23 0901 06/21/23 0826 07/12/23 1050  NA 136 137 137  K 4.0 3.5 3.5  CL 105 106 107  CO2 23 23 24   GLUCOSE 191* 146* 117*  BUN 19 22* 16  CREATININE 0.60 0.60 0.60  CALCIUM 9.6 9.2 9.1  GFRNONAA >60 >60 >60  PROT 6.8 6.3* 6.2*  ALBUMIN 3.7 3.5 3.3*  AST 22 24 22   ALT 35 37 41  ALKPHOS 67 62 56  BILITOT 0.6 0.4 0.3    RADIOGRAPHIC STUDIES: I have personally reviewed the radiological images as listed and agreed with the findings in the report. US Venous Img Lower Bilateral  Result Date: 07/08/2023 CLINICAL DATA:  Bilateral lower extremity pain and edema. EXAM: BILATERAL LOWER EXTREMITY VENOUS DOPPLER ULTRASOUND TECHNIQUE: Gray-scale sonography with graded compression, as well as  color Doppler and duplex ultrasound were performed to evaluate the lower extremity deep venous systems from the level of the common femoral vein and including the common femoral, femoral, profunda femoral, popliteal and calf veins including the posterior tibial, peroneal and gastrocnemius veins when visible. The superficial great saphenous vein was also interrogated. Spectral Doppler was utilized to evaluate flow at rest and with distal augmentation maneuvers in the common femoral, femoral and popliteal veins. COMPARISON:  None Available. FINDINGS: RIGHT LOWER EXTREMITY Common Femoral Vein: No evidence of thrombus. Normal compressibility, respiratory phasicity and response to augmentation. Saphenofemoral Junction: No evidence of thrombus. Normal compressibility and flow on color Doppler imaging. Profunda Femoral Vein: No evidence of thrombus. Normal compressibility and flow on color Doppler imaging. Femoral Vein: No evidence of thrombus. Normal compressibility, respiratory phasicity and response to augmentation. Popliteal Vein: No evidence of thrombus. Normal compressibility, respiratory phasicity and response to augmentation. Calf Veins: No evidence of thrombus. Normal compressibility and flow on color Doppler imaging. Superficial Great Saphenous Vein: No evidence of thrombus. Normal compressibility. Venous Reflux:  None. Other Findings: No evidence of superficial thrombophlebitis or abnormal fluid collection. LEFT LOWER EXTREMITY Common Femoral Vein: No evidence of thrombus. Normal compressibility, respiratory phasicity and response to augmentation. Saphenofemoral Junction: No evidence of thrombus. Normal compressibility and flow on color Doppler imaging. Profunda Femoral Vein: No evidence of thrombus. Normal compressibility and flow on color Doppler imaging. Femoral Vein: No evidence  of thrombus. Normal compressibility, respiratory phasicity and response to augmentation. Popliteal Vein: No evidence of thrombus.  Normal compressibility, respiratory phasicity and response to augmentation. Calf Veins: No evidence of thrombus. Normal compressibility and flow on color Doppler imaging. Superficial Great Saphenous Vein: No evidence of thrombus. Normal compressibility. Venous Reflux:  None. Other Findings: No evidence of superficial thrombophlebitis or abnormal fluid collection. IMPRESSION: No evidence of deep venous thrombosis in either lower extremity. Electronically Signed   By: Irish Lack M.D.   On: 07/08/2023 09:48   MM LT RADIO FREQUENCY TAG LOC MAMMO GUIDE  Result Date: 06/30/2023 CLINICAL DATA:  Patient is status post ultrasound-guided biopsy of a LEFT breast mass in February 2024 which demonstrated [facility] (COIL clip). Mass demonstrated adjacent calcifications which are also suspicious for malignancy and for which surgical excision is also recommended. Patient underwent ultrasound-guided biopsy of an axillary lymph node which demonstrated nodal disease (HYDROMARK). Patient underwent neoadjuvant chemotherapy with excellent response. Patient presents for preoperative localization of the COIL and adjacent calcifications as well as the sampled LEFT axillary lymph node. EXAM: NEEDLE LOCALIZATION OF THE LEFT BREAST WITH ULTRASOUND GUIDANCE NEEDLE LOCALIZATION OF THE LEFT BREAST WITH MAMMO GUIDANCE COMPARISON:  Previous exam(s). FINDINGS: Patient presents for needle localization prior to lumpectomy. I met with the patient and we discussed the procedure of needle localization including benefits and alternatives. We discussed the high likelihood of a successful procedure. We discussed the risks of the procedure, including infection, bleeding, tissue injury, and further surgery. Informed, written consent was given. The usual time-out protocol was performed immediately prior to the procedure. Targeted ultrasound was performed of the LEFT axilla. HYDROMARK clip is noted to be immediately adjacent to a now benign appearing LEFT  axillary lymph node. As such, this conglomeration of HYDROMARK clip and lymph node was targeted with ultrasound guidance. Using ultrasound guidance, sterile technique, 1% lidocaine and a 7.5 cm SAVI SCOUT needle, the HYDROMARK and adjacent lymph node was localized using an inferior approach. The images were marked for Dr. Tonna Boehringer. Patient presents for needle localization prior to lumpectomy. I met with the patient and we discussed the procedure of needle localization including benefits and alternatives. We discussed the high likelihood of a successful procedure. We discussed the risks of the procedure, including infection, bleeding, tissue injury, and further surgery. Informed, written consent was given. The usual time-out protocol was performed immediately prior to the procedure. Discussion was held with Dr. Tonna Boehringer prior to localization. Decision was made to place SAVI scout within the calcifications closely adjacent to the COIL biopsy marking clip. Using tomosynthesis stereotactic guidance, sterile technique, 1% lidocaine and a 10 cm cm SAVI SCOUT needle, the calcifications adjacent to the COIL clip were localized using a superior approach. The images were marked for Dr. Tonna Boehringer. IMPRESSION: 1. Radar reflector localization of the LEFT breast and LEFT axilla. No apparent complications. 2. SAVI is located in the highly suspicious calcifications approximately 6 mm lateral and 5 mm anterior from the COIL biopsy marking clip. Additional calcifications extend predominately anteriorly and laterally from the SAVI scout. 3. HYDROMARK clip appears to be immediately adjacent to the biopsied lymph node and was at least partially extruded during neoadjuvant chemotherapy. Electronically Signed   By: Meda Klinefelter M.D.   On: 06/30/2023 17:03   [place] LT RADIO FREQUENCY TAG EA ADD LESION LOC US GUIDE  Result Date: 06/30/2023 CLINICAL DATA:  Patient is status post ultrasound-guided biopsy of a LEFT breast mass in February 2024  which demonstrated [facility] (COIL clip).  Mass demonstrated adjacent calcifications which are also suspicious for malignancy and for which surgical excision is also recommended. Patient underwent ultrasound-guided biopsy of an axillary lymph node which demonstrated nodal disease (HYDROMARK). Patient underwent neoadjuvant chemotherapy with excellent response. Patient presents for preoperative localization of the COIL and adjacent calcifications as well as the sampled LEFT axillary lymph node. EXAM: NEEDLE LOCALIZATION OF THE LEFT BREAST WITH ULTRASOUND GUIDANCE NEEDLE LOCALIZATION OF THE LEFT BREAST WITH MAMMO GUIDANCE COMPARISON:  Previous exam(s). FINDINGS: Patient presents for needle localization prior to lumpectomy. I met with the patient and we discussed the procedure of needle localization including benefits and alternatives. We discussed the high likelihood of a successful procedure. We discussed the risks of the procedure, including infection, bleeding, tissue injury, and further surgery. Informed, written consent was given. The usual time-out protocol was performed immediately prior to the procedure. Targeted ultrasound was performed of the LEFT axilla. HYDROMARK clip is noted to be immediately adjacent to a now benign appearing LEFT axillary lymph node. As such, this conglomeration of HYDROMARK clip and lymph node was targeted with ultrasound guidance. Using ultrasound guidance, sterile technique, 1% lidocaine and a 7.5 cm SAVI SCOUT needle, the HYDROMARK and adjacent lymph node was localized using an inferior approach. The images were marked for Dr. Tonna Boehringer. Patient presents for needle localization prior to lumpectomy. I met with the patient and we discussed the procedure of needle localization including benefits and alternatives. We discussed the high likelihood of a successful procedure. We discussed the risks of the procedure, including infection, bleeding, tissue injury, and further surgery. Informed, written  consent was given. The usual time-out protocol was performed immediately prior to the procedure. Discussion was held with Dr. Tonna Boehringer prior to localization. Decision was made to place SAVI scout within the calcifications closely adjacent to the COIL biopsy marking clip. Using tomosynthesis stereotactic guidance, sterile technique, 1% lidocaine and a 10 cm cm SAVI SCOUT needle, the calcifications adjacent to the COIL clip were localized using a superior approach. The images were marked for Dr. Tonna Boehringer. IMPRESSION: 1. Radar reflector localization of the LEFT breast and LEFT axilla. No apparent complications. 2. SAVI is located in the highly suspicious calcifications approximately 6 mm lateral and 5 mm anterior from the COIL biopsy marking clip. Additional calcifications extend predominately anteriorly and laterally from the SAVI scout. 3. HYDROMARK clip appears to be immediately adjacent to the biopsied lymph node and was at least partially extruded during neoadjuvant chemotherapy. Electronically Signed   By: Meda Klinefelter M.D.   On: 06/30/2023 17:03

## 2023-07-20 ENCOUNTER — Other Ambulatory Visit: Payer: Self-pay

## 2023-07-20 ENCOUNTER — Encounter
Admission: RE | Admit: 2023-07-20 | Discharge: 2023-07-20 | Disposition: A | Discharge status: Home / Self Care | Payer: Managed Care, Other (non HMO) | Source: Ambulatory Visit | Attending: Surgery | Admitting: Surgery

## 2023-07-20 VITALS — Ht 66.0 in | Wt 200.0 lb

## 2023-07-20 DIAGNOSIS — Z01812 Encounter for preprocedural laboratory examination: Secondary | ICD-10-CM

## 2023-07-20 HISTORY — DX: Mixed hyperlipidemia: E78.2

## 2023-07-20 HISTORY — DX: Dyspnea, unspecified: R06.00

## 2023-07-20 HISTORY — DX: Acquired stenosis of left nasolacrimal duct: H04.552

## 2023-07-20 HISTORY — DX: Toxic gastroenteritis and colitis: K52.1

## 2023-07-20 HISTORY — DX: Palpitations: R00.2

## 2023-07-20 HISTORY — DX: Adverse effect of antineoplastic and immunosuppressive drugs, initial encounter: T45.1X5A

## 2023-07-20 HISTORY — DX: Hypomagnesemia: E83.42

## 2023-07-20 HISTORY — DX: Adverse effect of antineoplastic and immunosuppressive drugs, initial encounter: D64.81

## 2023-07-20 HISTORY — DX: Epiphora due to insufficient drainage, left side: H04.222

## 2023-07-20 HISTORY — DX: Other specified soft tissue disorders: M79.89

## 2023-07-20 HISTORY — DX: Elevated white blood cell count, unspecified: D72.829

## 2023-07-20 HISTORY — DX: Anxiety disorder, unspecified: F41.9

## 2023-07-20 HISTORY — DX: Drug-induced polyneuropathy: G62.0

## 2023-07-20 NOTE — Patient Instructions (Addendum)
Your procedure is scheduled on: Thursday, August 15 Report to the Registration Desk on the 1st floor of the [facility] To find out your arrival time, please call [phone] between 1PM - 3PM on: Wednesday , August 14 If your arrival time is 6:00 am, do not arrive before that time as the [facility] entrance doors do not open until 6:00 am.  REMEMBER: Instructions that are not followed completely may result in serious medical risk, up to and including death; or upon the discretion of your surgeon and anesthesiologist your surgery may need to be rescheduled.  Do not eat food after midnight the night before surgery.  No gum chewing or hard candies.  You may however, drink CLEAR liquids up to 2 hours before you are scheduled to arrive for your surgery. Do not drink anything within 2 hours of your scheduled arrival time.  Clear liquids include: - water  - apple juice without pulp - gatorade (not RED colors) - black coffee or tea (Do NOT add milk or creamers to the coffee or tea) Do NOT drink anything that is not on this list.   One week prior to surgery: Stop Anti-inflammatories (NSAIDS) such as Advil, Aleve, Ibuprofen, Motrin, Naproxen, Naprosyn and Aspirin based products such as Excedrin, Goody's Powder, BC Powder. Stop ANY OVER THE COUNTER supplements until after surgery. You may however, continue to take Tylenol if needed for pain up until the day of surgery.   TAKE ONLY THESE MEDICATIONS THE MORNING OF SURGERY WITH A SIP OF WATER:  ALPRAZolam Prudy Feeler)  if needed for anxiety  DULoxetine (CYMBALTA)  escitalopram (LEXAPRO)  metoprolol succinate (TOPROL-XL)   No Alcohol for 24 hours before or after surgery.  No Smoking including e-cigarettes for 24 hours before surgery.  No chewable tobacco products for at least 6 hours before surgery.  No nicotine patches on the day of surgery.  Do not use any "recreational" drugs for at least a week (preferably 2 weeks) before your  surgery.  Please be advised that the combination of cocaine and anesthesia may have negative outcomes, up to and including death. If you test positive for cocaine, your surgery will be cancelled.  On the morning of surgery brush your teeth with toothpaste and water, you may rinse your mouth with mouthwash if you wish. Do not swallow any toothpaste or mouthwash.  Use CHG Soap as directed on instruction sheet.  Do not wear jewelry, make-up, hairpins, clips or nail polish.  Do not wear lotions, powders, or perfumes.   Do not shave body hair from the neck down 48 hours before surgery.  Do not bring valuables to the hospital. [facility] is not responsible for any missing/lost belongings or valuables.   Notify your doctor if there is any change in your medical condition (cold, fever, infection).  Wear comfortable clothing (specific to your surgery type) to the hospital.  After surgery, you can help prevent lung complications by doing breathing exercises.  Take deep breaths and cough every 1-2 hours.   If you are being discharged the day of surgery, you will not be allowed to drive home. You will need a responsible individual to drive you home and stay with you for 24 hours after surgery.   If you are taking public transportation, you will need to have a responsible individual with you.  Please call the Pre-admissions Testing Dept. at [phone] if you have any questions about these instructions.  Surgery Visitation Policy:  Patients having surgery or a  procedure may have two visitors.  Children under the age of 45 must have an adult with them who is not the patient.       Preparing for Surgery with CHLORHEXIDINE GLUCONATE (CHG) Soap  Chlorhexidine Gluconate (CHG) Soap  o An antiseptic cleaner that kills germs and bonds with the skin to continue killing germs even after washing  o Used for showering the night before surgery and morning of surgery  Before surgery, you  can play an important role by reducing the number of germs on your skin.  CHG (Chlorhexidine gluconate) soap is an antiseptic cleanser which kills germs and bonds with the skin to continue killing germs even after washing.  Please do not use if you have an allergy to CHG or antibacterial soaps. If your skin becomes reddened/irritated stop using the CHG.  1. Shower the NIGHT BEFORE SURGERY and the MORNING OF SURGERY with CHG soap.  2. If you choose to wash your hair, wash your hair first as usual with your normal shampoo.  3. After shampooing, rinse your hair and body thoroughly to remove the shampoo.  4. Use CHG as you would any other liquid soap. You can apply CHG directly to the skin and wash gently with a scrungie or a clean washcloth.  5. Apply the CHG soap to your body only from the neck down. Do not use on open wounds or open sores. Avoid contact with your eyes, ears, mouth, and genitals (private parts). Wash face and genitals (private parts) with your normal soap.  6. Wash thoroughly, paying special attention to the area where your surgery will be performed.  7. Thoroughly rinse your body with warm water.  8. Do not shower/wash with your normal soap after using and rinsing off the CHG soap.  9. Pat yourself dry with a clean towel.  10. Wear clean pajamas to bed the night before surgery.  12. Place clean sheets on your bed the night of your first shower and do not sleep with pets.  13. Shower again with the CHG soap on the day of surgery prior to arriving at the hospital.  14. Do not apply any deodorants/lotions/powders.  15. Please wear clean clothes to the hospital.

## 2023-07-23 ENCOUNTER — Other Ambulatory Visit: Payer: Self-pay | Admitting: Internal Medicine

## 2023-07-23 DIAGNOSIS — F909 Attention-deficit hyperactivity disorder, unspecified type: Secondary | ICD-10-CM

## 2023-07-25 MED ORDER — AMPHETAMINE-DEXTROAMPHETAMINE 20 MG PO TABS
20.0000 mg | ORAL_TABLET | Freq: Two times a day (BID) | ORAL | 0 refills | Status: DC
Start: 2023-07-25 — End: 2023-08-22

## 2023-07-26 ENCOUNTER — Ambulatory Visit
Admission: RE | Admit: 2023-07-26 | Discharge: 2023-07-26 | Disposition: A | Discharge status: Home / Self Care | Payer: Managed Care, Other (non HMO) | Source: Ambulatory Visit | Attending: Internal Medicine | Admitting: Internal Medicine

## 2023-07-26 DIAGNOSIS — Z171 Estrogen receptor negative status [ER-]: Secondary | ICD-10-CM | POA: Diagnosis present

## 2023-07-26 DIAGNOSIS — C50912 Malignant neoplasm of unspecified site of left female breast: Secondary | ICD-10-CM | POA: Insufficient documentation

## 2023-07-26 MED ORDER — IOHEXOL 300 MG/ML  SOLN
75.0000 mL | Freq: Once | INTRAMUSCULAR | Status: AC | PRN
  Administered 2023-07-26: 75 mL via INTRAVENOUS

## 2023-07-27 MED ORDER — LACTATED RINGERS IV SOLN: INTRAVENOUS | Status: DC

## 2023-07-27 MED ORDER — CEFAZOLIN SODIUM-DEXTROSE 2-4 GM/100ML-% IV SOLN
2.0000 g | INTRAVENOUS | Status: AC
  Administered 2023-07-28: 2 g via INTRAVENOUS

## 2023-07-27 MED ORDER — CHLORHEXIDINE GLUCONATE CLOTH 2 % EX PADS: 6.0000 | MEDICATED_PAD | Freq: Once | CUTANEOUS | Status: DC

## 2023-07-27 MED ORDER — ORAL CARE MOUTH RINSE: 15.0000 mL | Freq: Once | OROMUCOSAL | Status: AC

## 2023-07-27 MED ORDER — CHLORHEXIDINE GLUCONATE 0.12 % MT SOLN
15.0000 mL | Freq: Once | OROMUCOSAL | Status: AC
  Administered 2023-07-28: 15 mL via OROMUCOSAL

## 2023-07-27 MED ORDER — FAMOTIDINE 20 MG PO TABS
20.0000 mg | ORAL_TABLET | Freq: Once | ORAL | Status: AC
  Administered 2023-07-28: 20 mg via ORAL

## 2023-07-28 ENCOUNTER — Encounter
Admission: RE | Admit: 2023-07-28 | Discharge: 2023-07-28 | Disposition: A | Discharge status: Home / Self Care | Payer: Managed Care, Other (non HMO) | Source: Ambulatory Visit | Attending: Surgery | Admitting: Surgery

## 2023-07-28 ENCOUNTER — Other Ambulatory Visit: Payer: Self-pay

## 2023-07-28 ENCOUNTER — Encounter
Admission: RE | Disposition: A | Discharge status: Home / Self Care | Payer: Self-pay | Source: Home / Self Care | Attending: Surgery

## 2023-07-28 ENCOUNTER — Ambulatory Visit
Admission: RE | Admit: 2023-07-28 | Discharge: 2023-07-28 | Disposition: A | Discharge status: Home / Self Care | Payer: Managed Care, Other (non HMO) | Source: Ambulatory Visit | Attending: Surgery | Admitting: Surgery

## 2023-07-28 ENCOUNTER — Encounter: Payer: Self-pay | Admitting: Surgery

## 2023-07-28 ENCOUNTER — Ambulatory Visit
Admission: RE | Admit: 2023-07-28 | Discharge: 2023-07-28 | Disposition: A | Discharge status: Home / Self Care | Payer: Managed Care, Other (non HMO) | Attending: Surgery | Admitting: Surgery

## 2023-07-28 ENCOUNTER — Ambulatory Visit: Payer: Managed Care, Other (non HMO) | Admitting: Certified Registered"

## 2023-07-28 DIAGNOSIS — Z87891 Personal history of nicotine dependence: Secondary | ICD-10-CM | POA: Insufficient documentation

## 2023-07-28 DIAGNOSIS — C50412 Malignant neoplasm of upper-outer quadrant of left female breast: Secondary | ICD-10-CM

## 2023-07-28 DIAGNOSIS — M792 Neuralgia and neuritis, unspecified: Secondary | ICD-10-CM | POA: Diagnosis not present

## 2023-07-28 DIAGNOSIS — C50912 Malignant neoplasm of unspecified site of left female breast: Secondary | ICD-10-CM

## 2023-07-28 DIAGNOSIS — C773 Secondary and unspecified malignant neoplasm of axilla and upper limb lymph nodes: Secondary | ICD-10-CM | POA: Diagnosis not present

## 2023-07-28 DIAGNOSIS — Z9221 Personal history of antineoplastic chemotherapy: Secondary | ICD-10-CM | POA: Diagnosis not present

## 2023-07-28 DIAGNOSIS — Z01812 Encounter for preprocedural laboratory examination: Secondary | ICD-10-CM

## 2023-07-28 HISTORY — PX: BREAST LUMPECTOMY,RADIO FREQ LOCALIZER,AXILLARY SENTINEL LYMPH NODE BIOPSY: SHX6900

## 2023-07-28 LAB — POCT PREGNANCY, URINE: Preg Test, Ur: NEGATIVE

## 2023-07-28 SURGERY — BREAST LUMPECTOMY,RADIO FREQ LOCALIZER,AXILLARY SENTINEL LYMPH NODE BIOPSY
Anesthesia: General | Site: Breast | Laterality: Left

## 2023-07-28 MED ORDER — ACETAMINOPHEN 325 MG PO TABS: 650.0000 mg | ORAL_TABLET | Freq: Three times a day (TID) | ORAL | 0 refills | Status: AC | PRN

## 2023-07-28 MED ORDER — TECHNETIUM TC 99M TILMANOCEPT KIT
1.0000 | PACK | Freq: Once | INTRAVENOUS | Status: AC
  Administered 2023-07-28: 1.01 via INTRADERMAL

## 2023-07-28 MED ORDER — ACETAMINOPHEN 10 MG/ML IV SOLN: 1000.0000 mg | Freq: Once | INTRAVENOUS | Status: DC | PRN

## 2023-07-28 MED ORDER — DOCUSATE SODIUM 100 MG PO CAPS: 100.0000 mg | ORAL_CAPSULE | Freq: Two times a day (BID) | ORAL | 0 refills | Status: AC | PRN

## 2023-07-28 MED ORDER — DEXAMETHASONE SODIUM PHOSPHATE 10 MG/ML IJ SOLN
INTRAMUSCULAR | Status: DC | PRN
  Administered 2023-07-28: 10 mg via INTRAVENOUS

## 2023-07-28 MED ORDER — FENTANYL CITRATE (PF) 100 MCG/2ML IJ SOLN
INTRAMUSCULAR | Status: DC | PRN
  Administered 2023-07-28: 50 ug via INTRAVENOUS
  Administered 2023-07-28: 100 ug via INTRAVENOUS
  Administered 2023-07-28: 50 ug via INTRAVENOUS

## 2023-07-28 MED ORDER — OXYCODONE HCL 5 MG PO TABS: 2.5000 mg | ORAL_TABLET | Freq: Four times a day (QID) | ORAL | 0 refills | Status: DC | PRN

## 2023-07-28 MED ORDER — PROMETHAZINE HCL 25 MG/ML IJ SOLN: 6.2500 mg | INTRAMUSCULAR | Status: DC | PRN

## 2023-07-28 MED ORDER — CHLORHEXIDINE GLUCONATE 0.12 % MT SOLN
OROMUCOSAL | Status: AC
  Filled 2023-07-28: qty 15

## 2023-07-28 MED ORDER — METHYLENE BLUE (ANTIDOTE) 1 % IV SOLN
INTRAVENOUS | Status: AC
  Filled 2023-07-28: qty 10

## 2023-07-28 MED ORDER — FENTANYL CITRATE (PF) 100 MCG/2ML IJ SOLN
25.0000 ug | INTRAMUSCULAR | Status: DC | PRN
  Administered 2023-07-28: 25 ug via INTRAVENOUS

## 2023-07-28 MED ORDER — MIDAZOLAM HCL 2 MG/2ML IJ SOLN
INTRAMUSCULAR | Status: AC
  Filled 2023-07-28: qty 2

## 2023-07-28 MED ORDER — ACETAMINOPHEN 500 MG PO TABS
1000.0000 mg | ORAL_TABLET | Freq: Once | ORAL | Status: AC
  Administered 2023-07-28: 1000 mg via ORAL

## 2023-07-28 MED ORDER — FENTANYL CITRATE (PF) 100 MCG/2ML IJ SOLN
INTRAMUSCULAR | Status: AC
  Filled 2023-07-28: qty 2

## 2023-07-28 MED ORDER — BUPIVACAINE-EPINEPHRINE 0.5% -1:200000 IJ SOLN: INTRAMUSCULAR | Status: DC | PRN

## 2023-07-28 MED ORDER — OXYCODONE HCL 5 MG PO TABS
ORAL_TABLET | ORAL | Status: AC
  Filled 2023-07-28: qty 1

## 2023-07-28 MED ORDER — PROPOFOL 10 MG/ML IV BOLUS
INTRAVENOUS | Status: DC | PRN
  Administered 2023-07-28: 150 ug/kg/min via INTRAVENOUS
  Administered 2023-07-28: 180 mg via INTRAVENOUS

## 2023-07-28 MED ORDER — KETAMINE HCL 10 MG/ML IJ SOLN
INTRAMUSCULAR | Status: DC | PRN
  Administered 2023-07-28 (×3): 10 mg via INTRAVENOUS

## 2023-07-28 MED ORDER — BUPIVACAINE HCL (PF) 0.5 % IJ SOLN
INTRAMUSCULAR | Status: AC
  Filled 2023-07-28: qty 30

## 2023-07-28 MED ORDER — METOPROLOL TARTRATE 5 MG/5ML IV SOLN
INTRAVENOUS | Status: DC | PRN
Start: 2023-07-28 — End: 2023-07-28
  Administered 2023-07-28: 1 mg via INTRAVENOUS
  Administered 2023-07-28 (×2): 2 mg via INTRAVENOUS

## 2023-07-28 MED ORDER — STERILE WATER FOR IRRIGATION IR SOLN
Status: DC | PRN
  Administered 2023-07-28: 500 mL

## 2023-07-28 MED ORDER — FAMOTIDINE 20 MG PO TABS
ORAL_TABLET | ORAL | Status: AC
  Filled 2023-07-28: qty 1

## 2023-07-28 MED ORDER — DEXMEDETOMIDINE HCL IN NACL 200 MCG/50ML IV SOLN
INTRAVENOUS | Status: DC | PRN
Start: 2023-07-28 — End: 2023-07-28
  Administered 2023-07-28: 4 ug via INTRAVENOUS
  Administered 2023-07-28: 8 ug via INTRAVENOUS

## 2023-07-28 MED ORDER — OXYCODONE HCL 5 MG PO TABS
5.0000 mg | ORAL_TABLET | Freq: Once | ORAL | Status: AC | PRN
  Administered 2023-07-28: 5 mg via ORAL

## 2023-07-28 MED ORDER — IBUPROFEN 800 MG PO TABS: 800.0000 mg | ORAL_TABLET | Freq: Three times a day (TID) | ORAL | 0 refills | Status: DC | PRN

## 2023-07-28 MED ORDER — SODIUM CHLORIDE FLUSH 0.9 % IV SOLN
INTRAVENOUS | Status: AC
  Filled 2023-07-28: qty 10

## 2023-07-28 MED ORDER — OXYCODONE HCL 5 MG/5ML PO SOLN: 5.0000 mg | Freq: Once | ORAL | Status: AC | PRN

## 2023-07-28 MED ORDER — CEFAZOLIN SODIUM-DEXTROSE 2-4 GM/100ML-% IV SOLN
INTRAVENOUS | Status: AC
  Filled 2023-07-28: qty 100

## 2023-07-28 MED ORDER — ACETAMINOPHEN 500 MG PO TABS
ORAL_TABLET | ORAL | Status: AC
  Filled 2023-07-28: qty 2

## 2023-07-28 MED ORDER — DROPERIDOL 2.5 MG/ML IJ SOLN: 0.6250 mg | Freq: Once | INTRAMUSCULAR | Status: DC | PRN

## 2023-07-28 MED ORDER — ONDANSETRON HCL 4 MG/2ML IJ SOLN
INTRAMUSCULAR | Status: DC | PRN
Start: 2023-07-28 — End: 2023-07-28
  Administered 2023-07-28: 4 mg via INTRAVENOUS

## 2023-07-28 MED ORDER — MIDAZOLAM HCL 2 MG/2ML IJ SOLN
INTRAMUSCULAR | Status: DC | PRN
  Administered 2023-07-28: 2 mg via INTRAVENOUS

## 2023-07-28 MED ORDER — METHYLENE BLUE (ANTIDOTE) 1 % IV SOLN
INTRAVENOUS | Status: DC | PRN
  Administered 2023-07-28: 5 mL via SUBMUCOSAL

## 2023-07-28 MED ORDER — LIDOCAINE HCL (CARDIAC) PF 100 MG/5ML IV SOSY
PREFILLED_SYRINGE | INTRAVENOUS | Status: DC | PRN
  Administered 2023-07-28: 100 mg via INTRAVENOUS

## 2023-07-28 MED ORDER — LACTATED RINGERS IV SOLN: INTRAVENOUS | Status: DC | PRN

## 2023-07-28 MED ORDER — LIDOCAINE-EPINEPHRINE 1 %-1:100000 IJ SOLN
INTRAMUSCULAR | Status: AC
  Filled 2023-07-28: qty 1

## 2023-07-28 MED ORDER — LIDOCAINE-EPINEPHRINE 1 %-1:100000 IJ SOLN
INTRAMUSCULAR | Status: DC | PRN
  Administered 2023-07-28: 42 mL

## 2023-07-28 SURGICAL SUPPLY — 42 items
ADH SKN CLS APL DERMABOND .7 (GAUZE/BANDAGES/DRESSINGS) ×1
APL PRP STRL LF DISP 70% ISPRP (MISCELLANEOUS) ×1
BINDER BREAST LRG (GAUZE/BANDAGES/DRESSINGS) IMPLANT
BINDER BREAST XLRG (GAUZE/BANDAGES/DRESSINGS) IMPLANT
BLADE PHOTON ILLUMINATED (MISCELLANEOUS) ×2 IMPLANT
BLADE SURG 15 STRL LF DISP TIS (BLADE) ×2 IMPLANT
BLADE SURG 15 STRL SS (BLADE) ×1
CHLORAPREP W/TINT 26 (MISCELLANEOUS) ×2 IMPLANT
COVER PROBE FLX POLY STRL (MISCELLANEOUS) IMPLANT
DERMABOND ADVANCED .7 DNX12 (GAUZE/BANDAGES/DRESSINGS) ×2 IMPLANT
DEVICE DUBIN SPECIMEN MAMMOGRA (MISCELLANEOUS) ×2 IMPLANT
DRAPE LAPAROTOMY 77X122 PED (DRAPES) ×2 IMPLANT
ELECT REM PT RETURN 9FT ADLT (ELECTROSURGICAL) ×1
ELECTRODE REM PT RTRN 9FT ADLT (ELECTROSURGICAL) ×2 IMPLANT
GAUZE 4X4 16PLY ~~LOC~~+RFID DBL (SPONGE) IMPLANT
GLOVE BIOGEL PI IND STRL 7.0 (GLOVE) ×2 IMPLANT
GLOVE SURG SYN 6.5 ES PF (GLOVE) ×1 IMPLANT
GLOVE SURG SYN 6.5 PF PI (GLOVE) ×2 IMPLANT
GOWN STRL REUS W/ TWL LRG LVL3 (GOWN DISPOSABLE) ×6 IMPLANT
GOWN STRL REUS W/TWL LRG LVL3 (GOWN DISPOSABLE) ×2
KIT MARKER MARGIN INK (KITS) ×2 IMPLANT
KIT TURNOVER KIT A (KITS) ×2 IMPLANT
LABEL OR SOLS (LABEL) ×2 IMPLANT
LIGHT WAVEGUIDE WIDE FLAT (MISCELLANEOUS) IMPLANT
MANIFOLD NEPTUNE II (INSTRUMENTS) ×2 IMPLANT
MARKER MARGIN CORRECT CLIP (MARKER) ×2 IMPLANT
NDL HYPO 22X1.5 SAFETY MO (MISCELLANEOUS) ×4 IMPLANT
NEEDLE HYPO 22X1.5 SAFETY MO (MISCELLANEOUS) ×2 IMPLANT
PACK BASIN MINOR ARMC (MISCELLANEOUS) ×2 IMPLANT
SHEATH BREAST BIOPSY SKIN MKR (SHEATH) ×2 IMPLANT
SUT MNCRL 4-0 (SUTURE) ×1
SUT MNCRL 4-0 27XMFL (SUTURE) ×1
SUT SILK 3 0 12 30 (SUTURE) IMPLANT
SUT VIC AB 3-0 SH 27 (SUTURE) ×1
SUT VIC AB 3-0 SH 27X BRD (SUTURE) ×2 IMPLANT
SUTURE MNCRL 4-0 27XMF (SUTURE) ×2 IMPLANT
SYR 20ML LL LF (SYRINGE) ×2 IMPLANT
SYR BULB IRRIG 60ML STRL (SYRINGE) ×2 IMPLANT
TRAP FLUID SMOKE EVACUATOR (MISCELLANEOUS) ×2 IMPLANT
TRAP NEPTUNE SPECIMEN COLLECT (MISCELLANEOUS) ×2 IMPLANT
WATER STERILE IRR 1000ML POUR (IV SOLUTION) ×2 IMPLANT
WATER STERILE IRR 500ML POUR (IV SOLUTION) ×2 IMPLANT

## 2023-07-28 NOTE — Discharge Instructions (Addendum)
Removal, Care After This sheet gives you information about how to care for yourself after your procedure. Your health care provider may also give you more specific instructions. If you have problems or questions, contact your health care provider. What can I expect after the procedure? After the procedure, it is common to have: Soreness. Bruising. Itching. Follow these instructions at home: site care Follow instructions from your health care provider about how to take care of your site. Make sure you: Wash your hands with soap and water before and after you change your bandage (dressing). If soap and water are not available, use hand sanitizer. Leave stitches (sutures), skin glue, or adhesive strips in place. These skin closures may need to stay in place for 2 weeks or longer. If adhesive strip edges start to loosen and curl up, you may trim the loose edges. Do not remove adhesive strips completely unless your health care provider tells you to do that. If the area bleeds or bruises, apply gentle pressure for 10 minutes. OK TO SHOWER IN 24HRS  Check your site every day for signs of infection. Check for: Redness, swelling, or pain. Fluid or blood. Warmth. Pus or a bad smell.  General instructions Rest and then return to your normal activities as told by your health care provider.  tylenol and advil as needed for discomfort.  Please alternate between the two every four hours as needed for pain.    Use narcotics, if prescribed, only when tylenol and motrin is not enough to control pain.  325-650mg every 8hrs to max of 3000mg/24hrs (including the 325mg in every norco dose) for the tylenol.    Advil up to 800mg per dose every 8hrs as needed for pain.   Keep all follow-up visits as told by your health care provider. This is important. Contact a health care provider if: You have redness, swelling, or pain around your site. You have fluid or blood coming from your site. Your site feels warm to  the touch. You have pus or a bad smell coming from your site. You have a fever. Your sutures, skin glue, or adhesive strips loosen or come off sooner than expected. Get help right away if: You have bleeding that does not stop with pressure or a dressing. Summary After the procedure, it is common to have some soreness, bruising, and itching at the site. Follow instructions from your health care provider about how to take care of your site. Check your site every day for signs of infection. Contact a health care provider if you have redness, swelling, or pain around your site, or your site feels warm to the touch. Keep all follow-up visits as told by your health care provider. This is important. This information is not intended to replace advice given to you by your health care provider. Make sure you discuss any questions you have with your health care provider. Document Released: 12/26/2015 Document Revised: 05/29/2018 Document Reviewed: 05/29/2018 Elsevier Interactive Patient Education  2019 [employer]  AMBULATORY SURGERY  DISCHARGE INSTRUCTIONS   The drugs that you were given will stay in your system until tomorrow so for the next 24 hours you should not:  Drive an automobile Make any legal decisions Drink any alcoholic beverage   You may resume regular meals tomorrow.  Today it is better to start with liquids and gradually work up to solid foods.  You may eat anything you prefer, but it is better to start with liquids, then soup and crackers, and gradually   work up to solid foods.   Please notify your doctor immediately if you have any unusual bleeding, trouble breathing, redness and pain at the surgery site, drainage, fever, or pain not relieved by medication.    Additional Instructions:        Please contact your physician with any problems or Same Day Surgery at [phone], Monday through Friday 6 am to 4 pm, or [facility] [place] Main number at  [phone]. 

## 2023-07-28 NOTE — Anesthesia Procedure Notes (Signed)
Procedure Name: LMA Insertion Date/Time: 07/28/2023 11:29 AM  Performed by: Chelsea Aus, CRNAPre-anesthesia Checklist: Emergency Drugs available, Patient identified, Suction available and Patient being monitored Patient Re-evaluated:Patient Re-evaluated prior to induction Oxygen Delivery Method: Circle system utilized Preoxygenation: Pre-oxygenation with 100% oxygen Induction Type: IV induction Ventilation: Mask ventilation without difficulty LMA: LMA inserted LMA Size: 4.0 Tube type: Oral Number of attempts: 1 Placement Confirmation: positive ETCO2 and breath sounds checked- equal and bilateral Tube secured with: Tape Dental Injury: Teeth and Oropharynx as per pre-operative assessment  Comments: Igel 4

## 2023-07-28 NOTE — H&P (Signed)
Subjective:   CC: Malignant neoplasm of upper-outer quadrant of left female breast, unspecified estrogen receptor status (CMS/HHS-HCC) [C50.412] HPI: Cristina Baker is a 37 y.o. female who returns for evaluation of above. S/p neoadjuvant chemo. Complete response noted at both axilla and breast. No specific concerns today.   Past Medical History: left breast CA  Past Surgical History: has a past surgical history that includes insertion port-a-cath (02/24/2023).  Family History: family history is not on file.  Social History: reports that she has never smoked. She has never been exposed to tobacco smoke. She has never used smokeless tobacco. No history on file for alcohol use and drug use.  Current Medications: has a current medication list which includes the following prescription(s): alprazolam, clobetasol, dexamethasone, dextroamphetamine-amphetamine, diphenoxylate-atropine, escitalopram oxalate, lidocaine-prilocaine, magnesium oxide, metoprolol succinate, and ondansetron.  Allergies:  Allergies as of 06/27/2023  (No Known Allergies)   ROS:  A 15 point review of systems was performed and was negative except as noted in HPI  Objective:    BP (!) 132/90  Pulse (!) 144  Ht 167.6 cm (5' 5.98")  Wt 82.1 kg (181 lb)  BMI 29.23 kg/m   Constitutional : No distress, cooperative, alert  Lymphatics/Throat: Supple with no lymphadenopathy  Respiratory: Clear to auscultation bilaterally  Cardiovascular: Regular rate and rhythm  Gastrointestinal: Soft, non-tender, non-distended, no organomegaly.  Musculoskeletal: Steady gait and movement  Skin: Cool and moist  Psychiatric: Normal affect, non-agitated, not confused  Breast: Normal appearance and no palpable abnormality in bilateral breasts and axilla. Chaperone present for exam.    LABS:  N/a   RADS: CLINICAL DATA: 37 year old female with biopsy proven invasive mammary carcinoma in the upper-outer quadrant of the left breast  and metastatic axillary adenopathy.  EXAM: DIGITAL DIAGNOSTIC UNILATERAL LEFT MAMMOGRAM WITH TOMOSYNTHESIS; ULTRASOUND LEFT BREAST LIMITED  TECHNIQUE: Left digital diagnostic mammography and breast tomosynthesis was performed.; Targeted ultrasound examination of the left breast was performed.  COMPARISON: Previous exam(s).  ACR Breast Density Category b: There are scattered areas of fibroglandular density.  FINDINGS: There is a coil shaped biopsy marker clip in the upper-outer quadrant of the left breast. There are surrounding malignant type microcalcifications. The previously described spiculated mass has resolved. There is a biopsy marker clip in the left axilla. The previously seen enlarged lymph node is no longer pathologically enlarged.  Targeted ultrasound is performed, showing an echogenic focus in the left breast at 2 o'clock 5 cm from the nipple corresponding with the biopsy marker clip. The previously seen mass in this area is no longer visualized. Sonographic evaluation of the left axilla does not show any enlarged adenopathy.  IMPRESSION: Interval resolution of the spiculated mass in the upper-outer quadrant of the left breast and pathologically enlarged left axillary lymph node. Malignant type microcalcifications and a biopsy marker clip persists in the upper-outer quadrant of the left breast.  RECOMMENDATION: Continued treatment planning of the known left breast cancer with metastatic adenopathy is recommended.  I have discussed the findings and recommendations with the patient. If applicable, a reminder letter will be sent to the patient regarding the next appointment.  BI-RADS CATEGORY 6: Known biopsy-proven malignancy.   Electronically Signed By: Baird Lyons M.D. On: 05/02/2023 15:15   Assessment:   Malignant neoplasm of upper-outer quadrant of left female breast, unspecified estrogen receptor status (CMS/HHS-HCC) [C50.412]  Plan:    1.  Malignant neoplasm of upper-outer quadrant of left female breast, unspecified estrogen receptor status (CMS/HHS-HCC) [C50.412] Discussed the risk of surgery including recurrence, chronic  pain, post-op infxn, poor/delayed wound healing, poor cosmesis, seroma, hematoma formation, and possible re-operation to address said risks. The risks of general anesthetic, if used, includes MI, CVA, sudden death or even reaction to anesthetic medications also discussed.  Typical post-op recovery time and possbility of activity restrictions were also discussed. Alternatives include continued observation. Benefits include possible symptom relief, pathologic evaluation, and/or curative excision.   The patient verbalized understanding and all questions were answered to the patient's satisfaction.  2. Patient has elected to proceed with surgical treatment. Procedure will be scheduled after next appt with oncology for lab check.  Left Lumpectomy, SLNB, SCOUT placement for BOTH axilla and breast marker. Will also use methylene blue at time of surgery to increase biopsy number, ideally >=3 sentinel nodes.  labs/images/medications/previous chart entries reviewed personally and relevant changes/updates noted above.

## 2023-07-28 NOTE — Anesthesia Preprocedure Evaluation (Addendum)
Anesthesia Evaluation  Patient identified by MRN, date of birth, ID band Patient awake    Reviewed: Allergy & Precautions, H&P , NPO status , Patient's Chart, lab work & pertinent test results, reviewed documented beta blocker date and time   History of Anesthesia Complications (+) PONV and history of anesthetic complications  Airway Mallampati: III  TM Distance: >3 FB Neck ROM: full    Dental no notable dental hx.    Pulmonary former smoker   Pulmonary exam normal        Cardiovascular Exercise Tolerance: Good Normal cardiovascular exam+ dysrhythmias (Tachycardia on Metoprolol)   Bilateral leg swelling  Access-port    Neuro/Psych  PSYCHIATRIC DISORDERS       Neuromuscular disease (nueropathy)    GI/Hepatic negative GI ROS, Neg liver ROS,,,  Endo/Other  negative endocrine ROS    Renal/GU      Musculoskeletal   Abdominal  (+) + obese  Peds  Hematology  (+) Blood dyscrasia, anemia   Anesthesia Other Findings  Right ninth rib lesion- Being monitored Past Medical History: No date: Abnormal Pap smear of cervix 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 No date: Adult ADHD No date: Antineoplastic chemotherapy induced anemia No date: Anxiety 2015: BRCA negative     Comment:  MyRisk neg; IBIS=18/8% 01/2023: Breast cancer (HCC)     Comment:  left No date: Chemotherapy 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) No date: Dyspnea No date: 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 2015: Family history of breast cancer     Comment:  IBIS=18.8% No date: Family history of ovarian cancer No date: Heart palpitations No date: Herpes genitalis No date: Hypomagnesemia No date: Leg swelling No date: Leukocytosis No date: Lichen sclerosus No date: Mixed hyperlipidemia No date: PAC (premature atrial contraction) No date: PONV (postoperative nausea and vomiting)     Comment:   nausea No date: PVC's (premature ventricular contractions) No date: Tachycardia, unspecified  Past Surgical History: 02/10/2023: BREAST BIOPSY; Left     Comment:  invasive mammary carcinoma 02/10/2023: BREAST BIOPSY; Left     Comment:  [place] Core Hydromark clip path pending 02/10/2023: BREAST BIOPSY; Left     Comment:  [place] LT BREAST BX W LOC DEV 1ST LESION IMG BX SPEC [place]               GUIDE 02/10/2023 [facility] 06/30/2023: BREAST BIOPSY; Left     Comment:  [place] LT RADIO FREQUENCY TAG EA ADD LESION LOC US GUIDE               06/30/2023 [facility] 01/03/2007: CESAREAN SECTION 03/26/2016: CESAREAN SECTION WITH BILATERAL TUBAL LIGATION; N/A     Comment:  Procedure: CESAREAN SECTION WITH BILATERAL TUBAL               LIGATION;  Surgeon: Conard Novak, MD;  Location:               [facility];  Service: Obstetrics;  Laterality: N/A; No date: COLPOSCOPY No date: IUD REMOVAL 02/24/2023: PORTACATH PLACEMENT; N/A     Comment:  Procedure: INSERTION PORT-A-CATH;  Surgeon: Sung Amabile, DO;  Location: [facility];  Service: General;                Laterality: N/A;     Reproductive/Obstetrics negative OB ROS  Anesthesia Physical Anesthesia Plan  ASA: 2  Anesthesia Plan: General   Post-op Pain Management: Minimal or no pain anticipated   Induction: Intravenous  PONV Risk Score and Plan: 3, 4 or greater and 2 and Ondansetron, Dexamethasone, Midazolam, TIVA and Propofol infusion  Airway Management Planned: LMA  Additional Equipment:   Intra-op Plan:   Post-operative Plan: Extubation in OR  Informed Consent: I have reviewed the patients History and Physical, chart, labs and discussed the procedure including the risks, benefits and alternatives for the proposed anesthesia with the patient or authorized representative who has indicated his/her understanding and acceptance.     Dental Advisory Given  Plan  Discussed with: CRNA and Surgeon  Anesthesia Plan Comments:         Anesthesia Quick Evaluation

## 2023-07-28 NOTE — Op Note (Signed)
Preoperative diagnosis: Left breast carcinoma.,   Postoperative diagnosis: Same.   Procedure: SCOUT tag-localized left breast partial mastectomy.                      Left axillary Sentinel Lymph node biopsy, excision of known nodal metastasis  Methylene blue injection for sentinel lymph node biopsy identification  Anesthesia: GETA  Surgeon: Dr. Sung Amabile  Wound Classification: Clean  Indications: Patient is a 37 y.o. female with a nonpalpable left breast mass noted on mammography with core biopsy demonstrating ductal CA.  Patient underwent neoadjuvant chemotherapy due to presence of metastatic disease to axillary node.  Most recent imaging study shows resolution of both mass and lymphadenopathy.  Patient requires SCOUT localizer placement, partial mastectomy for treatment with sentinel lymph node biopsy.   Specimen: Left breast mass, Sentinel Lymph nodes x 2, metastatic lymph node with Scout marker, posterior medial margin  Complications: None  Estimated Blood Loss:  Findings: 1. Specimen mammography shows marker and SCOUT localizer on specimen 2. Pathology call refers gross examination of margins was positive for some extensive scarring in the posterior medial margin.  These margins were removed and sent to pathology 3.  Extensive arterial bleeding noted during dissection of lymph node, requiring suture ligation.  No persistent bleeding noted afterwards.  Operation performed with curative intent:Yes  Tracer(s) used to identify sentinel nodes in the upfront surgery (non-neoadjuvant) setting (select all that apply):N/A  Tracer(s) used to identify sentinel nodes in the neoadjuvant setting (select all that apply):Dye and Radioactive Tracer  All nodes (colored or non-colored) present at the end of a dye-filled lymphatic channel were removed:Yes   All significantly radioactive nodes were removed:Yes  All palpable suspicious nodes were removed:Yes  Biopsy-proven positive  nodes marked with clips prior to chemotherapy were identified and removed: Node adjacent to Scout tag was removed, could not identify the initial Hydro marker within the nodal field.  Please see description of procedure below for details.   Description of procedure: SCOUT localization was performed by radiology prior to procedure. In the nuclear medicine suite, the subareolar region was injected with Tc-99 sulfur colloid the morning of procedure. Localization studies were reviewed. The patient was taken to the operating room and placed supine on the operating table, and after general anesthesia, methylene blue 10 mL total was injected around the nipple areolar complex and extensive massaging was completed for 5 minutes.    The left breast and axilla were prepped and draped in the usual sterile fashion. A time-out was completed verifying correct patient, procedure, site, positioning, and implant(s) and/or special equipment prior to beginning this procedure.  By identifying the SCOUT localizer, the probable trajectory and location of the mass was visualized. A skin incision was planned in such a way as to minimize the amount of dissection to reach the mass.  The skin incision was made after infusion of local. Flaps were raised and  Sharp and blunt dissection was then taken down to the mass, taking care to include the entire SCOUT localizer and a margin of grossly normal tissue. The specimen was removed. The specimen was oriented with paint. Imaging reviewed and the entire target lesion had been resected, with biopsy clip and localizer within the specimen.Gross margin analysis by pathology confirmed posterior medial aspect with some scarring but no obvious malignant cells.  Additional margin at the posterior medial aspect removed to ensure no further issues.  Margins were painted and passed off field pending pathology.  A hand-held gamma probe was used to identify the location of the hottest spot in the  axilla. An incision was made around the caudal axillary hairline. Sharp and blunt Dissection was carried down to subdermal facias. The probe was placed within wound and again, the point of maximal count was found. Dissection continue until nodule was identified. The probe was placed in contact with the node and 2300 counts were recorded. The node was excised in its entirety. Ex vivo, the node measured 2350 counts when placed on the probe. The bed of the node measured 100 counts. An additional hot spot was detected and the node was excised in similar fashion. No additional hot spots were identified. A clinically abnormal nodes palpated and the [place] scout detector noted the localizer to be within this area.  During attempted dissection of this area, significant arterial bleeding was noted deep to the area that was being dissected.  Hemostatic control eventually achieved by identifying the injured artery and ligating with 3-0 silk.  No further bleeding noted after successful suture ligation.  The palpable lymph node along with the scout localizer was then successfully removed from the axillary bed.  Bedside right mammography noted scout localizer in place but no sign of a Hydro marker.  Radiology confirmed no signs.  Ultrasound brought into room and extensive search for the [facility] marker under ultrasound guidance performed within the axillary bed, but no evidence of an obvious Hydro marker was noted.  No additional hotspots noted either.  Decision made at this point to terminate the procedure and await final pathology of the lymph nodes, in the hopes that the lymph node adjacent to the [facility] scout will show signs of previous biopsy.  Review of previous imaging at the time of Scout tag placement did confirm tag in [place] marker clip was within proximity to each other.   Both wounds irrigated, hemostasis was achieved and the wound closed in layers with  interrupted sutures of 3-0 Vicryl in deep dermal layer and a  running subcuticular suture of Monocryl 4-0, then dressed with dermabond. The patient tolerated the procedure well and was taken to the postanesthesia care unit in stable condition. Sponge and instrument count correct at end of procedure.

## 2023-07-28 NOTE — Interval H&P Note (Signed)
No change. OK to proceed.

## 2023-07-28 NOTE — Transfer of Care (Signed)
Immediate Anesthesia Transfer of Care Note  Patient: Cristina Baker  Procedure(s) Performed: BREAST LUMPECTOMY,RADIO FREQ LOCALIZER,AXILLARY SENTINEL LYMPH NODE BIOPSY (Left: Breast)  Patient Location: PACU  Anesthesia Type:General  Level of Consciousness: awake, alert , and oriented  Airway & Oxygen Therapy: Patient Spontanous Breathing  Post-op Assessment: Report given to RN and Post -op Vital signs reviewed and stable  Post vital signs: stable  Last Vitals:  Vitals Value Taken Time  BP 108/76 07/28/23 1349  Temp    Pulse 90 07/28/23 1351  Resp 15 07/28/23 1351  SpO2 93 % 07/28/23 1351  Vitals shown include unfiled device data.  Last Pain:  Vitals:   07/28/23 0843  TempSrc: Temporal  PainSc: 0-No pain         Complications: No notable events documented.

## 2023-07-29 NOTE — Anesthesia Postprocedure Evaluation (Signed)
Anesthesia Post Note  Patient: Cristina Baker  Procedure(s) Performed: BREAST LUMPECTOMY,RADIO FREQ LOCALIZER,AXILLARY SENTINEL LYMPH NODE BIOPSY (Left: Breast)  Patient location during evaluation: PACU Anesthesia Type: General Level of consciousness: awake and alert Pain management: pain level controlled Vital Signs Assessment: post-procedure vital signs reviewed and stable Respiratory status: spontaneous breathing, nonlabored ventilation and respiratory function stable Cardiovascular status: blood pressure returned to baseline and stable Postop Assessment: no apparent nausea or vomiting Anesthetic complications: no   There were no known notable events for this encounter.   Last Vitals:  Vitals:   07/28/23 1430 07/28/23 1434  BP: 122/76 122/83  Pulse: 92 90  Resp: 14 16  Temp:  36.5 C  SpO2: 96% 95%    Last Pain:  Vitals:   07/29/23 1413  TempSrc:   PainSc: 4                  Foye Deer

## 2023-08-02 ENCOUNTER — Ambulatory Visit: Payer: Managed Care, Other (non HMO) | Admitting: Internal Medicine

## 2023-08-05 ENCOUNTER — Encounter: Payer: Self-pay | Admitting: Internal Medicine

## 2023-08-05 ENCOUNTER — Inpatient Hospital Stay (HOSPITAL_BASED_OUTPATIENT_CLINIC_OR_DEPARTMENT_OTHER): Payer: Managed Care, Other (non HMO) | Admitting: Internal Medicine

## 2023-08-05 VITALS — BP 118/93 | HR 104 | Temp 98.6°F | Wt 204.0 lb

## 2023-08-05 DIAGNOSIS — Z171 Estrogen receptor negative status [ER-]: Secondary | ICD-10-CM

## 2023-08-05 DIAGNOSIS — C50412 Malignant neoplasm of upper-outer quadrant of left female breast: Secondary | ICD-10-CM | POA: Diagnosis not present

## 2023-08-05 DIAGNOSIS — C50912 Malignant neoplasm of unspecified site of left female breast: Secondary | ICD-10-CM | POA: Diagnosis not present

## 2023-08-05 NOTE — Progress Notes (Addendum)
 DISCONTINUE ON PATHWAY REGIMEN - Breast     Cycle 1: A cycle is 21 days:     Pertuzumab      Trastuzumab-xxxx      Docetaxel      Carboplatin    Cycles 2 through 6: A cycle is every 21 days:     Pertuzumab      Trastuzumab-xxxx      Docetaxel      Carboplatin   **Always confirm dose/schedule in your pharmacy ordering system**  REASON: Other Reason PRIOR TREATMENT: [id number]: Docetaxel + Carboplatin + Trastuzumab IV + Pertuzumab IV (TCHP IV) q21 Days x 6 Cycles TREATMENT RESPONSE: Complete Response (CR)  START ON PATHWAY REGIMEN - Breast     A cycle is every 21 days:     Trastuzumab-xxxx      Pertuzumab   **Always confirm dose/schedule in your pharmacy ordering system**  Patient Characteristics: Post-Neoadjuvant Therapy and Resection, M0, HER2 Positive, ER Negative, No Residual Disease, Adjuvant Targeted Therapy After Neoadjuvant Chemo/Targeted Therapy Therapeutic Status: Post-Neoadjuvant Therapy and Resection, M0 Residual Invasive Disease Post-Neoadjuvant Therapy<= No ER Status: Negative (-) HER2 Status: Positive (+) PR Status: Negative (-) Intent of Therapy: Curative Intent, Discussed with Patient

## 2023-08-05 NOTE — Progress Notes (Signed)
Patient says that she is still tender and sore from her surgery.

## 2023-08-05 NOTE — Progress Notes (Signed)
Cristina Baker  Patient Care Team: Sherron Monday, MD as PCP - General (Internal Medicine) Hulen Luster, RN as Oncology Nurse Navigator Michaelyn Barter, MD as Consulting Physician (Oncology)  CANCER STAGING   Cancer Staging  Breast cancer [facility]) Staging form: Breast, AJCC 8th Edition - Clinical: Stage IIB (cT2, cN1, cM0, G3, ER-, PR-, HER2+) - Signed by Michaelyn Barter, MD on 02/17/2023 Histologic grading system: 3 grade system   ASSESSMENT & PLAN:  Cristina Baker 37 y.o. 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  # Left breast invasive ductal cancer, ER/PR negative, HER2 positive, stage IIb -Self detected left breast mass.  Status post biopsy on 02/10/2023.  -CT chest abdomen pelvis (03/01/2023)- Ill-defined mixed focus in the posterior right ninth rib corresponding with increased radiotracer uptake on the bone scan.  Nonspecific but suspicious for osseous metastatic disease.  I discussed the finding with Dr. Archer Asa of IR who advised that the lesion is very small in size about 6 mm and is difficult to biopsy. Repeat CT chest on 04/15/2023 showed unchanged right ninth rib lesion.  - Mid treatment mammogram and ultrasound showed complete resolution of the breast mass.   -Completed neoadjuvant TCHP x 6 cycle.   - s/p left breast lumpectomy with SLNB with Dr. Tonna Boehringer on 06/27/2023.  Pathology showed path CR.  0/3 lymph nodes negative for malignancy. -Referral to radiation oncology for adjuvant radiation. -Discussed with the patient about maintenance Herceptin Perjeta for total of 1 year.  She is still recovering from the surgery.  Plan to start in 4 weeks. -Last echo from 05/30/2023 showed normal EF.  Will continue monitoring every 3 to 4 months.  # Right ninth rib lesion -Questionable.  Last CT in May 2024 showed stable 7 mm  lesion.  Case was previously discussed with Dr. Ruthe Mannan from IR and lesion was too small to biopsy. -Repeat CT chest from August 2024 showed stable right rib lesion.  Will continue to monitor. There is new 3 mm subpleural nodule in the left lower lobe likely inflammatory, surveillance suggested.  # Chemotherapy-induced neuropathy -Patient decided to hold off on Cymbalta due to interaction with Lexapro. -Her symptoms are manageable without any medication.  # Family history of breast cancer -Genetic testing was negative  # Anxiety -Xanax 0.25 mg twice daily as needed  # History of palpitations  -Reportedly, she had workup with cardiology which was negative.  It was deemed secondary to panic. -On metoprolol.  # Access-port placed by Dr. Tonna Boehringer.  Has Emla cream  Orders Placed This Encounter  Procedures   Ambulatory referral to Radiation Oncology    Standing Status:   Future    Standing Expiration Date:   08/04/2024    Referral Priority:   Routine    Referral Type:   Consultation    Referral Reason:   Specialty Services Required    Requested Specialty:   Radiation Oncology    Number of Visits Requested:   1   RTC in 3 weeks for MD visit, labs, Herceptin Perjeta  The total time spent in the appointment was 30 minutes encounter with patients including review of chart and various tests results, discussions about plan of care and coordination of care plan   All questions were answered. The patient knows to call the clinic with any problems, questions or concerns. No barriers to learning was  detected.  Michaelyn Barter, MD 8/23/202412:32 PM   HISTORY OF PRESENTING ILLNESS:  Cristina Baker 37 y.o. 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  She denies any pain or tenderness in the breast.  Denies any discharge.  There is family history of breast  cancer in paternal grandmother and 2 aunts.  She had a BRCA testing about 15 years ago which was negative.  She has completed family-planning.  Underwent tubal ligation.  She has 3 children 1 in 86s second in his teen and the youngest is 39 years old.    Interval history Patient seen today as follow-up to discuss path report. Recovering from the surgery.  She feels some soreness in her left axilla from the incision.  The breast incision is not bothering.  Her bilateral leg swelling has improved with compression stocking.  She decided to hold off on Cymbalta due to interaction with Lexapro.  She does have some neuropathy but is manageable.  I have reviewed her chart and materials related to her cancer extensively and collaborated history with the patient. Summary of oncologic history is as follows: Oncology History  Breast cancer (HCC)  01/26/2023 Initial Diagnosis   Patient felt palpable lump in left breast   02/10/2023 Mammogram     IMPRESSION: 1. There is a suspicious 24 mm mass at the site of palpable concern which is concerning for malignancy. It demonstrates 3 cm of associated calcifications. Recommend ultrasound-guided biopsy for definitive characterization with attention on post marker placement mammogram to assess for extent of calcifications in relation to the biopsy marking clip. 2. There is an indeterminate LEFT axillary lymph node. Recommend ultrasound-guided biopsy for definitive characterization. 3. No mammographic evidence of malignancy in the RIGHT breast.   02/10/2023 Pathology Results   DIAGNOSIS: A.  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  Size of invasive carcinoma: 1.7 cm in linear length in this sample Histologic grade of invasive carcinoma: Grade 3 Glandular/tubular differentiation score: 3 Nuclear pleomorphism score: 3 Mitotic rate score: 2 Total score: 8 Ductal carcinoma in situ: Present,  high-grade Lymphovascular invasion: Indeterminate See comment.  B.  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   Estrogen Receptor (ER) Status: NEGATIVE (LESS THAN 1%)         Internal control cells positive Progesterone Receptor (PgR) Status: Negative HER2 (by immunohistochemistry): Positive Ki-67: Not performed    02/17/2023 Cancer Staging   Staging form: Breast, AJCC 8th Edition - Clinical: Stage IIB (cT2, cN1, cM0, G3, ER-, PR-, HER2+) - Signed by Michaelyn Barter, MD on 02/17/2023 Histologic grading system: 3 grade system   03/07/2023 - 06/21/2023 Chemotherapy   Patient is on Treatment Plan : BREAST  Docetaxel + Carboplatin + Trastuzumab + Pertuzumab  (TCHP) q21d       Genetic Testing   Negative genetic testing. No pathogenic variants identified on the [facility] Multi-Cancer+RNA panel. The report date is 03/01/2023.  The Multi-Cancer + RNA Panel offered by [facility] includes sequencing and/or deletion/duplication analysis of the following 70 genes:  AIP*, ALK, APC*, ATM*, AXIN2*, BAP1*, BARD1*, BLM*, BMPR1A*, BRCA1*, BRCA2*, BRIP1*, CDC73*, CDH1*, CDK4, CDKN1B*, CDKN2A, CHEK2*, CTNNA1*, DICER1*, EPCAM, EGFR, FH*, FLCN*, GREM1, HOXB13, KIT, LZTR1, MAX*, MBD4, MEN1*, MET, MITF, MLH1*, MSH2*, MSH3*, MSH6*, MUTYH*, NF1*, NF2*, NTHL1*, PALB2*, PDGFRA, PMS2*, POLD1*, POLE*, POT1*, PRKAR1A*,  PTCH1*, PTEN*, RAD51C*, RAD51D*, RB1*, RET, SDHA*, SDHAF2*, SDHB*, SDHC*, SDHD*, SMAD4*, SMARCA4*, SMARCB1*, SMARCE1*, STK11*, SUFU*, TMEM127*, TP53*, TSC1*, TSC2*, VHL*. RNA analysis is performed for * genes.   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    Family history of breast cancer 2015   IBIS=18.8%   Family history of ovarian cancer    Heart palpitations    Herpes genitalis    Hypomagnesemia    Leg swelling    Leukocytosis    Lichen sclerosus    Mixed hyperlipidemia    PAC (premature atrial contraction)    PONV (postoperative nausea and vomiting)    nausea   PVC's (premature ventricular contractions)    Tachycardia, unspecified     SURGICAL HISTORY: Past Surgical History:  Procedure Laterality Date   BREAST BIOPSY Left 02/10/2023   invasive mammary carcinoma   BREAST BIOPSY Left 02/10/2023   [place] Core Hydromark clip path pending   BREAST BIOPSY Left 02/10/2023   [place] LT BREAST BX W LOC DEV 1ST LESION IMG BX SPEC US GUIDE 02/10/2023 [facility]   BREAST BIOPSY Left 06/30/2023   [place] LT RADIO FREQUENCY TAG EA ADD LESION LOC US GUIDE 06/30/2023 [facility]   BREAST LUMPECTOMY,RADIO FREQ LOCALIZER,AXILLARY SENTINEL LYMPH NODE BIOPSY Left 07/28/2023   Procedure: BREAST LUMPECTOMY,RADIO FREQ LOCALIZER,AXILLARY SENTINEL LYMPH NODE BIOPSY;  Surgeon: Sung Amabile, DO;  Location: [facility];  Service: General;  Laterality: Left;   CESAREAN SECTION  01/03/2007   CESAREAN SECTION WITH BILATERAL TUBAL LIGATION N/A 03/26/2016   Procedure: CESAREAN SECTION WITH BILATERAL TUBAL LIGATION;  Surgeon: Conard Novak, MD;  Location: [facility];  Service: Obstetrics;  Laterality: N/A;   COLPOSCOPY     IUD REMOVAL     PORTACATH PLACEMENT N/A 02/24/2023   Procedure: INSERTION PORT-A-CATH;  Surgeon: Sung Amabile, DO;  Location: [facility];  Service: General;  Laterality: N/A;    SOCIAL HISTORY: Social History   Socioeconomic History   Marital status: Married    Spouse name: Verdon Cummins   Number of children: 3   Years of  education: Not on file   Highest education level: Not on file  Occupational History   Not on file  Tobacco Use   Smoking status: Former    Current packs/day: 0.00    Types: Cigarettes    Quit date: 2022    Years since quitting: 2.6   Smokeless tobacco: Never  Vaping Use   Vaping status: Never Used  Substance and Sexual Activity   Alcohol use: Yes    Comment: rarely   Drug use: No   Sexual activity: Yes    Birth control/protection: Surgical    Comment: tubal ligation  Other Topics Concern   Not on file  Social History Narrative   Not on file   Social Determinants of Health   Financial Resource Strain: Low Risk  (04/05/2023)   Overall Financial  Resource Strain (CARDIA)    Difficulty of Paying Living Expenses: Not very hard  Food Insecurity: No Food Insecurity (04/05/2023)   Hunger Vital Sign    Worried About Running Out of Food in the Last Year: Never true    Ran Out of Food in the Last Year: Never true  Transportation Needs: No Transportation Needs (02/17/2023)   PRAPARE - Administrator, Civil Service (Medical): No    Lack of Transportation (Non-Medical): No  Physical Activity: Inactive (04/05/2023)   Exercise Vital Sign    Days of Exercise per Week: 0 days    Minutes of Exercise per Session: 0 min  Stress: Stress Concern Present (04/05/2023)   [employer] of Occupational Health - Occupational Stress Questionnaire    Feeling of Stress : To some extent  Social Connections: Socially Integrated (04/05/2023)   Social Connection and Isolation Panel [NHANES]    Frequency of Communication with Friends and Family: Three times a week    Frequency of Social Gatherings with Friends and Family: Once a week    Attends Religious Services: 1 to 4 times per year    Active Member of [employer] or Organizations: Yes    Attends Banker Meetings: 1 to 4 times per year    Marital Status: Married  Catering manager Violence: Not At Risk (02/17/2023)   Humiliation,  Afraid, Rape, and Kick questionnaire    Fear of Current or Ex-Partner: No    Emotionally Abused: No    Physically Abused: No    Sexually Abused: No    FAMILY HISTORY: Family History  Problem Relation Age of Onset   Hypercholesterolemia Mother    Diabetes Mellitus II Father    Hypertension Father    Breast cancer Paternal Aunt 71   Breast cancer Paternal Aunt 63   Breast cancer Paternal Grandmother        36s   Throat cancer Paternal Grandfather    Heart attack Paternal Grandfather     ALLERGIES:  has No Known Allergies.  MEDICATIONS:  Current Outpatient Medications  Medication Sig Dispense Refill   acetaminophen (TYLENOL) 325 MG tablet Take 2 tablets (650 mg total) by mouth every 8 (eight) hours as needed for mild pain. 40 tablet 0   ALPRAZolam (XANAX) 0.5 MG tablet USE AS DIRECTED TAKE HALF TAB TWICE DAILY FOR ANXIETY 15 tablet 2   amphetamine-dextroamphetamine (ADDERALL) 20 MG tablet Take 1 tablet (20 mg total) by mouth 2 (two) times daily. 60 tablet 0   aspirin-acetaminophen-caffeine (EXCEDRIN MIGRAINE) 250-250-65 MG tablet Take 1 tablet by mouth every 6 (six) hours as needed for headache.     clobetasol ointment (TEMOVATE) 0.05 % Apply to affected area once wkly as maintenance 45 g 0   docusate sodium (COLACE) 100 MG capsule Take 1 capsule (100 mg total) by mouth 2 (two) times daily as needed for up to 10 days for mild constipation. 20 capsule 0   escitalopram (LEXAPRO) 10 MG tablet TAKE 1 TABLET BY MOUTH EVERY DAY IN THE MORNING 90 tablet 1   ibuprofen (ADVIL) 800 MG tablet Take 1 tablet (800 mg total) by mouth every 8 (eight) hours as needed for mild pain or moderate pain. 30 tablet 0   metoprolol succinate (TOPROL-XL) 25 MG 24 hr tablet TAKE 1 TABLET BY MOUTH ONCE A DAY **MAKE FOLLOW UP APPT WITH DR. KHAN FOR REFILLS** 90 tablet 0   oxyCODONE (OXY IR/ROXICODONE) 5 MG immediate release tablet Take 0.5-1 tablets (2.5-5 mg total) by mouth  every 6 (six) hours as needed for  severe pain. 6 tablet 0   No current facility-administered medications for this visit.   Facility-Administered Medications Ordered in Other Visits  Medication Dose Route Frequency Provider Last Rate Last Admin   heparin lock flush 100 unit/mL  500 Units Intravenous Once Michaelyn Barter, MD        REVIEW OF SYSTEMS:   Pertinent information mentioned in HPI All other systems were reviewed with the patient and are negative.  PHYSICAL EXAMINATION: ECOG PERFORMANCE STATUS: 0 - Asymptomatic  Vitals:   08/05/23 1104  BP: (!) 118/93  Pulse: (!) 104  Temp: 98.6 F (37 C)  SpO2: 97%    Filed Weights   08/05/23 1104  Weight: 204 lb (92.5 kg)     GENERAL:alert, no distress and comfortable SKIN: skin color, texture, turgor are normal, no rashes or significant lesions EYES: normal, conjunctiva are pink and non-injected, sclera clear OROPHARYNX:no exudate, no erythema and lips, buccal mucosa, and tongue normal  NECK: supple, thyroid normal size, non-tender, without nodularity LYMPH:  no palpable lymphadenopathy in the cervical, axillary or inguinal LUNGS: clear to auscultation and percussion with normal breathing effort HEART: regular rate & rhythm and no murmurs and no lower extremity edema ABDOMEN:abdomen soft, non-tender and normal bowel sounds Musculoskeletal:no cyanosis of digits and no clubbing  PSYCH: alert & oriented x 3 with fluent speech NEURO: no focal motor/sensory deficits  LABORATORY DATA:  I have reviewed the data as listed Lab Results  Component Value Date   WBC 6.7 07/12/2023   HGB 9.9 (L) 07/12/2023   HCT 31.4 (L) 07/12/2023   MCV 97.8 07/12/2023   PLT 210 07/12/2023   Recent Labs    05/31/23 0901 06/21/23 0826 07/12/23 1050  NA 136 137 137  K 4.0 3.5 3.5  CL 105 106 107  CO2 23 23 24   GLUCOSE 191* 146* 117*  BUN 19 22* 16  CREATININE 0.60 0.60 0.60  CALCIUM 9.6 9.2 9.1  GFRNONAA >60 >60 >60  PROT 6.8 6.3* 6.2*  ALBUMIN 3.7 3.5 3.3*  AST 22 24  22   ALT 35 37 41  ALKPHOS 67 62 56  BILITOT 0.6 0.4 0.3    RADIOGRAPHIC STUDIES: I have personally reviewed the radiological images as listed and agreed with the findings in the report. CT Chest W Contrast  Result Date: 08/03/2023 CLINICAL DATA:  Restaging left breast cancer, chemotherapy finished on 06/21/2023. * Tracking Code: BO * EXAM: CT CHEST WITH CONTRAST TECHNIQUE: Multidetector CT imaging of the chest was performed during intravenous contrast administration. RADIATION DOSE REDUCTION: This exam was performed according to the departmental dose-optimization program which includes automated exposure control, adjustment of the mA and/or kV according to patient size and/or use of iterative reconstruction technique. CONTRAST:  75mL OMNIPAQUE IOHEXOL 300 MG/ML  SOLN COMPARISON:  Chest CT 04/15/2023, bone scan 02/21/2023 FINDINGS: Cardiovascular: Right Port-A-Cath tip: Cavoatrial junction. Mediastinum/Nodes: No pathologic adenopathy. AP window lymph node 0.8 cm in short axis on image 47 series 2, previously 0.7 cm. Lungs/Pleura: New 3 mm subpleural nodule in the left lower lobe on image 91 series 3, likely inflammatory, surveillance suggested. Fleischner criteria do not apply due to history of malignancy. Upper Abdomen: Unremarkable Musculoskeletal: Stable 8 by 4 mm focus of sclerosis in the right posterior ninth rib on image 96 series 3 which appears to correlate with the small focus of accentuated activity on 02/21/2023 bone scan. IMPRESSION: 1. New 3 mm subpleural nodule in the left lower lobe, likely  inflammatory, surveillance suggested. 2. Stable 8 x 4 mm focus of sclerosis in the right posterior ninth rib which appears to correlate with the small focus of accentuated activity on 02/21/2023 bone scan. This lesion was not readily apparent on the prior CT abdomen from 08/30/2007, osseous oligometastatic disease is a distinct possibility. No progression or change. Electronically Signed   By: Gaylyn Rong M.D.   On: 08/03/2023 08:34   MM Breast Surgical Specimen  Result Date: 07/28/2023 CLINICAL DATA:  Status post [facility] localized LEFT breast lumpectomy. EXAM: SPECIMEN RADIOGRAPH OF THE LEFT BREAST COMPARISON:  Previous exam(s). FINDINGS: Status post excision of the LEFT breast. Site 1: The [employer] and coil shaped clip are present within the specimen. Site 2: Axilla. SAVI scout reflector is present. The [facility] clip is not present within the specimen. Findings are discussed with Dr. Tonna Boehringer in the operating room. IMPRESSION: Specimen radiographs of the LEFT breast and LEFT axilla. Electronically Signed   By: Norva Pavlov M.D.   On: 07/28/2023 16:27   NM Sentinel Node Inj-No Rpt (Breast)  Result Date: 07/28/2023 Sulfur Colloid was injected by the Nuclear Medicine Technologist for sentinel lymph node localization.   US Venous Img Lower Bilateral  Result Date: 07/08/2023 CLINICAL DATA:  Bilateral lower extremity pain and edema. EXAM: BILATERAL LOWER EXTREMITY VENOUS DOPPLER ULTRASOUND TECHNIQUE: Gray-scale sonography with graded compression, as well as color Doppler and duplex ultrasound were performed to evaluate the lower extremity deep venous systems from the level of the common femoral vein and including the common femoral, femoral, profunda femoral, popliteal and calf veins including the posterior tibial, peroneal and gastrocnemius veins when visible. The superficial great saphenous vein was also interrogated. Spectral Doppler was utilized to evaluate flow at rest and with distal augmentation maneuvers in the common femoral, femoral and popliteal veins. COMPARISON:  None Available. FINDINGS: RIGHT LOWER EXTREMITY Common Femoral Vein: No evidence of thrombus. Normal compressibility, respiratory phasicity and response to augmentation. Saphenofemoral Junction: No evidence of thrombus. Normal compressibility and flow on color Doppler imaging. Profunda Femoral Vein: No  evidence of thrombus. Normal compressibility and flow on color Doppler imaging. Femoral Vein: No evidence of thrombus. Normal compressibility, respiratory phasicity and response to augmentation. Popliteal Vein: No evidence of thrombus. Normal compressibility, respiratory phasicity and response to augmentation. Calf Veins: No evidence of thrombus. Normal compressibility and flow on color Doppler imaging. Superficial Great Saphenous Vein: No evidence of thrombus. Normal compressibility. Venous Reflux:  None. Other Findings: No evidence of superficial thrombophlebitis or abnormal fluid collection. LEFT LOWER EXTREMITY Common Femoral Vein: No evidence of thrombus. Normal compressibility, respiratory phasicity and response to augmentation. Saphenofemoral Junction: No evidence of thrombus. Normal compressibility and flow on color Doppler imaging. Profunda Femoral Vein: No evidence of thrombus. Normal compressibility and flow on color Doppler imaging. Femoral Vein: No evidence of thrombus. Normal compressibility, respiratory phasicity and response to augmentation. Popliteal Vein: No evidence of thrombus. Normal compressibility, respiratory phasicity and response to augmentation. Calf Veins: No evidence of thrombus. Normal compressibility and flow on color Doppler imaging. Superficial Great Saphenous Vein: No evidence of thrombus. Normal compressibility. Venous Reflux:  None. Other Findings: No evidence of superficial thrombophlebitis or abnormal fluid collection. IMPRESSION: No evidence of deep venous thrombosis in either lower extremity. Electronically Signed   By: Irish Lack M.D.   On: 07/08/2023 09:48

## 2023-08-07 ENCOUNTER — Other Ambulatory Visit: Payer: Self-pay

## 2023-08-09 ENCOUNTER — Other Ambulatory Visit: Payer: Self-pay | Admitting: Internal Medicine

## 2023-08-09 ENCOUNTER — Ambulatory Visit: Payer: Managed Care, Other (non HMO) | Admitting: Internal Medicine

## 2023-08-09 ENCOUNTER — Encounter: Payer: Self-pay | Admitting: Internal Medicine

## 2023-08-09 MED ORDER — GABAPENTIN 100 MG PO CAPS: 100.0000 mg | ORAL_CAPSULE | Freq: Three times a day (TID) | ORAL | 0 refills | Status: DC

## 2023-08-16 ENCOUNTER — Encounter: Payer: Self-pay | Admitting: Radiation Oncology

## 2023-08-16 ENCOUNTER — Ambulatory Visit
Admission: RE | Admit: 2023-08-16 | Discharge: 2023-08-16 | Disposition: A | Discharge status: Home / Self Care | Payer: Managed Care, Other (non HMO) | Source: Ambulatory Visit | Attending: Radiation Oncology | Admitting: Radiation Oncology

## 2023-08-16 VITALS — BP 131/86 | HR 96 | Temp 97.4°F | Resp 16 | Wt 197.0 lb

## 2023-08-16 DIAGNOSIS — C50412 Malignant neoplasm of upper-outer quadrant of left female breast: Secondary | ICD-10-CM

## 2023-08-16 DIAGNOSIS — Z510 Encounter for antineoplastic radiation therapy: Secondary | ICD-10-CM | POA: Insufficient documentation

## 2023-08-16 DIAGNOSIS — C50912 Malignant neoplasm of unspecified site of left female breast: Secondary | ICD-10-CM | POA: Diagnosis present

## 2023-08-16 DIAGNOSIS — Z5111 Encounter for antineoplastic chemotherapy: Secondary | ICD-10-CM | POA: Insufficient documentation

## 2023-08-16 DIAGNOSIS — Z171 Estrogen receptor negative status [ER-]: Secondary | ICD-10-CM | POA: Diagnosis not present

## 2023-08-16 NOTE — Consult Note (Signed)
NEW PATIENT EVALUATION  Name: Cristina Baker  MRN: [id number]  Date:   08/16/2023     DOB: 05-02-1986   This 37 y.o. 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   PREVIOUS INVESTIGATIONS:  Mammogram ultrasound CT scans bone scan all reviewed Clinical notes reviewed Pathology reports reviewed  HPI: Patient is a 37 year old female who presents with self palpated mass in the left breast.  Initial mammogram showed a suspicious 2.4 cm mass at the site of palpable concern concerning for malignancy.  There was an indeterminate left axillary lymph node.  Both areas were biopsied with a left breast lesion showing 1.7 cm of overall grade 3 invasive mammary carcinoma as well as left axillary lymph node biopsy for invasive cancer.  Tumor was HER2/neu overexpressed ER/PR negative.  She went on to have a bone scanShowing some radiotracer activity the posterior ninth rib.  This is being followed clinically with CT scans.  Initial CT scan showed ill-defined mixed focus in posterior right ninth rib nonspecific but suspicious for early metastatic disease.  Discussion with IR advised at the lesion was too small for biopsy.  She underwent 6 cycles of TCHP which he tolerated well.  She then underwent wide local excision with pathology showing complete response as well as 3 sentinel lymph nodes all negative for metastatic disease with 1 lymph node node showing changes consistent with neoadjuvant therapy.   She is doing well postoperatively.  Specifically denies breast tenderness cough or bone pain.  She is now referred to radiation oncology for opinion.  PLANNED TREATMENT REGIMEN: Left whole breast and peripheral lymphatic radiation  PAST MEDICAL HISTORY:  has a past medical history of Abnormal Pap smear of cervix, Acquired stenosis of left nasolacrimal duct, Adult ADHD, Antineoplastic chemotherapy induced anemia, Anxiety, BRCA negative (2015), Breast cancer (HCC) (01/2023), Chemotherapy induced diarrhea, Chemotherapy-induced neuropathy (HCC), Dyspnea, 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, Family history of breast cancer (2015), Family history of ovarian cancer, Heart palpitations, Herpes genitalis, Hypomagnesemia, Leg swelling, Leukocytosis, Lichen sclerosus, Mixed hyperlipidemia, PAC (premature atrial contraction), PONV (postoperative nausea and vomiting), PVC's (premature ventricular contractions), and Tachycardia, unspecified.    PAST SURGICAL HISTORY:  Past Surgical History:  Procedure Laterality Date   BREAST BIOPSY Left 02/10/2023   invasive mammary carcinoma   BREAST BIOPSY Left 02/10/2023   [place] Core Hydromark clip path pending   BREAST BIOPSY Left 02/10/2023   [place] LT BREAST BX W LOC DEV 1ST LESION IMG BX SPEC US GUIDE 02/10/2023 [facility]   BREAST BIOPSY Left 06/30/2023   [place] LT RADIO FREQUENCY TAG EA ADD LESION LOC US GUIDE 06/30/2023 [facility]   BREAST LUMPECTOMY,RADIO FREQ LOCALIZER,AXILLARY SENTINEL LYMPH NODE BIOPSY Left 07/28/2023   Procedure: BREAST LUMPECTOMY,RADIO FREQ LOCALIZER,AXILLARY SENTINEL LYMPH NODE BIOPSY;  Surgeon: Sung Amabile, DO;  Location: [facility];  Service: General;  Laterality: Left;   CESAREAN SECTION  01/03/2007   CESAREAN SECTION WITH BILATERAL TUBAL LIGATION N/A 03/26/2016   Procedure: CESAREAN SECTION WITH BILATERAL TUBAL LIGATION;  Surgeon: Conard Novak, MD;  Location: [facility];  Service: Obstetrics;  Laterality: N/A;   COLPOSCOPY      IUD REMOVAL     PORTACATH PLACEMENT N/A 02/24/2023   Procedure: INSERTION PORT-A-CATH;  Surgeon: Sung Amabile, DO;  Location: [facility];  Service: General;  Laterality: N/A;    FAMILY HISTORY: family history includes Breast cancer in her paternal grandmother; Breast cancer (age of onset: 66) in her paternal aunt; Breast cancer (age of onset: 86) in her paternal aunt; Diabetes Mellitus II in her father; Heart attack in her paternal grandfather; Hypercholesterolemia in her mother; Hypertension in her father; Throat cancer in her paternal grandfather.  SOCIAL HISTORY:  reports that she quit smoking about 2 years ago. Her smoking use included cigarettes. She has never used smokeless tobacco. She reports current alcohol use. She reports that she does not use drugs.  ALLERGIES: Patient has no known allergies.  MEDICATIONS:  Current Outpatient Medications  Medication Sig Dispense Refill   acetaminophen (TYLENOL) 325 MG tablet Take 2 tablets (650 mg total) by mouth every 8 (eight) hours as needed for mild pain. 40 tablet 0   ALPRAZolam (XANAX) 0.5 MG tablet USE AS DIRECTED TAKE HALF TAB TWICE DAILY FOR ANXIETY 15 tablet 2   amphetamine-dextroamphetamine (ADDERALL) 20 MG tablet Take 1 tablet (20 mg total) by mouth 2 (two) times daily. 60 tablet 0   aspirin-acetaminophen-caffeine (EXCEDRIN MIGRAINE) 250-250-65 MG tablet Take 1 tablet by mouth every 6 (six) hours as needed for headache.     clobetasol ointment (TEMOVATE) 0.05 % Apply to affected area once wkly as maintenance 45 g 0   escitalopram (LEXAPRO) 10 MG tablet TAKE 1 TABLET BY MOUTH EVERY DAY IN THE MORNING 90 tablet 1   gabapentin (NEURONTIN) 100 MG capsule Take 1 capsule (100 mg total) by mouth 3 (three) times daily. 90 capsule 0   ibuprofen (ADVIL) 800 MG tablet Take 1 tablet (800 mg total) by mouth every 8 (eight) hours as needed for mild pain or moderate pain. 30 tablet 0   metoprolol succinate (TOPROL-XL) 25 MG 24 hr tablet TAKE 1  TABLET BY MOUTH ONCE A DAY **MAKE FOLLOW UP APPT WITH DR. KHAN FOR REFILLS** 90 tablet 0   No current facility-administered medications for this encounter.   Facility-Administered Medications Ordered in Other Encounters  Medication Dose Route Frequency Provider Last Rate Last Admin   heparin lock flush 100 unit/mL  500 Units Intravenous Once Michaelyn Barter, MD        ECOG PERFORMANCE STATUS:  0 - Asymptomatic  REVIEW OF SYSTEMS: Patient denies any weight loss, fatigue, weakness, fever, chills or night sweats. Patient denies any loss of vision, blurred vision. Patient denies any ringing  of the ears or hearing loss. No irregular heartbeat. Patient denies heart murmur or history of fainting. Patient denies any chest pain or pain radiating to her upper extremities. Patient denies any shortness of breath, difficulty breathing at night, cough or hemoptysis. Patient denies any swelling in the lower legs. Patient denies any nausea vomiting, vomiting of blood, or coffee ground material in the vomitus. Patient denies any stomach pain. Patient states has had normal bowel movements no significant constipation or diarrhea. Patient denies any dysuria, hematuria or significant nocturia. Patient denies any problems walking, swelling in the joints or loss of balance. Patient denies any skin changes, loss of hair or loss of weight. Patient denies any excessive worrying or anxiety or significant depression. Patient denies any problems with insomnia. Patient denies excessive thirst, polyuria, polydipsia. Patient denies any swollen glands,  patient denies easy bruising or easy bleeding. Patient denies any recent infections, allergies or URI. Patient "s visual fields have not changed significantly in recent time.   PHYSICAL EXAM: BP 131/86   Pulse 96   Temp (!) 97.4 F (36.3 C) (Tympanic)   Resp 16   Wt 197 lb (89.4 kg)   LMP 03/07/2023 (Approximate) Comment: pt is not pregnant, not breastfeeding, just finished  chemo in July 2024. Tubal Ligation in 2017.  BMI 31.80 kg/m  She status post wide localization and sentinel node biopsy of the left breast.  No dominant masses noted in either breast no axillary or supraclavicular adenopathy is appreciated.  Well-developed well-nourished patient in NAD. HEENT reveals PERLA, EOMI, discs not visualized.  Oral cavity is clear. No oral mucosal lesions are identified. Neck is clear without evidence of cervical or supraclavicular adenopathy. Lungs are clear to A&P. Cardiac examination is essentially unremarkable with regular rate and rhythm without murmur rub or thrill. Abdomen is benign with no organomegaly or masses noted. Motor sensory and DTR levels are equal and symmetric in the upper and lower extremities. Cranial nerves II through XII are grossly intact. Proprioception is intact. No peripheral adenopathy or edema is identified. No motor or sensory levels are noted. Crude visual fields are within normal range.  LABORATORY DATA: Pathology reports reviewed    RADIOLOGY RESULTS: CT scans bone scan mammograms ultrasound all reviewed compatible with above-stated findings   IMPRESSION: Initial stage IIb ER/PR negative HER2/neu overexpressed invasive mammary carcinoma the left breast with complete response after neoadjuvant chemotherapy and 37 year old female  PLAN: At this time I recommended whole breast and peripheral lymphatic radiation.  Will treat both areas the 5040 cGy in 28 fractions.  Will boost her scar another 1000 centigrade using photon beam therapy.  Down the road may consider short course of SBRT to her rib lesion should that show any evidence of progression.  Risks and benefits of treatment including skin reaction fatigue alteration blood counts possible inclusion of superficial lung slight chance of lymphedema of her left upper extremity all were described in detail to the patient and her sister.  They both seem to comprehend my treatment plan well.  I  personally set up and ordered CT simulation for later this week.  I would like to take this opportunity to thank you for allowing me to participate in the care of your patient.Carmina Miller, MD

## 2023-08-18 ENCOUNTER — Other Ambulatory Visit: Payer: Self-pay

## 2023-08-18 ENCOUNTER — Ambulatory Visit
Admission: RE | Admit: 2023-08-18 | Discharge: 2023-08-18 | Disposition: A | Discharge status: Home / Self Care | Payer: Managed Care, Other (non HMO) | Source: Ambulatory Visit | Attending: Radiation Oncology | Admitting: Radiation Oncology

## 2023-08-18 DIAGNOSIS — Z510 Encounter for antineoplastic radiation therapy: Secondary | ICD-10-CM | POA: Diagnosis not present

## 2023-08-19 ENCOUNTER — Other Ambulatory Visit: Payer: Self-pay | Admitting: *Deleted

## 2023-08-19 DIAGNOSIS — Z510 Encounter for antineoplastic radiation therapy: Secondary | ICD-10-CM | POA: Diagnosis not present

## 2023-08-19 DIAGNOSIS — C50412 Malignant neoplasm of upper-outer quadrant of left female breast: Secondary | ICD-10-CM

## 2023-08-22 ENCOUNTER — Encounter: Payer: Self-pay | Admitting: Internal Medicine

## 2023-08-22 ENCOUNTER — Other Ambulatory Visit: Payer: Self-pay | Admitting: Internal Medicine

## 2023-08-22 DIAGNOSIS — F909 Attention-deficit hyperactivity disorder, unspecified type: Secondary | ICD-10-CM

## 2023-08-22 MED ORDER — AMPHETAMINE-DEXTROAMPHETAMINE 20 MG PO TABS
20.0000 mg | ORAL_TABLET | Freq: Two times a day (BID) | ORAL | 0 refills | Status: DC
Start: 2023-08-22 — End: 2023-09-23

## 2023-08-25 ENCOUNTER — Ambulatory Visit: Admission: RE | Admit: 2023-08-25 | Payer: Managed Care, Other (non HMO) | Source: Ambulatory Visit

## 2023-08-25 DIAGNOSIS — Z510 Encounter for antineoplastic radiation therapy: Secondary | ICD-10-CM | POA: Diagnosis not present

## 2023-08-29 ENCOUNTER — Telehealth: Payer: Self-pay | Admitting: *Deleted

## 2023-08-29 ENCOUNTER — Ambulatory Visit
Admission: RE | Admit: 2023-08-29 | Discharge: 2023-08-29 | Disposition: A | Discharge status: Home / Self Care | Payer: Managed Care, Other (non HMO) | Source: Ambulatory Visit | Attending: Radiation Oncology | Admitting: Radiation Oncology

## 2023-08-29 ENCOUNTER — Other Ambulatory Visit: Payer: Self-pay

## 2023-08-29 DIAGNOSIS — Z510 Encounter for antineoplastic radiation therapy: Secondary | ICD-10-CM | POA: Diagnosis not present

## 2023-08-29 LAB — RAD ONC ARIA SESSION SUMMARY
Course Elapsed Days: 0
Plan Fractions Treated to Date: 1
Plan Prescribed Dose Per Fraction: 1.8 Gy
Plan Total Fractions Prescribed: 28
Plan Total Prescribed Dose: 50.4 Gy
Reference Point Dosage Given to Date: 1.8 Gy
Reference Point Session Dosage Given: 1.8 Gy
Session Number: 1

## 2023-08-29 NOTE — Telephone Encounter (Signed)
VM received from [facility] pharmacy requesting clarification on continuation of therapy for Udenyca as authorization is going to expire.  RN confirmed with Dr. Alena Bills that Greggory Keen is no longer needed as part of the patients treatment plan.  RN returned call to [facility], spoke with Mardella Layman and advised that Greggory Keen is no longer needed and request can be completed.

## 2023-08-30 ENCOUNTER — Ambulatory Visit
Admission: RE | Admit: 2023-08-30 | Discharge: 2023-08-30 | Disposition: A | Discharge status: Home / Self Care | Payer: Managed Care, Other (non HMO) | Source: Ambulatory Visit | Attending: Radiation Oncology | Admitting: Radiation Oncology

## 2023-08-30 ENCOUNTER — Inpatient Hospital Stay: Payer: Managed Care, Other (non HMO)

## 2023-08-30 ENCOUNTER — Encounter: Payer: Self-pay | Admitting: *Deleted

## 2023-08-30 ENCOUNTER — Other Ambulatory Visit: Payer: Self-pay

## 2023-08-30 ENCOUNTER — Inpatient Hospital Stay (HOSPITAL_BASED_OUTPATIENT_CLINIC_OR_DEPARTMENT_OTHER): Payer: Managed Care, Other (non HMO) | Admitting: Internal Medicine

## 2023-08-30 VITALS — Resp 16

## 2023-08-30 VITALS — BP 133/77 | HR 87 | Temp 97.2°F | Wt 199.4 lb

## 2023-08-30 DIAGNOSIS — T451X5A Adverse effect of antineoplastic and immunosuppressive drugs, initial encounter: Secondary | ICD-10-CM

## 2023-08-30 DIAGNOSIS — Z510 Encounter for antineoplastic radiation therapy: Secondary | ICD-10-CM | POA: Diagnosis not present

## 2023-08-30 DIAGNOSIS — G620 Drug-induced polyneuropathy: Secondary | ICD-10-CM

## 2023-08-30 DIAGNOSIS — C50412 Malignant neoplasm of upper-outer quadrant of left female breast: Secondary | ICD-10-CM | POA: Insufficient documentation

## 2023-08-30 DIAGNOSIS — Z79899 Other long term (current) drug therapy: Secondary | ICD-10-CM | POA: Insufficient documentation

## 2023-08-30 DIAGNOSIS — Z5112 Encounter for antineoplastic immunotherapy: Secondary | ICD-10-CM

## 2023-08-30 DIAGNOSIS — C50912 Malignant neoplasm of unspecified site of left female breast: Secondary | ICD-10-CM

## 2023-08-30 DIAGNOSIS — Z171 Estrogen receptor negative status [ER-]: Secondary | ICD-10-CM

## 2023-08-30 LAB — CBC WITH DIFFERENTIAL (CANCER CENTER ONLY)
Abs Immature Granulocytes: 0.01 10*3/uL (ref 0.00–0.07)
Basophils Absolute: 0 10*3/uL (ref 0.0–0.1)
Basophils Relative: 0 %
Eosinophils Absolute: 0.1 10*3/uL (ref 0.0–0.5)
Eosinophils Relative: 2 %
HCT: 35.1 % — ABNORMAL LOW (ref 36.0–46.0)
Hemoglobin: 11.2 g/dL — ABNORMAL LOW (ref 12.0–15.0)
Immature Granulocytes: 0 %
Lymphocytes Relative: 40 %
Lymphs Abs: 2.1 10*3/uL (ref 0.7–4.0)
MCH: 28.4 pg (ref 26.0–34.0)
MCHC: 31.9 g/dL (ref 30.0–36.0)
MCV: 89.1 fL (ref 80.0–100.0)
Monocytes Absolute: 0.4 10*3/uL (ref 0.1–1.0)
Monocytes Relative: 7 %
Neutro Abs: 2.7 10*3/uL (ref 1.7–7.7)
Neutrophils Relative %: 51 %
Platelet Count: 283 10*3/uL (ref 150–400)
RBC: 3.94 MIL/uL (ref 3.87–5.11)
RDW: 13.4 % (ref 11.5–15.5)
WBC Count: 5.2 10*3/uL (ref 4.0–10.5)
nRBC: 0 % (ref 0.0–0.2)

## 2023-08-30 LAB — RAD ONC ARIA SESSION SUMMARY
Course Elapsed Days: 1
Plan Fractions Treated to Date: 2
Plan Prescribed Dose Per Fraction: 1.8 Gy
Plan Total Fractions Prescribed: 28
Plan Total Prescribed Dose: 50.4 Gy
Reference Point Dosage Given to Date: 3.6 Gy
Reference Point Session Dosage Given: 1.8 Gy
Session Number: 2

## 2023-08-30 LAB — CMP (CANCER CENTER ONLY)
ALT: 21 U/L (ref 0–44)
AST: 19 U/L (ref 15–41)
Albumin: 3.7 g/dL (ref 3.5–5.0)
Alkaline Phosphatase: 66 U/L (ref 38–126)
Anion gap: 7 (ref 5–15)
BUN: 19 mg/dL (ref 6–20)
CO2: 25 mmol/L (ref 22–32)
Calcium: 9.5 mg/dL (ref 8.9–10.3)
Chloride: 106 mmol/L (ref 98–111)
Creatinine: 0.59 mg/dL (ref 0.44–1.00)
GFR, Estimated: >60 mL/min (ref >60)
Glucose, Bld: 113 mg/dL — ABNORMAL HIGH (ref 70–99)
Potassium: 3.4 mmol/L — ABNORMAL LOW (ref 3.5–5.1)
Sodium: 138 mmol/L (ref 135–145)
Total Bilirubin: 0.5 mg/dL (ref 0.3–1.2)
Total Protein: 7.3 g/dL (ref 6.5–8.1)

## 2023-08-30 MED ORDER — SODIUM CHLORIDE 0.9 % IV SOLN
840.0000 mg | Freq: Once | INTRAVENOUS | Status: AC
  Administered 2023-08-30: 840 mg via INTRAVENOUS
  Filled 2023-08-30: qty 28

## 2023-08-30 MED ORDER — HEPARIN SOD (PORK) LOCK FLUSH 100 UNIT/ML IV SOLN
500.0000 [IU] | Freq: Once | INTRAVENOUS | Status: AC | PRN
  Administered 2023-08-30: 500 [IU]
  Filled 2023-08-30: qty 5

## 2023-08-30 MED ORDER — ACETAMINOPHEN 325 MG PO TABS
650.0000 mg | ORAL_TABLET | Freq: Once | ORAL | Status: AC
  Administered 2023-08-30: 650 mg via ORAL
  Filled 2023-08-30: qty 2

## 2023-08-30 MED ORDER — SODIUM CHLORIDE 0.9% FLUSH
10.0000 mL | INTRAVENOUS | Status: DC | PRN
  Administered 2023-08-30: 10 mL
  Filled 2023-08-30: qty 10

## 2023-08-30 MED ORDER — SODIUM CHLORIDE 0.9 % IV SOLN: 420.0000 mg | Freq: Once | INTRAVENOUS | Status: DC

## 2023-08-30 MED ORDER — TRASTUZUMAB-ANNS CHEMO 150 MG IV SOLR
8.0000 mg/kg | Freq: Once | INTRAVENOUS | Status: AC
  Administered 2023-08-30: 735 mg via INTRAVENOUS
  Filled 2023-08-30: qty 35

## 2023-08-30 MED ORDER — SODIUM CHLORIDE 0.9 % IV SOLN
Freq: Once | INTRAVENOUS | Status: AC
  Filled 2023-08-30: qty 250

## 2023-08-30 MED ORDER — DIPHENHYDRAMINE HCL 25 MG PO CAPS
50.0000 mg | ORAL_CAPSULE | Freq: Once | ORAL | Status: AC
  Administered 2023-08-30: 50 mg via ORAL
  Filled 2023-08-30: qty 2

## 2023-08-30 MED ORDER — TRASTUZUMAB-ANNS CHEMO 150 MG IV SOLR: 6.0000 mg/kg | Freq: Once | INTRAVENOUS | Status: DC

## 2023-08-30 NOTE — Progress Notes (Signed)
[facility] CONSULT NOTE  Patient Care Team: Sherron Monday, MD as PCP - General (Internal Medicine) Hulen Luster, RN as Oncology Nurse Navigator Michaelyn Barter, MD as Consulting Physician (Oncology) Carmina Miller, MD as Consulting Physician (Radiation Oncology)  CANCER STAGING   Cancer Staging  Breast cancer [facility]) Staging form: Breast, AJCC 8th Edition - Clinical: Stage IIB (cT2, cN1, cM0, G3, ER-, PR-, HER2+) - Signed by Michaelyn Barter, MD on 02/17/2023 Histologic grading system: 3 grade system   ASSESSMENT & PLAN:  Cristina Baker 37 y.o. 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  # Left breast invasive ductal cancer, ER/PR negative, HER2 positive, stage IIb -Self detected left breast mass.  Status post biopsy on 02/10/2023.  -CT chest abdomen pelvis (03/01/2023)- Ill-defined mixed focus in the posterior right ninth rib corresponding with increased radiotracer uptake on the bone scan.  Nonspecific but suspicious for osseous metastatic disease.  I discussed the finding with Dr. Archer Asa of IR who advised that the lesion is very small in size about 6 mm and is difficult to biopsy. Repeat CT chest on 04/15/2023 showed unchanged right ninth rib lesion.  - Mid treatment mammogram and ultrasound showed complete resolution of the breast mass.   -Completed neoadjuvant TCHP x 6 cycle.   - s/p left breast lumpectomy with SLNB with Dr. Tonna Boehringer on 06/27/2023.  Pathology showed path CR.  0/3 lymph nodes negative for malignancy. -Radiation started on 08/29/2023.  Anticipated completion on 09/25/2023.  -Labs reviewed and acceptable for treatment.  Will proceed with maintenance Herceptin and Perjeta.  Her last dose was more than 6 weeks ago.  So we will reload.  Will obtain labs with every other cycle.  -Last echo from 05/30/2023 showed normal  EF.  Will continue monitoring every 3 to 4 months.  # Right ninth rib lesion -Questionable.  Last CT in May 2024 showed stable 7 mm lesion.  Case was previously discussed with Dr. Ruthe Mannan from IR and lesion was too small to biopsy. -Repeat CT chest from August 2024 showed stable right rib lesion.  Will continue to monitor. There is new 3 mm subpleural nodule in the left lower lobe likely inflammatory, surveillance suggested.  # Chemotherapy-induced neuropathy -Patient decided to hold off on Cymbalta due to interaction with Lexapro. -Continue with gabapentin 100 mg 3 times daily.  She is mainly using at night.  # Family history of breast cancer -Genetic testing was negative  # Anxiety -Xanax 0.25 mg twice daily as needed  # History of palpitations  -Reportedly, she had workup with cardiology which was negative.  It was deemed secondary to panic. -On metoprolol.  # Access-port placed by Dr. Tonna Boehringer.  Has Emla cream  Orders Placed This Encounter  Procedures   CBC with Differential (Cancer Center Only)    Standing Status:   Future    Number of Occurrences:   1    Standing Expiration Date:   08/29/2024   CMP (Cancer Center only)    Standing Status:   Future    Number of Occurrences:   1    Standing Expiration Date:   08/29/2024   RTC in 3 weeks for MD visit, labs, Herceptin Perjeta  The total time spent in the appointment was 30 minutes encounter with patients including review of chart and various tests results, discussions about plan of care and coordination of  care plan   All questions were answered. The patient knows to call the clinic with any problems, questions or concerns. No barriers to learning was detected.  Michaelyn Barter, MD 9/17/202411:44 AM   HISTORY OF PRESENTING ILLNESS:  Cristina Baker 37 y.o. female with pmh of anxiety, ADHD and palpitations was referred to medical oncology for management of stage IIb left breast invasive ductal cancer HER2 positive, hormone  negative.  She denies any pain or tenderness in the breast.  Denies any discharge.  There is family history of breast cancer in paternal grandmother and 2 aunts.  She had a BRCA testing about 15 years ago which was negative.  She has completed family-planning.  Underwent tubal ligation.  She has 3 children 1 in 35s second in his teen and the youngest is 38 years old.    Interval history Patient was seen today as follow-up to start her maintenance Herceptin and Perjeta. Continues to have neuropathy in her feet.  Her muscles feel like she has worked out.  She is using gabapentin mostly at night which is helping her with sleep.  Started radiation yesterday  I have reviewed her chart and materials related to her cancer extensively and collaborated history with the patient. Summary of oncologic history is as follows: Oncology History  Breast cancer (HCC)  01/26/2023 Initial Diagnosis   Patient felt palpable lump in left breast   02/10/2023 Mammogram     IMPRESSION: 1. There is a suspicious 24 mm mass at the site of palpable concern which is concerning for malignancy. It demonstrates 3 cm of associated calcifications. Recommend ultrasound-guided biopsy for definitive characterization with attention on post marker placement mammogram to assess for extent of calcifications in relation to the biopsy marking clip. 2. There is an indeterminate LEFT axillary lymph node. Recommend ultrasound-guided biopsy for definitive characterization. 3. No mammographic evidence of malignancy in the RIGHT breast.   02/10/2023 Pathology Results   DIAGNOSIS: A.  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  Size of invasive carcinoma: 1.7 cm in linear length in this sample Histologic grade of invasive carcinoma: Grade 3 Glandular/tubular differentiation score: 3 Nuclear pleomorphism score: 3 Mitotic rate score: 2 Total score: 8 Ductal carcinoma in situ:  Present, high-grade Lymphovascular invasion: Indeterminate See comment.  B.  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   Estrogen Receptor (ER) Status: NEGATIVE (LESS THAN 1%)         Internal control cells positive Progesterone Receptor (PgR) Status: Negative HER2 (by immunohistochemistry): Positive Ki-67: Not performed    02/17/2023 Cancer Staging   Staging form: Breast, AJCC 8th Edition - Clinical: Stage IIB (cT2, cN1, cM0, G3, ER-, PR-, HER2+) - Signed by Michaelyn Barter, MD on 02/17/2023 Histologic grading system: 3 grade system   03/07/2023 - 06/21/2023 Chemotherapy   Patient is on Treatment Plan : BREAST  Docetaxel + Carboplatin + Trastuzumab + Pertuzumab  (TCHP) q21d       Genetic Testing   Negative genetic testing. No pathogenic variants identified on the [facility] Multi-Cancer+RNA panel. The report date is 03/01/2023.  The Multi-Cancer + RNA Panel offered by [facility] includes sequencing and/or deletion/duplication analysis of the following 70 genes:  AIP*, ALK, APC*, ATM*, AXIN2*, BAP1*, BARD1*, BLM*, BMPR1A*, BRCA1*, BRCA2*, BRIP1*, CDC73*, CDH1*, CDK4, CDKN1B*, CDKN2A, CHEK2*, CTNNA1*, DICER1*, EPCAM, EGFR, FH*, FLCN*, GREM1, HOXB13, KIT, LZTR1, MAX*, MBD4, MEN1*, MET, MITF, MLH1*, MSH2*, MSH3*, MSH6*, MUTYH*, NF1*,  NF2*, NTHL1*, PALB2*, PDGFRA, PMS2*, POLD1*, POLE*, POT1*, PRKAR1A*, PTCH1*, PTEN*, RAD51C*, RAD51D*, RB1*, RET, SDHA*, SDHAF2*, SDHB*, SDHC*, SDHD*, SMAD4*, SMARCA4*, SMARCB1*, SMARCE1*, STK11*, SUFU*, TMEM127*, TP53*, TSC1*, TSC2*, VHL*. RNA analysis is performed for * genes.   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    Family history of breast cancer 2015   IBIS=18.8%   Family history of ovarian cancer    Heart palpitations    Herpes genitalis    Hypomagnesemia    Leg swelling    Leukocytosis    Lichen sclerosus    Mixed hyperlipidemia    PAC (premature atrial contraction)    PONV (postoperative nausea and vomiting)    nausea   PVC's (premature ventricular contractions)    Tachycardia, unspecified     SURGICAL HISTORY: Past Surgical History:  Procedure Laterality Date   BREAST BIOPSY Left 02/10/2023   invasive mammary carcinoma   BREAST BIOPSY Left 02/10/2023   [place] Core Hydromark clip path pending   BREAST BIOPSY Left 02/10/2023   [place] LT BREAST BX W LOC DEV 1ST LESION IMG BX SPEC US GUIDE 02/10/2023 [facility]   BREAST BIOPSY Left 06/30/2023   [place] LT RADIO FREQUENCY TAG EA ADD LESION LOC US GUIDE 06/30/2023 [facility]   BREAST LUMPECTOMY,RADIO FREQ LOCALIZER,AXILLARY SENTINEL LYMPH NODE BIOPSY Left 07/28/2023   Procedure: BREAST LUMPECTOMY,RADIO FREQ LOCALIZER,AXILLARY SENTINEL LYMPH NODE BIOPSY;  Surgeon: Sung Amabile, DO;  Location: [facility];  Service: General;  Laterality: Left;   CESAREAN SECTION  01/03/2007   CESAREAN SECTION WITH BILATERAL TUBAL LIGATION N/A 03/26/2016   Procedure: CESAREAN SECTION WITH BILATERAL TUBAL LIGATION;  Surgeon: Conard Novak, MD;  Location: [facility];  Service: Obstetrics;  Laterality: N/A;   COLPOSCOPY     IUD REMOVAL     PORTACATH PLACEMENT N/A 02/24/2023   Procedure: INSERTION PORT-A-CATH;  Surgeon: Sung Amabile, DO;  Location: [facility];  Service: General;  Laterality: N/A;    SOCIAL HISTORY: Social History   Socioeconomic History   Marital status: Married    Spouse name: Verdon Cummins   Number of children: 3   Years of  education: Not on file   Highest education level: Not on file  Occupational History   Not on file  Tobacco Use   Smoking status: Former    Current packs/day: 0.00    Types: Cigarettes    Quit date: 2022    Years since quitting: 2.7   Smokeless tobacco: Never  Vaping Use   Vaping status: Never Used  Substance and Sexual Activity   Alcohol use: Yes    Comment: rarely   Drug use: No   Sexual activity: Yes    Birth control/protection: Surgical    Comment: tubal ligation  Other Topics Concern   Not on file  Social History Narrative   Not on file   Social Determinants of Corporate investment banker  Strain: Low Risk  (04/05/2023)   Overall Financial Resource Strain (CARDIA)    Difficulty of Paying Living Expenses: Not very hard  Food Insecurity: No Food Insecurity (04/05/2023)   Hunger Vital Sign    Worried About Running Out of Food in the Last Year: Never true    Ran Out of Food in the Last Year: Never true  Transportation Needs: No Transportation Needs (02/17/2023)   PRAPARE - Administrator, Civil Service (Medical): No    Lack of Transportation (Non-Medical): No  Physical Activity: Inactive (04/05/2023)   Exercise Vital Sign    Days of Exercise per Week: 0 days    Minutes of Exercise per Session: 0 min  Stress: Stress Concern Present (04/05/2023)   [employer] of Occupational Health - Occupational Stress Questionnaire    Feeling of Stress : To some extent  Social Connections: Socially Integrated (04/05/2023)   Social Connection and Isolation Panel [NHANES]    Frequency of Communication with Friends and Family: Three times a week    Frequency of Social Gatherings with Friends and Family: Once a week    Attends Religious Services: 1 to 4 times per year    Active Member of [employer] or Organizations: Yes    Attends Banker Meetings: 1 to 4 times per year    Marital Status: Married  Catering manager Violence: Not At Risk (02/17/2023)   Humiliation,  Afraid, Rape, and Kick questionnaire    Fear of Current or Ex-Partner: No    Emotionally Abused: No    Physically Abused: No    Sexually Abused: No    FAMILY HISTORY: Family History  Problem Relation Age of Onset   Hypercholesterolemia Mother    Diabetes Mellitus II Father    Hypertension Father    Breast cancer Paternal Aunt 78   Breast cancer Paternal Aunt 94   Breast cancer Paternal Grandmother        71s   Throat cancer Paternal Grandfather    Heart attack Paternal Grandfather     ALLERGIES:  has No Known Allergies.  MEDICATIONS:  Current Outpatient Medications  Medication Sig Dispense Refill   ALPRAZolam (XANAX) 0.5 MG tablet USE AS DIRECTED TAKE HALF TAB TWICE DAILY FOR ANXIETY 15 tablet 2   amphetamine-dextroamphetamine (ADDERALL) 20 MG tablet Take 1 tablet (20 mg total) by mouth 2 (two) times daily. 60 tablet 0   aspirin-acetaminophen-caffeine (EXCEDRIN MIGRAINE) 250-250-65 MG tablet Take 1 tablet by mouth every 6 (six) hours as needed for headache.     clobetasol ointment (TEMOVATE) 0.05 % Apply to affected area once wkly as maintenance 45 g 0   escitalopram (LEXAPRO) 10 MG tablet TAKE 1 TABLET BY MOUTH EVERY DAY IN THE MORNING 90 tablet 1   gabapentin (NEURONTIN) 100 MG capsule Take 1 capsule (100 mg total) by mouth 3 (three) times daily. 90 capsule 0   ibuprofen (ADVIL) 800 MG tablet Take 1 tablet (800 mg total) by mouth every 8 (eight) hours as needed for mild pain or moderate pain. 30 tablet 0   metoprolol succinate (TOPROL-XL) 25 MG 24 hr tablet TAKE 1 TABLET BY MOUTH ONCE A DAY **MAKE FOLLOW UP APPT WITH DR. KHAN FOR REFILLS** 90 tablet 0   No current facility-administered medications for this visit.   Facility-Administered Medications Ordered in Other Visits  Medication Dose Route Frequency Provider Last Rate Last Admin   heparin lock flush 100 unit/mL  500 Units Intravenous Once Michaelyn Barter, MD  heparin lock flush 100 unit/mL  500 Units Intracatheter  Once PRN Michaelyn Barter, MD       pertuzumab (PERJETA) 840 mg in sodium chloride 0.9 % 250 mL chemo infusion  840 mg Intravenous Once Michaelyn Barter, MD       sodium chloride flush (NS) 0.9 % injection 10 mL  10 mL Intracatheter PRN Michaelyn Barter, MD       trastuzumab-anns [facility]) 735 mg in sodium chloride 0.9 % 250 mL chemo infusion  8 mg/kg (Treatment Plan Recorded) Intravenous Once Michaelyn Barter, MD        REVIEW OF SYSTEMS:   Pertinent information mentioned in HPI All other systems were reviewed with the patient and are negative.  PHYSICAL EXAMINATION: ECOG PERFORMANCE STATUS: 0 - Asymptomatic  Vitals:   08/30/23 0948  BP: 133/77  Pulse: 87  Temp: (!) 97.2 F (36.2 C)  SpO2: 100%    Filed Weights   08/30/23 0948  Weight: 199 lb 6.4 oz (90.4 kg)     GENERAL:alert, no distress and comfortable SKIN: skin color, texture, turgor are normal, no rashes or significant lesions EYES: normal, conjunctiva are pink and non-injected, sclera clear OROPHARYNX:no exudate, no erythema and lips, buccal mucosa, and tongue normal  NECK: supple, thyroid normal size, non-tender, without nodularity LYMPH:  no palpable lymphadenopathy in the cervical, axillary or inguinal LUNGS: clear to auscultation and percussion with normal breathing effort HEART: regular rate & rhythm and no murmurs and no lower extremity edema ABDOMEN:abdomen soft, non-tender and normal bowel sounds Musculoskeletal:no cyanosis of digits and no clubbing  PSYCH: alert & oriented x 3 with fluent speech NEURO: no focal motor/sensory deficits  LABORATORY DATA:  I have reviewed the data as listed Lab Results  Component Value Date   WBC 5.2 08/30/2023   HGB 11.2 (L) 08/30/2023   HCT 35.1 (L) 08/30/2023   MCV 89.1 08/30/2023   PLT 283 08/30/2023   Recent Labs    06/21/23 0826 07/12/23 1050 08/30/23 1027  NA 137 137 138  K 3.5 3.5 3.4*  CL 106 107 106  CO2 23 24 25   GLUCOSE 146* 117* 113*  BUN 22* 16 19   CREATININE 0.60 0.60 0.59  CALCIUM 9.2 9.1 9.5  GFRNONAA >60 >60 >60  PROT 6.3* 6.2* 7.3  ALBUMIN 3.5 3.3* 3.7  AST 24 22 19   ALT 37 41 21  ALKPHOS 62 56 66  BILITOT 0.4 0.3 0.5    RADIOGRAPHIC STUDIES: I have personally reviewed the radiological images as listed and agreed with the findings in the report. No results found.

## 2023-08-30 NOTE — Progress Notes (Signed)
Patient says that she started radiation the other day and it went ok. She did mention that she just lost her eye lashes recently since finishing up her chemo.

## 2023-08-31 ENCOUNTER — Ambulatory Visit
Admission: RE | Admit: 2023-08-31 | Discharge: 2023-08-31 | Disposition: A | Discharge status: Home / Self Care | Payer: Managed Care, Other (non HMO) | Source: Ambulatory Visit | Attending: Radiation Oncology | Admitting: Radiation Oncology

## 2023-08-31 ENCOUNTER — Other Ambulatory Visit: Payer: Self-pay

## 2023-08-31 DIAGNOSIS — Z510 Encounter for antineoplastic radiation therapy: Secondary | ICD-10-CM | POA: Diagnosis not present

## 2023-08-31 LAB — RAD ONC ARIA SESSION SUMMARY
Course Elapsed Days: 2
Plan Fractions Treated to Date: 3
Plan Prescribed Dose Per Fraction: 1.8 Gy
Plan Total Fractions Prescribed: 28
Plan Total Prescribed Dose: 50.4 Gy
Reference Point Dosage Given to Date: 5.4 Gy
Reference Point Session Dosage Given: 1.8 Gy
Session Number: 3

## 2023-09-01 ENCOUNTER — Ambulatory Visit
Admission: RE | Admit: 2023-09-01 | Discharge: 2023-09-01 | Disposition: A | Discharge status: Home / Self Care | Payer: Managed Care, Other (non HMO) | Source: Ambulatory Visit | Attending: Radiation Oncology | Admitting: Radiation Oncology

## 2023-09-01 ENCOUNTER — Ambulatory Visit: Payer: Managed Care, Other (non HMO)

## 2023-09-01 ENCOUNTER — Other Ambulatory Visit: Payer: Self-pay

## 2023-09-01 DIAGNOSIS — Z510 Encounter for antineoplastic radiation therapy: Secondary | ICD-10-CM | POA: Diagnosis not present

## 2023-09-01 LAB — RAD ONC ARIA SESSION SUMMARY
Course Elapsed Days: 3
Plan Fractions Treated to Date: 4
Plan Prescribed Dose Per Fraction: 1.8 Gy
Plan Total Fractions Prescribed: 28
Plan Total Prescribed Dose: 50.4 Gy
Reference Point Dosage Given to Date: 7.2 Gy
Reference Point Session Dosage Given: 1.8 Gy
Session Number: 4

## 2023-09-02 ENCOUNTER — Other Ambulatory Visit: Payer: Self-pay

## 2023-09-02 ENCOUNTER — Ambulatory Visit
Admission: RE | Admit: 2023-09-02 | Discharge: 2023-09-02 | Disposition: A | Discharge status: Home / Self Care | Payer: Managed Care, Other (non HMO) | Source: Ambulatory Visit | Attending: Radiation Oncology | Admitting: Radiation Oncology

## 2023-09-02 DIAGNOSIS — Z510 Encounter for antineoplastic radiation therapy: Secondary | ICD-10-CM | POA: Diagnosis not present

## 2023-09-02 LAB — RAD ONC ARIA SESSION SUMMARY
Course Elapsed Days: 4
Plan Fractions Treated to Date: 5
Plan Prescribed Dose Per Fraction: 1.8 Gy
Plan Total Fractions Prescribed: 28
Plan Total Prescribed Dose: 50.4 Gy
Reference Point Dosage Given to Date: 9 Gy
Reference Point Session Dosage Given: 1.8 Gy
Session Number: 5

## 2023-09-05 ENCOUNTER — Inpatient Hospital Stay: Payer: Managed Care, Other (non HMO)

## 2023-09-05 ENCOUNTER — Other Ambulatory Visit: Payer: Self-pay

## 2023-09-05 ENCOUNTER — Ambulatory Visit
Admission: RE | Admit: 2023-09-05 | Discharge: 2023-09-05 | Disposition: A | Discharge status: Home / Self Care | Payer: Managed Care, Other (non HMO) | Source: Ambulatory Visit | Attending: Radiation Oncology | Admitting: Radiation Oncology

## 2023-09-05 DIAGNOSIS — Z510 Encounter for antineoplastic radiation therapy: Secondary | ICD-10-CM | POA: Diagnosis not present

## 2023-09-05 DIAGNOSIS — C50412 Malignant neoplasm of upper-outer quadrant of left female breast: Secondary | ICD-10-CM

## 2023-09-05 LAB — RAD ONC ARIA SESSION SUMMARY
Course Elapsed Days: 7
Plan Fractions Treated to Date: 6
Plan Prescribed Dose Per Fraction: 1.8 Gy
Plan Total Fractions Prescribed: 28
Plan Total Prescribed Dose: 50.4 Gy
Reference Point Dosage Given to Date: 10.8 Gy
Reference Point Session Dosage Given: 1.8 Gy
Session Number: 6

## 2023-09-05 LAB — CBC (CANCER CENTER ONLY)
HCT: 32.5 % — ABNORMAL LOW (ref 36.0–46.0)
Hemoglobin: 10.6 g/dL — ABNORMAL LOW (ref 12.0–15.0)
MCH: 28.3 pg (ref 26.0–34.0)
MCHC: 32.6 g/dL (ref 30.0–36.0)
MCV: 86.9 fL (ref 80.0–100.0)
Platelet Count: 245 10*3/uL (ref 150–400)
RBC: 3.74 MIL/uL — ABNORMAL LOW (ref 3.87–5.11)
RDW: 13.4 % (ref 11.5–15.5)
WBC Count: 5.6 10*3/uL (ref 4.0–10.5)
nRBC: 0 % (ref 0.0–0.2)

## 2023-09-06 ENCOUNTER — Other Ambulatory Visit: Payer: Self-pay

## 2023-09-06 ENCOUNTER — Ambulatory Visit
Admission: RE | Admit: 2023-09-06 | Discharge: 2023-09-06 | Disposition: A | Discharge status: Home / Self Care | Payer: Managed Care, Other (non HMO) | Source: Ambulatory Visit | Attending: Radiation Oncology | Admitting: Radiation Oncology

## 2023-09-06 DIAGNOSIS — Z510 Encounter for antineoplastic radiation therapy: Secondary | ICD-10-CM | POA: Diagnosis not present

## 2023-09-06 LAB — RAD ONC ARIA SESSION SUMMARY
Course Elapsed Days: 8
Plan Fractions Treated to Date: 7
Plan Prescribed Dose Per Fraction: 1.8 Gy
Plan Total Fractions Prescribed: 28
Plan Total Prescribed Dose: 50.4 Gy
Reference Point Dosage Given to Date: 12.6 Gy
Reference Point Session Dosage Given: 1.8 Gy
Session Number: 7

## 2023-09-07 ENCOUNTER — Ambulatory Visit
Admission: RE | Admit: 2023-09-07 | Discharge: 2023-09-07 | Disposition: A | Discharge status: Home / Self Care | Payer: Managed Care, Other (non HMO) | Source: Ambulatory Visit | Attending: Radiation Oncology | Admitting: Radiation Oncology

## 2023-09-07 ENCOUNTER — Other Ambulatory Visit: Payer: Self-pay

## 2023-09-07 ENCOUNTER — Encounter: Payer: Self-pay | Admitting: Internal Medicine

## 2023-09-07 DIAGNOSIS — Z510 Encounter for antineoplastic radiation therapy: Secondary | ICD-10-CM | POA: Diagnosis not present

## 2023-09-07 LAB — RAD ONC ARIA SESSION SUMMARY
Course Elapsed Days: 9
Plan Fractions Treated to Date: 8
Plan Prescribed Dose Per Fraction: 1.8 Gy
Plan Total Fractions Prescribed: 28
Plan Total Prescribed Dose: 50.4 Gy
Reference Point Dosage Given to Date: 14.4 Gy
Reference Point Session Dosage Given: 1.8 Gy
Session Number: 8

## 2023-09-08 ENCOUNTER — Ambulatory Visit
Admission: RE | Admit: 2023-09-08 | Discharge: 2023-09-08 | Disposition: A | Discharge status: Home / Self Care | Payer: Managed Care, Other (non HMO) | Source: Ambulatory Visit | Attending: Radiation Oncology | Admitting: Radiation Oncology

## 2023-09-08 ENCOUNTER — Other Ambulatory Visit: Payer: Self-pay

## 2023-09-08 DIAGNOSIS — Z510 Encounter for antineoplastic radiation therapy: Secondary | ICD-10-CM | POA: Diagnosis not present

## 2023-09-08 LAB — RAD ONC ARIA SESSION SUMMARY
Course Elapsed Days: 10
Plan Fractions Treated to Date: 9
Plan Prescribed Dose Per Fraction: 1.8 Gy
Plan Total Fractions Prescribed: 28
Plan Total Prescribed Dose: 50.4 Gy
Reference Point Dosage Given to Date: 16.2 Gy
Reference Point Session Dosage Given: 1.8 Gy
Session Number: 9

## 2023-09-09 ENCOUNTER — Ambulatory Visit
Admission: RE | Admit: 2023-09-09 | Discharge: 2023-09-09 | Disposition: A | Discharge status: Home / Self Care | Payer: Managed Care, Other (non HMO) | Source: Ambulatory Visit | Attending: Radiation Oncology | Admitting: Radiation Oncology

## 2023-09-09 ENCOUNTER — Other Ambulatory Visit: Payer: Self-pay

## 2023-09-09 DIAGNOSIS — Z510 Encounter for antineoplastic radiation therapy: Secondary | ICD-10-CM | POA: Diagnosis not present

## 2023-09-09 LAB — RAD ONC ARIA SESSION SUMMARY
Course Elapsed Days: 11
Plan Fractions Treated to Date: 10
Plan Prescribed Dose Per Fraction: 1.8 Gy
Plan Total Fractions Prescribed: 28
Plan Total Prescribed Dose: 50.4 Gy
Reference Point Dosage Given to Date: 18 Gy
Reference Point Session Dosage Given: 1.8 Gy
Session Number: 10

## 2023-09-12 ENCOUNTER — Other Ambulatory Visit: Payer: Self-pay

## 2023-09-12 ENCOUNTER — Ambulatory Visit
Admission: RE | Admit: 2023-09-12 | Discharge: 2023-09-12 | Disposition: A | Discharge status: Home / Self Care | Payer: Managed Care, Other (non HMO) | Source: Ambulatory Visit | Attending: Radiation Oncology | Admitting: Radiation Oncology

## 2023-09-12 DIAGNOSIS — Z510 Encounter for antineoplastic radiation therapy: Secondary | ICD-10-CM | POA: Diagnosis not present

## 2023-09-12 LAB — RAD ONC ARIA SESSION SUMMARY
Course Elapsed Days: 14
Plan Fractions Treated to Date: 11
Plan Prescribed Dose Per Fraction: 1.8 Gy
Plan Total Fractions Prescribed: 28
Plan Total Prescribed Dose: 50.4 Gy
Reference Point Dosage Given to Date: 19.8 Gy
Reference Point Session Dosage Given: 1.8 Gy
Session Number: 11

## 2023-09-13 ENCOUNTER — Ambulatory Visit
Admission: RE | Admit: 2023-09-13 | Discharge: 2023-09-13 | Disposition: A | Discharge status: Home / Self Care | Payer: Managed Care, Other (non HMO) | Source: Ambulatory Visit | Attending: Radiation Oncology | Admitting: Radiation Oncology

## 2023-09-13 ENCOUNTER — Other Ambulatory Visit: Payer: Self-pay | Admitting: *Deleted

## 2023-09-13 ENCOUNTER — Other Ambulatory Visit: Payer: Self-pay

## 2023-09-13 DIAGNOSIS — Z5111 Encounter for antineoplastic chemotherapy: Secondary | ICD-10-CM | POA: Insufficient documentation

## 2023-09-13 DIAGNOSIS — C50912 Malignant neoplasm of unspecified site of left female breast: Secondary | ICD-10-CM | POA: Insufficient documentation

## 2023-09-13 DIAGNOSIS — Z171 Estrogen receptor negative status [ER-]: Secondary | ICD-10-CM | POA: Insufficient documentation

## 2023-09-13 DIAGNOSIS — C50412 Malignant neoplasm of upper-outer quadrant of left female breast: Secondary | ICD-10-CM | POA: Diagnosis present

## 2023-09-13 DIAGNOSIS — Z5112 Encounter for antineoplastic immunotherapy: Secondary | ICD-10-CM | POA: Diagnosis not present

## 2023-09-13 DIAGNOSIS — Z510 Encounter for antineoplastic radiation therapy: Secondary | ICD-10-CM | POA: Insufficient documentation

## 2023-09-13 DIAGNOSIS — Z79899 Other long term (current) drug therapy: Secondary | ICD-10-CM | POA: Diagnosis not present

## 2023-09-13 LAB — RAD ONC ARIA SESSION SUMMARY
Course Elapsed Days: 15
Plan Fractions Treated to Date: 12
Plan Prescribed Dose Per Fraction: 1.8 Gy
Plan Total Fractions Prescribed: 28
Plan Total Prescribed Dose: 50.4 Gy
Reference Point Dosage Given to Date: 21.6 Gy
Reference Point Session Dosage Given: 1.8 Gy
Session Number: 12

## 2023-09-13 MED ORDER — SILVER SULFADIAZINE 1 % EX CREA: 1.0000 | TOPICAL_CREAM | Freq: Two times a day (BID) | CUTANEOUS | 1 refills | Status: DC

## 2023-09-14 ENCOUNTER — Other Ambulatory Visit: Payer: Self-pay

## 2023-09-14 ENCOUNTER — Ambulatory Visit
Admission: RE | Admit: 2023-09-14 | Discharge: 2023-09-14 | Disposition: A | Discharge status: Home / Self Care | Payer: Managed Care, Other (non HMO) | Source: Ambulatory Visit | Attending: Radiation Oncology | Admitting: Radiation Oncology

## 2023-09-14 DIAGNOSIS — Z510 Encounter for antineoplastic radiation therapy: Secondary | ICD-10-CM | POA: Diagnosis not present

## 2023-09-14 LAB — RAD ONC ARIA SESSION SUMMARY
Course Elapsed Days: 16
Plan Fractions Treated to Date: 13
Plan Prescribed Dose Per Fraction: 1.8 Gy
Plan Total Fractions Prescribed: 28
Plan Total Prescribed Dose: 50.4 Gy
Reference Point Dosage Given to Date: 23.4 Gy
Reference Point Session Dosage Given: 1.8 Gy
Session Number: 13

## 2023-09-15 ENCOUNTER — Other Ambulatory Visit: Payer: Self-pay

## 2023-09-15 ENCOUNTER — Ambulatory Visit: Payer: Managed Care, Other (non HMO)

## 2023-09-15 ENCOUNTER — Encounter: Payer: Self-pay | Admitting: Internal Medicine

## 2023-09-16 ENCOUNTER — Ambulatory Visit
Admission: RE | Admit: 2023-09-16 | Discharge: 2023-09-16 | Disposition: A | Discharge status: Home / Self Care | Payer: Managed Care, Other (non HMO) | Source: Ambulatory Visit | Attending: Radiation Oncology | Admitting: Radiation Oncology

## 2023-09-16 ENCOUNTER — Other Ambulatory Visit: Payer: Self-pay

## 2023-09-16 DIAGNOSIS — Z510 Encounter for antineoplastic radiation therapy: Secondary | ICD-10-CM | POA: Diagnosis not present

## 2023-09-16 LAB — RAD ONC ARIA SESSION SUMMARY
Course Elapsed Days: 18
Plan Fractions Treated to Date: 14
Plan Prescribed Dose Per Fraction: 1.8 Gy
Plan Total Fractions Prescribed: 28
Plan Total Prescribed Dose: 50.4 Gy
Reference Point Dosage Given to Date: 25.2 Gy
Reference Point Session Dosage Given: 1.8 Gy
Session Number: 14

## 2023-09-19 ENCOUNTER — Ambulatory Visit
Admission: RE | Admit: 2023-09-19 | Discharge: 2023-09-19 | Disposition: A | Discharge status: Home / Self Care | Payer: Managed Care, Other (non HMO) | Source: Ambulatory Visit | Attending: Radiation Oncology | Admitting: Radiation Oncology

## 2023-09-19 ENCOUNTER — Inpatient Hospital Stay: Payer: Managed Care, Other (non HMO) | Attending: Internal Medicine

## 2023-09-19 ENCOUNTER — Other Ambulatory Visit: Payer: Self-pay

## 2023-09-19 ENCOUNTER — Other Ambulatory Visit: Payer: Self-pay | Admitting: Internal Medicine

## 2023-09-19 DIAGNOSIS — Z510 Encounter for antineoplastic radiation therapy: Secondary | ICD-10-CM | POA: Diagnosis not present

## 2023-09-19 DIAGNOSIS — Z171 Estrogen receptor negative status [ER-]: Secondary | ICD-10-CM | POA: Insufficient documentation

## 2023-09-19 DIAGNOSIS — Z79899 Other long term (current) drug therapy: Secondary | ICD-10-CM | POA: Insufficient documentation

## 2023-09-19 DIAGNOSIS — F909 Attention-deficit hyperactivity disorder, unspecified type: Secondary | ICD-10-CM

## 2023-09-19 DIAGNOSIS — Z5111 Encounter for antineoplastic chemotherapy: Secondary | ICD-10-CM | POA: Insufficient documentation

## 2023-09-19 DIAGNOSIS — Z5112 Encounter for antineoplastic immunotherapy: Secondary | ICD-10-CM | POA: Insufficient documentation

## 2023-09-19 DIAGNOSIS — C50412 Malignant neoplasm of upper-outer quadrant of left female breast: Secondary | ICD-10-CM | POA: Insufficient documentation

## 2023-09-19 LAB — RAD ONC ARIA SESSION SUMMARY
Course Elapsed Days: 21
Plan Fractions Treated to Date: 15
Plan Prescribed Dose Per Fraction: 1.8 Gy
Plan Total Fractions Prescribed: 28
Plan Total Prescribed Dose: 50.4 Gy
Reference Point Dosage Given to Date: 27 Gy
Reference Point Session Dosage Given: 1.8 Gy
Session Number: 15

## 2023-09-19 LAB — CBC (CANCER CENTER ONLY)
HCT: 33.7 % — ABNORMAL LOW (ref 36.0–46.0)
Hemoglobin: 10.9 g/dL — ABNORMAL LOW (ref 12.0–15.0)
MCH: 27.6 pg (ref 26.0–34.0)
MCHC: 32.3 g/dL (ref 30.0–36.0)
MCV: 85.3 fL (ref 80.0–100.0)
Platelet Count: 240 10*3/uL (ref 150–400)
RBC: 3.95 MIL/uL (ref 3.87–5.11)
RDW: 13.1 % (ref 11.5–15.5)
WBC Count: 5.3 10*3/uL (ref 4.0–10.5)
nRBC: 0 % (ref 0.0–0.2)

## 2023-09-20 ENCOUNTER — Inpatient Hospital Stay (HOSPITAL_BASED_OUTPATIENT_CLINIC_OR_DEPARTMENT_OTHER): Payer: Managed Care, Other (non HMO) | Admitting: Internal Medicine

## 2023-09-20 ENCOUNTER — Other Ambulatory Visit: Payer: Self-pay

## 2023-09-20 ENCOUNTER — Inpatient Hospital Stay: Payer: Managed Care, Other (non HMO)

## 2023-09-20 ENCOUNTER — Ambulatory Visit
Admission: RE | Admit: 2023-09-20 | Discharge: 2023-09-20 | Disposition: A | Discharge status: Home / Self Care | Payer: Managed Care, Other (non HMO) | Source: Ambulatory Visit | Attending: Radiation Oncology | Admitting: Radiation Oncology

## 2023-09-20 VITALS — BP 116/80 | HR 100 | Temp 97.7°F | Wt 195.0 lb

## 2023-09-20 VITALS — BP 121/71 | HR 92

## 2023-09-20 DIAGNOSIS — Z171 Estrogen receptor negative status [ER-]: Secondary | ICD-10-CM | POA: Diagnosis not present

## 2023-09-20 DIAGNOSIS — Z5112 Encounter for antineoplastic immunotherapy: Secondary | ICD-10-CM | POA: Diagnosis not present

## 2023-09-20 DIAGNOSIS — C50912 Malignant neoplasm of unspecified site of left female breast: Secondary | ICD-10-CM

## 2023-09-20 DIAGNOSIS — Z79899 Other long term (current) drug therapy: Secondary | ICD-10-CM | POA: Diagnosis not present

## 2023-09-20 DIAGNOSIS — Z510 Encounter for antineoplastic radiation therapy: Secondary | ICD-10-CM | POA: Diagnosis not present

## 2023-09-20 LAB — RAD ONC ARIA SESSION SUMMARY
Course Elapsed Days: 22
Plan Fractions Treated to Date: 16
Plan Prescribed Dose Per Fraction: 1.8 Gy
Plan Total Fractions Prescribed: 28
Plan Total Prescribed Dose: 50.4 Gy
Reference Point Dosage Given to Date: 28.8 Gy
Reference Point Session Dosage Given: 1.8 Gy
Session Number: 16

## 2023-09-20 MED ORDER — SODIUM CHLORIDE 0.9 % IV SOLN
Freq: Once | INTRAVENOUS | Status: AC
  Filled 2023-09-20: qty 250

## 2023-09-20 MED ORDER — DIPHENHYDRAMINE HCL 25 MG PO CAPS
50.0000 mg | ORAL_CAPSULE | Freq: Once | ORAL | Status: AC
  Administered 2023-09-20: 50 mg via ORAL
  Filled 2023-09-20: qty 2

## 2023-09-20 MED ORDER — TRASTUZUMAB-ANNS CHEMO 150 MG IV SOLR
6.0000 mg/kg | Freq: Once | INTRAVENOUS | Status: AC
  Administered 2023-09-20: 525 mg via INTRAVENOUS
  Filled 2023-09-20: qty 25

## 2023-09-20 MED ORDER — SODIUM CHLORIDE 0.9% FLUSH
10.0000 mL | INTRAVENOUS | Status: DC | PRN
  Administered 2023-09-20: 10 mL
  Filled 2023-09-20: qty 10

## 2023-09-20 MED ORDER — ACETAMINOPHEN 325 MG PO TABS
650.0000 mg | ORAL_TABLET | Freq: Once | ORAL | Status: AC
  Administered 2023-09-20: 650 mg via ORAL
  Filled 2023-09-20: qty 2

## 2023-09-20 MED ORDER — HEPARIN SOD (PORK) LOCK FLUSH 100 UNIT/ML IV SOLN
500.0000 [IU] | Freq: Once | INTRAVENOUS | Status: AC | PRN
  Administered 2023-09-20: 500 [IU]
  Filled 2023-09-20: qty 5

## 2023-09-20 MED ORDER — SODIUM CHLORIDE 0.9 % IV SOLN
420.0000 mg | Freq: Once | INTRAVENOUS | Status: AC
  Administered 2023-09-20: 420 mg via INTRAVENOUS
  Filled 2023-09-20: qty 14

## 2023-09-20 NOTE — Progress Notes (Signed)
[facility] CONSULT NOTE  Patient Care Team: Sherron Monday, MD as PCP - General (Internal Medicine) Hulen Luster, RN as Oncology Nurse Navigator Michaelyn Barter, MD as Consulting Physician (Oncology) Carmina Miller, MD as Consulting Physician (Radiation Oncology)  CANCER STAGING   Cancer Staging  Breast cancer [facility]) Staging form: Breast, AJCC 8th Edition - Clinical: Stage IIB (cT2, cN1, cM0, G3, ER-, PR-, HER2+) - Signed by Michaelyn Barter, MD on 02/17/2023 Histologic grading system: 3 grade system   ASSESSMENT & PLAN:  Cristina Baker 37 y.o. 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  # Left breast invasive ductal cancer, ER/PR negative, HER2 positive, stage IIb -Self detected left breast mass.  Status post biopsy on 02/10/2023.  -CT chest abdomen pelvis (03/01/2023)- Ill-defined mixed focus in the posterior right ninth rib corresponding with increased radiotracer uptake on the bone scan.  Nonspecific but suspicious for osseous metastatic disease.  I discussed the finding with Dr. Archer Asa of IR who advised that the lesion is very small in size about 6 mm and is difficult to biopsy. Repeat CT chest on 04/15/2023 showed unchanged right ninth rib lesion.  - Completed neoadjuvant TCHP x 6 cycle.   - s/p left breast lumpectomy with SLNB with Dr. Tonna Boehringer on 06/27/2023.  Pathology showed path CR.  0/3 lymph nodes negative for malignancy. -Radiation started on 08/29/2023.  Anticipated completion on 10/13/2023.  -Patient has been doing well on maintenance Herceptin and Perjeta.  Will do labs with alternate cycle.  Will continue with every 3-week regimen.  She will come in for labs and infusion only in 3 weeks.  I will follow-up with her in 6 weeks.  -Last echo from 05/30/2023 showed normal EF.  Will schedule for echocardiogram in beginning of  November for close monitoring while on Herceptin.  # Right ninth rib lesion -Questionable.  Last CT in May 2024 showed stable 7 mm lesion.  Case was previously discussed with Dr. Ruthe Mannan from IR and lesion was too small to biopsy. -Repeat CT chest from August 2024 showed stable right rib lesion.  Will continue to monitor. There is new 3 mm subpleural nodule in the left lower lobe likely inflammatory, surveillance suggested.  # Chemotherapy-induced neuropathy -Patient decided to hold off on Cymbalta due to interaction with Lexapro. -Was started on gabapentin 100 mg 3 times daily.  Did not think was helping her much and does not want to go back on it.  # Family history of breast cancer -Genetic testing was negative  # Anxiety -Xanax 0.25 mg twice daily as needed  # History of palpitations  -Reportedly, she had workup with cardiology which was negative.  It was deemed secondary to panic. -On metoprolol.  # Access-port placed by Dr. Tonna Boehringer.  Has Emla cream  Orders Placed This Encounter  Procedures   ECHOCARDIOGRAM COMPLETE    Standing Status:   Future    Standing Expiration Date:   09/19/2024    Order Specific Question:   Where should this test be performed    Answer:   [facility]    Order Specific Question:   Please indicate who you request to read the nuc med / echo results.    Answer:   [facility] Unassigned    Order Specific Question:   Perflutren DEFINITY (image enhancing agent) should be administered unless hypersensitivity or allergy exist  Answer:   Administer Perflutren    Order Specific Question:   Reason for exam-Echo    Answer:   Chemo  Z09   RTC in 3 weeks for labs and Herceptin Perjeta RTC in 6 weeks for MD visit, Herceptin Perjeta  The total time spent in the appointment was 30 minutes encounter with patients including review of chart and various tests results, discussions about plan of care and coordination of care plan   All questions were answered. The  patient knows to call the clinic with any problems, questions or concerns. No barriers to learning was detected.  Michaelyn Barter, MD 10/8/202411:46 AM   HISTORY OF PRESENTING ILLNESS:  Cristina Baker 37 y.o. 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  She denies any pain or tenderness in the breast.  Denies any discharge.  There is family history of breast cancer in paternal grandmother and 2 aunts.  She had a BRCA testing about 15 years ago which was negative.  She has completed family-planning.  Underwent tubal ligation.  She has 3 children 1 in 61s second in his teen and the youngest is 39 years old.    Interval history Patient was seen today as follow-up to start her maintenance Herceptin and Perjeta. Undergoing radiation.  Feeling tired.  Her body aches.  Continues to have neuropathy and feeling of her feet is sleeping when she wakes up in the morning.  Briefly used gabapentin did not think helped her so stopped.  We discussed about increasing the dose but she does not want to go back.  I have reviewed her chart and materials related to her cancer extensively and collaborated history with the patient. Summary of oncologic history is as follows: Oncology History  Breast cancer (HCC)  01/26/2023 Initial Diagnosis   Patient felt palpable lump in left breast   02/10/2023 Mammogram     IMPRESSION: 1. There is a suspicious 24 mm mass at the site of palpable concern which is concerning for malignancy. It demonstrates 3 cm of associated calcifications. Recommend ultrasound-guided biopsy for definitive characterization with attention on post marker placement mammogram to assess for extent of calcifications in relation to the biopsy marking clip. 2. There is an indeterminate LEFT axillary lymph node. Recommend ultrasound-guided biopsy for definitive  characterization. 3. No mammographic evidence of malignancy in the RIGHT breast.   02/10/2023 Pathology Results   DIAGNOSIS: A.  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  Size of invasive carcinoma: 1.7 cm in linear length in this sample Histologic grade of invasive carcinoma: Grade 3 Glandular/tubular differentiation score: 3 Nuclear pleomorphism score: 3 Mitotic rate score: 2 Total score: 8 Ductal carcinoma in situ: Present, high-grade Lymphovascular invasion: Indeterminate See comment.  B.  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   Estrogen Receptor (ER) Status: NEGATIVE (LESS THAN 1%)         Internal control cells positive Progesterone Receptor (PgR) Status: Negative HER2 (by immunohistochemistry): Positive Ki-67: Not performed    02/17/2023 Cancer Staging   Staging form: Breast, AJCC 8th Edition - Clinical: Stage IIB (cT2, cN1, cM0, G3, ER-, PR-, HER2+) - Signed by Michaelyn Barter, MD on 02/17/2023 Histologic grading system: 3 grade system   03/07/2023 - 06/21/2023 Chemotherapy   Patient is on Treatment Plan : BREAST  Docetaxel +  Carboplatin + Trastuzumab + Pertuzumab  (TCHP) q21d       Genetic Testing   Negative genetic testing. No pathogenic variants identified on the [facility] Multi-Cancer+RNA panel. The report date is 03/01/2023.  The Multi-Cancer + RNA Panel offered by [facility] includes sequencing and/or deletion/duplication analysis of the following 70 genes:  AIP*, ALK, APC*, ATM*, AXIN2*, BAP1*, BARD1*, BLM*, BMPR1A*, BRCA1*, BRCA2*, BRIP1*, CDC73*, CDH1*, CDK4, CDKN1B*, CDKN2A, CHEK2*, CTNNA1*, DICER1*, EPCAM, EGFR, FH*, FLCN*, GREM1, HOXB13, KIT, LZTR1, MAX*, MBD4, MEN1*, MET, MITF, MLH1*, MSH2*, MSH3*, MSH6*, MUTYH*, NF1*, NF2*, NTHL1*, PALB2*, PDGFRA, PMS2*, POLD1*, POLE*, POT1*, PRKAR1A*, PTCH1*, PTEN*, RAD51C*, RAD51D*, RB1*, RET, SDHA*,  SDHAF2*, SDHB*, SDHC*, SDHD*, SMAD4*, SMARCA4*, SMARCB1*, SMARCE1*, STK11*, SUFU*, TMEM127*, TP53*, TSC1*, TSC2*, VHL*. RNA analysis is performed for * genes.   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    Family history of breast cancer 2015   IBIS=18.8%   Family history of ovarian cancer    Heart palpitations    Herpes genitalis    Hypomagnesemia    Leg swelling    Leukocytosis    Lichen sclerosus    Mixed hyperlipidemia    PAC (premature atrial contraction)    PONV (postoperative nausea and vomiting)    nausea   PVC's (premature ventricular contractions)    Tachycardia, unspecified     SURGICAL HISTORY: Past Surgical History:  Procedure Laterality Date   BREAST BIOPSY Left 02/10/2023   invasive mammary carcinoma   BREAST BIOPSY Left 02/10/2023   [place] Core Hydromark clip path pending   BREAST BIOPSY Left 02/10/2023   [place] LT BREAST BX W LOC DEV 1ST LESION IMG BX SPEC US GUIDE 02/10/2023 [facility]   BREAST BIOPSY Left 06/30/2023   [place] LT RADIO FREQUENCY TAG EA ADD LESION LOC US GUIDE 06/30/2023 [facility]   BREAST LUMPECTOMY,RADIO FREQ LOCALIZER,AXILLARY SENTINEL LYMPH NODE BIOPSY Left 07/28/2023   Procedure: BREAST LUMPECTOMY,RADIO FREQ LOCALIZER,AXILLARY SENTINEL LYMPH NODE BIOPSY;  Surgeon: Sung Amabile, DO;  Location: [facility];  Service: General;  Laterality: Left;   CESAREAN SECTION  01/03/2007   CESAREAN SECTION WITH BILATERAL TUBAL LIGATION N/A 03/26/2016    Procedure: CESAREAN SECTION WITH BILATERAL TUBAL LIGATION;  Surgeon: Conard Novak, MD;  Location: [facility];  Service: Obstetrics;  Laterality: N/A;   COLPOSCOPY     IUD REMOVAL     PORTACATH PLACEMENT N/A 02/24/2023   Procedure: INSERTION PORT-A-CATH;  Surgeon: Sung Amabile, DO;  Location: [facility];  Service: General;  Laterality: N/A;    SOCIAL HISTORY: Social History   Socioeconomic History   Marital status: Married    Spouse name: Verdon Cummins   Number of children: 3   Years of education: Not on file   Highest education level: Not on file  Occupational History   Not on file  Tobacco Use   Smoking status: Former    Current packs/day: 0.00  Types: Cigarettes    Quit date: 2022    Years since quitting: 2.7   Smokeless tobacco: Never  Vaping Use   Vaping status: Never Used  Substance and Sexual Activity   Alcohol use: Yes    Comment: rarely   Drug use: No   Sexual activity: Yes    Birth control/protection: Surgical    Comment: tubal ligation  Other Topics Concern   Not on file  Social History Narrative   Not on file   Social Determinants of Health   Financial Resource Strain: Low Risk  (04/05/2023)   Overall Financial Resource Strain (CARDIA)    Difficulty of Paying Living Expenses: Not very hard  Food Insecurity: No Food Insecurity (04/05/2023)   Hunger Vital Sign    Worried About Running Out of Food in the Last Year: Never true    Ran Out of Food in the Last Year: Never true  Transportation Needs: No Transportation Needs (02/17/2023)   PRAPARE - Administrator, Civil Service (Medical): No    Lack of Transportation (Non-Medical): No  Physical Activity: Inactive (04/05/2023)   Exercise Vital Sign    Days of Exercise per Week: 0 days    Minutes of Exercise per Session: 0 min  Stress: Stress Concern Present (04/05/2023)   [employer] of Occupational Health - Occupational Stress Questionnaire    Feeling of Stress : To some extent  Social Connections:  Socially Integrated (04/05/2023)   Social Connection and Isolation Panel [NHANES]    Frequency of Communication with Friends and Family: Three times a week    Frequency of Social Gatherings with Friends and Family: Once a week    Attends Religious Services: 1 to 4 times per year    Active Member of [employer] or Organizations: Yes    Attends Banker Meetings: 1 to 4 times per year    Marital Status: Married  Catering manager Violence: Not At Risk (02/17/2023)   Humiliation, Afraid, Rape, and Kick questionnaire    Fear of Current or Ex-Partner: No    Emotionally Abused: No    Physically Abused: No    Sexually Abused: No    FAMILY HISTORY: Family History  Problem Relation Age of Onset   Hypercholesterolemia Mother    Diabetes Mellitus II Father    Hypertension Father    Breast cancer Paternal Aunt 64   Breast cancer Paternal Aunt 107   Breast cancer Paternal Grandmother        29s   Throat cancer Paternal Grandfather    Heart attack Paternal Grandfather     ALLERGIES:  has No Known Allergies.  MEDICATIONS:  Current Outpatient Medications  Medication Sig Dispense Refill   ALPRAZolam (XANAX) 0.5 MG tablet USE AS DIRECTED TAKE HALF TAB TWICE DAILY FOR ANXIETY 15 tablet 2   amphetamine-dextroamphetamine (ADDERALL) 20 MG tablet Take 1 tablet (20 mg total) by mouth 2 (two) times daily. 60 tablet 0   aspirin-acetaminophen-caffeine (EXCEDRIN MIGRAINE) 250-250-65 MG tablet Take 1 tablet by mouth every 6 (six) hours as needed for headache.     clobetasol ointment (TEMOVATE) 0.05 % Apply to affected area once wkly as maintenance 45 g 0   escitalopram (LEXAPRO) 10 MG tablet TAKE 1 TABLET BY MOUTH EVERY DAY IN THE MORNING 90 tablet 1   gabapentin (NEURONTIN) 100 MG capsule Take 1 capsule (100 mg total) by mouth 3 (three) times daily. 90 capsule 0   ibuprofen (ADVIL) 800 MG tablet Take 1 tablet (800 mg total)  by mouth every 8 (eight) hours as needed for mild pain or moderate pain. 30  tablet 0   metoprolol succinate (TOPROL-XL) 25 MG 24 hr tablet TAKE 1 TABLET BY MOUTH ONCE A DAY **MAKE FOLLOW UP APPT WITH DR. KHAN FOR REFILLS** 90 tablet 0   silver sulfADIAZINE (SILVADENE) 1 % cream Apply 1 Application topically 2 (two) times daily. Apply to affected area as directed. 50 g 1   No current facility-administered medications for this visit.   Facility-Administered Medications Ordered in Other Visits  Medication Dose Route Frequency Provider Last Rate Last Admin   0.9 %  sodium chloride infusion   Intravenous Once Michaelyn Barter, MD       heparin lock flush 100 unit/mL  500 Units Intravenous Once Michaelyn Barter, MD       heparin lock flush 100 unit/mL  500 Units Intracatheter Once PRN Michaelyn Barter, MD       pertuzumab (PERJETA) 420 mg in sodium chloride 0.9 % 250 mL chemo infusion  420 mg Intravenous Once Michaelyn Barter, MD       sodium chloride flush (NS) 0.9 % injection 10 mL  10 mL Intracatheter PRN Michaelyn Barter, MD       trastuzumab-anns Ernestine Mcmurray) 600 mg in sodium chloride 0.9 % 250 mL chemo infusion  6 mg/kg (Treatment Plan Recorded) Intravenous Once Michaelyn Barter, MD        REVIEW OF SYSTEMS:   Pertinent information mentioned in HPI All other systems were reviewed with the patient and are negative.  PHYSICAL EXAMINATION: ECOG PERFORMANCE STATUS: 0 - Asymptomatic  Vitals:   09/20/23 1018  BP: 116/80  Pulse: 100  Temp: 97.7 F (36.5 C)  SpO2: 98%    Filed Weights   09/20/23 1018  Weight: 195 lb (88.5 kg)     GENERAL:alert, no distress and comfortable SKIN: skin color, texture, turgor are normal, no rashes or significant lesions EYES: normal, conjunctiva are pink and non-injected, sclera clear OROPHARYNX:no exudate, no erythema and lips, buccal mucosa, and tongue normal  NECK: supple, thyroid normal size, non-tender, without nodularity LYMPH:  no palpable lymphadenopathy in the cervical, axillary or inguinal LUNGS: clear to auscultation and  percussion with normal breathing effort HEART: regular rate & rhythm and no murmurs and no lower extremity edema ABDOMEN:abdomen soft, non-tender and normal bowel sounds Musculoskeletal:no cyanosis of digits and no clubbing  PSYCH: alert & oriented x 3 with fluent speech NEURO: no focal motor/sensory deficits  LABORATORY DATA:  I have reviewed the data as listed Lab Results  Component Value Date   WBC 5.3 09/19/2023   HGB 10.9 (L) 09/19/2023   HCT 33.7 (L) 09/19/2023   MCV 85.3 09/19/2023   PLT 240 09/19/2023   Recent Labs    06/21/23 0826 07/12/23 1050 08/30/23 1027  NA 137 137 138  K 3.5 3.5 3.4*  CL 106 107 106  CO2 23 24 25   GLUCOSE 146* 117* 113*  BUN 22* 16 19  CREATININE 0.60 0.60 0.59  CALCIUM 9.2 9.1 9.5  GFRNONAA >60 >60 >60  PROT 6.3* 6.2* 7.3  ALBUMIN 3.5 3.3* 3.7  AST 24 22 19   ALT 37 41 21  ALKPHOS 62 56 66  BILITOT 0.4 0.3 0.5    RADIOGRAPHIC STUDIES: I have personally reviewed the radiological images as listed and agreed with the findings in the report. No results found.

## 2023-09-20 NOTE — Progress Notes (Signed)
Patient states that she is doing well and no new questions or concerns today

## 2023-09-20 NOTE — Progress Notes (Signed)
Updated wt in tx plan per Dr. Alan Ripper request; dose of Trastuzumab updated accordingly.  Ebony Hail, Pharm.D., CPP 09/20/2023@11 :46 AM

## 2023-09-21 ENCOUNTER — Other Ambulatory Visit: Payer: Self-pay | Admitting: Internal Medicine

## 2023-09-21 ENCOUNTER — Other Ambulatory Visit: Payer: Self-pay

## 2023-09-21 ENCOUNTER — Ambulatory Visit
Admission: RE | Admit: 2023-09-21 | Discharge: 2023-09-21 | Disposition: A | Discharge status: Home / Self Care | Payer: Managed Care, Other (non HMO) | Source: Ambulatory Visit | Attending: Radiation Oncology | Admitting: Radiation Oncology

## 2023-09-21 DIAGNOSIS — Z510 Encounter for antineoplastic radiation therapy: Secondary | ICD-10-CM | POA: Diagnosis not present

## 2023-09-21 LAB — RAD ONC ARIA SESSION SUMMARY
Course Elapsed Days: 23
Plan Fractions Treated to Date: 17
Plan Prescribed Dose Per Fraction: 1.8 Gy
Plan Total Fractions Prescribed: 28
Plan Total Prescribed Dose: 50.4 Gy
Reference Point Dosage Given to Date: 30.6 Gy
Reference Point Session Dosage Given: 1.8 Gy
Session Number: 17

## 2023-09-22 ENCOUNTER — Ambulatory Visit
Admission: RE | Admit: 2023-09-22 | Discharge: 2023-09-22 | Disposition: A | Discharge status: Home / Self Care | Payer: Managed Care, Other (non HMO) | Source: Ambulatory Visit | Attending: Radiation Oncology | Admitting: Radiation Oncology

## 2023-09-22 ENCOUNTER — Other Ambulatory Visit: Payer: Self-pay

## 2023-09-22 DIAGNOSIS — Z510 Encounter for antineoplastic radiation therapy: Secondary | ICD-10-CM | POA: Diagnosis not present

## 2023-09-22 LAB — RAD ONC ARIA SESSION SUMMARY
Course Elapsed Days: 24
Plan Fractions Treated to Date: 18
Plan Prescribed Dose Per Fraction: 1.8 Gy
Plan Total Fractions Prescribed: 28
Plan Total Prescribed Dose: 50.4 Gy
Reference Point Dosage Given to Date: 32.4 Gy
Reference Point Session Dosage Given: 1.8 Gy
Session Number: 18

## 2023-09-23 ENCOUNTER — Ambulatory Visit
Admission: RE | Admit: 2023-09-23 | Discharge: 2023-09-23 | Disposition: A | Discharge status: Home / Self Care | Payer: Managed Care, Other (non HMO) | Source: Ambulatory Visit | Attending: Radiation Oncology | Admitting: Radiation Oncology

## 2023-09-23 ENCOUNTER — Other Ambulatory Visit: Payer: Self-pay

## 2023-09-23 ENCOUNTER — Other Ambulatory Visit: Payer: Self-pay | Admitting: Internal Medicine

## 2023-09-23 DIAGNOSIS — F909 Attention-deficit hyperactivity disorder, unspecified type: Secondary | ICD-10-CM

## 2023-09-23 DIAGNOSIS — Z510 Encounter for antineoplastic radiation therapy: Secondary | ICD-10-CM | POA: Diagnosis not present

## 2023-09-23 LAB — RAD ONC ARIA SESSION SUMMARY
Course Elapsed Days: 25
Plan Fractions Treated to Date: 19
Plan Prescribed Dose Per Fraction: 1.8 Gy
Plan Total Fractions Prescribed: 28
Plan Total Prescribed Dose: 50.4 Gy
Reference Point Dosage Given to Date: 34.2 Gy
Reference Point Session Dosage Given: 1.8 Gy
Session Number: 19

## 2023-09-23 MED ORDER — AMPHETAMINE-DEXTROAMPHETAMINE 20 MG PO TABS: 20.0000 mg | ORAL_TABLET | Freq: Two times a day (BID) | ORAL | 0 refills | Status: DC

## 2023-09-26 ENCOUNTER — Other Ambulatory Visit: Payer: Self-pay

## 2023-09-26 ENCOUNTER — Ambulatory Visit
Admission: RE | Admit: 2023-09-26 | Discharge: 2023-09-26 | Disposition: A | Discharge status: Home / Self Care | Payer: Managed Care, Other (non HMO) | Source: Ambulatory Visit | Attending: Radiation Oncology | Admitting: Radiation Oncology

## 2023-09-26 DIAGNOSIS — Z510 Encounter for antineoplastic radiation therapy: Secondary | ICD-10-CM | POA: Diagnosis not present

## 2023-09-26 LAB — RAD ONC ARIA SESSION SUMMARY
Course Elapsed Days: 28
Plan Fractions Treated to Date: 20
Plan Prescribed Dose Per Fraction: 1.8 Gy
Plan Total Fractions Prescribed: 28
Plan Total Prescribed Dose: 50.4 Gy
Reference Point Dosage Given to Date: 36 Gy
Reference Point Session Dosage Given: 1.8 Gy
Session Number: 20

## 2023-09-27 ENCOUNTER — Ambulatory Visit
Admission: RE | Admit: 2023-09-27 | Discharge: 2023-09-27 | Disposition: A | Discharge status: Home / Self Care | Payer: Managed Care, Other (non HMO) | Source: Ambulatory Visit | Attending: Radiation Oncology | Admitting: Radiation Oncology

## 2023-09-27 ENCOUNTER — Other Ambulatory Visit: Payer: Self-pay

## 2023-09-27 DIAGNOSIS — Z510 Encounter for antineoplastic radiation therapy: Secondary | ICD-10-CM | POA: Diagnosis not present

## 2023-09-27 LAB — RAD ONC ARIA SESSION SUMMARY
Course Elapsed Days: 29
Plan Fractions Treated to Date: 21
Plan Prescribed Dose Per Fraction: 1.8 Gy
Plan Total Fractions Prescribed: 28
Plan Total Prescribed Dose: 50.4 Gy
Reference Point Dosage Given to Date: 37.8 Gy
Reference Point Session Dosage Given: 1.8 Gy
Session Number: 21

## 2023-09-28 ENCOUNTER — Other Ambulatory Visit: Payer: Self-pay

## 2023-09-28 ENCOUNTER — Ambulatory Visit
Admission: RE | Admit: 2023-09-28 | Discharge: 2023-09-28 | Disposition: A | Discharge status: Home / Self Care | Payer: Managed Care, Other (non HMO) | Source: Ambulatory Visit | Attending: Radiation Oncology | Admitting: Radiation Oncology

## 2023-09-28 DIAGNOSIS — Z510 Encounter for antineoplastic radiation therapy: Secondary | ICD-10-CM | POA: Diagnosis not present

## 2023-09-28 LAB — RAD ONC ARIA SESSION SUMMARY
Course Elapsed Days: 30
Plan Fractions Treated to Date: 22
Plan Prescribed Dose Per Fraction: 1.8 Gy
Plan Total Fractions Prescribed: 28
Plan Total Prescribed Dose: 50.4 Gy
Reference Point Dosage Given to Date: 39.6 Gy
Reference Point Session Dosage Given: 1.8 Gy
Session Number: 22

## 2023-09-29 ENCOUNTER — Other Ambulatory Visit: Payer: Self-pay

## 2023-09-29 ENCOUNTER — Ambulatory Visit
Admission: RE | Admit: 2023-09-29 | Discharge: 2023-09-29 | Disposition: A | Discharge status: Home / Self Care | Payer: Managed Care, Other (non HMO) | Source: Ambulatory Visit | Attending: Radiation Oncology | Admitting: Radiation Oncology

## 2023-09-29 DIAGNOSIS — Z510 Encounter for antineoplastic radiation therapy: Secondary | ICD-10-CM | POA: Diagnosis not present

## 2023-09-29 LAB — RAD ONC ARIA SESSION SUMMARY
Course Elapsed Days: 31
Plan Fractions Treated to Date: 23
Plan Prescribed Dose Per Fraction: 1.8 Gy
Plan Total Fractions Prescribed: 28
Plan Total Prescribed Dose: 50.4 Gy
Reference Point Dosage Given to Date: 41.4 Gy
Reference Point Session Dosage Given: 1.8 Gy
Session Number: 23

## 2023-09-30 ENCOUNTER — Ambulatory Visit
Admission: RE | Admit: 2023-09-30 | Discharge: 2023-09-30 | Disposition: A | Discharge status: Home / Self Care | Payer: Managed Care, Other (non HMO) | Source: Ambulatory Visit | Attending: Radiation Oncology | Admitting: Radiation Oncology

## 2023-09-30 ENCOUNTER — Other Ambulatory Visit: Payer: Self-pay

## 2023-09-30 DIAGNOSIS — Z510 Encounter for antineoplastic radiation therapy: Secondary | ICD-10-CM | POA: Diagnosis not present

## 2023-09-30 LAB — RAD ONC ARIA SESSION SUMMARY
Course Elapsed Days: 32
Plan Fractions Treated to Date: 24
Plan Prescribed Dose Per Fraction: 1.8 Gy
Plan Total Fractions Prescribed: 28
Plan Total Prescribed Dose: 50.4 Gy
Reference Point Dosage Given to Date: 43.2 Gy
Reference Point Session Dosage Given: 1.8 Gy
Session Number: 24

## 2023-09-30 MED ORDER — ESCITALOPRAM OXALATE 10 MG PO TABS: 10.0000 mg | ORAL_TABLET | Freq: Every day | ORAL | 1 refills | Status: DC

## 2023-10-03 ENCOUNTER — Other Ambulatory Visit: Payer: Self-pay

## 2023-10-03 ENCOUNTER — Ambulatory Visit
Admission: RE | Admit: 2023-10-03 | Discharge: 2023-10-03 | Disposition: A | Discharge status: Home / Self Care | Payer: Managed Care, Other (non HMO) | Source: Ambulatory Visit | Attending: Radiation Oncology | Admitting: Radiation Oncology

## 2023-10-03 ENCOUNTER — Inpatient Hospital Stay: Payer: Managed Care, Other (non HMO)

## 2023-10-03 DIAGNOSIS — C50412 Malignant neoplasm of upper-outer quadrant of left female breast: Secondary | ICD-10-CM

## 2023-10-03 DIAGNOSIS — Z510 Encounter for antineoplastic radiation therapy: Secondary | ICD-10-CM | POA: Diagnosis not present

## 2023-10-03 LAB — RAD ONC ARIA SESSION SUMMARY
Course Elapsed Days: 35
Plan Fractions Treated to Date: 25
Plan Prescribed Dose Per Fraction: 1.8 Gy
Plan Total Fractions Prescribed: 28
Plan Total Prescribed Dose: 50.4 Gy
Reference Point Dosage Given to Date: 45 Gy
Reference Point Session Dosage Given: 1.8 Gy
Session Number: 25

## 2023-10-03 LAB — CBC (CANCER CENTER ONLY)
HCT: 33.5 % — ABNORMAL LOW (ref 36.0–46.0)
Hemoglobin: 11.1 g/dL — ABNORMAL LOW (ref 12.0–15.0)
MCH: 28.1 pg (ref 26.0–34.0)
MCHC: 33.1 g/dL (ref 30.0–36.0)
MCV: 84.8 fL (ref 80.0–100.0)
Platelet Count: 246 10*3/uL (ref 150–400)
RBC: 3.95 MIL/uL (ref 3.87–5.11)
RDW: 13.2 % (ref 11.5–15.5)
WBC Count: 4.7 10*3/uL (ref 4.0–10.5)
nRBC: 0 % (ref 0.0–0.2)

## 2023-10-04 ENCOUNTER — Ambulatory Visit
Admission: RE | Admit: 2023-10-04 | Discharge: 2023-10-04 | Disposition: A | Discharge status: Home / Self Care | Payer: Managed Care, Other (non HMO) | Source: Ambulatory Visit | Attending: Radiation Oncology | Admitting: Radiation Oncology

## 2023-10-04 ENCOUNTER — Other Ambulatory Visit: Payer: Self-pay

## 2023-10-04 DIAGNOSIS — Z510 Encounter for antineoplastic radiation therapy: Secondary | ICD-10-CM | POA: Diagnosis not present

## 2023-10-04 LAB — RAD ONC ARIA SESSION SUMMARY
Course Elapsed Days: 36
Plan Fractions Treated to Date: 26
Plan Prescribed Dose Per Fraction: 1.8 Gy
Plan Total Fractions Prescribed: 28
Plan Total Prescribed Dose: 50.4 Gy
Reference Point Dosage Given to Date: 46.8 Gy
Reference Point Session Dosage Given: 1.8 Gy
Session Number: 26

## 2023-10-05 ENCOUNTER — Other Ambulatory Visit: Payer: Self-pay

## 2023-10-05 ENCOUNTER — Ambulatory Visit: Payer: Managed Care, Other (non HMO)

## 2023-10-05 ENCOUNTER — Ambulatory Visit
Admission: RE | Admit: 2023-10-05 | Discharge: 2023-10-05 | Discharge status: Home / Self Care | Payer: Managed Care, Other (non HMO) | Source: Ambulatory Visit | Attending: Radiation Oncology | Admitting: Radiation Oncology

## 2023-10-05 DIAGNOSIS — Z510 Encounter for antineoplastic radiation therapy: Secondary | ICD-10-CM | POA: Diagnosis not present

## 2023-10-05 LAB — RAD ONC ARIA SESSION SUMMARY
Course Elapsed Days: 37
Plan Fractions Treated to Date: 27
Plan Prescribed Dose Per Fraction: 1.8 Gy
Plan Total Fractions Prescribed: 28
Plan Total Prescribed Dose: 50.4 Gy
Reference Point Dosage Given to Date: 48.6 Gy
Reference Point Session Dosage Given: 1.8 Gy
Session Number: 27

## 2023-10-06 ENCOUNTER — Ambulatory Visit
Admission: RE | Admit: 2023-10-06 | Discharge: 2023-10-06 | Disposition: A | Discharge status: Home / Self Care | Payer: Managed Care, Other (non HMO) | Source: Ambulatory Visit | Attending: Radiation Oncology | Admitting: Radiation Oncology

## 2023-10-06 ENCOUNTER — Ambulatory Visit: Admission: RE | Admit: 2023-10-06 | Payer: Managed Care, Other (non HMO) | Source: Ambulatory Visit

## 2023-10-06 ENCOUNTER — Other Ambulatory Visit: Payer: Self-pay

## 2023-10-06 DIAGNOSIS — Z510 Encounter for antineoplastic radiation therapy: Secondary | ICD-10-CM | POA: Diagnosis not present

## 2023-10-06 LAB — RAD ONC ARIA SESSION SUMMARY
Course Elapsed Days: 38
Plan Fractions Treated to Date: 28
Plan Prescribed Dose Per Fraction: 1.8 Gy
Plan Total Fractions Prescribed: 28
Plan Total Prescribed Dose: 50.4 Gy
Reference Point Dosage Given to Date: 50.4 Gy
Reference Point Session Dosage Given: 1.8 Gy
Session Number: 28

## 2023-10-07 ENCOUNTER — Ambulatory Visit
Admission: RE | Admit: 2023-10-07 | Discharge: 2023-10-07 | Disposition: A | Discharge status: Home / Self Care | Payer: Managed Care, Other (non HMO) | Source: Ambulatory Visit | Attending: Radiation Oncology | Admitting: Radiation Oncology

## 2023-10-07 ENCOUNTER — Other Ambulatory Visit: Payer: Self-pay

## 2023-10-07 DIAGNOSIS — Z510 Encounter for antineoplastic radiation therapy: Secondary | ICD-10-CM | POA: Diagnosis not present

## 2023-10-07 LAB — RAD ONC ARIA SESSION SUMMARY
Course Elapsed Days: 39
Plan Fractions Treated to Date: 1
Plan Prescribed Dose Per Fraction: 2 Gy
Plan Total Fractions Prescribed: 5
Plan Total Prescribed Dose: 10 Gy
Reference Point Dosage Given to Date: 2 Gy
Reference Point Session Dosage Given: 2 Gy
Session Number: 29

## 2023-10-08 ENCOUNTER — Other Ambulatory Visit: Payer: Self-pay

## 2023-10-10 ENCOUNTER — Other Ambulatory Visit: Payer: Self-pay

## 2023-10-10 ENCOUNTER — Ambulatory Visit
Admission: RE | Admit: 2023-10-10 | Discharge: 2023-10-10 | Disposition: A | Discharge status: Home / Self Care | Payer: Managed Care, Other (non HMO) | Source: Ambulatory Visit | Attending: Radiation Oncology | Admitting: Radiation Oncology

## 2023-10-10 DIAGNOSIS — Z510 Encounter for antineoplastic radiation therapy: Secondary | ICD-10-CM | POA: Diagnosis not present

## 2023-10-10 LAB — RAD ONC ARIA SESSION SUMMARY
Course Elapsed Days: 42
Plan Fractions Treated to Date: 2
Plan Prescribed Dose Per Fraction: 2 Gy
Plan Total Fractions Prescribed: 5
Plan Total Prescribed Dose: 10 Gy
Reference Point Dosage Given to Date: 4 Gy
Reference Point Session Dosage Given: 2 Gy
Session Number: 30

## 2023-10-11 ENCOUNTER — Ambulatory Visit: Payer: Managed Care, Other (non HMO)

## 2023-10-11 ENCOUNTER — Inpatient Hospital Stay: Payer: Managed Care, Other (non HMO)

## 2023-10-11 ENCOUNTER — Ambulatory Visit
Admission: RE | Admit: 2023-10-11 | Discharge: 2023-10-11 | Disposition: A | Discharge status: Home / Self Care | Payer: Managed Care, Other (non HMO) | Source: Ambulatory Visit | Attending: Radiation Oncology | Admitting: Radiation Oncology

## 2023-10-11 ENCOUNTER — Ambulatory Visit: Payer: Managed Care, Other (non HMO) | Admitting: Internal Medicine

## 2023-10-11 ENCOUNTER — Other Ambulatory Visit: Payer: Self-pay

## 2023-10-11 VITALS — BP 127/78 | HR 85 | Temp 96.6°F | Resp 16

## 2023-10-11 DIAGNOSIS — C50912 Malignant neoplasm of unspecified site of left female breast: Secondary | ICD-10-CM

## 2023-10-11 DIAGNOSIS — Z510 Encounter for antineoplastic radiation therapy: Secondary | ICD-10-CM | POA: Diagnosis not present

## 2023-10-11 DIAGNOSIS — C50412 Malignant neoplasm of upper-outer quadrant of left female breast: Secondary | ICD-10-CM

## 2023-10-11 LAB — CBC (CANCER CENTER ONLY)
HCT: 33.9 % — ABNORMAL LOW (ref 36.0–46.0)
Hemoglobin: 11.1 g/dL — ABNORMAL LOW (ref 12.0–15.0)
MCH: 27.6 pg (ref 26.0–34.0)
MCHC: 32.7 g/dL (ref 30.0–36.0)
MCV: 84.3 fL (ref 80.0–100.0)
Platelet Count: 235 10*3/uL (ref 150–400)
RBC: 4.02 MIL/uL (ref 3.87–5.11)
RDW: 13.2 % (ref 11.5–15.5)
WBC Count: 3.8 10*3/uL — ABNORMAL LOW (ref 4.0–10.5)
nRBC: 0 % (ref 0.0–0.2)

## 2023-10-11 LAB — RAD ONC ARIA SESSION SUMMARY
Course Elapsed Days: 43
Plan Fractions Treated to Date: 3
Plan Prescribed Dose Per Fraction: 2 Gy
Plan Total Fractions Prescribed: 5
Plan Total Prescribed Dose: 10 Gy
Reference Point Dosage Given to Date: 6 Gy
Reference Point Session Dosage Given: 2 Gy
Session Number: 31

## 2023-10-11 MED ORDER — SODIUM CHLORIDE 0.9 % IV SOLN
Freq: Once | INTRAVENOUS | Status: AC
  Filled 2023-10-11: qty 250

## 2023-10-11 MED ORDER — TRASTUZUMAB-ANNS CHEMO 150 MG IV SOLR
6.0000 mg/kg | Freq: Once | INTRAVENOUS | Status: AC
  Administered 2023-10-11: 525 mg via INTRAVENOUS
  Filled 2023-10-11: qty 25

## 2023-10-11 MED ORDER — ACETAMINOPHEN 325 MG PO TABS
650.0000 mg | ORAL_TABLET | Freq: Once | ORAL | Status: AC
  Administered 2023-10-11: 650 mg via ORAL
  Filled 2023-10-11: qty 2

## 2023-10-11 MED ORDER — DIPHENHYDRAMINE HCL 25 MG PO CAPS
50.0000 mg | ORAL_CAPSULE | Freq: Once | ORAL | Status: AC
  Administered 2023-10-11: 50 mg via ORAL
  Filled 2023-10-11: qty 2

## 2023-10-11 MED ORDER — HEPARIN SOD (PORK) LOCK FLUSH 100 UNIT/ML IV SOLN
500.0000 [IU] | Freq: Once | INTRAVENOUS | Status: AC | PRN
  Administered 2023-10-11: 500 [IU]
  Filled 2023-10-11: qty 5

## 2023-10-11 MED ORDER — SODIUM CHLORIDE 0.9% FLUSH
10.0000 mL | INTRAVENOUS | Status: DC | PRN
  Administered 2023-10-11: 10 mL
  Filled 2023-10-11: qty 10

## 2023-10-11 MED ORDER — SODIUM CHLORIDE 0.9 % IV SOLN
420.0000 mg | Freq: Once | INTRAVENOUS | Status: AC
  Administered 2023-10-11: 420 mg via INTRAVENOUS
  Filled 2023-10-11: qty 14

## 2023-10-11 NOTE — Patient Instructions (Signed)
[facility]  Discharge Instructions: Thank you for choosing [facility] to provide your oncology and hematology care.  If you have a lab appointment with the Cancer Center, please go directly to the Cancer Center and check in at the registration area.  Wear comfortable clothing and clothing appropriate for easy access to any Portacath or PICC line.   We strive to give you quality time with your provider. You may need to reschedule your appointment if you arrive late (15 or more minutes).  Arriving late affects you and other patients whose appointments are after yours.  Also, if you miss three or more appointments without notifying the office, you may be dismissed from the clinic at the provider's discretion.      For prescription refill requests, have your pharmacy contact our office and allow 72 hours for refills to be completed.    Today you received the following chemotherapy and/or immunotherapy agents- Trastuzumab, Pertuzumab      To help prevent nausea and vomiting after your treatment, we encourage you to take your nausea medication as directed.  BELOW ARE SYMPTOMS THAT SHOULD BE REPORTED IMMEDIATELY: *FEVER GREATER THAN 100.4 F (38 C) OR HIGHER *CHILLS OR SWEATING *NAUSEA AND VOMITING THAT IS NOT CONTROLLED WITH YOUR NAUSEA MEDICATION *UNUSUAL SHORTNESS OF BREATH *UNUSUAL BRUISING OR BLEEDING *URINARY PROBLEMS (pain or burning when urinating, or frequent urination) *BOWEL PROBLEMS (unusual diarrhea, constipation, pain near the anus) TENDERNESS IN MOUTH AND THROAT WITH OR WITHOUT PRESENCE OF ULCERS (sore throat, sores in mouth, or a toothache) UNUSUAL RASH, SWELLING OR PAIN  UNUSUAL VAGINAL DISCHARGE OR ITCHING   Items with * indicate a potential emergency and should be followed up as soon as possible or go to the Emergency Department if any problems should occur.  Please show the CHEMOTHERAPY ALERT CARD or IMMUNOTHERAPY ALERT CARD  at check-in to the Emergency Department and triage nurse.  Should you have questions after your visit or need to cancel or reschedule your appointment, please contact [facility]  [phone] and follow the prompts.  Office hours are 8:00 a.m. to 4:30 p.m. Monday - Friday. Please note that voicemails left after 4:00 p.m. may not be returned until the following business day.  We are closed weekends and major holidays. You have access to a nurse at all times for urgent questions. Please call the main number to the clinic [phone] and follow the prompts.  For any non-urgent questions, you may also contact your provider using MyChart. We now offer e-Visits for anyone 27 and older to request care online for non-urgent symptoms. For details visit [email or web address].   Also download the MyChart app! Go to the app store, search "MyChart", open the app, select [facility], and log in with your MyChart username and password.

## 2023-10-12 ENCOUNTER — Ambulatory Visit
Admission: RE | Admit: 2023-10-12 | Discharge: 2023-10-12 | Disposition: A | Discharge status: Home / Self Care | Payer: Managed Care, Other (non HMO) | Source: Ambulatory Visit | Attending: Radiation Oncology | Admitting: Radiation Oncology

## 2023-10-12 ENCOUNTER — Ambulatory Visit: Payer: Managed Care, Other (non HMO)

## 2023-10-12 ENCOUNTER — Other Ambulatory Visit: Payer: Self-pay

## 2023-10-12 DIAGNOSIS — Z510 Encounter for antineoplastic radiation therapy: Secondary | ICD-10-CM | POA: Diagnosis not present

## 2023-10-12 LAB — RAD ONC ARIA SESSION SUMMARY
Course Elapsed Days: 44
Plan Fractions Treated to Date: 4
Plan Prescribed Dose Per Fraction: 2 Gy
Plan Total Fractions Prescribed: 5
Plan Total Prescribed Dose: 10 Gy
Reference Point Dosage Given to Date: 8 Gy
Reference Point Session Dosage Given: 2 Gy
Session Number: 32

## 2023-10-13 ENCOUNTER — Encounter: Payer: Self-pay | Admitting: *Deleted

## 2023-10-13 ENCOUNTER — Other Ambulatory Visit: Payer: Self-pay

## 2023-10-13 ENCOUNTER — Ambulatory Visit
Admission: RE | Admit: 2023-10-13 | Discharge: 2023-10-13 | Disposition: A | Discharge status: Home / Self Care | Payer: Managed Care, Other (non HMO) | Source: Ambulatory Visit | Attending: Radiation Oncology | Admitting: Radiation Oncology

## 2023-10-13 DIAGNOSIS — Z510 Encounter for antineoplastic radiation therapy: Secondary | ICD-10-CM | POA: Diagnosis not present

## 2023-10-13 LAB — RAD ONC ARIA SESSION SUMMARY
Course Elapsed Days: 45
Plan Fractions Treated to Date: 5
Plan Prescribed Dose Per Fraction: 2 Gy
Plan Total Fractions Prescribed: 5
Plan Total Prescribed Dose: 10 Gy
Reference Point Dosage Given to Date: 10 Gy
Reference Point Session Dosage Given: 2 Gy
Session Number: 33

## 2023-10-17 ENCOUNTER — Other Ambulatory Visit: Payer: Self-pay | Admitting: Internal Medicine

## 2023-10-17 DIAGNOSIS — F909 Attention-deficit hyperactivity disorder, unspecified type: Secondary | ICD-10-CM

## 2023-10-18 ENCOUNTER — Other Ambulatory Visit: Payer: Self-pay

## 2023-10-18 DIAGNOSIS — F909 Attention-deficit hyperactivity disorder, unspecified type: Secondary | ICD-10-CM

## 2023-10-18 MED ORDER — AMPHETAMINE-DEXTROAMPHETAMINE 20 MG PO TABS
20.0000 mg | ORAL_TABLET | Freq: Two times a day (BID) | ORAL | 0 refills | Status: DC
Start: 2023-10-18 — End: 2023-11-21

## 2023-10-19 ENCOUNTER — Ambulatory Visit
Admission: RE | Admit: 2023-10-19 | Discharge: 2023-10-19 | Disposition: A | Discharge status: Home / Self Care | Payer: Managed Care, Other (non HMO) | Source: Ambulatory Visit | Attending: Internal Medicine | Admitting: Internal Medicine

## 2023-10-19 DIAGNOSIS — C50912 Malignant neoplasm of unspecified site of left female breast: Secondary | ICD-10-CM | POA: Insufficient documentation

## 2023-10-19 DIAGNOSIS — Z0189 Encounter for other specified special examinations: Secondary | ICD-10-CM

## 2023-10-19 DIAGNOSIS — R002 Palpitations: Secondary | ICD-10-CM | POA: Insufficient documentation

## 2023-10-19 DIAGNOSIS — Z171 Estrogen receptor negative status [ER-]: Secondary | ICD-10-CM | POA: Diagnosis not present

## 2023-10-19 DIAGNOSIS — R0600 Dyspnea, unspecified: Secondary | ICD-10-CM | POA: Diagnosis present

## 2023-10-19 LAB — ECHOCARDIOGRAM COMPLETE
AR max vel: 2.54 cm2
AV Area VTI: 2.98 cm2
AV Area mean vel: 2.65 cm2
AV Mean grad: 2 mm[Hg]
AV Peak grad: 4.3 mm[Hg]
Ao pk vel: 1.04 m/s
Area-P 1/2: 5.31 cm2
S' Lateral: 2.9 cm

## 2023-10-19 NOTE — Progress Notes (Signed)
*  PRELIMINARY RESULTS* Echocardiogram 2D Echocardiogram has been performed.  Cristina Baker 10/19/2023, 10:33 AM

## 2023-10-25 ENCOUNTER — Encounter: Payer: Self-pay | Admitting: Internal Medicine

## 2023-11-01 ENCOUNTER — Inpatient Hospital Stay: Payer: Managed Care, Other (non HMO)

## 2023-11-01 ENCOUNTER — Encounter: Payer: Self-pay | Admitting: *Deleted

## 2023-11-01 ENCOUNTER — Other Ambulatory Visit: Payer: Self-pay | Admitting: *Deleted

## 2023-11-01 ENCOUNTER — Telehealth: Payer: Self-pay | Admitting: *Deleted

## 2023-11-01 ENCOUNTER — Inpatient Hospital Stay: Payer: Managed Care, Other (non HMO) | Attending: Internal Medicine | Admitting: Internal Medicine

## 2023-11-01 VITALS — HR 98

## 2023-11-01 VITALS — BP 126/82 | HR 105 | Temp 98.1°F | Wt 202.0 lb

## 2023-11-01 DIAGNOSIS — D6481 Anemia due to antineoplastic chemotherapy: Secondary | ICD-10-CM | POA: Diagnosis not present

## 2023-11-01 DIAGNOSIS — C50912 Malignant neoplasm of unspecified site of left female breast: Secondary | ICD-10-CM

## 2023-11-01 DIAGNOSIS — E876 Hypokalemia: Secondary | ICD-10-CM

## 2023-11-01 DIAGNOSIS — Z5112 Encounter for antineoplastic immunotherapy: Secondary | ICD-10-CM | POA: Diagnosis not present

## 2023-11-01 DIAGNOSIS — Z95828 Presence of other vascular implants and grafts: Secondary | ICD-10-CM

## 2023-11-01 DIAGNOSIS — C50412 Malignant neoplasm of upper-outer quadrant of left female breast: Secondary | ICD-10-CM | POA: Insufficient documentation

## 2023-11-01 DIAGNOSIS — T451X5A Adverse effect of antineoplastic and immunosuppressive drugs, initial encounter: Secondary | ICD-10-CM

## 2023-11-01 DIAGNOSIS — Z79899 Other long term (current) drug therapy: Secondary | ICD-10-CM | POA: Diagnosis not present

## 2023-11-01 DIAGNOSIS — R911 Solitary pulmonary nodule: Secondary | ICD-10-CM | POA: Insufficient documentation

## 2023-11-01 DIAGNOSIS — Z171 Estrogen receptor negative status [ER-]: Secondary | ICD-10-CM

## 2023-11-01 LAB — CBC WITH DIFFERENTIAL (CANCER CENTER ONLY)
Abs Immature Granulocytes: 0.01 10*3/uL (ref 0.00–0.07)
Basophils Absolute: 0 10*3/uL (ref 0.0–0.1)
Basophils Relative: 0 %
Eosinophils Absolute: 0.1 10*3/uL (ref 0.0–0.5)
Eosinophils Relative: 2 %
HCT: 34.4 % — ABNORMAL LOW (ref 36.0–46.0)
Hemoglobin: 11.4 g/dL — ABNORMAL LOW (ref 12.0–15.0)
Immature Granulocytes: 0 %
Lymphocytes Relative: 28 %
Lymphs Abs: 1.3 10*3/uL (ref 0.7–4.0)
MCH: 27.6 pg (ref 26.0–34.0)
MCHC: 33.1 g/dL (ref 30.0–36.0)
MCV: 83.3 fL (ref 80.0–100.0)
Monocytes Absolute: 0.4 10*3/uL (ref 0.1–1.0)
Monocytes Relative: 8 %
Neutro Abs: 2.9 10*3/uL (ref 1.7–7.7)
Neutrophils Relative %: 62 %
Platelet Count: 288 10*3/uL (ref 150–400)
RBC: 4.13 MIL/uL (ref 3.87–5.11)
RDW: 13.6 % (ref 11.5–15.5)
WBC Count: 4.7 10*3/uL (ref 4.0–10.5)
nRBC: 0 % (ref 0.0–0.2)

## 2023-11-01 LAB — CMP (CANCER CENTER ONLY)
ALT: 26 U/L (ref 0–44)
AST: 21 U/L (ref 15–41)
Albumin: 3.7 g/dL (ref 3.5–5.0)
Alkaline Phosphatase: 85 U/L (ref 38–126)
Anion gap: 10 (ref 5–15)
BUN: 19 mg/dL (ref 6–20)
CO2: 25 mmol/L (ref 22–32)
Calcium: 9.5 mg/dL (ref 8.9–10.3)
Chloride: 106 mmol/L (ref 98–111)
Creatinine: 0.71 mg/dL (ref 0.44–1.00)
GFR, Estimated: >60 mL/min (ref >60)
Glucose, Bld: 100 mg/dL — ABNORMAL HIGH (ref 70–99)
Potassium: 3.3 mmol/L — ABNORMAL LOW (ref 3.5–5.1)
Sodium: 141 mmol/L (ref 135–145)
Total Bilirubin: 0.6 mg/dL (ref <1.2)
Total Protein: 7 g/dL (ref 6.5–8.1)

## 2023-11-01 MED ORDER — HEPARIN SOD (PORK) LOCK FLUSH 100 UNIT/ML IV SOLN
500.0000 [IU] | Freq: Once | INTRAVENOUS | Status: AC | PRN
  Filled 2023-11-01: qty 5

## 2023-11-01 MED ORDER — SODIUM CHLORIDE 0.9% FLUSH
10.0000 mL | INTRAVENOUS | Status: AC | PRN
Start: 2023-11-01 — End: ?
  Administered 2023-11-01: 10 mL via INTRAVENOUS
  Filled 2023-11-01: qty 10

## 2023-11-01 MED ORDER — ACETAMINOPHEN 325 MG PO TABS
650.0000 mg | ORAL_TABLET | Freq: Once | ORAL | Status: AC
Start: 2023-11-01 — End: 2023-11-01
  Administered 2023-11-01: 650 mg via ORAL
  Filled 2023-11-01: qty 2

## 2023-11-01 MED ORDER — HEPARIN SOD (PORK) LOCK FLUSH 100 UNIT/ML IV SOLN
500.0000 [IU] | Freq: Once | INTRAVENOUS | Status: AC
Start: 2023-11-01 — End: ?
  Filled 2023-11-01: qty 5

## 2023-11-01 MED ORDER — SODIUM CHLORIDE 0.9 % IV SOLN
Freq: Once | INTRAVENOUS | Status: AC
  Filled 2023-11-01: qty 250

## 2023-11-01 MED ORDER — POTASSIUM CHLORIDE CRYS ER 20 MEQ PO TBCR: 20.0000 meq | EXTENDED_RELEASE_TABLET | Freq: Every day | ORAL | 0 refills | Status: DC

## 2023-11-01 MED ORDER — TRASTUZUMAB-ANNS CHEMO 150 MG IV SOLR
6.0000 mg/kg | Freq: Once | INTRAVENOUS | Status: AC
  Administered 2023-11-01: 525 mg via INTRAVENOUS
  Filled 2023-11-01: qty 25

## 2023-11-01 MED ORDER — SODIUM CHLORIDE 0.9 % IV SOLN
420.0000 mg | Freq: Once | INTRAVENOUS | Status: AC
  Administered 2023-11-01: 420 mg via INTRAVENOUS
  Filled 2023-11-01: qty 14

## 2023-11-01 MED ORDER — DIPHENHYDRAMINE HCL 25 MG PO CAPS
50.0000 mg | ORAL_CAPSULE | Freq: Once | ORAL | Status: AC
  Administered 2023-11-01: 50 mg via ORAL
  Filled 2023-11-01: qty 2

## 2023-11-01 NOTE — Progress Notes (Signed)
[facility] CONSULT NOTE  Patient Care Team: Sherron Monday, MD as PCP - General (Internal Medicine) Hulen Luster, RN as Oncology Nurse Navigator Michaelyn Barter, MD as Consulting Physician (Oncology) Carmina Miller, MD as Consulting Physician (Radiation Oncology)  CANCER STAGING   Cancer Staging  Breast cancer [facility]) Staging form: Breast, AJCC 8th Edition - Clinical: Stage IIB (cT2, cN1, cM0, G3, ER-, PR-, HER2+) - Signed by Michaelyn Barter, MD on 02/17/2023 Histologic grading system: 3 grade system   ASSESSMENT & PLAN:  Cristina Baker 37 y.o. 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  # Left breast invasive ductal cancer, ER/PR negative, HER2 positive, stage IIb -Self detected left breast mass.  Status post biopsy on 02/10/2023.  -CT chest abdomen pelvis (03/01/2023)- Ill-defined mixed focus in the posterior right ninth rib corresponding with increased radiotracer uptake on the bone scan.  Nonspecific but suspicious for osseous metastatic disease.  I discussed the finding with Dr. Archer Asa of IR who advised that the lesion is very small in size about 6 mm and is difficult to biopsy. Repeat CT chest on 04/15/2023 showed unchanged right ninth rib lesion.  - Completed neoadjuvant TCHP x 6 cycle.   - s/p left breast lumpectomy with SLNB with Dr. Tonna Boehringer on 07/28/2023.  Pathology showed path CR.  0/3 lymph nodes negative for malignancy. -Completed adjuvant radiation from 08/29/2023 to 10/13/2023. -Has been on maintenance Herceptin and Perjeta.  Tolerating well.  We are getting labs with alternate cycles.  Labs today reviewed and acceptable for treatment. -Echo reviewed from November 2024 which showed EF of 55 to 60%.  Will repeat another echo in 3 months. -She is due for diagnostic bilateral mammogram in February 2025.  #  Hypokalemia -K3.3.  Sent prescription for K-Dur 20 milliequivalents once daily for 7 days.  # Right ninth rib lesion -Questionable.  Last CT in May 2024 showed stable 7 mm lesion.  Case was previously discussed with Dr. Ruthe Mannan from IR and lesion was too small to biopsy. -Repeat CT in May and August 2024 has remained stable.  Last CT from August 2024 showed new 3 mm subpleural nodule in the left lower lobe likely inflammatory.  Surveillance was recommended.  Will repeat CT in about 6 weeks time.  If stable, will not need further surveillance.  I discussed with the patient this is highly unlikely to be related to breast cancer considering she had excellent response on treatment.  # Chemotherapy-induced neuropathy -Managed without medications  # Family history of breast cancer -Genetic testing was negative  # Anxiety -Xanax 0.25 mg twice daily as needed  # History of palpitations  -Reportedly, she had workup with cardiology which was negative.  It was deemed secondary to panic. -On metoprolol.  # Access-port placed by Dr. Tonna Boehringer.  Has Emla cream  Orders Placed This Encounter  Procedures   CT Chest W Contrast    Standing Status:   Future    Standing Expiration Date:   10/31/2024    Order Specific Question:   If indicated for the ordered procedure, I authorize the administration of contrast media per Radiology protocol    Answer:   Yes    Order Specific Question:   Does the patient have a contrast media/X-ray dye allergy?    Answer:   No    Order Specific Question:   Is patient pregnant?  Answer:   No    Order Specific Question:   Preferred imaging location?    Answer:   [facility]   CBC with Differential (Cancer Center Only)    Standing Status:   Future    Number of Occurrences:   1    Standing Expiration Date:   10/31/2024   CMP (Cancer Center only)    Standing Status:   Future    Number of Occurrences:   1    Standing Expiration Date:   10/31/2024   Comprehensive  metabolic panel    Standing Status:   Future    Standing Expiration Date:   10/31/2024   CBC with Differential/Platelet    Standing Status:   Future    Standing Expiration Date:   10/31/2024   RTC in 3 weeks for Herceptin Perjeta RTC in 6 weeks for MD visit, labs, Herceptin Perjeta.  The total time spent in the appointment was 30 minutes encounter with patients including review of chart and various tests results, discussions about plan of care and coordination of care plan   All questions were answered. The patient knows to call the clinic with any problems, questions or concerns. No barriers to learning was detected.  Michaelyn Barter, MD 11/19/202412:14 PM   HISTORY OF PRESENTING ILLNESS:  Cristina Baker 37 y.o. 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  She denies any pain or tenderness in the breast.  Denies any discharge.  There is family history of breast cancer in paternal grandmother and 2 aunts.  She had a BRCA testing about 15 years ago which was negative.  She has completed family-planning.  Underwent tubal ligation.  She has 3 children 1 in 49s second in his teen and the youngest is 39 years old.    Interval history Patient was seen today as follow-up to start her maintenance Herceptin and Perjeta. Undergoing radiation.  Feeling tired.  Her body aches.  Continues to have neuropathy and feeling of her feet is sleeping when she wakes up in the morning.  Briefly used gabapentin did not think helped her so stopped.  We discussed about increasing the dose but she does not want to go back.  I have reviewed her chart and materials related to her cancer extensively and collaborated history with the patient. Summary of oncologic history is as follows: Oncology History  Breast cancer (HCC)  01/26/2023 Initial Diagnosis   Patient felt palpable lump in  left breast   02/10/2023 Mammogram     IMPRESSION: 1. There is a suspicious 24 mm mass at the site of palpable concern which is concerning for malignancy. It demonstrates 3 cm of associated calcifications. Recommend ultrasound-guided biopsy for definitive characterization with attention on post marker placement mammogram to assess for extent of calcifications in relation to the biopsy marking clip. 2. There is an indeterminate LEFT axillary lymph node. Recommend ultrasound-guided biopsy for definitive characterization. 3. No mammographic evidence of malignancy in the RIGHT breast.   02/10/2023 Pathology Results   DIAGNOSIS: A.  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  Size of invasive carcinoma: 1.7 cm in linear length in this sample Histologic grade of invasive carcinoma: Grade 3 Glandular/tubular differentiation score: 3 Nuclear pleomorphism score: 3 Mitotic rate score: 2 Total score: 8 Ductal carcinoma in situ: Present, high-grade Lymphovascular invasion: Indeterminate See comment.  B.  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   Estrogen Receptor (ER) Status: NEGATIVE (LESS THAN 1%)         Internal control cells positive Progesterone Receptor (PgR) Status: Negative HER2 (by immunohistochemistry): Positive Ki-67: Not performed    02/17/2023 Cancer Staging   Staging form: Breast, AJCC 8th Edition - Clinical: Stage IIB (cT2, cN1, cM0, G3, ER-, PR-, HER2+) - Signed by Michaelyn Barter, MD on 02/17/2023 Histologic grading system: 3 grade system   03/07/2023 - 06/21/2023 Chemotherapy   Patient is on Treatment Plan : BREAST  Docetaxel + Carboplatin + Trastuzumab + Pertuzumab  (TCHP) q21d       Genetic Testing   Negative genetic testing. No pathogenic variants identified on the [facility] Multi-Cancer+RNA panel. The report date is 03/01/2023.  The  Multi-Cancer + RNA Panel offered by [facility] includes sequencing and/or deletion/duplication analysis of the following 70 genes:  AIP*, ALK, APC*, ATM*, AXIN2*, BAP1*, BARD1*, BLM*, BMPR1A*, BRCA1*, BRCA2*, BRIP1*, CDC73*, CDH1*, CDK4, CDKN1B*, CDKN2A, CHEK2*, CTNNA1*, DICER1*, EPCAM, EGFR, FH*, FLCN*, GREM1, HOXB13, KIT, LZTR1, MAX*, MBD4, MEN1*, MET, MITF, MLH1*, MSH2*, MSH3*, MSH6*, MUTYH*, NF1*, NF2*, NTHL1*, PALB2*, PDGFRA, PMS2*, POLD1*, POLE*, POT1*, PRKAR1A*, PTCH1*, PTEN*, RAD51C*, RAD51D*, RB1*, RET, SDHA*, SDHAF2*, SDHB*, SDHC*, SDHD*, SMAD4*, SMARCA4*, SMARCB1*, SMARCE1*, STK11*, SUFU*, TMEM127*, TP53*, TSC1*, TSC2*, VHL*. RNA analysis is performed for * genes.   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    Family history of breast cancer 2015   IBIS=18.8%   Family history of ovarian cancer    Heart palpitations    Herpes genitalis    Hypomagnesemia    Leg swelling    Leukocytosis    Lichen sclerosus    Mixed hyperlipidemia    PAC (premature atrial contraction)    PONV (postoperative nausea and vomiting)    nausea   PVC's (premature ventricular contractions)    Tachycardia, unspecified     SURGICAL HISTORY: Past Surgical History:  Procedure Laterality Date   BREAST BIOPSY Left 02/10/2023   invasive mammary carcinoma   BREAST BIOPSY Left 02/10/2023   [place] Core Hydromark clip path pending   BREAST BIOPSY Left 02/10/2023   [place] LT BREAST BX W LOC DEV 1ST LESION IMG BX SPEC  US GUIDE 02/10/2023 [facility]   BREAST BIOPSY Left 06/30/2023   [place] LT RADIO FREQUENCY TAG EA ADD LESION LOC US GUIDE 06/30/2023 [facility]   BREAST LUMPECTOMY,RADIO FREQ LOCALIZER,AXILLARY SENTINEL LYMPH NODE BIOPSY Left 07/28/2023   Procedure: BREAST LUMPECTOMY,RADIO FREQ LOCALIZER,AXILLARY SENTINEL LYMPH NODE BIOPSY;  Surgeon: Sung Amabile, DO;  Location: [facility];  Service: General;  Laterality: Left;   CESAREAN SECTION  01/03/2007   CESAREAN SECTION WITH BILATERAL TUBAL LIGATION N/A 03/26/2016   Procedure: CESAREAN SECTION WITH BILATERAL TUBAL LIGATION;  Surgeon: Conard Novak, MD;  Location: [facility];  Service: Obstetrics;  Laterality:  N/A;   COLPOSCOPY     IUD REMOVAL     PORTACATH PLACEMENT N/A 02/24/2023   Procedure: INSERTION PORT-A-CATH;  Surgeon: Sung Amabile, DO;  Location: [facility];  Service: General;  Laterality: N/A;    SOCIAL HISTORY: Social History   Socioeconomic History   Marital status: Married    Spouse name: Verdon Cummins   Number of children: 3   Years of education: Not on file   Highest education level: Not on file  Occupational History   Not on file  Tobacco Use   Smoking status: Former    Current packs/day: 0.00    Types: Cigarettes    Quit date: 2022    Years since quitting: 2.8   Smokeless tobacco: Never  Vaping Use   Vaping status: Never Used  Substance and Sexual Activity   Alcohol use: Yes    Comment: rarely   Drug use: No   Sexual activity: Yes    Birth control/protection: Surgical    Comment: tubal ligation  Other Topics Concern   Not on file  Social History Narrative   Not on file   Social Determinants of Health   Financial Resource Strain: Low Risk  (04/05/2023)   Overall Financial Resource Strain (CARDIA)    Difficulty of Paying Living Expenses: Not very hard  Food Insecurity: No Food Insecurity (04/05/2023)   Hunger Vital Sign    Worried About Running Out of Food in the Last Year: Never true    Ran Out of Food in the Last  Year: Never true  Transportation Needs: No Transportation Needs (02/17/2023)   PRAPARE - Administrator, Civil Service (Medical): No    Lack of Transportation (Non-Medical): No  Physical Activity: Inactive (04/05/2023)   Exercise Vital Sign    Days of Exercise per Week: 0 days    Minutes of Exercise per Session: 0 min  Stress: Stress Concern Present (04/05/2023)   [employer] of Occupational Health - Occupational Stress Questionnaire    Feeling of Stress : To some extent  Social Connections: Socially Integrated (04/05/2023)   Social Connection and Isolation Panel [NHANES]    Frequency of Communication with Friends and Family: Three times a week    Frequency of Social Gatherings with Friends and Family: Once a week    Attends Religious Services: 1 to 4 times per year    Active Member of [employer] or Organizations: Yes    Attends Banker Meetings: 1 to 4 times per year    Marital Status: Married  Catering manager Violence: Not At Risk (02/17/2023)   Humiliation, Afraid, Rape, and Kick questionnaire    Fear of Current or Ex-Partner: No    Emotionally Abused: No    Physically Abused: No    Sexually Abused: No    FAMILY HISTORY: Family History  Problem Relation Age of Onset   Hypercholesterolemia Mother    Diabetes Mellitus II Father    Hypertension Father    Breast cancer Paternal Aunt 74   Breast cancer Paternal Aunt 77   Breast cancer Paternal Grandmother        24s   Throat cancer Paternal Grandfather    Heart attack Paternal Grandfather     ALLERGIES:  has No Known Allergies.  MEDICATIONS:  Current Outpatient Medications  Medication Sig Dispense Refill   ALPRAZolam (XANAX) 0.5 MG tablet USE AS DIRECTED TAKE HALF TAB TWICE DAILY FOR ANXIETY 15 tablet 0   amphetamine-dextroamphetamine (ADDERALL) 20 MG tablet Take 1 tablet (20  mg total) by mouth 2 (two) times daily. 60 tablet 0   aspirin-acetaminophen-caffeine (EXCEDRIN MIGRAINE) 250-250-65 MG tablet  Take 1 tablet by mouth every 6 (six) hours as needed for headache.     clobetasol ointment (TEMOVATE) 0.05 % Apply to affected area once wkly as maintenance 45 g 0   escitalopram (LEXAPRO) 10 MG tablet Take 1 tablet (10 mg total) by mouth daily. 90 tablet 1   metoprolol succinate (TOPROL-XL) 25 MG 24 hr tablet TAKE 1 TABLET BY MOUTH ONCE A DAY **MAKE FOLLOW UP APPT WITH DR. KHAN FOR REFILLS** 90 tablet 0   potassium chloride SA (KLOR-CON M) 20 MEQ tablet Take 1 tablet (20 mEq total) by mouth daily for 7 days. 7 tablet 0   silver sulfADIAZINE (SILVADENE) 1 % cream Apply 1 Application topically 2 (two) times daily. Apply to affected area as directed. 50 g 1   ibuprofen (ADVIL) 800 MG tablet Take 1 tablet (800 mg total) by mouth every 8 (eight) hours as needed for mild pain or moderate pain. 30 tablet 0   No current facility-administered medications for this visit.   Facility-Administered Medications Ordered in Other Visits  Medication Dose Route Frequency Provider Last Rate Last Admin   heparin lock flush 100 unit/mL  500 Units Intravenous Once Michaelyn Barter, MD       heparin lock flush 100 unit/mL  500 Units Intravenous Once Michaelyn Barter, MD       heparin lock flush 100 unit/mL  500 Units Intracatheter Once PRN Michaelyn Barter, MD       pertuzumab (PERJETA) 420 mg in sodium chloride 0.9 % 250 mL chemo infusion  420 mg Intravenous Once Michaelyn Barter, MD 528 mL/hr at 11/01/23 1210 420 mg at 11/01/23 1210   sodium chloride flush (NS) 0.9 % injection 10 mL  10 mL Intravenous PRN Michaelyn Barter, MD   10 mL at 11/01/23 1021    REVIEW OF SYSTEMS:   Pertinent information mentioned in HPI All other systems were reviewed with the patient and are negative.  PHYSICAL EXAMINATION: ECOG PERFORMANCE STATUS: 0 - Asymptomatic  Vitals:   11/01/23 0949  BP: 126/82  Pulse: (!) 105  Temp: 98.1 F (36.7 C)  SpO2: 98%    Filed Weights   11/01/23 0949  Weight: 202 lb (91.6 kg)      GENERAL:alert, no distress and comfortable SKIN: skin color, texture, turgor are normal, no rashes or significant lesions EYES: normal, conjunctiva are pink and non-injected, sclera clear OROPHARYNX:no exudate, no erythema and lips, buccal mucosa, and tongue normal  NECK: supple, thyroid normal size, non-tender, without nodularity LYMPH:  no palpable lymphadenopathy in the cervical, axillary or inguinal LUNGS: clear to auscultation and percussion with normal breathing effort HEART: regular rate & rhythm and no murmurs and no lower extremity edema ABDOMEN:abdomen soft, non-tender and normal bowel sounds Musculoskeletal:no cyanosis of digits and no clubbing  PSYCH: alert & oriented x 3 with fluent speech NEURO: no focal motor/sensory deficits  LABORATORY DATA:  I have reviewed the data as listed Lab Results  Component Value Date   WBC 4.7 11/01/2023   HGB 11.4 (L) 11/01/2023   HCT 34.4 (L) 11/01/2023   MCV 83.3 11/01/2023   PLT 288 11/01/2023   Recent Labs    07/12/23 1050 08/30/23 1027 11/01/23 1018  NA 137 138 141  K 3.5 3.4* 3.3*  CL 107 106 106  CO2 24 25 25   GLUCOSE 117* 113* 100*  BUN 16 19 19   CREATININE 0.60  0.59 0.71  CALCIUM 9.1 9.5 9.5  GFRNONAA >60 >60 >60  PROT 6.2* 7.3 7.0  ALBUMIN 3.3* 3.7 3.7  AST 22 19 21   ALT 41 21 26  ALKPHOS 56 66 85  BILITOT 0.3 0.5 0.6    RADIOGRAPHIC STUDIES: I have personally reviewed the radiological images as listed and agreed with the findings in the report. ECHOCARDIOGRAM COMPLETE  Result Date: 10/19/2023    ECHOCARDIOGRAM REPORT   Patient Name:   ERIKO OBRION Date of Exam: 10/19/2023 Medical Rec #:  [id number]        Height:       66.0 in Accession #:    [id number]       Weight:       195.0 lb Date of Birth:  01/19/1986        BSA:          1.978 m Patient Age:    37 years         BP:           127/78 mmHg Patient Gender: F                HR:           85 bpm. Exam Location:  [facility] Procedure: 2D Echo, Cardiac  Doppler, Color Doppler and Strain Analysis Indications:     Chemo Z09  History:         Patient has prior history of Echocardiogram examinations, most                  recent 05/30/2023. Signs/Symptoms:Dyspnea. Palpitations.  Sonographer:     Cristela Blue Referring Phys:  [id number] Michaelyn Barter Diagnosing Phys: Julien Nordmann MD  Sonographer Comments: Global longitudinal strain was attempted. IMPRESSIONS  1. Left ventricular ejection fraction, by estimation, is 55 to 60%. The left ventricle has normal function. The left ventricle has no regional wall motion abnormalities. Left ventricular diastolic parameters were normal. The average left ventricular global longitudinal strain is -5.6 %.  2. Right ventricular systolic function is normal. The right ventricular size is normal. There is normal pulmonary artery systolic pressure. The estimated right ventricular systolic pressure is 19.1 mmHg.  3. The mitral valve is normal in structure. Mild mitral valve regurgitation. No evidence of mitral stenosis.  4. The aortic valve is normal in structure. Aortic valve regurgitation is not visualized. No aortic stenosis is present.  5. The inferior vena cava is normal in size with greater than 50% respiratory variability, suggesting right atrial pressure of 3 mmHg. FINDINGS  Left Ventricle: Left ventricular ejection fraction, by estimation, is 55 to 60%. The left ventricle has normal function. The left ventricle has no regional wall motion abnormalities. The average left ventricular global longitudinal strain is -5.6 %. The  left ventricular internal cavity size was normal in size. There is no left ventricular hypertrophy. Left ventricular diastolic parameters were normal. Right Ventricle: The right ventricular size is normal. No increase in right ventricular wall thickness. Right ventricular systolic function is normal. There is normal pulmonary artery systolic pressure. The tricuspid regurgitant velocity is 1.88 m/s, and  with an  assumed right atrial pressure of 5 mmHg, the estimated right ventricular systolic pressure is 19.1 mmHg. Left Atrium: Left atrial size was normal in size. Right Atrium: Right atrial size was normal in size. Pericardium: There is no evidence of pericardial effusion. Mitral Valve: The mitral valve is normal in structure. Mild mitral valve regurgitation. No evidence of mitral valve stenosis. Tricuspid  Valve: The tricuspid valve is normal in structure. Tricuspid valve regurgitation is not demonstrated. No evidence of tricuspid stenosis. Aortic Valve: The aortic valve is normal in structure. Aortic valve regurgitation is not visualized. No aortic stenosis is present. Aortic valve mean gradient measures 2.0 mmHg. Aortic valve peak gradient measures 4.3 mmHg. Aortic valve area, by VTI measures 2.98 cm. Pulmonic Valve: The pulmonic valve was normal in structure. Pulmonic valve regurgitation is not visualized. No evidence of pulmonic stenosis. Aorta: The aortic root is normal in size and structure. Venous: The inferior vena cava is normal in size with greater than 50% respiratory variability, suggesting right atrial pressure of 3 mmHg. IAS/Shunts: No atrial level shunt detected by color flow Doppler.  LEFT VENTRICLE PLAX 2D LVIDd:         4.20 cm   Diastology LVIDs:         2.90 cm   LV e' medial:    5.87 cm/s LV PW:         0.90 cm   LV E/e' medial:  13.3 LV IVS:        0.90 cm   LV e' lateral:   13.40 cm/s LVOT diam:     2.00 cm   LV E/e' lateral: 5.8 LV SV:         47 LV SV Index:   24        2D Longitudinal Strain LVOT Area:     3.14 cm  2D Strain GLS Avg:     -5.6 %  RIGHT VENTRICLE RV Basal diam:  3.40 cm RV Mid diam:    2.50 cm RV S prime:     9.79 cm/s TAPSE (M-mode): 1.9 cm LEFT ATRIUM             Index       RIGHT ATRIUM           Index LA diam:        2.20 cm 1.11 cm/m  RA Area:     12.70 cm LA Vol (A2C):   13.8 ml 6.98 ml/m  RA Volume:   26.90 ml  13.60 ml/m LA Vol (A4C):   14.3 ml 7.23 ml/m LA Biplane  Vol: 14.3 ml 7.23 ml/m  AORTIC VALVE AV Area (Vmax):    2.54 cm AV Area (Vmean):   2.65 cm AV Area (VTI):     2.98 cm AV Vmax:           103.50 cm/s AV Vmean:          71.500 cm/s AV VTI:            0.159 m AV Peak Grad:      4.3 mmHg AV Mean Grad:      2.0 mmHg LVOT Vmax:         83.80 cm/s LVOT Vmean:        60.200 cm/s LVOT VTI:          0.151 m LVOT/AV VTI ratio: 0.95  AORTA Ao Root diam: 2.80 cm MITRAL VALVE               TRICUSPID VALVE MV Area (PHT): 5.31 cm    TR Peak grad:   14.1 mmHg MV Decel Time: 143 msec    TR Vmax:        188.00 cm/s MV E velocity: 78.10 cm/s MV A velocity: 61.80 cm/s  SHUNTS MV E/A ratio:  1.26        Systemic VTI:  0.15  m                            Systemic Diam: 2.00 cm Julien Nordmann MD Electronically signed by Julien Nordmann MD Signature Date/Time: 10/19/2023/1:06:14 PM    Final

## 2023-11-01 NOTE — Progress Notes (Signed)
Patient wants to know if she can get a flu shot or not?

## 2023-11-01 NOTE — Telephone Encounter (Signed)
Message left on voicemail that prescription had been sent to [facility] in [place] for Potassium 20 meq once daily for 7 days per Dr Alena Bills due to low potassium level of 3.3.

## 2023-11-01 NOTE — Patient Instructions (Signed)
[facility] - A DEPT OF [facility]  Discharge Instructions: Thank you for choosing [facility] to provide your oncology and hematology care.  If you have a lab appointment with the Cancer Center, please go directly to the Cancer Center and check in at the registration area.  Wear comfortable clothing and clothing appropriate for easy access to any Portacath or PICC line.   We strive to give you quality time with your provider. You may need to reschedule your appointment if you arrive late (15 or more minutes).  Arriving late affects you and other patients whose appointments are after yours.  Also, if you miss three or more appointments without notifying the office, you may be dismissed from the clinic at the provider's discretion.      For prescription refill requests, have your pharmacy contact our office and allow 72 hours for refills to be completed.    Today you received the following chemotherapy and/or immunotherapy agents- Trastuzumab, Pertuzumab      To help prevent nausea and vomiting after your treatment, we encourage you to take your nausea medication as directed.  BELOW ARE SYMPTOMS THAT SHOULD BE REPORTED IMMEDIATELY: *FEVER GREATER THAN 100.4 F (38 C) OR HIGHER *CHILLS OR SWEATING *NAUSEA AND VOMITING THAT IS NOT CONTROLLED WITH YOUR NAUSEA MEDICATION *UNUSUAL SHORTNESS OF BREATH *UNUSUAL BRUISING OR BLEEDING *URINARY PROBLEMS (pain or burning when urinating, or frequent urination) *BOWEL PROBLEMS (unusual diarrhea, constipation, pain near the anus) TENDERNESS IN MOUTH AND THROAT WITH OR WITHOUT PRESENCE OF ULCERS (sore throat, sores in mouth, or a toothache) UNUSUAL RASH, SWELLING OR PAIN  UNUSUAL VAGINAL DISCHARGE OR ITCHING   Items with * indicate a potential emergency and should be followed up as soon as possible or go to the Emergency Department if any problems should occur.  Please show the CHEMOTHERAPY ALERT CARD or  IMMUNOTHERAPY ALERT CARD at check-in to the Emergency Department and triage nurse.  Should you have questions after your visit or need to cancel or reschedule your appointment, please contact [facility] - A DEPT OF [facility]  [phone] and follow the prompts.  Office hours are 8:00 a.m. to 4:30 p.m. Monday - Friday. Please note that voicemails left after 4:00 p.m. may not be returned until the following business day.  We are closed weekends and major holidays. You have access to a nurse at all times for urgent questions. Please call the main number to the clinic [phone] and follow the prompts.  For any non-urgent questions, you may also contact your provider using MyChart. We now offer e-Visits for anyone 67 and older to request care online for non-urgent symptoms. For details visit [email or web address].   Also download the MyChart app! Go to the app store, search "MyChart", open the app, select [place], and log in with your MyChart username and password.

## 2023-11-02 ENCOUNTER — Other Ambulatory Visit: Payer: Self-pay

## 2023-11-06 ENCOUNTER — Other Ambulatory Visit: Payer: Self-pay

## 2023-11-07 ENCOUNTER — Telehealth: Payer: Self-pay | Admitting: *Deleted

## 2023-11-07 ENCOUNTER — Encounter: Payer: Self-pay | Admitting: Internal Medicine

## 2023-11-07 NOTE — Telephone Encounter (Signed)
FMLA form received and completed and sent for physician signature

## 2023-11-08 ENCOUNTER — Encounter: Payer: Self-pay | Admitting: Internal Medicine

## 2023-11-08 NOTE — Telephone Encounter (Signed)
Form signed and copied for chart. MyChart message sent to patient that it is ready for her to pick up

## 2023-11-13 ENCOUNTER — Emergency Department
Admission: EM | Admit: 2023-11-13 | Discharge: 2023-11-13 | Disposition: A | Discharge status: Home / Self Care | Payer: Managed Care, Other (non HMO) | Attending: Emergency Medicine | Admitting: Emergency Medicine

## 2023-11-13 ENCOUNTER — Other Ambulatory Visit: Payer: Self-pay

## 2023-11-13 ENCOUNTER — Emergency Department: Payer: Managed Care, Other (non HMO)

## 2023-11-13 DIAGNOSIS — R911 Solitary pulmonary nodule: Secondary | ICD-10-CM | POA: Insufficient documentation

## 2023-11-13 DIAGNOSIS — Z853 Personal history of malignant neoplasm of breast: Secondary | ICD-10-CM | POA: Diagnosis not present

## 2023-11-13 DIAGNOSIS — N132 Hydronephrosis with renal and ureteral calculous obstruction: Secondary | ICD-10-CM | POA: Insufficient documentation

## 2023-11-13 DIAGNOSIS — N201 Calculus of ureter: Secondary | ICD-10-CM

## 2023-11-13 DIAGNOSIS — R109 Unspecified abdominal pain: Secondary | ICD-10-CM | POA: Diagnosis present

## 2023-11-13 LAB — COMPREHENSIVE METABOLIC PANEL
ALT: 25 U/L (ref 0–44)
AST: 18 U/L (ref 15–41)
Albumin: 3.7 g/dL (ref 3.5–5.0)
Alkaline Phosphatase: 88 U/L (ref 38–126)
Anion gap: 10 (ref 5–15)
BUN: 28 mg/dL — ABNORMAL HIGH (ref 6–20)
CO2: 24 mmol/L (ref 22–32)
Calcium: 9.3 mg/dL (ref 8.9–10.3)
Chloride: 106 mmol/L (ref 98–111)
Creatinine, Ser: 0.98 mg/dL (ref 0.44–1.00)
GFR, Estimated: >60 mL/min (ref >60)
Glucose, Bld: 145 mg/dL — ABNORMAL HIGH (ref 70–99)
Potassium: 3.4 mmol/L — ABNORMAL LOW (ref 3.5–5.1)
Sodium: 140 mmol/L (ref 135–145)
Total Bilirubin: 0.5 mg/dL (ref <1.2)
Total Protein: 7.1 g/dL (ref 6.5–8.1)

## 2023-11-13 LAB — CBC WITH DIFFERENTIAL/PLATELET
Abs Immature Granulocytes: 0.04 10*3/uL (ref 0.00–0.07)
Basophils Absolute: 0 10*3/uL (ref 0.0–0.1)
Basophils Relative: 0 %
Eosinophils Absolute: 0.2 10*3/uL (ref 0.0–0.5)
Eosinophils Relative: 2 %
HCT: 34.1 % — ABNORMAL LOW (ref 36.0–46.0)
Hemoglobin: 11.2 g/dL — ABNORMAL LOW (ref 12.0–15.0)
Immature Granulocytes: 1 %
Lymphocytes Relative: 26 %
Lymphs Abs: 2 10*3/uL (ref 0.7–4.0)
MCH: 27.5 pg (ref 26.0–34.0)
MCHC: 32.8 g/dL (ref 30.0–36.0)
MCV: 83.8 fL (ref 80.0–100.0)
Monocytes Absolute: 0.5 10*3/uL (ref 0.1–1.0)
Monocytes Relative: 7 %
Neutro Abs: 5.1 10*3/uL (ref 1.7–7.7)
Neutrophils Relative %: 64 %
Platelets: 297 10*3/uL (ref 150–400)
RBC: 4.07 MIL/uL (ref 3.87–5.11)
RDW: 13.9 % (ref 11.5–15.5)
WBC: 7.8 10*3/uL (ref 4.0–10.5)
nRBC: 0 % (ref 0.0–0.2)

## 2023-11-13 LAB — CBC
HCT: 33.5 % — ABNORMAL LOW (ref 36.0–46.0)
Hemoglobin: 11.2 g/dL — ABNORMAL LOW (ref 12.0–15.0)
MCH: 27.6 pg (ref 26.0–34.0)
MCHC: 33.4 g/dL (ref 30.0–36.0)
MCV: 82.5 fL (ref 80.0–100.0)
Platelets: 302 10*3/uL (ref 150–400)
RBC: 4.06 MIL/uL (ref 3.87–5.11)
RDW: 13.8 % (ref 11.5–15.5)
WBC: 7.8 10*3/uL (ref 4.0–10.5)
nRBC: 0 % (ref 0.0–0.2)

## 2023-11-13 LAB — URINALYSIS, ROUTINE W REFLEX MICROSCOPIC
Bacteria, UA: NONE SEEN
Bilirubin Urine: NEGATIVE
Glucose, UA: NEGATIVE mg/dL
Ketones, ur: NEGATIVE mg/dL
Leukocytes,Ua: NEGATIVE
Nitrite: NEGATIVE
Protein, ur: NEGATIVE mg/dL
Specific Gravity, Urine: >1.046 — ABNORMAL HIGH (ref 1.005–1.030)
WBC, UA: 0 WBC/hpf (ref 0–5)
pH: 5 (ref 5.0–8.0)

## 2023-11-13 LAB — LIPASE, BLOOD: Lipase: 26 U/L (ref 11–51)

## 2023-11-13 LAB — POC URINE PREG, ED: Preg Test, Ur: NEGATIVE

## 2023-11-13 MED ORDER — KETOROLAC TROMETHAMINE 15 MG/ML IJ SOLN
15.0000 mg | Freq: Once | INTRAMUSCULAR | Status: AC
  Administered 2023-11-13: 15 mg via INTRAVENOUS
  Filled 2023-11-13: qty 1

## 2023-11-13 MED ORDER — ONDANSETRON HCL 4 MG PO TABS: 4.0000 mg | ORAL_TABLET | Freq: Three times a day (TID) | ORAL | 0 refills | Status: DC | PRN

## 2023-11-13 MED ORDER — IOHEXOL 300 MG/ML  SOLN
100.0000 mL | Freq: Once | INTRAMUSCULAR | Status: AC | PRN
  Administered 2023-11-13: 100 mL via INTRAVENOUS

## 2023-11-13 MED ORDER — ONDANSETRON HCL 4 MG/2ML IJ SOLN
4.0000 mg | Freq: Once | INTRAMUSCULAR | Status: AC
Start: 2023-11-13 — End: 2023-11-13
  Administered 2023-11-13: 4 mg via INTRAVENOUS
  Filled 2023-11-13: qty 2

## 2023-11-13 MED ORDER — MORPHINE SULFATE (PF) 4 MG/ML IV SOLN
4.0000 mg | Freq: Once | INTRAVENOUS | Status: AC
  Administered 2023-11-13: 4 mg via INTRAVENOUS
  Filled 2023-11-13: qty 1

## 2023-11-13 MED ORDER — NAPROXEN 500 MG PO TABS: 500.0000 mg | ORAL_TABLET | Freq: Two times a day (BID) | ORAL | 0 refills | Status: AC

## 2023-11-13 MED ORDER — SODIUM CHLORIDE 0.9 % IV BOLUS
1000.0000 mL | Freq: Once | INTRAVENOUS | Status: AC
Start: 2023-11-13 — End: 2023-11-13
  Administered 2023-11-13: 1000 mL via INTRAVENOUS

## 2023-11-13 MED ORDER — OXYCODONE HCL 5 MG PO TABS: 5.0000 mg | ORAL_TABLET | Freq: Three times a day (TID) | ORAL | 0 refills | Status: DC | PRN

## 2023-11-13 NOTE — Discharge Instructions (Addendum)
You were found to have a 3 mm right ureteral stone.  You may take the medications as prescribed to help with your pain and nausea.  Remember that the oxycodone is highly addictive and should only be used for breakthrough pain.  Also remember that you cannot drive, operate heavy machinery, or perform any test that require concentration while taking this medication.  You can also not take this medication with any other sedating medications such as a benzodiazepine.  Please follow-up with urology if your symptoms persist.  Please return for any new, worsening, or change in symptoms or other concerns.  It was a pleasure caring for you today.

## 2023-11-13 NOTE — ED Triage Notes (Signed)
Pt comes with right sided belly pain and back pain. Pt states no urinary symptoms. Pt states she did throw up.

## 2023-11-13 NOTE — ED Notes (Signed)
See triage note  Presents with sudden onset of right flank and abd pain  Positive nausea  Unable to sit still d/t pain

## 2023-11-13 NOTE — ED Provider Notes (Signed)
[facility] Provider Note    Event Date/Time   First MD Initiated Contact with Patient 11/13/23 [phone]     (approximate)   History   Back Pain   HPI  Cristina Baker is a 37 y.o. 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  Patient reports that she awoke at approximately 3 AM with this pain.  She reports that the pain wraps around to her low right abdomen.  She had 1 episode of vomiting.  She is still nauseated.  No fevers or chills.  No dysuria or hematuria.  No black or bloody stool.  No diarrhea.  No history of kidney stones.  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   Chemotherapy induced diarrhea 03/28/2023   Leukocytosis 03/28/2023   Genetic testing 03/02/2023   Adult ADHD (attention deficit hyperactivity disorder) 02/22/2023   Mixed hyperlipidemia 02/22/2023   Anxiety 02/22/2023   Family history of breast cancer 02/18/2023   Breast cancer (HCC) 02/17/2023   Heart palpitations 04/09/2022   Premature atrial contractions 04/09/2022   Premature ventricular contractions 04/09/2022   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 01/09/2021   Lichen sclerosus 06/02/2020          Physical Exam   Triage Vital Signs: ED Triage Vitals [11/13/23 0747]  Encounter Vitals Group     BP 138/89      Systolic BP Percentile      Diastolic BP Percentile      Pulse Rate 70     Resp 18     Temp 98.4 F (36.9 C)     Temp src      SpO2 100 %     Weight 200 lb (90.7 kg)     Height 5\' 6"  (1.676 m)     Head Circumference      Peak Flow      Pain Score 10     Pain Loc      Pain Education      Exclude from Growth Chart     Most recent vital signs: Vitals:   11/13/23 0747  BP: 138/89  Pulse: 70  Resp: 18  Temp: 98.4 F (36.9 C)  SpO2: 100%    Physical Exam Vitals and nursing note reviewed.  Constitutional:      General: Awake and alert.  Uncomfortable appearing    Appearance: Normal appearance. The patient is normal weight.  HENT:     Head: Normocephalic and atraumatic.     Mouth: Mucous membranes are moist.  Eyes:     General: PERRL. Normal EOMs        Right eye: No discharge.        Left eye: No discharge.     Conjunctiva/sclera: Conjunctivae normal.  Cardiovascular:     Rate and Rhythm: Normal rate and regular rhythm.     Pulses: Normal pulses.  Pulmonary:     Effort:  Pulmonary effort is normal. No respiratory distress.     Breath sounds: Normal breath sounds.  Abdominal:     Abdomen is soft. There is no abdominal tenderness. No rebound or guarding. No distention.  No CVA tenderness Musculoskeletal:        General: No swelling. Normal range of motion.     Cervical back: Normal range of motion and neck supple.  Skin:    General: Skin is warm and dry.     Capillary Refill: Capillary refill takes less than 2 seconds.     Findings: No rash.  Neurological:     Mental Status: The patient is awake and alert.      ED Results / Procedures / Treatments   Labs (all labs ordered are listed, but only abnormal results are displayed) Labs Reviewed  COMPREHENSIVE METABOLIC PANEL - Abnormal; Notable for the following components:      Result Value   Potassium 3.4 (*)    Glucose, Bld 145 (*)    BUN 28 (*)    All other components within normal limits  CBC - Abnormal;  Notable for the following components:   Hemoglobin 11.2 (*)    HCT 33.5 (*)    All other components within normal limits  URINALYSIS, ROUTINE W REFLEX MICROSCOPIC - Abnormal; Notable for the following components:   Color, Urine YELLOW (*)    APPearance CLEAR (*)    Specific Gravity, Urine >1.046 (*)    Hgb urine dipstick SMALL (*)    All other components within normal limits  CBC WITH DIFFERENTIAL/PLATELET - Abnormal; Notable for the following components:   Hemoglobin 11.2 (*)    HCT 34.1 (*)    All other components within normal limits  LIPASE, BLOOD  POC URINE PREG, ED     EKG     RADIOLOGY I independently reviewed and interpreted imaging and agree with radiologists findings.     PROCEDURES:  Critical Care performed:   Procedures   MEDICATIONS ORDERED IN ED: Medications  morphine (PF) 4 MG/ML injection 4 mg (4 mg Intravenous Given 11/13/23 0816)  ondansetron (ZOFRAN) injection 4 mg (4 mg Intravenous Given 11/13/23 0815)  iohexol (OMNIPAQUE) 300 MG/ML solution 100 mL (100 mLs Intravenous Contrast Given 11/13/23 0846)  sodium chloride 0.9 % bolus 1,000 mL (0 mLs Intravenous Stopped 11/13/23 1122)  ketorolac (TORADOL) 15 MG/ML injection 15 mg (15 mg Intravenous Given 11/13/23 0911)     IMPRESSION / MDM / ASSESSMENT AND PLAN / ED COURSE  I reviewed the triage vital signs and the nursing notes.   Differential diagnosis includes, but is not limited to, nephrolithiasis, metastatic disease, pyelonephritis.  Patient is awake and alert, hemodynamically stable and afebrile, though uncomfortable in appearance.  She has no reproducible abdominal tenderness.  I reviewed the patient's chart.  She is currently on immunotherapy for breast cancer, has completed chemotherapy and radiation.  Further workup is indicated.  IV was established and labs are obtained.  Labs are overall reassuring, no leukocytosis, normal creatinine.  Given the subpleural nodule in the left lower lobe that  was seen on her CT chest in August, as well as the possible metastatic disease noted on her right posterior rib on the same scan, CT chest was obtained in addition to CT abdomen and pelvis.  This reveals a 3 mm calculus in the distal third of the right ureter, which is likely the source of her pain today.  Her urinalysis does not reveal any evidence of infection.  Urine pregnancy  is negative.  She was treated with morphine, Zofran, and subsequently Toradol with good effect.  She was also given a liter of IV fluids.  CT chest today also revealed a 3 mm right middle lobe pulmonary nodule which is new from the previous study.  Patient made aware and also I sent a message to her primary oncologist, Dr. Michaelyn Barter who acknowledged receipt.  Patient was feeling much more comfortable on reevaluation and comfortable with plan for discharge home.  She was given prescriptions for analgesia and antiemetic to use at home if her pain returns.  She was advised that oxycodone is highly addictive and should only be used for breakthrough pain.  She was also advised that she cannot drive, operate heavy machinery, or perform any test that require concentration while taking this medication.  She was also advised that she should not take this with any other sedating medications such as benzodiazepines which she is currently prescribed.  We did discussed return precautions and the importance of close outpatient follow-up with her oncologist as well as urology, and the appropriate follow-up information was provided for urology.  She understands return precautions in the meantime.  Patient was discharged in stable condition with her significant other.   Patient's presentation is most consistent with acute complicated illness / injury requiring diagnostic workup.   FINAL CLINICAL IMPRESSION(S) / ED DIAGNOSES   Final diagnoses:  Right ureteral stone  Pulmonary nodule     Rx / DC Orders   ED Discharge Orders           Ordered    ondansetron (ZOFRAN) 4 MG tablet  Every 8 hours PRN        11/13/23 1117    oxyCODONE (ROXICODONE) 5 MG immediate release tablet  Every 8 hours PRN        11/13/23 1117    naproxen (NAPROSYN) 500 MG tablet  2 times daily with meals        11/13/23 1117             Note:  This document was prepared using Dragon voice recognition software and may include unintentional dictation errors.   Jackelyn Hoehn, PA-C 11/13/23 1230    Janith Lima, MD 11/13/23 [phone]

## 2023-11-14 ENCOUNTER — Encounter: Payer: Self-pay | Admitting: *Deleted

## 2023-11-16 ENCOUNTER — Encounter: Payer: Self-pay | Admitting: Radiation Oncology

## 2023-11-16 ENCOUNTER — Ambulatory Visit
Admission: RE | Admit: 2023-11-16 | Discharge: 2023-11-16 | Discharge status: Home / Self Care | Payer: Managed Care, Other (non HMO) | Source: Ambulatory Visit | Attending: Radiation Oncology | Admitting: Radiation Oncology

## 2023-11-16 ENCOUNTER — Encounter: Payer: Self-pay | Admitting: *Deleted

## 2023-11-16 VITALS — BP 142/91 | HR 114 | Temp 96.6°F | Resp 14 | Ht 66.0 in | Wt 202.0 lb

## 2023-11-16 DIAGNOSIS — C50412 Malignant neoplasm of upper-outer quadrant of left female breast: Secondary | ICD-10-CM | POA: Insufficient documentation

## 2023-11-16 NOTE — Progress Notes (Signed)
Radiation Oncology Follow up Note  Name: Cristina Baker   Date:   11/16/2023 MRN:  [id number] DOB: 24-Apr-1986    This 37 y.o. 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 The patient had undergone neoadjuvant chemotherapy with TCHP for six cycles, which resulted in a complete response. This was followed by a sentinel node biopsy and wide local excision. Pathology showed a complete response with all three lymph nodes negative for malignancy. The patient is currently on Herceptin and Progetta maintenance..  She specifically [place] breast tenderness cough or bone pain.  COMPLICATIONS OF TREATMENT: none  FOLLOW UP COMPLIANCE: keeps appointments   PHYSICAL EXAM:  BP (!) 142/91   Pulse (!) 114   Temp (!) 96.6 F (35.9 C)   Resp 14   Ht 5\' 6"  (1.676 m)   Wt 202 lb (91.6 kg)   BMI 32.60 kg/m  Lungs are clear to A&P cardiac examination essentially unremarkable with regular rate and rhythm. No dominant mass or nodularity is noted in either breast in 2 positions examined. Incision is well-healed. No axillary or supraclavicular adenopathy is appreciated. Cosmetic result is excellent.  Well-developed well-nourished patient in NAD. HEENT reveals PERLA, EOMI, discs not visualized.  Oral cavity is clear. No oral mucosal lesions are identified. Neck is clear without evidence of cervical or supraclavicular adenopathy. Lungs are clear to A&P. Cardiac examination is essentially unremarkable with regular  rate and rhythm without murmur rub or thrill. Abdomen is benign with no organomegaly or masses noted. Motor sensory and DTR levels are equal and symmetric in the upper and lower extremities. Cranial nerves II through XII are grossly intact. Proprioception is intact. No peripheral adenopathy or edema is identified. No motor or sensory levels are noted. Crude visual fields are within normal range.  RADIOLOGY RESULTS: PATHOLOGY Wide local excision: Complete response with 0/3 lymph nodes, all negative for malignancy  PLAN: Stage IIB (T2N1M0) Breast Cancer, ER/PR negative, HER2 overexpressed Complete response to neoadjuvant chemotherapy (TCHP for 6 cycles) followed by sentinel node biopsy and wide local excision. Pathology showed complete response with 0/3 lymph nodes negative for malignancy. Currently on Herceptin and Perjeta maintenance. -Continue Herceptin and Perjeta maintenance. Will see her back in 6 months for follow-up. She is being followed by medical oncology with a 3 mm right middle lobe pulmonary nodule which is new compared to prior study    Carmina Miller, MD

## 2023-11-17 ENCOUNTER — Other Ambulatory Visit: Payer: Self-pay

## 2023-11-21 ENCOUNTER — Other Ambulatory Visit: Payer: Self-pay | Admitting: Internal Medicine

## 2023-11-21 DIAGNOSIS — F909 Attention-deficit hyperactivity disorder, unspecified type: Secondary | ICD-10-CM

## 2023-11-21 MED ORDER — AMPHETAMINE-DEXTROAMPHETAMINE 20 MG PO TABS: 20.0000 mg | ORAL_TABLET | Freq: Two times a day (BID) | ORAL | 0 refills | Status: DC

## 2023-11-22 ENCOUNTER — Inpatient Hospital Stay: Payer: Managed Care, Other (non HMO) | Attending: Internal Medicine

## 2023-11-22 VITALS — BP 126/77 | HR 85 | Temp 97.2°F | Resp 16 | Wt 201.7 lb

## 2023-11-22 DIAGNOSIS — Z79899 Other long term (current) drug therapy: Secondary | ICD-10-CM | POA: Diagnosis not present

## 2023-11-22 DIAGNOSIS — Z5112 Encounter for antineoplastic immunotherapy: Secondary | ICD-10-CM | POA: Insufficient documentation

## 2023-11-22 DIAGNOSIS — C50912 Malignant neoplasm of unspecified site of left female breast: Secondary | ICD-10-CM

## 2023-11-22 DIAGNOSIS — C50412 Malignant neoplasm of upper-outer quadrant of left female breast: Secondary | ICD-10-CM | POA: Insufficient documentation

## 2023-11-22 MED ORDER — DIPHENHYDRAMINE HCL 25 MG PO CAPS
50.0000 mg | ORAL_CAPSULE | Freq: Once | ORAL | Status: AC
  Administered 2023-11-22: 50 mg via ORAL
  Filled 2023-11-22: qty 2

## 2023-11-22 MED ORDER — ACETAMINOPHEN 325 MG PO TABS
650.0000 mg | ORAL_TABLET | Freq: Once | ORAL | Status: AC
  Administered 2023-11-22: 650 mg via ORAL
  Filled 2023-11-22: qty 2

## 2023-11-22 MED ORDER — TRASTUZUMAB-ANNS CHEMO 150 MG IV SOLR
6.0000 mg/kg | Freq: Once | INTRAVENOUS | Status: AC
  Administered 2023-11-22: 525 mg via INTRAVENOUS
  Filled 2023-11-22: qty 25

## 2023-11-22 MED ORDER — HEPARIN SOD (PORK) LOCK FLUSH 100 UNIT/ML IV SOLN
500.0000 [IU] | Freq: Once | INTRAVENOUS | Status: AC | PRN
  Administered 2023-11-22: 500 [IU]
  Filled 2023-11-22: qty 5

## 2023-11-22 MED ORDER — PERTUZUMAB CHEMO INJECTION 420 MG/14ML
420.0000 mg | Freq: Once | INTRAVENOUS | Status: AC
  Administered 2023-11-22: 420 mg via INTRAVENOUS
  Filled 2023-11-22: qty 14

## 2023-11-22 MED ORDER — SODIUM CHLORIDE 0.9 % IV SOLN
Freq: Once | INTRAVENOUS | Status: AC
Start: 2023-11-22 — End: 2023-11-22
  Filled 2023-11-22: qty 250

## 2023-11-22 NOTE — Patient Instructions (Signed)

## 2023-12-13 ENCOUNTER — Ambulatory Visit: Payer: Managed Care, Other (non HMO)

## 2023-12-13 ENCOUNTER — Inpatient Hospital Stay: Payer: Managed Care, Other (non HMO)

## 2023-12-13 ENCOUNTER — Encounter: Payer: Self-pay | Admitting: Internal Medicine

## 2023-12-13 ENCOUNTER — Inpatient Hospital Stay (HOSPITAL_BASED_OUTPATIENT_CLINIC_OR_DEPARTMENT_OTHER): Payer: Managed Care, Other (non HMO) | Admitting: Internal Medicine

## 2023-12-13 VITALS — BP 135/80 | HR 97 | Temp 97.3°F | Ht 66.0 in | Wt 203.8 lb

## 2023-12-13 VITALS — BP 126/79 | HR 84

## 2023-12-13 DIAGNOSIS — C50912 Malignant neoplasm of unspecified site of left female breast: Secondary | ICD-10-CM

## 2023-12-13 DIAGNOSIS — Z5112 Encounter for antineoplastic immunotherapy: Secondary | ICD-10-CM | POA: Diagnosis not present

## 2023-12-13 DIAGNOSIS — E876 Hypokalemia: Secondary | ICD-10-CM | POA: Diagnosis not present

## 2023-12-13 DIAGNOSIS — Z171 Estrogen receptor negative status [ER-]: Secondary | ICD-10-CM

## 2023-12-13 LAB — COMPREHENSIVE METABOLIC PANEL
ALT: 26 U/L (ref 0–44)
AST: 21 U/L (ref 15–41)
Albumin: 3.6 g/dL (ref 3.5–5.0)
Alkaline Phosphatase: 90 U/L (ref 38–126)
Anion gap: 10 (ref 5–15)
BUN: 19 mg/dL (ref 6–20)
CO2: 24 mmol/L (ref 22–32)
Calcium: 9.4 mg/dL (ref 8.9–10.3)
Chloride: 105 mmol/L (ref 98–111)
Creatinine, Ser: 0.65 mg/dL (ref 0.44–1.00)
GFR, Estimated: >60 mL/min (ref >60)
Glucose, Bld: 140 mg/dL — ABNORMAL HIGH (ref 70–99)
Potassium: 3.2 mmol/L — ABNORMAL LOW (ref 3.5–5.1)
Sodium: 139 mmol/L (ref 135–145)
Total Bilirubin: 0.7 mg/dL (ref 0.0–1.2)
Total Protein: 6.9 g/dL (ref 6.5–8.1)

## 2023-12-13 LAB — CBC WITH DIFFERENTIAL/PLATELET
Abs Immature Granulocytes: 0.05 10*3/uL (ref 0.00–0.07)
Basophils Absolute: 0 10*3/uL (ref 0.0–0.1)
Basophils Relative: 0 %
Eosinophils Absolute: 0.1 10*3/uL (ref 0.0–0.5)
Eosinophils Relative: 2 %
HCT: 35.5 % — ABNORMAL LOW (ref 36.0–46.0)
Hemoglobin: 11.5 g/dL — ABNORMAL LOW (ref 12.0–15.0)
Immature Granulocytes: 1 %
Lymphocytes Relative: 25 %
Lymphs Abs: 1.5 10*3/uL (ref 0.7–4.0)
MCH: 27.2 pg (ref 26.0–34.0)
MCHC: 32.4 g/dL (ref 30.0–36.0)
MCV: 83.9 fL (ref 80.0–100.0)
Monocytes Absolute: 0.4 10*3/uL (ref 0.1–1.0)
Monocytes Relative: 6 %
Neutro Abs: 3.8 10*3/uL (ref 1.7–7.7)
Neutrophils Relative %: 66 %
Platelets: 258 10*3/uL (ref 150–400)
RBC: 4.23 MIL/uL (ref 3.87–5.11)
RDW: 13.7 % (ref 11.5–15.5)
WBC: 5.8 10*3/uL (ref 4.0–10.5)
nRBC: 0 % (ref 0.0–0.2)

## 2023-12-13 MED ORDER — HEPARIN SOD (PORK) LOCK FLUSH 100 UNIT/ML IV SOLN
500.0000 [IU] | Freq: Once | INTRAVENOUS | Status: AC | PRN
  Administered 2023-12-13: 500 [IU]
  Filled 2023-12-13: qty 5

## 2023-12-13 MED ORDER — LIDOCAINE-PRILOCAINE 2.5-2.5 % EX CREA: 1.0000 | TOPICAL_CREAM | CUTANEOUS | 0 refills | Status: DC | PRN

## 2023-12-13 MED ORDER — ACETAMINOPHEN 325 MG PO TABS
650.0000 mg | ORAL_TABLET | Freq: Once | ORAL | Status: AC
  Administered 2023-12-13: 650 mg via ORAL
  Filled 2023-12-13: qty 2

## 2023-12-13 MED ORDER — DIPHENHYDRAMINE HCL 25 MG PO CAPS
50.0000 mg | ORAL_CAPSULE | Freq: Once | ORAL | Status: AC
  Administered 2023-12-13: 50 mg via ORAL
  Filled 2023-12-13: qty 2

## 2023-12-13 MED ORDER — SODIUM CHLORIDE 0.9 % IV SOLN
Freq: Once | INTRAVENOUS | Status: AC
  Filled 2023-12-13: qty 250

## 2023-12-13 MED ORDER — TRASTUZUMAB-ANNS CHEMO 150 MG IV SOLR
6.0000 mg/kg | Freq: Once | INTRAVENOUS | Status: AC
  Administered 2023-12-13: 525 mg via INTRAVENOUS
  Filled 2023-12-13: qty 25

## 2023-12-13 MED ORDER — SODIUM CHLORIDE 0.9 % IV SOLN
420.0000 mg | Freq: Once | INTRAVENOUS | Status: AC
  Administered 2023-12-13: 420 mg via INTRAVENOUS
  Filled 2023-12-13: qty 14

## 2023-12-13 NOTE — Patient Instructions (Signed)
 [facility]  Discharge Instructions: Thank you for choosing [facility] to provide your oncology and hematology care.  If you have a lab appointment with the Cancer Center, please go directly to the Cancer Center and check in at the registration area.  Wear comfortable clothing and clothing appropriate for easy access to any Portacath or PICC line.   We strive to give you quality time with your provider. You may need to reschedule your appointment if you arrive late (15 or more minutes).  Arriving late affects you and other patients whose appointments are after yours.  Also, if you miss three or more appointments without notifying the office, you may be dismissed from the clinic at the provider's discretion.      For prescription refill requests, have your pharmacy contact our office and allow 72 hours for refills to be completed.    Today you received the following chemotherapy and/or immunotherapy agents Kanjinti and Perjeta.      To help prevent nausea and vomiting after your treatment, we encourage you to take your nausea medication as directed.  BELOW ARE SYMPTOMS THAT SHOULD BE REPORTED IMMEDIATELY: *FEVER GREATER THAN 100.4 F (38 C) OR HIGHER *CHILLS OR SWEATING *NAUSEA AND VOMITING THAT IS NOT CONTROLLED WITH YOUR NAUSEA MEDICATION *UNUSUAL SHORTNESS OF BREATH *UNUSUAL BRUISING OR BLEEDING *URINARY PROBLEMS (pain or burning when urinating, or frequent urination) *BOWEL PROBLEMS (unusual diarrhea, constipation, pain near the anus) TENDERNESS IN MOUTH AND THROAT WITH OR WITHOUT PRESENCE OF ULCERS (sore throat, sores in mouth, or a toothache) UNUSUAL RASH, SWELLING OR PAIN  UNUSUAL VAGINAL DISCHARGE OR ITCHING   Items with * indicate a potential emergency and should be followed up as soon as possible or go to the Emergency Department if any problems should occur.  Please show the CHEMOTHERAPY ALERT CARD or  IMMUNOTHERAPY ALERT CARD at check-in to the Emergency Department and triage nurse.  Should you have questions after your visit or need to cancel or reschedule your appointment, please contact [facility] - A DEPT OF [facility]  [phone] and follow the prompts.  Office hours are 8:00 a.m. to 4:30 p.m. Monday - Friday. Please note that voicemails left after 4:00 p.m. may not be returned until the following business day.  We are closed weekends and major holidays. You have access to a nurse at all times for urgent questions. Please call the main number to the clinic [phone] and follow the prompts.  For any non-urgent questions, you may also contact your provider using MyChart. We now offer e-Visits for anyone 69 and older to request care online for non-urgent symptoms. For details visit [email or web address].   Also download the MyChart app! Go to the app store, search "MyChart", open the app, select [place], and log in with your MyChart username and password.

## 2023-12-13 NOTE — Progress Notes (Signed)
Needs rx for emla sent in, pended.  C/o being tired all the time.  Had CT for kidney stone 11/2023 at [facility]

## 2023-12-13 NOTE — Progress Notes (Signed)
 [facility] CONSULT NOTE  Patient Care Team: Albina GORMAN Dine, MD as PCP - General (Internal Medicine) Georgina Shasta POUR, RN as Oncology Nurse Navigator Clista Bimler, MD as Consulting Physician (Oncology) Lenn Aran, MD as Consulting Physician (Radiation Oncology)  CANCER STAGING   Cancer Staging  Breast cancer [facility]) Staging form: Breast, AJCC 8th Edition - Clinical: Stage IIB (cT2, cN1, cM0, G3, ER-, PR-, HER2+) - Signed by Alva Broxson, MD on 02/17/2023 Histologic grading system: 3 grade system   ASSESSMENT & PLAN:  Cristina Baker 37 y.o. 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  # Left breast invasive ductal cancer, ER/PR negative, HER2 positive, stage IIb -Self detected left breast mass.  Status post biopsy on 02/10/2023.  - Completed neoadjuvant TCHP x 6 cycle.    - s/p left breast lumpectomy with SLNB with Dr. Tye on 07/28/2023.  Pathology showed path CR.  0/3 lymph nodes negative for malignancy. -Completed adjuvant radiation from 08/29/2023 to 10/13/2023. -Has been on maintenance Herceptin  and Perjeta .  Cycle 13.  Tolerating well.  We are getting labs with alternate cycles.  Labs today reviewed and acceptable for treatment. -Echo reviewed from November 2024 which showed EF of 55 to 60%.  Will repeat another echo in 3 months. -Will schedule for bilateral diagnostic mammogram on February 11, 2024.  # Hypokalemia -K3.2.  Advised to take K-Dur 20 milliequivalents once daily for 7 days.  Reports she has pills at home.  # Right ninth rib lesion -Questionable.  Last CT in May 2024 showed stable 7 mm lesion.  Case was previously discussed with Dr. Polly from IR and lesion was too small to biopsy.  Repeat CT scan showed stable lesion likely bony island.  -CT chest from 11/13/2023 showed small 3 mm right middle lobe nodule.   Likely benign but surveillance recommended with her history of breast cancer.  Will repeat CT in about 3 to 4 months.  # Chemotherapy-induced neuropathy -Managed without medications  # Family history of breast cancer -Genetic testing was negative  # Anxiety -Xanax 0.25 mg twice daily as needed  # History of palpitations  -Reportedly, she had workup with cardiology which was negative.  It was deemed secondary to panic. -On metoprolol .  # Access-port placed by Dr. Tye.  Has Emla  cream  Orders Placed This Encounter  Procedures   MM DIAG BREAST TOMO BILATERAL    Standing Status:   Future    Expected Date:   02/11/2024    Expiration Date:   12/12/2024    Reason for Exam (SYMPTOM  OR DIAGNOSIS REQUIRED):   breast cancer    Is the patient pregnant?:   No    Preferred imaging location?:   [facility]   RTC in 3 weeks for Herceptin  Perjeta  RTC in 6 weeks for MD visit, labs, Herceptin  Perjeta .  The total time spent in the appointment was 30 minutes encounter with patients including review of chart and various tests results, discussions about plan of care and coordination of care plan   All questions were answered. The patient knows to call the clinic with any problems, questions or concerns. No barriers to learning was detected.  Bimler Clista, MD 12/31/202412:41 PM   HISTORY OF PRESENTING ILLNESS:  Cristina Baker 37 y.o. 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  She denies any pain or tenderness in the breast.  Denies any discharge.  There is family history of breast cancer in paternal grandmother and 2 aunts.  She had a BRCA testing about 15 years ago which was negative.  She has completed family-planning.  Underwent tubal ligation.  She has 3 children 1 in 61s second in his teen and the youngest is 39 years old.    Interval  history Patient was seen today as follow-up to start her maintenance Herceptin  and Perjeta . Patient continues to feel tired despite discontinuation of chemotherapy.  She is recovering from cold.  Otherwise denies any shortness of breath.  Was recently in the ED with right flank pain was found to have the kidney stones which she passed the next day and pain has improved since.  I have reviewed her chart and materials related to her cancer extensively and collaborated history with the patient. Summary of oncologic history is as follows: Oncology History  Breast cancer (HCC)  01/26/2023 Initial Diagnosis   Patient felt palpable lump in left breast   02/10/2023 Mammogram     IMPRESSION: 1. There is a suspicious 24 mm mass at the site of palpable concern which is concerning for malignancy. It demonstrates 3 cm of associated calcifications. Recommend ultrasound-guided biopsy for definitive characterization with attention on post marker placement mammogram to assess for extent of calcifications in relation to the biopsy marking clip. 2. There is an indeterminate LEFT axillary lymph node. Recommend ultrasound-guided biopsy for definitive characterization. 3. No mammographic evidence of malignancy in the RIGHT breast.   02/10/2023 Pathology Results   DIAGNOSIS: A.  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  Size of invasive carcinoma: 1.7 cm in linear length in this sample Histologic grade of invasive carcinoma: Grade 3 Glandular/tubular differentiation score: 3 Nuclear pleomorphism score: 3 Mitotic rate score: 2 Total score: 8 Ductal carcinoma in situ: Present, high-grade Lymphovascular invasion: Indeterminate See comment.  B.  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   Estrogen Receptor (ER) Status: NEGATIVE (LESS THAN 1%)         Internal control cells  positive Progesterone Receptor (PgR) Status: Negative HER2 (by immunohistochemistry): Positive Ki-67: Not performed    02/17/2023 Cancer Staging   Staging form: Breast, AJCC 8th Edition - Clinical: Stage IIB (cT2, cN1, cM0, G3, ER-, PR-, HER2+) - Signed by Barbera Perritt, MD on 02/17/2023 Histologic grading system: 3 grade system   03/07/2023 - 06/21/2023 Chemotherapy   Patient is on Treatment Plan : BREAST  Docetaxel  + Carboplatin  + Trastuzumab  + Pertuzumab   (TCHP) q21d       Genetic Testing   Negative genetic testing. No pathogenic variants identified on the [facility] Multi-Cancer+RNA panel. The report date is 03/01/2023.  The Multi-Cancer + RNA Panel offered by [facility] includes sequencing and/or deletion/duplication analysis of the following 70 genes:  AIP*, ALK, APC*, ATM*, AXIN2*, BAP1*, BARD1*, BLM*, BMPR1A*, BRCA1*, BRCA2*, BRIP1*, CDC73*, CDH1*, CDK4, CDKN1B*, CDKN2A, CHEK2*, CTNNA1*, DICER1*, EPCAM, EGFR, FH*, FLCN*, GREM1, HOXB13, KIT, LZTR1, MAX*, MBD4, MEN1*, MET, MITF, MLH1*, MSH2*, MSH3*, MSH6*, MUTYH*, NF1*, NF2*, NTHL1*, PALB2*, PDGFRA, PMS2*, POLD1*, POLE*, POT1*, PRKAR1A*, PTCH1*, PTEN*, RAD51C*, RAD51D*, RB1*, RET, SDHA*, SDHAF2*, SDHB*, SDHC*, SDHD*, SMAD4*, SMARCA4*, SMARCB1*, SMARCE1*, STK11*, SUFU*, TMEM127*, TP53*, TSC1*, TSC2*, VHL*. RNA analysis is performed for * genes.   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    Family history of breast cancer 2015   IBIS=18.8%    Family history of ovarian cancer    Heart palpitations    Herpes genitalis    Hypomagnesemia    Leg swelling    Leukocytosis    Lichen sclerosus    Mixed hyperlipidemia    PAC (premature atrial contraction)    PONV (postoperative nausea and vomiting)    nausea   PVC's (premature ventricular contractions)    Tachycardia, unspecified     SURGICAL HISTORY: Past Surgical History:  Procedure Laterality Date   BREAST BIOPSY Left 02/10/2023   invasive mammary carcinoma   BREAST BIOPSY Left 02/10/2023   Us  Core Hydromark clip path pending   BREAST BIOPSY Left 02/10/2023   US  LT BREAST BX W LOC DEV 1ST LESION IMG BX SPEC US  GUIDE 02/10/2023 [facility]   BREAST BIOPSY Left 06/30/2023   US  LT RADIO FREQUENCY TAG EA ADD LESION LOC US  GUIDE 06/30/2023 [facility]   BREAST LUMPECTOMY,RADIO FREQ LOCALIZER,AXILLARY SENTINEL LYMPH NODE BIOPSY Left 07/28/2023   Procedure: BREAST LUMPECTOMY,RADIO FREQ LOCALIZER,AXILLARY SENTINEL LYMPH NODE BIOPSY;  Surgeon: Tye Millet, DO;  Location: [facility];  Service: General;  Laterality: Left;   CESAREAN SECTION  01/03/2007   CESAREAN SECTION WITH BILATERAL TUBAL LIGATION N/A 03/26/2016   Procedure: CESAREAN SECTION WITH BILATERAL TUBAL LIGATION;  Surgeon: Garnette JONETTA Mace, MD;  Location: [facility];  Service: Obstetrics;  Laterality: N/A;   COLPOSCOPY     IUD REMOVAL     PORTACATH PLACEMENT N/A 02/24/2023   Procedure: INSERTION PORT-A-CATH;  Surgeon: Tye Millet, DO;  Location: [facility];  Service: General;  Laterality: N/A;    SOCIAL HISTORY: Social History   Socioeconomic History   Marital status: Married    Spouse name: Josefa   Number of children: 3   Years of education: Not on file   Highest education level: Not on file  Occupational History   Not on file  Tobacco Use   Smoking status: Former    Current packs/day: 0.00    Types: Cigarettes    Quit date: 2022    Years since quitting: 3.0   Smokeless tobacco: Never  Vaping Use    Vaping status: Never Used  Substance and Sexual Activity   Alcohol use: Yes    Comment: rarely   Drug use: No   Sexual activity: Yes    Birth control/protection: Surgical    Comment: tubal ligation  Other Topics Concern   Not on file  Social History Narrative   Not on file   Social Drivers of Health   Financial Resource Strain: Low Risk  (04/05/2023)   Overall Financial Resource Strain (CARDIA)    Difficulty of Paying Living Expenses: Not very hard  Food Insecurity: No Food Insecurity (04/05/2023)   Hunger Vital Sign    Worried About Running Out of Food in the Last  Year: Never true    Ran Out of Food in the Last Year: Never true  Transportation Needs: No Transportation Needs (02/17/2023)   PRAPARE - Administrator, Civil Service (Medical): No    Lack of Transportation (Non-Medical): No  Physical Activity: Inactive (04/05/2023)   Exercise Vital Sign    Days of Exercise per Week: 0 days    Minutes of Exercise per Session: 0 min  Stress: Stress Concern Present (04/05/2023)   [employer] of Occupational Health - Occupational Stress Questionnaire    Feeling of Stress : To some extent  Social Connections: Socially Integrated (04/05/2023)   Social Connection and Isolation Panel [NHANES]    Frequency of Communication with Friends and Family: Three times a week    Frequency of Social Gatherings with Friends and Family: Once a week    Attends Religious Services: 1 to 4 times per year    Active Member of [employer] or Organizations: Yes    Attends Banker Meetings: 1 to 4 times per year    Marital Status: Married  Catering Manager Violence: Not At Risk (02/17/2023)   Humiliation, Afraid, Rape, and Kick questionnaire    Fear of Current or Ex-Partner: No    Emotionally Abused: No    Physically Abused: No    Sexually Abused: No    FAMILY HISTORY: Family History  Problem Relation Age of Onset   Hypercholesterolemia Mother    Diabetes Mellitus II Father     Hypertension Father    Breast cancer Paternal Aunt 36   Breast cancer Paternal Aunt 2   Breast cancer Paternal Grandmother        85s   Throat cancer Paternal Grandfather    Heart attack Paternal Grandfather     ALLERGIES:  has no known allergies.  MEDICATIONS:  Current Outpatient Medications  Medication Sig Dispense Refill   ALPRAZolam (XANAX) 0.5 MG tablet USE AS DIRECTED TAKE HALF TAB TWICE DAILY FOR ANXIETY 15 tablet 0   amphetamine -dextroamphetamine  (ADDERALL) 20 MG tablet Take 1 tablet (20 mg total) by mouth 2 (two) times daily. 60 tablet 0   aspirin-acetaminophen -caffeine (EXCEDRIN MIGRAINE) 250-250-65 MG tablet Take 1 tablet by mouth every 6 (six) hours as needed for headache.     clobetasol  ointment (TEMOVATE ) 0.05 % Apply to affected area once wkly as maintenance 45 g 0   escitalopram  (LEXAPRO ) 10 MG tablet Take 1 tablet (10 mg total) by mouth daily. 90 tablet 1   lidocaine -prilocaine  (EMLA ) cream Apply 1 Application topically as needed. 30 g 0   metoprolol  succinate (TOPROL -XL) 25 MG 24 hr tablet TAKE 1 TABLET BY MOUTH ONCE A DAY **MAKE FOLLOW UP APPT WITH DR. KHAN FOR REFILLS** 90 tablet 0   ondansetron  (ZOFRAN ) 4 MG tablet Take 1 tablet (4 mg total) by mouth every 8 (eight) hours as needed. 10 tablet 0   oxyCODONE  (ROXICODONE ) 5 MG immediate release tablet Take 1 tablet (5 mg total) by mouth every 8 (eight) hours as needed. 6 tablet 0   silver  sulfADIAZINE  (SILVADENE ) 1 % cream Apply 1 Application topically 2 (two) times daily. Apply to affected area as directed. 50 g 1   potassium chloride  SA (KLOR-CON  M) 20 MEQ tablet Take 1 tablet (20 mEq total) by mouth daily for 7 days. 7 tablet 0   No current facility-administered medications for this visit.   Facility-Administered Medications Ordered in Other Visits  Medication Dose Route Frequency Provider Last Rate Last Admin   heparin  lock flush 100  unit/mL  500 Units Intravenous Once Langdon Crosson, MD       heparin  lock flush  100 unit/mL  500 Units Intravenous Once Marin Milley, MD       heparin  lock flush 100 unit/mL  500 Units Intracatheter Once PRN Ben Sanz, MD       heparin  lock flush 100 unit/mL  500 Units Intracatheter Once PRN Brigett Estell, MD       pertuzumab  (PERJETA ) 420 mg in sodium chloride  0.9 % 250 mL chemo infusion  420 mg Intravenous Once Tayjah Lobdell, MD       sodium chloride  flush (NS) 0.9 % injection 10 mL  10 mL Intravenous PRN Caterin Tabares, MD   10 mL at 11/01/23 1021   trastuzumab -anns (KANJINTI ) 525 mg in sodium chloride  0.9 % 250 mL chemo infusion  6 mg/kg (Treatment Plan Recorded) Intravenous Once Tylin Force, MD 550 mL/hr at 12/13/23 1219 525 mg at 12/13/23 1219    REVIEW OF SYSTEMS:   Pertinent information mentioned in HPI All other systems were reviewed with the patient and are negative.  PHYSICAL EXAMINATION: ECOG PERFORMANCE STATUS: 0 - Asymptomatic  Vitals:   12/13/23 1048  BP: 135/80  Pulse: 97  Temp: (!) 97.3 F (36.3 C)  SpO2: 97%    Filed Weights   12/13/23 1048  Weight: 203 lb 12.8 oz (92.4 kg)     GENERAL:alert, no distress and comfortable SKIN: skin color, texture, turgor are normal, no rashes or significant lesions EYES: normal, conjunctiva are pink and non-injected, sclera clear OROPHARYNX:no exudate, no erythema and lips, buccal mucosa, and tongue normal  NECK: supple, thyroid normal size, non-tender, without nodularity LYMPH:  no palpable lymphadenopathy in the cervical, axillary or inguinal LUNGS: clear to auscultation and percussion with normal breathing effort HEART: regular rate & rhythm and no murmurs and no lower extremity edema ABDOMEN:abdomen soft, non-tender and normal bowel sounds Musculoskeletal:no cyanosis of digits and no clubbing  PSYCH: alert & oriented x 3 with fluent speech NEURO: no focal motor/sensory deficits  LABORATORY DATA:  I have reviewed the data as listed Lab Results  Component Value Date   WBC 5.8  12/13/2023   HGB 11.5 (L) 12/13/2023   HCT 35.5 (L) 12/13/2023   MCV 83.9 12/13/2023   PLT 258 12/13/2023   Recent Labs    11/01/23 1018 11/13/23 0749 12/13/23 1029  NA 141 140 139  K 3.3* 3.4* 3.2*  CL 106 106 105  CO2 25 24 24   GLUCOSE 100* 145* 140*  BUN 19 28* 19  CREATININE 0.71 0.98 0.65  CALCIUM 9.5 9.3 9.4  GFRNONAA >60 >60 >60  PROT 7.0 7.1 6.9  ALBUMIN 3.7 3.7 3.6  AST 21 18 21   ALT 26 25 26   ALKPHOS 85 88 90  BILITOT 0.6 0.5 0.7    RADIOGRAPHIC STUDIES: I have personally reviewed the radiological images as listed and agreed with the findings in the report. No results found.

## 2023-12-21 ENCOUNTER — Other Ambulatory Visit: Payer: Self-pay | Admitting: Internal Medicine

## 2023-12-21 DIAGNOSIS — F909 Attention-deficit hyperactivity disorder, unspecified type: Secondary | ICD-10-CM

## 2023-12-23 MED ORDER — AMPHETAMINE-DEXTROAMPHETAMINE 20 MG PO TABS: 20.0000 mg | ORAL_TABLET | Freq: Two times a day (BID) | ORAL | 0 refills | Status: DC

## 2023-12-28 ENCOUNTER — Encounter: Payer: Self-pay | Admitting: Internal Medicine

## 2024-01-03 ENCOUNTER — Inpatient Hospital Stay: Payer: Managed Care, Other (non HMO) | Attending: Internal Medicine

## 2024-01-03 VITALS — BP 135/75 | HR 77 | Temp 96.2°F | Resp 16 | Wt 202.0 lb

## 2024-01-03 DIAGNOSIS — Z79899 Other long term (current) drug therapy: Secondary | ICD-10-CM | POA: Diagnosis not present

## 2024-01-03 DIAGNOSIS — Z5112 Encounter for antineoplastic immunotherapy: Secondary | ICD-10-CM | POA: Diagnosis present

## 2024-01-03 DIAGNOSIS — C50412 Malignant neoplasm of upper-outer quadrant of left female breast: Secondary | ICD-10-CM | POA: Diagnosis present

## 2024-01-03 DIAGNOSIS — C50912 Malignant neoplasm of unspecified site of left female breast: Secondary | ICD-10-CM

## 2024-01-03 MED ORDER — DIPHENHYDRAMINE HCL 25 MG PO CAPS
50.0000 mg | ORAL_CAPSULE | Freq: Once | ORAL | Status: AC
  Administered 2024-01-03: 50 mg via ORAL
  Filled 2024-01-03: qty 2

## 2024-01-03 MED ORDER — TRASTUZUMAB-ANNS CHEMO 420 MG IV SOLR
6.0000 mg/kg | Freq: Once | INTRAVENOUS | Status: AC
  Administered 2024-01-03: 525 mg via INTRAVENOUS
  Filled 2024-01-03: qty 25

## 2024-01-03 MED ORDER — ACETAMINOPHEN 325 MG PO TABS
650.0000 mg | ORAL_TABLET | Freq: Once | ORAL | Status: AC
Start: 2024-01-03 — End: 2024-01-03
  Administered 2024-01-03: 650 mg via ORAL
  Filled 2024-01-03: qty 2

## 2024-01-03 MED ORDER — SODIUM CHLORIDE 0.9% FLUSH
10.0000 mL | INTRAVENOUS | Status: DC | PRN
Start: 2024-01-03 — End: 2024-01-03
  Filled 2024-01-03: qty 10

## 2024-01-03 MED ORDER — SODIUM CHLORIDE 0.9 % IV SOLN
420.0000 mg | Freq: Once | INTRAVENOUS | Status: AC
  Administered 2024-01-03: 420 mg via INTRAVENOUS
  Filled 2024-01-03: qty 14

## 2024-01-03 MED ORDER — SODIUM CHLORIDE 0.9 % IV SOLN
Freq: Once | INTRAVENOUS | Status: AC
  Filled 2024-01-03: qty 250

## 2024-01-03 MED ORDER — HEPARIN SOD (PORK) LOCK FLUSH 100 UNIT/ML IV SOLN
500.0000 [IU] | Freq: Once | INTRAVENOUS | Status: AC | PRN
Start: 2024-01-03 — End: 2024-01-03
  Administered 2024-01-03: 500 [IU]
  Filled 2024-01-03: qty 5

## 2024-01-03 NOTE — Patient Instructions (Signed)
[facility] - A DEPT OF [facility]  Discharge Instructions: Thank you for choosing [facility] to provide your oncology and hematology care.  If you have a lab appointment with the Cancer Center, please go directly to the Cancer Center and check in at the registration area.  Wear comfortable clothing and clothing appropriate for easy access to any Portacath or PICC line.   We strive to give you quality time with your provider. You may need to reschedule your appointment if you arrive late (15 or more minutes).  Arriving late affects you and other patients whose appointments are after yours.  Also, if you miss three or more appointments without notifying the office, you may be dismissed from the clinic at the provider's discretion.      For prescription refill requests, have your pharmacy contact our office and allow 72 hours for refills to be completed.    Today you received the following chemotherapy and/or immunotherapy agents- trastuzumab, pertuzumab      To help prevent nausea and vomiting after your treatment, we encourage you to take your nausea medication as directed.  BELOW ARE SYMPTOMS THAT SHOULD BE REPORTED IMMEDIATELY: *FEVER GREATER THAN 100.4 F (38 C) OR HIGHER *CHILLS OR SWEATING *NAUSEA AND VOMITING THAT IS NOT CONTROLLED WITH YOUR NAUSEA MEDICATION *UNUSUAL SHORTNESS OF BREATH *UNUSUAL BRUISING OR BLEEDING *URINARY PROBLEMS (pain or burning when urinating, or frequent urination) *BOWEL PROBLEMS (unusual diarrhea, constipation, pain near the anus) TENDERNESS IN MOUTH AND THROAT WITH OR WITHOUT PRESENCE OF ULCERS (sore throat, sores in mouth, or a toothache) UNUSUAL RASH, SWELLING OR PAIN  UNUSUAL VAGINAL DISCHARGE OR ITCHING   Items with * indicate a potential emergency and should be followed up as soon as possible or go to the Emergency Department if any problems should occur.  Please show the CHEMOTHERAPY ALERT CARD or  IMMUNOTHERAPY ALERT CARD at check-in to the Emergency Department and triage nurse.  Should you have questions after your visit or need to cancel or reschedule your appointment, please contact [facility] - A DEPT OF [facility]  [phone] and follow the prompts.  Office hours are 8:00 a.m. to 4:30 p.m. Monday - Friday. Please note that voicemails left after 4:00 p.m. may not be returned until the following business day.  We are closed weekends and major holidays. You have access to a nurse at all times for urgent questions. Please call the main number to the clinic [phone] and follow the prompts.  For any non-urgent questions, you may also contact your provider using MyChart. We now offer e-Visits for anyone 26 and older to request care online for non-urgent symptoms. For details visit [email or web address].   Also download the MyChart app! Go to the app store, search "MyChart", open the app, select [place], and log in with your MyChart username and password.

## 2024-01-05 ENCOUNTER — Encounter: Payer: Self-pay | Admitting: Internal Medicine

## 2024-01-06 ENCOUNTER — Encounter: Payer: Self-pay | Admitting: Nurse Practitioner

## 2024-01-06 ENCOUNTER — Inpatient Hospital Stay (HOSPITAL_BASED_OUTPATIENT_CLINIC_OR_DEPARTMENT_OTHER): Payer: Managed Care, Other (non HMO) | Admitting: Nurse Practitioner

## 2024-01-06 ENCOUNTER — Ambulatory Visit
Admission: RE | Admit: 2024-01-06 | Discharge: 2024-01-06 | Disposition: A | Discharge status: Home / Self Care | Payer: Managed Care, Other (non HMO) | Source: Ambulatory Visit | Attending: Nurse Practitioner | Admitting: Nurse Practitioner

## 2024-01-06 VITALS — BP 128/96 | HR 98 | Resp 19 | Wt 207.3 lb

## 2024-01-06 DIAGNOSIS — Z5112 Encounter for antineoplastic immunotherapy: Secondary | ICD-10-CM | POA: Diagnosis not present

## 2024-01-06 DIAGNOSIS — M79601 Pain in right arm: Secondary | ICD-10-CM

## 2024-01-06 MED ORDER — PREDNISONE 10 MG (21) PO TBPK: ORAL_TABLET | ORAL | 0 refills | Status: DC

## 2024-01-06 MED ORDER — DICLOFENAC SODIUM 1 % EX GEL: 2.0000 g | Freq: Two times a day (BID) | CUTANEOUS | 0 refills | Status: DC | PRN

## 2024-01-06 NOTE — Progress Notes (Signed)
Symptom Management Clinic  [facility] A Department of the [place]. [facility] [address] [place], [place] [address] [phone] (phone) [phone] (fax)  Patient Care Team: Sherron Monday, MD as PCP - General (Internal Medicine) Hulen Luster, RN as Oncology Nurse Navigator Michaelyn Barter, MD as Consulting Physician (Oncology) Carmina Miller, MD as Consulting Physician (Radiation Oncology)   Name of the patient: Cristina Baker  [id number]  06/23/1986   Date of visit: 01/06/24  Diagnosis- Breast Cancer  Chief complaint/ Reason for visit- Right arm pain  Heme/Onc history:  Oncology History  Breast cancer (HCC)  01/26/2023 Initial Diagnosis   Patient felt palpable lump in left breast   02/10/2023 Mammogram     IMPRESSION: 1. There is a suspicious 24 mm mass at the site of palpable concern which is concerning for malignancy. It demonstrates 3 cm of associated calcifications. Recommend ultrasound-guided biopsy for definitive characterization with attention on post marker placement mammogram to assess for extent of calcifications in relation to the biopsy marking clip. 2. There is an indeterminate LEFT axillary lymph node. Recommend ultrasound-guided biopsy for definitive characterization. 3. No mammographic evidence of malignancy in the RIGHT breast.   02/10/2023 Pathology Results   DIAGNOSIS: A.  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  Size of invasive carcinoma: 1.7 cm in linear length in this sample Histologic grade of invasive carcinoma: Grade 3 Glandular/tubular differentiation score: 3 Nuclear pleomorphism score: 3 Mitotic rate score: 2 Total score: 8 Ductal carcinoma in situ: Present, high-grade Lymphovascular invasion: Indeterminate See comment.  B.  LYMPH NODE, LEFT AXILLA; STEREOTACTIC CORE BIOPSY: - INVOLVED BY INVASIVE  MAMMARY CARCINOMA, NO SPECIAL TYPE.   Estrogen Receptor (ER) Status: NEGATIVE (LESS THAN 1%)         Internal control cells positive Progesterone Receptor (PgR) Status: Negative HER2 (by immunohistochemistry): Positive Ki-67: Not performed    02/17/2023 Cancer Staging   Staging form: Breast, AJCC 8th Edition - Clinical: Stage IIB (cT2, cN1, cM0, G3, ER-, PR-, HER2+) - Signed by Michaelyn Barter, MD on 02/17/2023 Histologic grading system: 3 grade system   03/07/2023 - 06/21/2023 Chemotherapy   Patient is on Treatment Plan : BREAST  Docetaxel + Carboplatin + Trastuzumab + Pertuzumab  (TCHP) q21d       Genetic Testing   Negative genetic testing. No pathogenic variants identified on the [facility] Multi-Cancer+RNA panel. The report date is 03/01/2023.  The Multi-Cancer + RNA Panel offered by [facility] includes sequencing and/or deletion/duplication analysis of the following 70 genes:  AIP*, ALK, APC*, ATM*, AXIN2*, BAP1*, BARD1*, BLM*, BMPR1A*, BRCA1*, BRCA2*, BRIP1*, CDC73*, CDH1*, CDK4, CDKN1B*, CDKN2A, CHEK2*, CTNNA1*, DICER1*, EPCAM, EGFR, FH*, FLCN*, GREM1, HOXB13, KIT, LZTR1, MAX*, MBD4, MEN1*, MET, MITF, MLH1*, MSH2*, MSH3*, MSH6*, MUTYH*, NF1*, NF2*, NTHL1*, PALB2*, PDGFRA, PMS2*, POLD1*, POLE*, POT1*, PRKAR1A*, PTCH1*, PTEN*, RAD51C*, RAD51D*, RB1*, RET, SDHA*, SDHAF2*, SDHB*, SDHC*, SDHD*, SMAD4*, SMARCA4*, SMARCB1*, SMARCE1*, STK11*, SUFU*, TMEM127*, TP53*, TSC1*, TSC2*, VHL*. RNA analysis is performed for * genes.   08/30/2023 -  Chemotherapy   Patient is on Treatment Plan : BREAST Trastuzumab  + Pertuzumab q21d x 13 cycles       Interval history- Patient is 38 year old female, currently on herceptin + perjeta for history of breast cancer, who presents to clinic for complaints of right elbow and arm pain. Pain began in December but has persisted since that time. She initially felt like pain was due  to over exertion but it hasn't resolved. Pain radiates up to her shoulder and she feels arm  weakness due to pain. She was exerting herself more than usual around time of pain. Denies trauma or falls. Denies swelling. No skin changes.   ECOG FS:1 - Symptomatic but completely ambulatory  Review of systems- Review of Systems  Constitutional:  Negative for chills, fever and malaise/fatigue.  Musculoskeletal:  Positive for joint pain.  Skin:  Negative for itching and rash.     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    Family history of breast cancer 2015   IBIS=18.8%   Family history of ovarian cancer    Heart palpitations    Herpes genitalis    Hypomagnesemia    Leg swelling    Leukocytosis    Lichen sclerosus    Mixed hyperlipidemia    PAC (premature atrial contraction)    PONV (postoperative nausea and vomiting)    nausea   PVC's (premature ventricular contractions)    Tachycardia, unspecified     Past Surgical History:  Procedure Laterality Date   BREAST BIOPSY Left 02/10/2023   invasive mammary carcinoma   BREAST BIOPSY Left 02/10/2023   [place] Core Hydromark clip path pending   BREAST BIOPSY Left 02/10/2023   [place] LT BREAST BX W LOC DEV 1ST LESION IMG BX SPEC US GUIDE 02/10/2023 [facility]   BREAST BIOPSY Left 06/30/2023   [place] LT RADIO FREQUENCY TAG EA ADD LESION LOC US GUIDE 06/30/2023 [facility]   BREAST LUMPECTOMY,RADIO FREQ LOCALIZER,AXILLARY SENTINEL LYMPH NODE BIOPSY Left 07/28/2023   Procedure: BREAST LUMPECTOMY,RADIO FREQ LOCALIZER,AXILLARY SENTINEL LYMPH NODE BIOPSY;  Surgeon: Sung Amabile, DO;  Location: [facility];  Service: General;  Laterality: Left;    CESAREAN SECTION  01/03/2007   CESAREAN SECTION WITH BILATERAL TUBAL LIGATION N/A 03/26/2016   Procedure: CESAREAN SECTION WITH BILATERAL TUBAL LIGATION;  Surgeon: Conard Novak, MD;  Location: [facility];  Service: Obstetrics;  Laterality: N/A;   COLPOSCOPY     IUD REMOVAL     PORTACATH PLACEMENT N/A 02/24/2023   Procedure: INSERTION PORT-A-CATH;  Surgeon: Sung Amabile, DO;  Location: [facility];  Service: General;  Laterality: N/A;    Social History   Socioeconomic History   Marital status: Married    Spouse name: Verdon Cummins   Number of children: 3   Years of education: Not on file   Highest education level: Not on file  Occupational History   Not on file  Tobacco Use   Smoking status: Former    Current packs/day: 0.00    Types: Cigarettes    Quit date: 2022    Years since quitting: 3.0   Smokeless tobacco: Never  Vaping Use   Vaping status: Never Used  Substance and Sexual Activity   Alcohol use: Yes    Comment: rarely   Drug use: No   Sexual activity: Yes    Birth control/protection: Surgical    Comment: tubal ligation  Other Topics Concern   Not on file  Social History Narrative  Not on file   Social Drivers of Health   Financial Resource Strain: Low Risk  (04/05/2023)   Overall Financial Resource Strain (CARDIA)    Difficulty of Paying Living Expenses: Not very hard  Food Insecurity: No Food Insecurity (04/05/2023)   Hunger Vital Sign    Worried About Running Out of Food in the Last Year: Never true    Ran Out of Food in the Last Year: Never true  Transportation Needs: No Transportation Needs (02/17/2023)   PRAPARE - Administrator, Civil Service (Medical): No    Lack of Transportation (Non-Medical): No  Physical Activity: Inactive (04/05/2023)   Exercise Vital Sign    Days of Exercise per Week: 0 days    Minutes of Exercise per Session: 0 min  Stress: Stress Concern Present (04/05/2023)   [employer] of Occupational Health - Occupational Stress  Questionnaire    Feeling of Stress : To some extent  Social Connections: Socially Integrated (04/05/2023)   Social Connection and Isolation Panel [NHANES]    Frequency of Communication with Friends and Family: Three times a week    Frequency of Social Gatherings with Friends and Family: Once a week    Attends Religious Services: 1 to 4 times per year    Active Member of [employer] or Organizations: Yes    Attends Banker Meetings: 1 to 4 times per year    Marital Status: Married  Catering manager Violence: Not At Risk (02/17/2023)   Humiliation, Afraid, Rape, and Kick questionnaire    Fear of Current or Ex-Partner: No    Emotionally Abused: No    Physically Abused: No    Sexually Abused: No    Family History  Problem Relation Age of Onset   Hypercholesterolemia Mother    Diabetes Mellitus II Father    Hypertension Father    Breast cancer Paternal Aunt 68   Breast cancer Paternal Aunt 29   Breast cancer Paternal Grandmother        60s   Throat cancer Paternal Grandfather    Heart attack Paternal Grandfather      Current Outpatient Medications:    ALPRAZolam (XANAX) 0.5 MG tablet, USE AS DIRECTED TAKE HALF TAB TWICE DAILY FOR ANXIETY, Disp: 15 tablet, Rfl: 0   amphetamine-dextroamphetamine (ADDERALL) 20 MG tablet, Take 1 tablet (20 mg total) by mouth 2 (two) times daily., Disp: 60 tablet, Rfl: 0   aspirin-acetaminophen-caffeine (EXCEDRIN MIGRAINE) 250-250-65 MG tablet, Take 1 tablet by mouth every 6 (six) hours as needed for headache., Disp: , Rfl:    clobetasol ointment (TEMOVATE) 0.05 %, Apply to affected area once wkly as maintenance, Disp: 45 g, Rfl: 0   escitalopram (LEXAPRO) 10 MG tablet, Take 1 tablet (10 mg total) by mouth daily., Disp: 90 tablet, Rfl: 1   lidocaine-prilocaine (EMLA) cream, Apply 1 Application topically as needed., Disp: 30 g, Rfl: 0   metoprolol succinate (TOPROL-XL) 25 MG 24 hr tablet, TAKE 1 TABLET BY MOUTH ONCE A DAY **MAKE FOLLOW UP APPT WITH  DR. KHAN FOR REFILLS**, Disp: 90 tablet, Rfl: 0   ondansetron (ZOFRAN) 4 MG tablet, Take 1 tablet (4 mg total) by mouth every 8 (eight) hours as needed., Disp: 10 tablet, Rfl: 0   oxyCODONE (ROXICODONE) 5 MG immediate release tablet, Take 1 tablet (5 mg total) by mouth every 8 (eight) hours as needed., Disp: 6 tablet, Rfl: 0   potassium chloride SA (KLOR-CON M) 20 MEQ tablet, Take 1 tablet (20 mEq total) by mouth  daily for 7 days., Disp: 7 tablet, Rfl: 0   silver sulfADIAZINE (SILVADENE) 1 % cream, Apply 1 Application topically 2 (two) times daily. Apply to affected area as directed., Disp: 50 g, Rfl: 1 No current facility-administered medications for this visit.  Facility-Administered Medications Ordered in Other Visits:    heparin lock flush 100 unit/mL, 500 Units, Intravenous, Once, Michaelyn Barter, MD   heparin lock flush 100 unit/mL, 500 Units, Intravenous, Once, Michaelyn Barter, MD   heparin lock flush 100 unit/mL, 500 Units, Intracatheter, Once PRN, Michaelyn Barter, MD   sodium chloride flush (NS) 0.9 % injection 10 mL, 10 mL, Intravenous, PRN, Michaelyn Barter, MD, 10 mL at 11/01/23 1021  Physical exam:  Vitals:   01/06/24 1331  BP: (!) 128/96  Pulse: 98  Resp: 19  SpO2: 98%  Weight: 207 lb 4.8 oz (94 kg)   Physical Exam Constitutional:      Appearance: She is not ill-appearing.  Pulmonary:     Effort: Pulmonary effort is normal.  Chest:     Comments: Right sided port Musculoskeletal:     Right shoulder: No swelling, tenderness, bony tenderness or crepitus. Normal range of motion.     Right upper arm: No swelling, edema, tenderness or bony tenderness.     Right elbow: No swelling, deformity, effusion or lacerations. Tenderness present in medial epicondyle and lateral epicondyle.     Right wrist: No swelling, effusion or tenderness. Normal range of motion.     Right hand: No tenderness. Decreased strength. Normal capillary refill. Normal pulse.  Skin:    Coloration: Skin is  not pale.  Neurological:     Mental Status: She is alert and oriented to person, place, and time.  Psychiatric:        Mood and Affect: Mood normal.        Behavior: Behavior normal.     No results found.  Assessment and plan- Patient is a 38 y.o. female diagnosed with breast cancer, currently on herceptin and perjeta who presents to symptom management clinic for complaints of:   Right arm pain- check ultrasound to rule out VTE in setting of right sided port-a-cath. If negative, likely msk etiology- suspect medial and lateral epicondylitis. Treat with steroid taper and topical voltaren. Encourage rest. Can consider PT and support brace.    Disposition:  Stat US right arm F/u as scheduled- la   Visit Diagnosis 1. Right arm pain    Patient expressed understanding and was in agreement with this plan. She also understands that She can call clinic at any time with any questions, concerns, or complaints.   Thank you for allowing me to participate in the care of this very pleasant patient.   Consuello Masse, DNP, AGNP-C, AOCNP [facility] [phone]  CC: Dr Alena Bills

## 2024-01-06 NOTE — Progress Notes (Signed)
Patient is here today because she is having right arm pain. Patient states that when she extends her right arm its sore and tight. Arm is sore to the touch. Patient states it started back in December but she figured it was because she was using her left arm less due to the breast cancer dx. Patient states arm has got worse over time. Patient states while lifting things or trying to get stuff out the fridge makes her arm feel like its going to give out. Patient denies any falls or getting hurt.

## 2024-01-06 NOTE — Telephone Encounter (Signed)
Patient coming at 130 to see Lauren, NP. today

## 2024-01-13 ENCOUNTER — Other Ambulatory Visit: Payer: Self-pay

## 2024-01-13 ENCOUNTER — Emergency Department: Payer: Managed Care, Other (non HMO)

## 2024-01-13 ENCOUNTER — Emergency Department
Admission: EM | Admit: 2024-01-13 | Discharge: 2024-01-14 | Discharge status: Against Medical Advice | Payer: Managed Care, Other (non HMO) | Attending: Emergency Medicine | Admitting: Emergency Medicine

## 2024-01-13 DIAGNOSIS — R519 Headache, unspecified: Secondary | ICD-10-CM | POA: Insufficient documentation

## 2024-01-13 DIAGNOSIS — R000 Tachycardia, unspecified: Secondary | ICD-10-CM | POA: Diagnosis not present

## 2024-01-13 DIAGNOSIS — D0590 Unspecified type of carcinoma in situ of unspecified breast: Secondary | ICD-10-CM | POA: Insufficient documentation

## 2024-01-13 DIAGNOSIS — R0789 Other chest pain: Secondary | ICD-10-CM | POA: Insufficient documentation

## 2024-01-13 DIAGNOSIS — Z5321 Procedure and treatment not carried out due to patient leaving prior to being seen by health care provider: Secondary | ICD-10-CM | POA: Diagnosis not present

## 2024-01-13 LAB — CBC
HCT: 37.5 % (ref 36.0–46.0)
Hemoglobin: 12.2 g/dL (ref 12.0–15.0)
MCH: 27.8 pg (ref 26.0–34.0)
MCHC: 32.5 g/dL (ref 30.0–36.0)
MCV: 85.4 fL (ref 80.0–100.0)
Platelets: 327 10*3/uL (ref 150–400)
RBC: 4.39 MIL/uL (ref 3.87–5.11)
RDW: 13.3 % (ref 11.5–15.5)
WBC: 8.8 10*3/uL (ref 4.0–10.5)
nRBC: 0 % (ref 0.0–0.2)

## 2024-01-13 LAB — BASIC METABOLIC PANEL
Anion gap: 11 (ref 5–15)
BUN: 22 mg/dL — ABNORMAL HIGH (ref 6–20)
CO2: 24 mmol/L (ref 22–32)
Calcium: 9.6 mg/dL (ref 8.9–10.3)
Chloride: 104 mmol/L (ref 98–111)
Creatinine, Ser: 0.71 mg/dL (ref 0.44–1.00)
GFR, Estimated: >60 mL/min (ref >60)
Glucose, Bld: 97 mg/dL (ref 70–99)
Potassium: 3.7 mmol/L (ref 3.5–5.1)
Sodium: 139 mmol/L (ref 135–145)

## 2024-01-13 LAB — TROPONIN I (HIGH SENSITIVITY): Troponin I (High Sensitivity): <2 ng/L (ref <18)

## 2024-01-13 NOTE — ED Triage Notes (Signed)
Pt states that she had sudden onset of midsternal chest pain that she describes as pressure. Pt also reports that she felt lightheaded and like her heart was racing. Pt takes metoprolol for tachycardia and she is currently being treated for breast cancer with immunotherapy. (Last treatment 01/03/24)

## 2024-01-13 NOTE — ED Provider Triage Note (Signed)
Emergency Medicine Provider Triage Evaluation Note  Cristina Baker , a 38 y.o. female  was evaluated in triage.  Pt complains of chest pressure for the past hour and feeling lightheaded and dizzy. Felt like heart rate was elevated and has had a headache. Breast cancer patient on immunotherapy.  Physical Exam  There were no vitals taken for this visit. Gen:   Awake, no distress   Resp:  Normal effort  MSK:   Moves extremities without difficulty  Other:    Medical Decision Making  Medically screening exam initiated at 8:53 PM.  Appropriate orders placed.  Cristina Baker was informed that the remainder of the evaluation will be completed by another provider, this initial triage assessment does not replace that evaluation, and the importance of remaining in the ED until their evaluation is complete.  Cardiac protocols started.   Chinita Pester, FNP 01/13/24 2054

## 2024-01-14 NOTE — ED Notes (Signed)
Pt to desk asking about wait times. Pt educated on wait times.

## 2024-01-14 NOTE — ED Notes (Signed)
Pt to desk to advise she is not waiting and will call her oncologist tomorrow.

## 2024-01-18 ENCOUNTER — Other Ambulatory Visit: Payer: Self-pay | Admitting: Internal Medicine

## 2024-01-18 DIAGNOSIS — F909 Attention-deficit hyperactivity disorder, unspecified type: Secondary | ICD-10-CM

## 2024-01-19 ENCOUNTER — Encounter: Payer: Self-pay | Admitting: Internal Medicine

## 2024-01-19 NOTE — Addendum Note (Signed)
 Addended by: Loudon Krakow on: 01/19/2024 12:03 PM   Modules accepted: Orders

## 2024-01-20 ENCOUNTER — Encounter: Payer: Self-pay | Admitting: Internal Medicine

## 2024-01-23 ENCOUNTER — Telehealth: Payer: Self-pay

## 2024-01-23 ENCOUNTER — Encounter: Payer: Self-pay | Admitting: Internal Medicine

## 2024-01-23 ENCOUNTER — Other Ambulatory Visit: Payer: Self-pay | Admitting: Internal Medicine

## 2024-01-23 DIAGNOSIS — F909 Attention-deficit hyperactivity disorder, unspecified type: Secondary | ICD-10-CM

## 2024-01-23 MED ORDER — AMPHETAMINE-DEXTROAMPHETAMINE 20 MG PO TABS
20.0000 mg | ORAL_TABLET | Freq: Two times a day (BID) | ORAL | 0 refills | Status: DC
Start: 2024-01-23 — End: 2024-02-10

## 2024-01-23 NOTE — Telephone Encounter (Signed)
 I let my scheduler, Felipa Horsfall, know that patient tested positive for Covid-19, and that we need to reschedule her since she is down to see Dr. Aris Bel tomorrow, 01/24/2024.

## 2024-01-23 NOTE — Addendum Note (Signed)
 Addended by: Lizzie Cokley on: 01/23/2024 09:54 AM   Modules accepted: Orders

## 2024-01-24 ENCOUNTER — Inpatient Hospital Stay: Payer: Managed Care, Other (non HMO) | Admitting: Internal Medicine

## 2024-01-24 ENCOUNTER — Inpatient Hospital Stay: Payer: Managed Care, Other (non HMO)

## 2024-01-24 ENCOUNTER — Inpatient Hospital Stay: Payer: Managed Care, Other (non HMO) | Attending: Internal Medicine

## 2024-01-31 ENCOUNTER — Encounter: Payer: Self-pay | Admitting: Internal Medicine

## 2024-01-31 ENCOUNTER — Inpatient Hospital Stay: Payer: Managed Care, Other (non HMO)

## 2024-01-31 ENCOUNTER — Other Ambulatory Visit: Payer: Self-pay

## 2024-01-31 ENCOUNTER — Inpatient Hospital Stay: Payer: Managed Care, Other (non HMO) | Attending: Internal Medicine | Admitting: Internal Medicine

## 2024-01-31 VITALS — BP 124/83 | HR 105 | Temp 96.7°F | Resp 18

## 2024-01-31 VITALS — HR 97

## 2024-01-31 DIAGNOSIS — C50912 Malignant neoplasm of unspecified site of left female breast: Secondary | ICD-10-CM

## 2024-01-31 DIAGNOSIS — Z79620 Long term (current) use of immunosuppressive biologic: Secondary | ICD-10-CM | POA: Insufficient documentation

## 2024-01-31 DIAGNOSIS — Z79899 Other long term (current) drug therapy: Secondary | ICD-10-CM | POA: Diagnosis not present

## 2024-01-31 DIAGNOSIS — Z171 Estrogen receptor negative status [ER-]: Secondary | ICD-10-CM | POA: Diagnosis not present

## 2024-01-31 DIAGNOSIS — C50412 Malignant neoplasm of upper-outer quadrant of left female breast: Secondary | ICD-10-CM | POA: Insufficient documentation

## 2024-01-31 DIAGNOSIS — Z5112 Encounter for antineoplastic immunotherapy: Secondary | ICD-10-CM | POA: Diagnosis present

## 2024-01-31 MED ORDER — DIPHENHYDRAMINE HCL 25 MG PO CAPS
50.0000 mg | ORAL_CAPSULE | Freq: Once | ORAL | Status: AC
  Administered 2024-01-31: 50 mg via ORAL
  Filled 2024-01-31: qty 2

## 2024-01-31 MED ORDER — SODIUM CHLORIDE 0.9 % IV SOLN
Freq: Once | INTRAVENOUS | Status: AC
  Filled 2024-01-31: qty 250

## 2024-01-31 MED ORDER — ACETAMINOPHEN 325 MG PO TABS
650.0000 mg | ORAL_TABLET | Freq: Once | ORAL | Status: AC
  Administered 2024-01-31: 650 mg via ORAL
  Filled 2024-01-31: qty 2

## 2024-01-31 MED ORDER — SODIUM CHLORIDE 0.9 % IV SOLN
6.0000 mg/kg | Freq: Once | INTRAVENOUS | Status: AC
  Administered 2024-01-31: 525 mg via INTRAVENOUS
  Filled 2024-01-31: qty 25

## 2024-01-31 MED ORDER — SODIUM CHLORIDE 0.9 % IV SOLN
420.0000 mg | Freq: Once | INTRAVENOUS | Status: AC
  Administered 2024-01-31: 420 mg via INTRAVENOUS
  Filled 2024-01-31: qty 14

## 2024-01-31 NOTE — Patient Instructions (Signed)
 [facility] - A DEPT OF [facility]  Discharge Instructions: Thank you for choosing [facility] to provide your oncology and hematology care.  If you have a lab appointment with the Cancer Center, please go directly to the Cancer Center and check in at the registration area.  Wear comfortable clothing and clothing appropriate for easy access to any Portacath or PICC line.   We strive to give you quality time with your provider. You may need to reschedule your appointment if you arrive late (15 or more minutes).  Arriving late affects you and other patients whose appointments are after yours.  Also, if you miss three or more appointments without notifying the office, you may be dismissed from the clinic at the provider's discretion.      For prescription refill requests, have your pharmacy contact our office and allow 72 hours for refills to be completed.    Today you received the following chemotherapy and/or immunotherapy agents Kanjinti & Perjeta      To help prevent nausea and vomiting after your treatment, we encourage you to take your nausea medication as directed.  BELOW ARE SYMPTOMS THAT SHOULD BE REPORTED IMMEDIATELY: *FEVER GREATER THAN 100.4 F (38 C) OR HIGHER *CHILLS OR SWEATING *NAUSEA AND VOMITING THAT IS NOT CONTROLLED WITH YOUR NAUSEA MEDICATION *UNUSUAL SHORTNESS OF BREATH *UNUSUAL BRUISING OR BLEEDING *URINARY PROBLEMS (pain or burning when urinating, or frequent urination) *BOWEL PROBLEMS (unusual diarrhea, constipation, pain near the anus) TENDERNESS IN MOUTH AND THROAT WITH OR WITHOUT PRESENCE OF ULCERS (sore throat, sores in mouth, or a toothache) UNUSUAL RASH, SWELLING OR PAIN  UNUSUAL VAGINAL DISCHARGE OR ITCHING   Items with * indicate a potential emergency and should be followed up as soon as possible or go to the Emergency Department if any problems should occur.  Please show the CHEMOTHERAPY ALERT CARD or  IMMUNOTHERAPY ALERT CARD at check-in to the Emergency Department and triage nurse.  Should you have questions after your visit or need to cancel or reschedule your appointment, please contact [facility] - A DEPT OF [facility]  [phone] and follow the prompts.  Office hours are 8:00 a.m. to 4:30 p.m. Monday - Friday. Please note that voicemails left after 4:00 p.m. may not be returned until the following business day.  We are closed weekends and major holidays. You have access to a nurse at all times for urgent questions. Please call the main number to the clinic [phone] and follow the prompts.  For any non-urgent questions, you may also contact your provider using MyChart. We now offer e-Visits for anyone 35 and older to request care online for non-urgent symptoms. For details visit [email or web address].   Also download the MyChart app! Go to the app store, search "MyChart", open the app, select [place], and log in with your MyChart username and password.

## 2024-01-31 NOTE — Patient Instructions (Signed)

## 2024-01-31 NOTE — Progress Notes (Signed)
[facility] CONSULT NOTE  Patient Care Team: Sherron Monday, MD as PCP - General (Internal Medicine) Hulen Luster, RN as Oncology Nurse Navigator Michaelyn Barter, MD as Consulting Physician (Oncology) Carmina Miller, MD as Consulting Physician (Radiation Oncology)  CANCER STAGING   Cancer Staging  Breast cancer [facility]) Staging form: Breast, AJCC 8th Edition - Clinical: Stage IIB (cT2, cN1, cM0, G3, ER-, PR-, HER2+) - Signed by Michaelyn Barter, MD on 02/17/2023 Histologic grading system: 3 grade system   ASSESSMENT & PLAN:  Cristina Baker 38 y.o. 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  # Left breast invasive ductal cancer, ER/PR negative, HER2 positive, stage IIb -Self detected left breast mass.  Status post biopsy on 02/10/2023.  - Completed neoadjuvant TCHP x 6 cycle.    - s/p left breast lumpectomy with SLNB with Dr. Tonna Boehringer on 07/28/2023.  Pathology showed path CR.  0/3 lymph nodes negative for malignancy. -Completed adjuvant radiation from 08/29/2023 to 10/13/2023. -Has been on maintenance Herceptin and Perjeta.  Cycle 14.  Tolerating well.  We are getting labs with alternate cycles.  Labs today reviewed and acceptable for treatment. -Schedule for echocardiogram needed on Herceptin prior to next cycle -Will schedule for bilateral diagnostic mammogram on February 11, 2024.  # Right elbow pain -Venous Doppler was negative for DVT. -Likely epicondylitis.  Started on steroid taper by [facility].  Could not tolerate which caused elevated heart rate, dizziness.  She presented to ER but left her symptoms improved and ER was busy.  Has been feeling well since.  # Right ninth rib lesion -Questionable.  Last CT in May 2024 showed stable 7 mm lesion.  Case was previously discussed with Dr. Ruthe Mannan from IR and lesion was too small to biopsy.   Repeat CT scan showed stable lesion likely bony island.  -CT chest from 11/13/2023 showed small 3 mm right middle lobe nodule.  Likely benign but surveillance recommended with her history of breast cancer.  Will repeat CT in about 3 to 4 months.  # Chemotherapy-induced neuropathy -Managed without medications  # Family history of breast cancer -Genetic testing was negative  # Anxiety -Xanax 0.25 mg twice daily as needed  # History of palpitations  -Reportedly, she had workup with cardiology which was negative.  It was deemed secondary to panic. -On metoprolol.  # Access-port placed by Dr. Tonna Boehringer.  Has Emla cream  Orders Placed This Encounter  Procedures   CBC with Differential/Platelet    Standing Status:   Future    Expected Date:   03/13/2024    Expiration Date:   01/30/2025   Comprehensive metabolic panel    Standing Status:   Future    Expected Date:   03/13/2024    Expiration Date:   01/30/2025   ECHOCARDIOGRAM COMPLETE    Standing Status:   Future    Expected Date:   02/07/2024    Expiration Date:   01/30/2025    Scheduling Instructions:     Monitoring for cardiotoxicity- patient on herceptin    Where should this test be performed:   [facility]    Please indicate who you request to read the nuc med / echo results.:   [facility] Readers    Perflutren DEFINITY (image enhancing agent) should be administered unless hypersensitivity or allergy exist:   Administer Perflutren    Reason for exam-Echo:  Chemo  Z09   RTC in 3 weeks for Herceptin Perjeta RTC in 6 weeks for MD visit, labs, Herceptin Perjeta.  The total time spent in the appointment was 30 minutes encounter with patients including review of chart and various tests results, discussions about plan of care and coordination of care plan   All questions were answered. The patient knows to call the clinic with any problems, questions or concerns. No barriers to learning was detected.  Michaelyn Barter, MD 2/18/20251:28  PM   HISTORY OF PRESENTING ILLNESS:  Cristina Baker 38 y.o. 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  She denies any pain or tenderness in the breast.  Denies any discharge.  There is family history of breast cancer in paternal grandmother and 2 aunts.  She had a BRCA testing about 15 years ago which was negative.  She has completed family-planning.  Underwent tubal ligation.  She has 3 children 1 in 52s second in his teen and the youngest is 40 years old.    Interval history Patient was seen today as follow-up to start her maintenance Herceptin and Perjeta. Last week she had COVID infection.  3 days were rough.  Now she is feeling better. Continues to have right elbow pain. Could not tolerate prednisone taper. Had elevated heart rate, chest pressure. Presented to ER but eventually left since it was too busy. Symptoms have since resolved  I have reviewed her chart and materials related to her cancer extensively and collaborated history with the patient. Summary of oncologic history is as follows: Oncology History  Breast cancer (HCC)  01/26/2023 Initial Diagnosis   Patient felt palpable lump in left breast   02/10/2023 Mammogram     IMPRESSION: 1. There is a suspicious 24 mm mass at the site of palpable concern which is concerning for malignancy. It demonstrates 3 cm of associated calcifications. Recommend ultrasound-guided biopsy for definitive characterization with attention on post marker placement mammogram to assess for extent of calcifications in relation to the biopsy marking clip. 2. There is an indeterminate LEFT axillary lymph node. Recommend ultrasound-guided biopsy for definitive characterization. 3. No mammographic evidence of malignancy in the RIGHT breast.   02/10/2023 Pathology Results   DIAGNOSIS: A.  BREAST, LEFT; STEREOTACTIC CORE  BIOPSY: - INVASIVE MAMMARY CARCINOMA, NO SPECIAL TYPE (DUCTAL CARCINOMA).  Size of invasive carcinoma: 1.7 cm in linear length in this sample Histologic grade of invasive carcinoma: Grade 3 Glandular/tubular differentiation score: 3 Nuclear pleomorphism score: 3 Mitotic rate score: 2 Total score: 8 Ductal carcinoma in situ: Present, high-grade Lymphovascular invasion: Indeterminate See comment.  B.  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   Estrogen Receptor (ER) Status: NEGATIVE (LESS THAN 1%)         Internal control cells positive Progesterone Receptor (PgR) Status: Negative HER2 (by immunohistochemistry): Positive Ki-67: Not performed    02/17/2023 Cancer Staging   Staging form: Breast, AJCC 8th Edition - Clinical: Stage IIB (cT2, cN1, cM0, G3, ER-, PR-, HER2+) - Signed by Michaelyn Barter, MD on 02/17/2023 Histologic grading system: 3 grade system   03/07/2023 - 06/21/2023 Chemotherapy   Patient is on Treatment Plan : BREAST  Docetaxel + Carboplatin + Trastuzumab + Pertuzumab  (TCHP) q21d       Genetic Testing   Negative genetic testing. No pathogenic variants identified on the  [facility] Multi-Cancer+RNA panel. The report date is 03/01/2023.  The Multi-Cancer + RNA Panel offered by [facility] includes sequencing and/or deletion/duplication analysis of the following 70 genes:  AIP*, ALK, APC*, ATM*, AXIN2*, BAP1*, BARD1*, BLM*, BMPR1A*, BRCA1*, BRCA2*, BRIP1*, CDC73*, CDH1*, CDK4, CDKN1B*, CDKN2A, CHEK2*, CTNNA1*, DICER1*, EPCAM, EGFR, FH*, FLCN*, GREM1, HOXB13, KIT, LZTR1, MAX*, MBD4, MEN1*, MET, MITF, MLH1*, MSH2*, MSH3*, MSH6*, MUTYH*, NF1*, NF2*, NTHL1*, PALB2*, PDGFRA, PMS2*, POLD1*, POLE*, POT1*, PRKAR1A*, PTCH1*, PTEN*, RAD51C*, RAD51D*, RB1*, RET, SDHA*, SDHAF2*, SDHB*, SDHC*, SDHD*, SMAD4*, SMARCA4*, SMARCB1*, SMARCE1*, STK11*, SUFU*, TMEM127*, TP53*, TSC1*, TSC2*, VHL*. RNA analysis is performed for * genes.    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    Family history of breast cancer 2015   IBIS=18.8%   Family history of ovarian cancer    Heart palpitations    Herpes genitalis    Hypomagnesemia    Leg swelling    Leukocytosis    Lichen sclerosus    Mixed hyperlipidemia    PAC (premature atrial contraction)    PONV (postoperative nausea and vomiting)    nausea   PVC's (premature ventricular contractions)    Tachycardia, unspecified     SURGICAL HISTORY: Past Surgical History:  Procedure Laterality Date   BREAST BIOPSY Left 02/10/2023   invasive mammary carcinoma   BREAST BIOPSY Left 02/10/2023   [place] Core Hydromark clip path pending   BREAST BIOPSY Left 02/10/2023   [place] LT BREAST BX W LOC DEV 1ST LESION IMG BX SPEC US GUIDE 02/10/2023 [facility]   BREAST BIOPSY Left 06/30/2023   [place] LT RADIO FREQUENCY TAG EA ADD LESION LOC US GUIDE 06/30/2023 [facility]   BREAST LUMPECTOMY,RADIO FREQ LOCALIZER,AXILLARY SENTINEL LYMPH NODE BIOPSY Left 07/28/2023   Procedure: BREAST LUMPECTOMY,RADIO FREQ LOCALIZER,AXILLARY SENTINEL LYMPH NODE BIOPSY;  Surgeon: Sung Amabile, DO;  Location: [facility];  Service: General;  Laterality: Left;   CESAREAN SECTION  01/03/2007   CESAREAN SECTION WITH BILATERAL TUBAL LIGATION N/A 03/26/2016   Procedure: CESAREAN SECTION WITH BILATERAL TUBAL LIGATION;  Surgeon: Conard Novak, MD;  Location: [facility];  Service: Obstetrics;  Laterality: N/A;    COLPOSCOPY     IUD REMOVAL     PORTACATH PLACEMENT N/A 02/24/2023   Procedure: INSERTION PORT-A-CATH;  Surgeon: Sung Amabile, DO;  Location: [facility];  Service: General;  Laterality: N/A;    SOCIAL HISTORY: Social History   Socioeconomic History   Marital status: Married    Spouse name: Verdon Cummins   Number of children: 3   Years of education: Not on file   Highest education level: Not on file  Occupational History   Not on file  Tobacco Use   Smoking status: Former    Current packs/day: 0.00    Types: Cigarettes    Quit date: 2022    Years since quitting: 3.1   Smokeless tobacco: Never  Vaping Use   Vaping status:  Never Used  Substance and Sexual Activity   Alcohol use: Yes    Comment: rarely   Drug use: No   Sexual activity: Yes    Birth control/protection: Surgical    Comment: tubal ligation  Other Topics Concern   Not on file  Social History Narrative   Not on file   Social Drivers of Health   Financial Resource Strain: Low Risk  (04/05/2023)   Overall Financial Resource Strain (CARDIA)    Difficulty of Paying Living Expenses: Not very hard  Food Insecurity: No Food Insecurity (04/05/2023)   Hunger Vital Sign    Worried About Running Out of Food in the Last Year: Never true    Ran Out of Food in the Last Year: Never true  Transportation Needs: No Transportation Needs (02/17/2023)   PRAPARE - Administrator, Civil Service (Medical): No    Lack of Transportation (Non-Medical): No  Physical Activity: Inactive (04/05/2023)   Exercise Vital Sign    Days of Exercise per Week: 0 days    Minutes of Exercise per Session: 0 min  Stress: Stress Concern Present (04/05/2023)   [employer] of Occupational Health - Occupational Stress Questionnaire    Feeling of Stress : To some extent  Social Connections: Socially Integrated (04/05/2023)   Social Connection and Isolation Panel [NHANES]    Frequency of Communication with Friends and Family: Three times a week     Frequency of Social Gatherings with Friends and Family: Once a week    Attends Religious Services: 1 to 4 times per year    Active Member of [employer] or Organizations: Yes    Attends Banker Meetings: 1 to 4 times per year    Marital Status: Married  Catering manager Violence: Not At Risk (02/17/2023)   Humiliation, Afraid, Rape, and Kick questionnaire    Fear of Current or Ex-Partner: No    Emotionally Abused: No    Physically Abused: No    Sexually Abused: No    FAMILY HISTORY: Family History  Problem Relation Age of Onset   Hypercholesterolemia Mother    Diabetes Mellitus II Father    Hypertension Father    Breast cancer Paternal Aunt 32   Breast cancer Paternal Aunt 15   Breast cancer Paternal Grandmother        21s   Throat cancer Paternal Grandfather    Heart attack Paternal Grandfather     ALLERGIES:  has no known allergies.  MEDICATIONS:  Current Outpatient Medications  Medication Sig Dispense Refill   ALPRAZolam (XANAX) 0.5 MG tablet USE AS DIRECTED TAKE HALF TAB TWICE DAILY FOR ANXIETY 15 tablet 0   amphetamine-dextroamphetamine (ADDERALL) 20 MG tablet Take 1 tablet (20 mg total) by mouth 2 (two) times daily for 18 days. 36 tablet 0   aspirin-acetaminophen-caffeine (EXCEDRIN MIGRAINE) 250-250-65 MG tablet Take 1 tablet by mouth every 6 (six) hours as needed for headache.     clobetasol ointment (TEMOVATE) 0.05 % Apply to affected area once wkly as maintenance 45 g 0   diclofenac Sodium (VOLTAREN) 1 % GEL Apply 2 g topically 2 (two) times daily as needed. To right elbow 2 g 0   escitalopram (LEXAPRO) 10 MG tablet Take 1 tablet (10 mg total) by mouth daily. 90 tablet 1   lidocaine-prilocaine (EMLA) cream Apply 1 Application topically as needed. 30 g 0   metoprolol succinate (TOPROL-XL) 25 MG 24 hr tablet TAKE 1 TABLET BY MOUTH ONCE A DAY **MAKE FOLLOW UP APPT  WITH DR. KHAN FOR REFILLS** 90 tablet 0   ondansetron (ZOFRAN) 4 MG tablet Take 1 tablet (4 mg total)  by mouth every 8 (eight) hours as needed. 10 tablet 0   No current facility-administered medications for this visit.   Facility-Administered Medications Ordered in Other Visits  Medication Dose Route Frequency Provider Last Rate Last Admin   0.9 %  sodium chloride infusion   Intravenous Once Michaelyn Barter, MD       acetaminophen (TYLENOL) tablet 650 mg  650 mg Oral Once Michaelyn Barter, MD       diphenhydrAMINE (BENADRYL) capsule 50 mg  50 mg Oral Once Michaelyn Barter, MD       heparin lock flush 100 unit/mL  500 Units Intravenous Once Michaelyn Barter, MD       heparin lock flush 100 unit/mL  500 Units Intravenous Once Michaelyn Barter, MD       heparin lock flush 100 unit/mL  500 Units Intracatheter Once PRN Michaelyn Barter, MD       pertuzumab (PERJETA) 420 mg in sodium chloride 0.9 % 250 mL chemo infusion  420 mg Intravenous Once Michaelyn Barter, MD       sodium chloride flush (NS) 0.9 % injection 10 mL  10 mL Intravenous PRN Michaelyn Barter, MD   10 mL at 11/01/23 1021   trastuzumab-anns (KANJINTI) 525 mg in sodium chloride 0.9 % 250 mL chemo infusion  6 mg/kg (Treatment Plan Recorded) Intravenous Once Michaelyn Barter, MD        REVIEW OF SYSTEMS:   Pertinent information mentioned in HPI All other systems were reviewed with the patient and are negative.  PHYSICAL EXAMINATION: ECOG PERFORMANCE STATUS: 0 - Asymptomatic  Vitals:   01/31/24 1300  BP: 124/83  Pulse: (!) 105  Resp: 18  Temp: (!) 96.7 F (35.9 C)     There were no vitals filed for this visit.    GENERAL:alert, no distress and comfortable SKIN: skin color, texture, turgor are normal, no rashes or significant lesions EYES: normal, conjunctiva are pink and non-injected, sclera clear OROPHARYNX:no exudate, no erythema and lips, buccal mucosa, and tongue normal  NECK: supple, thyroid normal size, non-tender, without nodularity LYMPH:  no palpable lymphadenopathy in the cervical, axillary or inguinal LUNGS: clear  to auscultation and percussion with normal breathing effort HEART: regular rate & rhythm and no murmurs and no lower extremity edema ABDOMEN:abdomen soft, non-tender and normal bowel sounds Musculoskeletal:no cyanosis of digits and no clubbing  PSYCH: alert & oriented x 3 with fluent speech NEURO: no focal motor/sensory deficits  LABORATORY DATA:  I have reviewed the data as listed Lab Results  Component Value Date   WBC 8.8 01/13/2024   HGB 12.2 01/13/2024   HCT 37.5 01/13/2024   MCV 85.4 01/13/2024   PLT 327 01/13/2024   Recent Labs    11/01/23 1018 11/13/23 0749 12/13/23 1029 01/13/24 2057  NA 141 140 139 139  K 3.3* 3.4* 3.2* 3.7  CL 106 106 105 104  CO2 25 24 24 24   GLUCOSE 100* 145* 140* 97  BUN 19 28* 19 22*  CREATININE 0.71 0.98 0.65 0.71  CALCIUM 9.5 9.3 9.4 9.6  GFRNONAA >60 >60 >60 >60  PROT 7.0 7.1 6.9  --   ALBUMIN 3.7 3.7 3.6  --   AST 21 18 21   --   ALT 26 25 26   --   ALKPHOS 85 88 90  --   BILITOT 0.6 0.5 0.7  --  RADIOGRAPHIC STUDIES: I have personally reviewed the radiological images as listed and agreed with the findings in the report. DG Chest 2 View Result Date: 01/13/2024 CLINICAL DATA:  Chest pain and light headedness and dizziness. EXAM: CHEST - 2 VIEW COMPARISON:  05/31/2023 FINDINGS: The right IJ Port-A-Cath is stable. The cardiac silhouette, mediastinal and hilar contours are normal. The lungs are clear. No infiltrates or effusions. No pulmonary lesions. The bony thorax is intact. IMPRESSION: No acute cardiopulmonary findings. Electronically Signed   By: Rudie Meyer M.D.   On: 01/13/2024 21:38   US Venous Img Upper Uni Right Result Date: 01/06/2024 CLINICAL DATA:  Right arm pain.  History of breast cancer. EXAM: Right UPPER EXTREMITY VENOUS DOPPLER ULTRASOUND TECHNIQUE: Gray-scale sonography with graded compression, as well as color Doppler and duplex ultrasound were performed to evaluate the upper extremity deep venous system from the  level of the subclavian vein and including the jugular, axillary, basilic, radial, ulnar and upper cephalic vein. Spectral Doppler was utilized to evaluate flow at rest and with distal augmentation maneuvers. COMPARISON:  None Available. FINDINGS: Contralateral Subclavian Vein: Respiratory phasicity is normal and symmetric with the symptomatic side. No evidence of thrombus. Normal compressibility. Internal Jugular Vein: No evidence of thrombus. Normal compressibility, respiratory phasicity and response to augmentation. Subclavian Vein: No evidence of thrombus. Normal compressibility, respiratory phasicity and response to augmentation. Axillary Vein: No evidence of thrombus. Normal compressibility, respiratory phasicity and response to augmentation. Cephalic Vein: No evidence of thrombus. Normal compressibility, respiratory phasicity and response to augmentation. Basilic Vein: No evidence of thrombus. Normal compressibility, respiratory phasicity and response to augmentation. Brachial Veins: No evidence of thrombus. Normal compressibility, respiratory phasicity and response to augmentation. Radial Veins: No evidence of thrombus. Normal compressibility, respiratory phasicity and response to augmentation. Ulnar Veins: No evidence of thrombus. Normal compressibility, respiratory phasicity and response to augmentation. Venous Reflux:  None visualized. Other Findings:  None visualized. IMPRESSION: No evidence of DVT within the right upper extremity. Electronically Signed   By: Paulina Fusi M.D.   On: 01/06/2024 16:41

## 2024-02-08 ENCOUNTER — Other Ambulatory Visit: Payer: Managed Care, Other (non HMO)

## 2024-02-08 DIAGNOSIS — E782 Mixed hyperlipidemia: Secondary | ICD-10-CM

## 2024-02-09 LAB — LIPID PANEL
Chol/HDL Ratio: 5.7 {ratio} — ABNORMAL HIGH (ref 0.0–4.4)
Cholesterol, Total: 246 mg/dL — ABNORMAL HIGH (ref 100–199)
HDL: 43 mg/dL (ref >39)
LDL Chol Calc (NIH): 164 mg/dL — ABNORMAL HIGH (ref 0–99)
Triglycerides: 212 mg/dL — ABNORMAL HIGH (ref 0–149)
VLDL Cholesterol Cal: 39 mg/dL (ref 5–40)

## 2024-02-10 ENCOUNTER — Encounter: Payer: Self-pay | Admitting: Internal Medicine

## 2024-02-10 ENCOUNTER — Ambulatory Visit (INDEPENDENT_AMBULATORY_CARE_PROVIDER_SITE_OTHER): Payer: Managed Care, Other (non HMO) | Admitting: Internal Medicine

## 2024-02-10 VITALS — BP 142/76 | HR 107 | Temp 98.2°F | Ht 66.0 in | Wt 205.0 lb

## 2024-02-10 DIAGNOSIS — E782 Mixed hyperlipidemia: Secondary | ICD-10-CM

## 2024-02-10 DIAGNOSIS — E669 Obesity, unspecified: Secondary | ICD-10-CM

## 2024-02-10 DIAGNOSIS — F909 Attention-deficit hyperactivity disorder, unspecified type: Secondary | ICD-10-CM | POA: Diagnosis not present

## 2024-02-10 MED ORDER — AMPHETAMINE-DEXTROAMPHETAMINE 20 MG PO TABS: 20.0000 mg | ORAL_TABLET | Freq: Two times a day (BID) | ORAL | 0 refills | Status: DC

## 2024-02-10 NOTE — Progress Notes (Signed)
 Established Patient Office Visit  Subjective:  Patient ID: Cristina Baker, female    DOB: [birth year]  Age: 38 y.o. MRN: [id number]  Chief Complaint  Patient presents with   Follow-up    6 Months Lab Results    No new complaints, here for med refills. BP a little higher than usual but has gained a significant amount of weight. Lipids have deteriorated on lab review.    No other concerns at this time.   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    Family history of breast cancer 2015   IBIS=18.8%   Family history of ovarian cancer    Heart palpitations    Herpes genitalis    Hypomagnesemia    Leg swelling    Leukocytosis    Lichen sclerosus    Mixed hyperlipidemia    PAC (premature atrial contraction)    PONV (postoperative nausea and vomiting)    nausea   PVC'Akelia Husted (premature ventricular contractions)    Tachycardia, unspecified     Past Surgical History:  Procedure Laterality Date   BREAST BIOPSY Left 02/10/2023   invasive mammary carcinoma   BREAST BIOPSY Left 02/10/2023   [place] Core Hydromark clip path pending   BREAST BIOPSY Left 02/10/2023   [place] LT BREAST BX W LOC DEV 1ST LESION IMG BX SPEC US GUIDE 02/10/2023 [facility]   BREAST BIOPSY Left 06/30/2023   [place] LT RADIO FREQUENCY TAG EA ADD LESION LOC US GUIDE 06/30/2023 [facility]   BREAST LUMPECTOMY,RADIO FREQ LOCALIZER,AXILLARY SENTINEL LYMPH NODE BIOPSY Left 07/28/2023   Procedure: BREAST LUMPECTOMY,RADIO FREQ LOCALIZER,AXILLARY SENTINEL LYMPH NODE BIOPSY;  Surgeon: Sung Amabile, DO;  Location: [facility];  Service: General;  Laterality:  Left;   CESAREAN SECTION  01/03/2007   CESAREAN SECTION WITH BILATERAL TUBAL LIGATION N/A 03/26/2016   Procedure: CESAREAN SECTION WITH BILATERAL TUBAL LIGATION;  Surgeon: Conard Novak, MD;  Location: [facility];  Service: Obstetrics;  Laterality: N/A;   COLPOSCOPY     IUD REMOVAL     PORTACATH PLACEMENT N/A 02/24/2023   Procedure: INSERTION PORT-A-CATH;  Surgeon: Sung Amabile, DO;  Location: [facility];  Service: General;  Laterality: N/A;    Social History   Socioeconomic History   Marital status: Married    Spouse name: Verdon Cummins   Number of children: 3   Years of education: Not on file   Highest education level: Not on file  Occupational History   Not on file  Tobacco Use   Smoking status: Former    Current packs/day: 0.00    Types: Cigarettes    Quit date: 2022    Years since quitting: 3.1   Smokeless tobacco: Never  Vaping Use   Vaping status: Never Used  Substance and Sexual Activity   Alcohol use: Yes    Comment: rarely   Drug use: No   Sexual activity: Yes    Birth control/protection: Surgical    Comment: tubal ligation  Other Topics Concern   Not on file  Social History Narrative   Not on file  Social Drivers of Corporate investment banker Strain: Low Risk  (04/05/2023)   Overall Financial Resource Strain (CARDIA)    Difficulty of Paying Living Expenses: Not very hard  Food Insecurity: No Food Insecurity (04/05/2023)   Hunger Vital Sign    Worried About Running Out of Food in the Last Year: Never true    Ran Out of Food in the Last Year: Never true  Transportation Needs: No Transportation Needs (02/17/2023)   PRAPARE - Administrator, Civil Service (Medical): No    Lack of Transportation (Non-Medical): No  Physical Activity: Inactive (04/05/2023)   Exercise Vital Sign    Days of Exercise per Week: 0 days    Minutes of Exercise per Session: 0 min  Stress: Stress Concern Present (04/05/2023)   [employer] of Occupational Health -  Occupational Stress Questionnaire    Feeling of Stress : To some extent  Social Connections: Socially Integrated (04/05/2023)   Social Connection and Isolation Panel [NHANES]    Frequency of Communication with Friends and Family: Three times a week    Frequency of Social Gatherings with Friends and Family: Once a week    Attends Religious Services: 1 to 4 times per year    Active Member of [employer] or Organizations: Yes    Attends Banker Meetings: 1 to 4 times per year    Marital Status: Married  Catering manager Violence: Not At Risk (02/17/2023)   Humiliation, Afraid, Rape, and Kick questionnaire    Fear of Current or Ex-Partner: No    Emotionally Abused: No    Physically Abused: No    Sexually Abused: No    Family History  Problem Relation Age of Onset   Hypercholesterolemia Mother    Diabetes Mellitus II Father    Hypertension Father    Breast cancer Paternal Aunt 4   Breast cancer Paternal Aunt 43   Breast cancer Paternal Grandmother        60s   Throat cancer Paternal Grandfather    Heart attack Paternal Grandfather     No Known Allergies  Outpatient Medications Prior to Visit  Medication Sig   ALPRAZolam (XANAX) 0.5 MG tablet USE AS DIRECTED TAKE HALF TAB TWICE DAILY FOR ANXIETY   aspirin-acetaminophen-caffeine (EXCEDRIN MIGRAINE) 250-250-65 MG tablet Take 1 tablet by mouth every 6 (six) hours as needed for headache.   clobetasol ointment (TEMOVATE) 0.05 % Apply to affected area once wkly as maintenance   diclofenac Sodium (VOLTAREN) 1 % GEL Apply 2 g topically 2 (two) times daily as needed. To right elbow   escitalopram (LEXAPRO) 10 MG tablet Take 1 tablet (10 mg total) by mouth daily.   lidocaine-prilocaine (EMLA) cream Apply 1 Application topically as needed.   metoprolol succinate (TOPROL-XL) 25 MG 24 hr tablet TAKE 1 TABLET BY MOUTH ONCE A DAY **MAKE FOLLOW UP APPT WITH DR. KHAN FOR REFILLS**   ondansetron (ZOFRAN) 4 MG tablet Take 1 tablet (4 mg total)  by mouth every 8 (eight) hours as needed.   [DISCONTINUED] amphetamine-dextroamphetamine (ADDERALL) 20 MG tablet Take 1 tablet (20 mg total) by mouth 2 (two) times daily for 18 days.   Facility-Administered Medications Prior to Visit  Medication Dose Route Frequency Provider   heparin lock flush 100 unit/mL  500 Units Intravenous Once Michaelyn Barter, MD   heparin lock flush 100 unit/mL  500 Units Intravenous Once Michaelyn Barter, MD   heparin lock flush 100 unit/mL  500 Units Intracatheter Once PRN Michaelyn Barter,  MD   sodium chloride flush (NS) 0.9 % injection 10 mL  10 mL Intravenous PRN Michaelyn Barter, MD    Review of Systems  Constitutional: Negative.   HENT: Negative.    Eyes: Negative.   Respiratory: Negative.    Cardiovascular: Negative.   Gastrointestinal: Negative.   Genitourinary: Negative.   Skin: Negative.   Neurological: Negative.   Endo/Heme/Allergies: Negative.        Objective:   BP (!) 142/76   Pulse (!) 107   Temp 98.2 F (36.8 C) (Tympanic)   Ht 5\' 6"  (1.676 m)   Wt 205 lb (93 kg)   SpO2 97%   BMI 33.09 kg/m   Vitals:   02/10/24 1516  BP: (!) 142/76  Pulse: (!) 107  Temp: 98.2 F (36.8 C)  Height: 5\' 6"  (1.676 m)  Weight: 205 lb (93 kg)  SpO2: 97%  TempSrc: Tympanic  BMI (Calculated): 33.1    Physical Exam Vitals reviewed.  Constitutional:      General: She is not in acute distress. HENT:     Head: Normocephalic.     Nose: Nose normal.     Mouth/Throat:     Mouth: Mucous membranes are moist.  Eyes:     Extraocular Movements: Extraocular movements intact.     Pupils: Pupils are equal, round, and reactive to light.  Cardiovascular:     Rate and Rhythm: Normal rate and regular rhythm.     Heart sounds: No murmur heard. Pulmonary:     Effort: Pulmonary effort is normal.     Breath sounds: No rhonchi or rales.  Abdominal:     General: Abdomen is flat.     Palpations: There is no hepatomegaly, splenomegaly or mass.   Musculoskeletal:        General: Normal range of motion.     Cervical back: Normal range of motion. No tenderness.  Skin:    General: Skin is warm and dry.     Comments: Alopecia  Neurological:     General: No focal deficit present.     Mental Status: She is alert and oriented to person, place, and time.     Cranial Nerves: No cranial nerve deficit.     Motor: No weakness.  Psychiatric:        Mood and Affect: Mood normal.        Behavior: Behavior normal.      No results found for any visits on 02/10/24.  Recent Results (from the past 2160 hours)  Lipase, blood     Status: None   Collection Time: 11/13/23  7:49 AM  Result Value Ref Range   Lipase 26 11 - 51 U/L    Comment: Performed at [facility], [address] [place], [place] [address]  Comprehensive metabolic panel     Status: Abnormal   Collection Time: 11/13/23  7:49 AM  Result Value Ref Range   Sodium 140 135 - 145 mmol/L   Potassium 3.4 (L) 3.5 - 5.1 mmol/L   Chloride 106 98 - 111 mmol/L   CO2 24 22 - 32 mmol/L   Glucose, Bld 145 (H) 70 - 99 mg/dL    Comment: Glucose reference range applies only to samples taken after fasting for at least 8 hours.   BUN 28 (H) 6 - 20 mg/dL   Creatinine, Ser 6.04 0.44 - 1.00 mg/dL   Calcium 9.3 8.9 - 54.0 mg/dL   Total Protein 7.1 6.5 - 8.1 g/dL   Albumin 3.7  3.5 - 5.0 g/dL   AST 18 15 - 41 U/L   ALT 25 0 - 44 U/L   Alkaline Phosphatase 88 38 - 126 U/L   Total Bilirubin 0.5 <1.2 mg/dL   GFR, Estimated >16 >10 mL/min    Comment: (NOTE) Calculated using the CKD-EPI Creatinine Equation (2021)    Anion gap 10 5 - 15    Comment: Performed at [facility], [address] [place], [place] [address]  CBC     Status: Abnormal   Collection Time: 11/13/23  7:49 AM  Result Value Ref Range   WBC 7.8 4.0 - 10.5 K/uL   RBC 4.06 3.87 - 5.11 MIL/uL   Hemoglobin 11.2 (L) 12.0 - 15.0 g/dL   HCT 40.9 (L) 81.1 - 91.4 %   MCV 82.5 80.0 - 100.0 fL   MCH 27.6 26.0 -  34.0 pg   MCHC 33.4 30.0 - 36.0 g/dL   RDW 78.2 95.6 - 21.3 %   Platelets 302 150 - 400 K/uL   nRBC 0.0 0.0 - 0.2 %    Comment: Performed at [facility], [address] [place], [place] [address]  CBC with Differential/Platelet     Status: Abnormal   Collection Time: 11/13/23  7:49 AM  Result Value Ref Range   WBC 7.8 4.0 - 10.5 K/uL   RBC 4.07 3.87 - 5.11 MIL/uL   Hemoglobin 11.2 (L) 12.0 - 15.0 g/dL   HCT 84.6 (L) 96.2 - 95.2 %   MCV 83.8 80.0 - 100.0 fL   MCH 27.5 26.0 - 34.0 pg   MCHC 32.8 30.0 - 36.0 g/dL   RDW 84.1 32.4 - 40.1 %   Platelets 297 150 - 400 K/uL   nRBC 0.0 0.0 - 0.2 %   Neutrophils Relative % 64 %   Neutro Abs 5.1 1.7 - 7.7 K/uL   Lymphocytes Relative 26 %   Lymphs Abs 2.0 0.7 - 4.0 K/uL   Monocytes Relative 7 %   Monocytes Absolute 0.5 0.1 - 1.0 K/uL   Eosinophils Relative 2 %   Eosinophils Absolute 0.2 0.0 - 0.5 K/uL   Basophils Relative 0 %   Basophils Absolute 0.0 0.0 - 0.1 K/uL   Immature Granulocytes 1 %   Abs Immature Granulocytes 0.04 0.00 - 0.07 K/uL    Comment: Performed at [facility], [address] [place], [place] [address]  Urinalysis, Routine w reflex microscopic -Urine, Random     Status: Abnormal   Collection Time: 11/13/23 10:30 AM  Result Value Ref Range   Color, Urine YELLOW (A) YELLOW   APPearance CLEAR (A) CLEAR   Specific Gravity, Urine >1.046 (H) 1.005 - 1.030   pH 5.0 5.0 - 8.0   Glucose, UA NEGATIVE NEGATIVE mg/dL   Hgb urine dipstick SMALL (A) NEGATIVE   Bilirubin Urine NEGATIVE NEGATIVE   Ketones, ur NEGATIVE NEGATIVE mg/dL   Protein, ur NEGATIVE NEGATIVE mg/dL   Nitrite NEGATIVE NEGATIVE   Leukocytes,Ua NEGATIVE NEGATIVE   RBC / HPF 6-10 0 - 5 RBC/hpf   WBC, UA 0 0 - 5 WBC/hpf   Bacteria, UA NONE SEEN NONE SEEN   Squamous Epithelial / HPF 0-5 0 - 5 /HPF   Mucus PRESENT     Comment: Performed at [facility], [address] [place], [place] [address]  POC urine preg, ED     Status:  None   Collection Time: 11/13/23 10:33 AM  Result Value Ref Range   Preg  Test, Ur NEGATIVE NEGATIVE    Comment:        THE SENSITIVITY OF THIS METHODOLOGY IS >24 mIU/mL   CBC with Differential/Platelet     Status: Abnormal   Collection Time: 12/13/23 10:29 AM  Result Value Ref Range   WBC 5.8 4.0 - 10.5 K/uL   RBC 4.23 3.87 - 5.11 MIL/uL   Hemoglobin 11.5 (L) 12.0 - 15.0 g/dL   HCT 96.0 (L) 45.4 - 09.8 %   MCV 83.9 80.0 - 100.0 fL   MCH 27.2 26.0 - 34.0 pg   MCHC 32.4 30.0 - 36.0 g/dL   RDW 11.9 14.7 - 82.9 %   Platelets 258 150 - 400 K/uL   nRBC 0.0 0.0 - 0.2 %   Neutrophils Relative % 66 %   Neutro Abs 3.8 1.7 - 7.7 K/uL   Lymphocytes Relative 25 %   Lymphs Abs 1.5 0.7 - 4.0 K/uL   Monocytes Relative 6 %   Monocytes Absolute 0.4 0.1 - 1.0 K/uL   Eosinophils Relative 2 %   Eosinophils Absolute 0.1 0.0 - 0.5 K/uL   Basophils Relative 0 %   Basophils Absolute 0.0 0.0 - 0.1 K/uL   Immature Granulocytes 1 %   Abs Immature Granulocytes 0.05 0.00 - 0.07 K/uL    Comment: Performed at [facility], [address] [place], [place] [address]  Comprehensive metabolic panel     Status: Abnormal   Collection Time: 12/13/23 10:29 AM  Result Value Ref Range   Sodium 139 135 - 145 mmol/L   Potassium 3.2 (L) 3.5 - 5.1 mmol/L   Chloride 105 98 - 111 mmol/L   CO2 24 22 - 32 mmol/L   Glucose, Bld 140 (H) 70 - 99 mg/dL    Comment: Glucose reference range applies only to samples taken after fasting for at least 8 hours.   BUN 19 6 - 20 mg/dL   Creatinine, Ser 0.86 0.44 - 1.00 mg/dL   Calcium 9.4 8.9 - 57.8 mg/dL   Total Protein 6.9 6.5 - 8.1 g/dL   Albumin 3.6 3.5 - 5.0 g/dL   AST 21 15 - 41 U/L   ALT 26 0 - 44 U/L   Alkaline Phosphatase 90 38 - 126 U/L   Total Bilirubin 0.7 0.0 - 1.2 mg/dL   GFR, Estimated >46 >96 mL/min    Comment: (NOTE) Calculated using the CKD-EPI Creatinine Equation (2021)    Anion gap 10 5 - 15    Comment: Performed at [facility], [address], [place], [place] [address]  Basic metabolic panel     Status: Abnormal   Collection Time: 01/13/24  8:57 PM  Result Value Ref Range   Sodium 139 135 - 145 mmol/L   Potassium 3.7 3.5 - 5.1 mmol/L   Chloride 104 98 - 111 mmol/L   CO2 24 22 - 32 mmol/L   Glucose, Bld 97 70 - 99 mg/dL    Comment: Glucose reference range applies only to samples taken after fasting for at least 8 hours.   BUN 22 (H) 6 - 20 mg/dL   Creatinine, Ser 4.13 0.44 - 1.00 mg/dL   Calcium 9.6 8.9 - 24.4 mg/dL   GFR, Estimated >01 >02 mL/min    Comment: (NOTE) Calculated using the CKD-EPI Creatinine Equation (2021)    Anion gap 11 5 - 15    Comment: Performed at [facility], [address] [place], [place] [address]  CBC  Status: None   Collection Time: 01/13/24  8:57 PM  Result Value Ref Range   WBC 8.8 4.0 - 10.5 K/uL   RBC 4.39 3.87 - 5.11 MIL/uL   Hemoglobin 12.2 12.0 - 15.0 g/dL   HCT 30.1 60.1 - 09.3 %   MCV 85.4 80.0 - 100.0 fL   MCH 27.8 26.0 - 34.0 pg   MCHC 32.5 30.0 - 36.0 g/dL   RDW 23.5 57.3 - 22.0 %   Platelets 327 150 - 400 K/uL   nRBC 0.0 0.0 - 0.2 %    Comment: Performed at [facility], [address], [place], [place] [address]  Troponin I (High Sensitivity)     Status: None   Collection Time: 01/13/24  8:57 PM  Result Value Ref Range   Troponin I (High Sensitivity) <2 <18 ng/L    Comment: (NOTE) Elevated high sensitivity troponin I (hsTnI) values and significant  changes across serial measurements may suggest ACS but many other  chronic and acute conditions are known to elevate hsTnI results.  Refer to the "Links" section for chest pain algorithms and additional  guidance. Performed at [facility], [address] [place], [place] [address]   Lipid panel     Status: Abnormal   Collection Time: 02/08/24  8:39 AM  Result Value Ref Range   Cholesterol, Total 246 (H) 100 - 199 mg/dL   Triglycerides 628 (H) 0 - 149 mg/dL   HDL 43 >31  mg/dL   VLDL Cholesterol Cal 39 5 - 40 mg/dL   LDL Chol Calc (NIH) 517 (H) 0 - 99 mg/dL   Chol/HDL Ratio 5.7 (H) 0.0 - 4.4 ratio    Comment:                                   T. Chol/HDL Ratio                                             Men  Women                               1/2 Avg.Risk  3.4    3.3                                   Avg.Risk  5.0    4.4                                2X Avg.Risk  9.6    7.1                                3X Avg.Risk 23.4   11.0       Assessment & Plan:  As per problem list. Stricter low calorie diet, low cholesterol and low fat diet and exercise as much as possible. Problem List Items Addressed This Visit       Other   Adult ADHD (attention deficit hyperactivity disorder)   Relevant Medications   amphetamine-dextroamphetamine (ADDERALL) 20 MG tablet   amphetamine-dextroamphetamine (ADDERALL) 20 MG tablet (Start  on 03/12/2024)   Other Visit Diagnoses       Obesity (BMI 30-39.9)    -  Primary   Relevant Medications   amphetamine-dextroamphetamine (ADDERALL) 20 MG tablet   amphetamine-dextroamphetamine (ADDERALL) 20 MG tablet (Start on 03/12/2024)   Other Relevant Orders   Hemoglobin A1c       Return in about 2 months (around 04/09/2024) for Weight management.   Total time spent: 20 minutes  Luna Fuse, MD  02/10/2024   This document may have been prepared by [facility] Voice Recognition software and as such may include unintentional dictation errors.

## 2024-02-11 LAB — HEMOGLOBIN A1C
Est. average glucose Bld gHb Est-mCnc: 111 mg/dL
Hgb A1c MFr Bld: 5.5 % (ref 4.8–5.6)

## 2024-02-13 ENCOUNTER — Encounter: Payer: Self-pay | Admitting: Internal Medicine

## 2024-02-13 NOTE — Progress Notes (Signed)
 Informed via Mychart message

## 2024-02-14 ENCOUNTER — Ambulatory Visit: Payer: Managed Care, Other (non HMO)

## 2024-02-14 ENCOUNTER — Ambulatory Visit
Admission: RE | Admit: 2024-02-14 | Discharge: 2024-02-14 | Disposition: A | Discharge status: Home / Self Care | Payer: Managed Care, Other (non HMO) | Source: Ambulatory Visit | Attending: Internal Medicine | Admitting: Internal Medicine

## 2024-02-14 DIAGNOSIS — Z171 Estrogen receptor negative status [ER-]: Secondary | ICD-10-CM | POA: Insufficient documentation

## 2024-02-14 DIAGNOSIS — C50912 Malignant neoplasm of unspecified site of left female breast: Secondary | ICD-10-CM | POA: Insufficient documentation

## 2024-02-21 ENCOUNTER — Inpatient Hospital Stay: Payer: Managed Care, Other (non HMO) | Attending: Internal Medicine

## 2024-02-21 VITALS — BP 148/91 | HR 94 | Temp 96.1°F | Resp 20 | Wt 206.8 lb

## 2024-02-21 DIAGNOSIS — Z171 Estrogen receptor negative status [ER-]: Secondary | ICD-10-CM | POA: Insufficient documentation

## 2024-02-21 DIAGNOSIS — C50912 Malignant neoplasm of unspecified site of left female breast: Secondary | ICD-10-CM

## 2024-02-21 DIAGNOSIS — Z5112 Encounter for antineoplastic immunotherapy: Secondary | ICD-10-CM | POA: Insufficient documentation

## 2024-02-21 DIAGNOSIS — C50412 Malignant neoplasm of upper-outer quadrant of left female breast: Secondary | ICD-10-CM | POA: Diagnosis present

## 2024-02-21 DIAGNOSIS — Z79620 Long term (current) use of immunosuppressive biologic: Secondary | ICD-10-CM | POA: Insufficient documentation

## 2024-02-21 MED ORDER — SODIUM CHLORIDE 0.9 % IV SOLN
Freq: Once | INTRAVENOUS | Status: AC
  Filled 2024-02-21: qty 250

## 2024-02-21 MED ORDER — SODIUM CHLORIDE 0.9 % IV SOLN
420.0000 mg | Freq: Once | INTRAVENOUS | Status: AC
  Administered 2024-02-21: 420 mg via INTRAVENOUS
  Filled 2024-02-21: qty 14

## 2024-02-21 MED ORDER — HEPARIN SOD (PORK) LOCK FLUSH 100 UNIT/ML IV SOLN
500.0000 [IU] | Freq: Once | INTRAVENOUS | Status: AC | PRN
  Administered 2024-02-21: 500 [IU]
  Filled 2024-02-21: qty 5

## 2024-02-21 MED ORDER — DIPHENHYDRAMINE HCL 25 MG PO CAPS
50.0000 mg | ORAL_CAPSULE | Freq: Once | ORAL | Status: AC
  Administered 2024-02-21: 50 mg via ORAL
  Filled 2024-02-21: qty 2

## 2024-02-21 MED ORDER — TRASTUZUMAB-ANNS CHEMO 150 MG IV SOLR
6.0000 mg/kg | Freq: Once | INTRAVENOUS | Status: AC
  Administered 2024-02-21: 525 mg via INTRAVENOUS
  Filled 2024-02-21: qty 25

## 2024-02-21 MED ORDER — ACETAMINOPHEN 325 MG PO TABS
650.0000 mg | ORAL_TABLET | Freq: Once | ORAL | Status: AC
  Administered 2024-02-21: 650 mg via ORAL
  Filled 2024-02-21: qty 2

## 2024-02-21 NOTE — Patient Instructions (Signed)

## 2024-02-23 ENCOUNTER — Ambulatory Visit
Admission: RE | Admit: 2024-02-23 | Discharge: 2024-02-23 | Disposition: A | Discharge status: Home / Self Care | Payer: Managed Care, Other (non HMO) | Source: Ambulatory Visit | Attending: Internal Medicine | Admitting: Internal Medicine

## 2024-02-23 DIAGNOSIS — C50912 Malignant neoplasm of unspecified site of left female breast: Secondary | ICD-10-CM | POA: Insufficient documentation

## 2024-02-23 DIAGNOSIS — Z79899 Other long term (current) drug therapy: Secondary | ICD-10-CM | POA: Diagnosis not present

## 2024-02-23 DIAGNOSIS — Z171 Estrogen receptor negative status [ER-]: Secondary | ICD-10-CM | POA: Insufficient documentation

## 2024-02-23 LAB — ECHOCARDIOGRAM COMPLETE
AR max vel: 2.84 cm2
AV Area VTI: 3.44 cm2
AV Area mean vel: 2.71 cm2
AV Mean grad: 4 mmHg
AV Peak grad: 6.2 mmHg
Ao pk vel: 1.24 m/s
Area-P 1/2: 4.12 cm2
MV VTI: 3.29 cm2
S' Lateral: 2.8 cm

## 2024-02-23 NOTE — Progress Notes (Signed)
*  PRELIMINARY RESULTS* Echocardiogram 2D Echocardiogram has been performed.  Cristela Blue 02/23/2024, 10:58 AM

## 2024-02-29 ENCOUNTER — Other Ambulatory Visit: Payer: Self-pay | Admitting: Internal Medicine

## 2024-02-29 DIAGNOSIS — F909 Attention-deficit hyperactivity disorder, unspecified type: Secondary | ICD-10-CM

## 2024-03-01 ENCOUNTER — Other Ambulatory Visit: Payer: Self-pay

## 2024-03-02 MED ORDER — AMPHETAMINE-DEXTROAMPHETAMINE 20 MG PO TABS: 20.0000 mg | ORAL_TABLET | Freq: Two times a day (BID) | ORAL | 0 refills | Status: DC

## 2024-03-06 ENCOUNTER — Ambulatory Visit: Payer: Managed Care, Other (non HMO) | Admitting: Oncology

## 2024-03-06 ENCOUNTER — Ambulatory Visit: Payer: Managed Care, Other (non HMO)

## 2024-03-06 ENCOUNTER — Other Ambulatory Visit: Payer: Managed Care, Other (non HMO)

## 2024-03-13 ENCOUNTER — Inpatient Hospital Stay (HOSPITAL_BASED_OUTPATIENT_CLINIC_OR_DEPARTMENT_OTHER): Payer: Managed Care, Other (non HMO) | Admitting: Oncology

## 2024-03-13 ENCOUNTER — Inpatient Hospital Stay: Payer: Managed Care, Other (non HMO)

## 2024-03-13 ENCOUNTER — Encounter: Payer: Self-pay | Admitting: Oncology

## 2024-03-13 ENCOUNTER — Inpatient Hospital Stay: Payer: Managed Care, Other (non HMO) | Attending: Internal Medicine

## 2024-03-13 VITALS — BP 125/77 | HR 82 | Temp 97.7°F | Resp 19

## 2024-03-13 VITALS — BP 117/87 | HR 90 | Temp 97.4°F | Resp 18 | Ht 66.0 in | Wt 204.2 lb

## 2024-03-13 DIAGNOSIS — Z5112 Encounter for antineoplastic immunotherapy: Secondary | ICD-10-CM

## 2024-03-13 DIAGNOSIS — C50912 Malignant neoplasm of unspecified site of left female breast: Secondary | ICD-10-CM | POA: Diagnosis not present

## 2024-03-13 DIAGNOSIS — T451X5A Adverse effect of antineoplastic and immunosuppressive drugs, initial encounter: Secondary | ICD-10-CM | POA: Diagnosis not present

## 2024-03-13 DIAGNOSIS — Z79899 Other long term (current) drug therapy: Secondary | ICD-10-CM | POA: Diagnosis not present

## 2024-03-13 DIAGNOSIS — G620 Drug-induced polyneuropathy: Secondary | ICD-10-CM | POA: Diagnosis not present

## 2024-03-13 DIAGNOSIS — C50412 Malignant neoplasm of upper-outer quadrant of left female breast: Secondary | ICD-10-CM | POA: Insufficient documentation

## 2024-03-13 DIAGNOSIS — Z171 Estrogen receptor negative status [ER-]: Secondary | ICD-10-CM

## 2024-03-13 DIAGNOSIS — Z79620 Long term (current) use of immunosuppressive biologic: Secondary | ICD-10-CM | POA: Diagnosis not present

## 2024-03-13 LAB — COMPREHENSIVE METABOLIC PANEL WITH GFR
ALT: 20 U/L (ref 0–44)
AST: 18 U/L (ref 15–41)
Albumin: 3.9 g/dL (ref 3.5–5.0)
Alkaline Phosphatase: 96 U/L (ref 38–126)
Anion gap: 7 (ref 5–15)
BUN: 18 mg/dL (ref 6–20)
CO2: 22 mmol/L (ref 22–32)
Calcium: 9.2 mg/dL (ref 8.9–10.3)
Chloride: 107 mmol/L (ref 98–111)
Creatinine, Ser: 0.66 mg/dL (ref 0.44–1.00)
GFR, Estimated: >60 mL/min (ref >60)
Glucose, Bld: 127 mg/dL — ABNORMAL HIGH (ref 70–99)
Potassium: 3.3 mmol/L — ABNORMAL LOW (ref 3.5–5.1)
Sodium: 136 mmol/L (ref 135–145)
Total Bilirubin: 0.8 mg/dL (ref 0.0–1.2)
Total Protein: 6.9 g/dL (ref 6.5–8.1)

## 2024-03-13 LAB — CBC WITH DIFFERENTIAL/PLATELET
Abs Immature Granulocytes: 0.03 10*3/uL (ref 0.00–0.07)
Basophils Absolute: 0 10*3/uL (ref 0.0–0.1)
Basophils Relative: 0 %
Eosinophils Absolute: 0.1 10*3/uL (ref 0.0–0.5)
Eosinophils Relative: 2 %
HCT: 34.7 % — ABNORMAL LOW (ref 36.0–46.0)
Hemoglobin: 11.4 g/dL — ABNORMAL LOW (ref 12.0–15.0)
Immature Granulocytes: 1 %
Lymphocytes Relative: 33 %
Lymphs Abs: 1.4 10*3/uL (ref 0.7–4.0)
MCH: 27.3 pg (ref 26.0–34.0)
MCHC: 32.9 g/dL (ref 30.0–36.0)
MCV: 83.2 fL (ref 80.0–100.0)
Monocytes Absolute: 0.2 10*3/uL (ref 0.1–1.0)
Monocytes Relative: 5 %
Neutro Abs: 2.6 10*3/uL (ref 1.7–7.7)
Neutrophils Relative %: 59 %
Platelets: 253 10*3/uL (ref 150–400)
RBC: 4.17 MIL/uL (ref 3.87–5.11)
RDW: 13.3 % (ref 11.5–15.5)
WBC: 4.3 10*3/uL (ref 4.0–10.5)
nRBC: 0 % (ref 0.0–0.2)

## 2024-03-13 MED ORDER — ACETAMINOPHEN 325 MG PO TABS
650.0000 mg | ORAL_TABLET | Freq: Once | ORAL | Status: AC
  Administered 2024-03-13: 650 mg via ORAL
  Filled 2024-03-13: qty 2

## 2024-03-13 MED ORDER — TRASTUZUMAB-ANNS CHEMO 150 MG IV SOLR
6.0000 mg/kg | Freq: Once | INTRAVENOUS | Status: AC
  Administered 2024-03-13: 525 mg via INTRAVENOUS
  Filled 2024-03-13: qty 25

## 2024-03-13 MED ORDER — DIPHENHYDRAMINE HCL 25 MG PO CAPS
50.0000 mg | ORAL_CAPSULE | Freq: Once | ORAL | Status: AC
Start: 2024-03-13 — End: 2024-03-13
  Administered 2024-03-13: 50 mg via ORAL
  Filled 2024-03-13: qty 2

## 2024-03-13 MED ORDER — SODIUM CHLORIDE 0.9 % IV SOLN
Freq: Once | INTRAVENOUS | Status: AC
  Filled 2024-03-13: qty 250

## 2024-03-13 MED ORDER — SODIUM CHLORIDE 0.9% FLUSH
10.0000 mL | Freq: Once | INTRAVENOUS | Status: AC
  Administered 2024-03-13: 10 mL via INTRAVENOUS
  Filled 2024-03-13: qty 10

## 2024-03-13 MED ORDER — HEPARIN SOD (PORK) LOCK FLUSH 100 UNIT/ML IV SOLN
500.0000 [IU] | Freq: Once | INTRAVENOUS | Status: AC | PRN
Start: 2024-03-13 — End: 2024-03-13
  Administered 2024-03-13: 500 [IU]
  Filled 2024-03-13: qty 5

## 2024-03-13 MED ORDER — SODIUM CHLORIDE 0.9 % IV SOLN
420.0000 mg | Freq: Once | INTRAVENOUS | Status: AC
  Administered 2024-03-13: 420 mg via INTRAVENOUS
  Filled 2024-03-13: qty 14

## 2024-03-13 NOTE — Progress Notes (Signed)
 Hematology/Oncology Consult note 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 There is a suspicious 24 mm mass at the site of palpable concern which is concerning for malignancy. It demonstrates 3 cm of associated calcifications. Recommend ultrasound-guided biopsy for definitive characterization with attention on post marker placement mammogram to assess for extent of calcifications in relation to the biopsy marking clip. 2. There is an indeterminate LEFT axillary lymph node. Recommend ultrasound-guided biopsy for definitive characterization. 3. No mammographic evidence of malignancy in the RIGHT breast.   02/10/2023 Pathology Results   DIAGNOSIS: A.  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  Size of invasive carcinoma: 1.7 cm in linear length in this sample Histologic grade of invasive carcinoma: Grade 3 Glandular/tubular differentiation score: 3 Nuclear pleomorphism score: 3 Mitotic rate score: 2 Total score: 8 Ductal carcinoma in situ: Present, high-grade Lymphovascular invasion: Indeterminate See comment.  B.  LYMPH NODE, LEFT AXILLA; STEREOTACTIC CORE BIOPSY: -  INVOLVED BY INVASIVE MAMMARY CARCINOMA, NO SPECIAL TYPE.   Estrogen Receptor (ER) Status: NEGATIVE (LESS THAN 1%)         Internal control cells positive Progesterone Receptor (PgR) Status: Negative HER2 (by immunohistochemistry): Positive Ki-67: Not performed    02/17/2023 Cancer Staging   Staging form: Breast, AJCC 8th Edition - Clinical: Stage IIB (cT2, cN1, cM0, G3, ER-, PR-, HER2+) - Signed by Michaelyn Barter, MD on 02/17/2023 Histologic grading system: 3 grade system   03/07/2023 - 06/21/2023 Chemotherapy   Patient is on Treatment Plan : BREAST  Docetaxel + Carboplatin + Trastuzumab + Pertuzumab  (TCHP) q21d       Genetic Testing   Negative genetic testing. No pathogenic variants identified on the [facility] Multi-Cancer+RNA panel. The report date is 03/01/2023.  The Multi-Cancer + RNA Panel offered by [facility] includes sequencing and/or deletion/duplication analysis of the following 70 genes:  AIP*, ALK, APC*, ATM*, AXIN2*, BAP1*, BARD1*, BLM*, BMPR1A*, BRCA1*, BRCA2*, BRIP1*, CDC73*, CDH1*, CDK4, CDKN1B*, CDKN2A, CHEK2*, CTNNA1*, DICER1*, EPCAM, EGFR, FH*, FLCN*, GREM1, HOXB13, KIT, LZTR1, MAX*, MBD4, MEN1*, MET, MITF, MLH1*, MSH2*, MSH3*, MSH6*, MUTYH*, NF1*, NF2*, NTHL1*, PALB2*, PDGFRA, PMS2*, POLD1*, POLE*, POT1*, PRKAR1A*, PTCH1*, PTEN*, RAD51C*, RAD51D*, RB1*, RET, SDHA*, SDHAF2*, SDHB*, SDHC*, SDHD*, SMAD4*, SMARCA4*, SMARCB1*, SMARCE1*, STK11*, SUFU*, TMEM127*, TP53*, TSC1*, TSC2*, VHL*. RNA analysis is performed for * genes.   08/30/2023 -  Chemotherapy   Patient is on Treatment Plan : BREAST Trastuzumab  + Pertuzumab q21d x 13 cycles        Interval history-patient is tolerating Herceptin and Perjeta well so far and denies any symptoms of skin rash or diarrhea.  ECOG PS- 0 Pain scale- 0   Review of systems- Review of Systems  Constitutional:  Negative for chills, fever, malaise/fatigue and weight loss.  HENT:  Negative for congestion, ear discharge and nosebleeds.   Eyes:   Negative for blurred vision.  Respiratory:  Negative for cough, hemoptysis, sputum production, shortness of breath and wheezing.   Cardiovascular:  Negative for chest pain, palpitations, orthopnea and claudication.  Gastrointestinal:  Negative for abdominal pain, blood in stool, constipation, diarrhea, heartburn, melena, nausea and vomiting.  Genitourinary:  Negative for dysuria, flank pain, frequency, hematuria and urgency.  Musculoskeletal:  Negative for back pain, joint pain and myalgias.  Skin:  Negative for rash.  Neurological:  Negative for dizziness, tingling, focal weakness, seizures, weakness and headaches.  Endo/Heme/Allergies:  Does not bruise/bleed easily.  Psychiatric/Behavioral:  Negative for depression and suicidal ideas. The patient does not have insomnia.       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    Family history of breast cancer 2015   IBIS=18.8%   Family history of ovarian cancer    Heart palpitations    Herpes genitalis    Hypomagnesemia    Leg swelling    Leukocytosis    Lichen sclerosus    Mixed hyperlipidemia    PAC (premature atrial contraction)    PONV (postoperative nausea and vomiting)    nausea   PVC's (premature ventricular contractions)    Tachycardia, unspecified      Past Surgical History:  Procedure Laterality Date   BREAST BIOPSY Left 02/10/2023   invasive mammary carcinoma   BREAST BIOPSY Left 02/10/2023   [place] Core Hydromark clip path pending   BREAST BIOPSY Left 02/10/2023   [place] LT BREAST BX W LOC DEV 1ST LESION IMG BX SPEC US GUIDE  02/10/2023 [facility]   BREAST BIOPSY Left 06/30/2023   [place] LT RADIO FREQUENCY TAG EA ADD LESION LOC US GUIDE 06/30/2023 [facility]   BREAST LUMPECTOMY,RADIO FREQ LOCALIZER,AXILLARY SENTINEL LYMPH NODE BIOPSY Left 07/28/2023   Procedure: BREAST LUMPECTOMY,RADIO FREQ LOCALIZER,AXILLARY SENTINEL LYMPH NODE BIOPSY;  Surgeon: Sung Amabile, DO;  Location: [facility];  Service: General;  Laterality: Left;   CESAREAN SECTION  01/03/2007   CESAREAN SECTION WITH BILATERAL TUBAL LIGATION N/A 03/26/2016   Procedure: CESAREAN SECTION WITH BILATERAL TUBAL LIGATION;  Surgeon: Conard Novak, MD;  Location: [facility];  Service: Obstetrics;  Laterality: N/A;   COLPOSCOPY     IUD REMOVAL     PORTACATH PLACEMENT N/A 02/24/2023   Procedure: INSERTION PORT-A-CATH;  Surgeon: Sung Amabile, DO;  Location: [facility];  Service: General;  Laterality: N/A;    Social History   Socioeconomic History   Marital status: Married    Spouse name: Verdon Cummins   Number of children: 3   Years of education: Not on file   Highest education level: Not on file  Occupational History   Not on file  Tobacco Use   Smoking status: Former    Current packs/day: 0.00    Types: Cigarettes    Quit date: 2022    Years since quitting: 3.2   Smokeless tobacco: Never  Vaping Use   Vaping status: Never Used  Substance and Sexual Activity  Alcohol use: Yes    Comment: rarely   Drug use: No   Sexual activity: Yes    Birth control/protection: Surgical    Comment: tubal ligation  Other Topics Concern   Not on file  Social History Narrative   Not on file   Social Drivers of Health   Financial Resource Strain: Low Risk  (04/05/2023)   Overall Financial Resource Strain (CARDIA)    Difficulty of Paying Living Expenses: Not very hard  Food Insecurity: No Food Insecurity (04/05/2023)   Hunger Vital Sign    Worried About Running Out of Food in the Last Year: Never true    Ran Out of Food in the Last Year: Never true   Transportation Needs: No Transportation Needs (02/17/2023)   PRAPARE - Administrator, Civil Service (Medical): No    Lack of Transportation (Non-Medical): No  Physical Activity: Inactive (04/05/2023)   Exercise Vital Sign    Days of Exercise per Week: 0 days    Minutes of Exercise per Session: 0 min  Stress: Stress Concern Present (04/05/2023)   [employer] of Occupational Health - Occupational Stress Questionnaire    Feeling of Stress : To some extent  Social Connections: Socially Integrated (04/05/2023)   Social Connection and Isolation Panel [NHANES]    Frequency of Communication with Friends and Family: Three times a week    Frequency of Social Gatherings with Friends and Family: Once a week    Attends Religious Services: 1 to 4 times per year    Active Member of [employer] or Organizations: Yes    Attends Banker Meetings: 1 to 4 times per year    Marital Status: Married  Catering manager Violence: Not At Risk (02/17/2023)   Humiliation, Afraid, Rape, and Kick questionnaire    Fear of Current or Ex-Partner: No    Emotionally Abused: No    Physically Abused: No    Sexually Abused: No    Family History  Problem Relation Age of Onset   Hypercholesterolemia Mother    Diabetes Mellitus II Father    Hypertension Father    Breast cancer Paternal Aunt 27   Breast cancer Paternal Aunt 16   Breast cancer Paternal Grandmother        60s   Throat cancer Paternal Grandfather    Heart attack Paternal Grandfather      Current Outpatient Medications:    ALPRAZolam (XANAX) 0.5 MG tablet, USE AS DIRECTED TAKE HALF TAB TWICE DAILY FOR ANXIETY, Disp: 15 tablet, Rfl: 0   amphetamine-dextroamphetamine (ADDERALL) 20 MG tablet, Take 1 tablet (20 mg total) by mouth 2 (two) times daily., Disp: 60 tablet, Rfl: 0   amphetamine-dextroamphetamine (ADDERALL) 20 MG tablet, Take 1 tablet (20 mg total) by mouth 2 (two) times daily for 10 days., Disp: 20 tablet, Rfl: 0    aspirin-acetaminophen-caffeine (EXCEDRIN MIGRAINE) 250-250-65 MG tablet, Take 1 tablet by mouth every 6 (six) hours as needed for headache., Disp: , Rfl:    clobetasol ointment (TEMOVATE) 0.05 %, Apply to affected area once wkly as maintenance, Disp: 45 g, Rfl: 0   diclofenac Sodium (VOLTAREN) 1 % GEL, Apply 2 g topically 2 (two) times daily as needed. To right elbow, Disp: 2 g, Rfl: 0   escitalopram (LEXAPRO) 10 MG tablet, Take 1 tablet (10 mg total) by mouth daily., Disp: 90 tablet, Rfl: 1   lidocaine-prilocaine (EMLA) cream, Apply 1 Application topically as needed., Disp: 30 g, Rfl: 0   metoprolol succinate (TOPROL-XL) 25  MG 24 hr tablet, TAKE 1 TABLET BY MOUTH ONCE A DAY **MAKE FOLLOW UP APPT WITH DR. KHAN FOR REFILLS**, Disp: 90 tablet, Rfl: 0   ondansetron (ZOFRAN) 4 MG tablet, Take 1 tablet (4 mg total) by mouth every 8 (eight) hours as needed., Disp: 10 tablet, Rfl: 0 No current facility-administered medications for this visit.  Facility-Administered Medications Ordered in Other Visits:    heparin lock flush 100 unit/mL, 500 Units, Intravenous, Once, Michaelyn Barter, MD   heparin lock flush 100 unit/mL, 500 Units, Intravenous, Once, Michaelyn Barter, MD   heparin lock flush 100 unit/mL, 500 Units, Intracatheter, Once PRN, Michaelyn Barter, MD   sodium chloride flush (NS) 0.9 % injection 10 mL, 10 mL, Intravenous, PRN, Michaelyn Barter, MD, 10 mL at 11/01/23 1021  Physical exam:  Vitals:   03/13/24 0956  BP: 117/87  Pulse: 90  Resp: 18  Temp: (!) 97.4 F (36.3 C)  TempSrc: Tympanic  SpO2: 100%  Weight: 204 lb 3.2 oz (92.6 kg)  Height: 5\' 6"  (1.676 m)   Physical Exam Cardiovascular:     Rate and Rhythm: Normal rate and regular rhythm.     Heart sounds: Normal heart sounds.  Pulmonary:     Effort: Pulmonary effort is normal.     Breath sounds: Normal breath sounds.  Skin:    General: Skin is warm and dry.  Neurological:     Mental Status: She is alert and oriented to person,  place, and time.      I have personally reviewed labs listed below:    Latest Ref Rng & Units 03/13/2024    9:27 AM  CMP  Glucose 70 - 99 mg/dL 161   BUN 6 - 20 mg/dL 18   Creatinine 0.96 - 1.00 mg/dL 0.45   Sodium 409 - 811 mmol/L 136   Potassium 3.5 - 5.1 mmol/L 3.3   Chloride 98 - 111 mmol/L 107   CO2 22 - 32 mmol/L 22   Calcium 8.9 - 10.3 mg/dL 9.2   Total Protein 6.5 - 8.1 g/dL 6.9   Total Bilirubin 0.0 - 1.2 mg/dL 0.8   Alkaline Phos 38 - 126 U/L 96   AST 15 - 41 U/L 18   ALT 0 - 44 U/L 20       Latest Ref Rng & Units 03/13/2024    9:27 AM  CBC  WBC 4.0 - 10.5 K/uL 4.3   Hemoglobin 12.0 - 15.0 g/dL 91.4   Hematocrit 78.2 - 46.0 % 34.7   Platelets 150 - 400 K/uL 253    I have personally reviewed Radiology images listed below: No images are attached to the encounter.  ECHOCARDIOGRAM COMPLETE Result Date: 02/23/2024    ECHOCARDIOGRAM REPORT   Patient Name:   ANIJA BRICKNER Date of Exam: 02/23/2024 Medical Rec #:  [id number]        Height:       66.0 in Accession #:    [id number]       Weight:       206.8 lb Date of Birth:  March 08, 1986        BSA:          2.028 m Patient Age:    38 years         BP:           148/91 mmHg Patient Gender: F                HR:  94 bpm. Exam Location:  [facility] Procedure: 2D Echo, Cardiac Doppler and Color Doppler (Both Spectral and Color            Flow Doppler were utilized during procedure). Indications:     Chemo Z09  History:         Patient has prior history of Echocardiogram examinations, most                  recent 10/19/2023.  Sonographer:     Cristela Blue Referring Phys:  [id number] Michaelyn Barter Diagnosing Phys: Julien Nordmann MD IMPRESSIONS  1. Left ventricular ejection fraction, by estimation, is 60 to 65%. The left ventricle has normal function. The left ventricle has no regional wall motion abnormalities. Left ventricular diastolic parameters were normal.  2. Right ventricular systolic function is normal. The right ventricular size is  normal. There is normal pulmonary artery systolic pressure. The estimated right ventricular systolic pressure is 17.2 mmHg.  3. The mitral valve is normal in structure. No evidence of mitral valve regurgitation. No evidence of mitral stenosis.  4. The aortic valve is normal in structure. Aortic valve regurgitation is not visualized. No aortic stenosis is present.  5. The inferior vena cava is normal in size with greater than 50% respiratory variability, suggesting right atrial pressure of 3 mmHg. FINDINGS  Left Ventricle: Left ventricular ejection fraction, by estimation, is 60 to 65%. The left ventricle has normal function. The left ventricle has no regional wall motion abnormalities. Strain was performed and the global longitudinal strain is indeterminate. The left ventricular internal cavity size was normal in size. There is no left ventricular hypertrophy. Left ventricular diastolic parameters were normal. Right Ventricle: The right ventricular size is normal. No increase in right ventricular wall thickness. Right ventricular systolic function is normal. There is normal pulmonary artery systolic pressure. The tricuspid regurgitant velocity is 1.75 m/s, and  with an assumed right atrial pressure of 5 mmHg, the estimated right ventricular systolic pressure is 17.2 mmHg. Left Atrium: Left atrial size was normal in size. Right Atrium: Right atrial size was normal in size. Pericardium: There is no evidence of pericardial effusion. Mitral Valve: The mitral valve is normal in structure. No evidence of mitral valve regurgitation. No evidence of mitral valve stenosis. MV peak gradient, 4.4 mmHg. The mean mitral valve gradient is 2.0 mmHg. Tricuspid Valve: The tricuspid valve is normal in structure. Tricuspid valve regurgitation is not demonstrated. No evidence of tricuspid stenosis. Aortic Valve: The aortic valve is normal in structure. Aortic valve regurgitation is not visualized. No aortic stenosis is present. Aortic  valve mean gradient measures 4.0 mmHg. Aortic valve peak gradient measures 6.2 mmHg. Aortic valve area, by VTI measures 3.44 cm. Pulmonic Valve: The pulmonic valve was normal in structure. Pulmonic valve regurgitation is not visualized. No evidence of pulmonic stenosis. Aorta: The aortic root is normal in size and structure. Venous: The inferior vena cava is normal in size with greater than 50% respiratory variability, suggesting right atrial pressure of 3 mmHg. IAS/Shunts: No atrial level shunt detected by color flow Doppler. Additional Comments: 3D was performed not requiring image post processing on an independent workstation and was indeterminate.  LEFT VENTRICLE PLAX 2D LVIDd:         3.80 cm   Diastology LVIDs:         2.80 cm   LV e' medial:    10.60 cm/s LV PW:         1.10 cm   LV E/e'  medial:  9.0 LV IVS:        1.10 cm   LV e' lateral:   13.70 cm/s LVOT diam:     2.00 cm   LV E/e' lateral: 7.0 LV SV:         69 LV SV Index:   34 LVOT Area:     3.14 cm  RIGHT VENTRICLE RV Basal diam:  2.20 cm RV Mid diam:    1.70 cm LEFT ATRIUM             Index        RIGHT ATRIUM          Index LA diam:        2.20 cm 1.08 cm/m   RA Area:     6.59 cm LA Vol (A2C):   27.7 ml 13.66 ml/m  RA Volume:   9.39 ml  4.63 ml/m LA Vol (A4C):   40.0 ml 19.72 ml/m LA Biplane Vol: 34.4 ml 16.96 ml/m  AORTIC VALVE AV Area (Vmax):    2.84 cm AV Area (Vmean):   2.71 cm AV Area (VTI):     3.44 cm AV Vmax:           124.00 cm/s AV Vmean:          86.500 cm/s AV VTI:            0.201 m AV Peak Grad:      6.2 mmHg AV Mean Grad:      4.0 mmHg LVOT Vmax:         112.00 cm/s LVOT Vmean:        74.700 cm/s LVOT VTI:          0.220 m LVOT/AV VTI ratio: 1.09  AORTA Ao Root diam: 3.00 cm MITRAL VALVE               TRICUSPID VALVE MV Area (PHT): 4.12 cm    TR Peak grad:   12.2 mmHg MV Area VTI:   3.29 cm    TR Vmax:        175.00 cm/s MV Peak grad:  4.4 mmHg MV Mean grad:  2.0 mmHg    SHUNTS MV Vmax:       1.05 m/s    Systemic VTI:   0.22 m MV Vmean:      70.6 cm/s   Systemic Diam: 2.00 cm MV Decel Time: 184 msec MV E velocity: 95.40 cm/s MV A velocity: 70.20 cm/s MV E/A ratio:  1.36 Julien Nordmann MD Electronically signed by Julien Nordmann MD Signature Date/Time: 02/23/2024/12:25:54 PM    Final    MM DIAG BREAST TOMO BILATERAL Result Date: 02/14/2024 CLINICAL DATA:  First annual examination status post LEFT breast lumpectomy for invasive mammary carcinoma in August 2024, with negative margins. Negative sentinel lymph node biopsy for metastatic disease. She received neoadjuvant/adjuvant chemotherapy and adjuvant radiation therapy. EXAM: DIGITAL DIAGNOSTIC BILATERAL MAMMOGRAM WITH TOMOSYNTHESIS AND CAD TECHNIQUE: Bilateral digital diagnostic mammography and breast tomosynthesis was performed. The images were evaluated with computer-aided detection. COMPARISON:  Previous exam(s). ACR Breast Density Category b: There are scattered areas of fibroglandular density. FINDINGS: Interval parenchymal redistribution with associated asymmetry and architectural distortion in outer left breast middle depth and left axilla, consistent with post lumpectomy changes. There is also diffuse left breast skin thickening, consistent with post radiation changes. No new suspicious mass, calcification, or other findings are identified in bilateral breasts. IMPRESSION: 1. Interval posttreatment changes in the LEFT breast. 2. No mammographic  evidence of malignancy in BILATERAL breasts. RECOMMENDATION: BILATERAL diagnostic mammogram in 12 months to continue with post lumpectomy surveillance protocol. I have discussed the findings and recommendations with the patient. If applicable, a reminder letter will be sent to the patient regarding the next appointment. BI-RADS CATEGORY  2: Benign. Electronically Signed   By: Jacob Moores M.D.   On: 02/14/2024 11:10     Assessment and plan- Patient is a 38 y.o. female with history of stage IIb invasive mammary carcinoma of the  left breast grade 3 ER/PR negative HER2 positive status post neoadjuvant chemotherapy followed by surgery which showed pathological complete response and adjuvant radiation.  She is here for on treatment assessment prior to cycle 10 of adjuvant Herceptin and Perjeta.    Counts okay to proceed with cycle 10 of adjuvant Herceptin and Perjeta today.  She will be seen in 3 weeks by Dr. Michae Kava for cycle 11.  Plan would be to complete 13 adjuvant cycles.  Reviewed results of echocardiogram which showed normal EF.Marland Kitchen  She has ER/PR negative disease and therefore not endocrine therapy.  Patient found to have a nonspecific 3 mm right middle lobe pulmonary nodule which was nonspecific.  Defer decision to repeat CT chest and timing to Dr. Michae Kava during her next visit.  Anxiety: Continue Xanax as needed.  Chemo-induced peripheral neuropathy: Overall stable continue to monitor   Visit Diagnosis 1. Malignant neoplasm of left breast in female, estrogen receptor negative, unspecified site of breast (HCC)   2. Encounter for monoclonal antibody treatment for malignancy   3. Chemotherapy-induced peripheral neuropathy (HCC)      Dr. Owens Shark, MD, MPH [facility] [phone] 03/13/2024 10:24 AM

## 2024-03-28 ENCOUNTER — Other Ambulatory Visit: Payer: Self-pay | Admitting: Internal Medicine

## 2024-03-30 ENCOUNTER — Encounter: Payer: Self-pay | Admitting: Internal Medicine

## 2024-04-02 ENCOUNTER — Encounter: Payer: Self-pay | Admitting: Internal Medicine

## 2024-04-03 ENCOUNTER — Inpatient Hospital Stay: Payer: Managed Care, Other (non HMO)

## 2024-04-03 ENCOUNTER — Encounter: Payer: Self-pay | Admitting: Internal Medicine

## 2024-04-03 ENCOUNTER — Inpatient Hospital Stay (HOSPITAL_BASED_OUTPATIENT_CLINIC_OR_DEPARTMENT_OTHER): Payer: Managed Care, Other (non HMO) | Admitting: Internal Medicine

## 2024-04-03 ENCOUNTER — Other Ambulatory Visit: Payer: Self-pay | Admitting: *Deleted

## 2024-04-03 VITALS — BP 122/77 | HR 77 | Temp 97.3°F | Resp 18

## 2024-04-03 VITALS — BP 118/87 | HR 108 | Temp 97.6°F | Resp 19 | Wt 199.6 lb

## 2024-04-03 DIAGNOSIS — C50912 Malignant neoplasm of unspecified site of left female breast: Secondary | ICD-10-CM

## 2024-04-03 DIAGNOSIS — R911 Solitary pulmonary nodule: Secondary | ICD-10-CM | POA: Diagnosis not present

## 2024-04-03 DIAGNOSIS — Z171 Estrogen receptor negative status [ER-]: Secondary | ICD-10-CM

## 2024-04-03 DIAGNOSIS — Z5112 Encounter for antineoplastic immunotherapy: Secondary | ICD-10-CM | POA: Diagnosis not present

## 2024-04-03 MED ORDER — HEPARIN SOD (PORK) LOCK FLUSH 100 UNIT/ML IV SOLN
500.0000 [IU] | Freq: Once | INTRAVENOUS | Status: AC | PRN
Start: 2024-04-03 — End: 2024-04-03
  Administered 2024-04-03: 500 [IU]
  Filled 2024-04-03: qty 5

## 2024-04-03 MED ORDER — TRASTUZUMAB-ANNS CHEMO 150 MG IV SOLR
6.0000 mg/kg | Freq: Once | INTRAVENOUS | Status: AC
  Administered 2024-04-03: 525 mg via INTRAVENOUS
  Filled 2024-04-03: qty 25

## 2024-04-03 MED ORDER — SODIUM CHLORIDE 0.9 % IV SOLN
420.0000 mg | Freq: Once | INTRAVENOUS | Status: AC
  Administered 2024-04-03: 420 mg via INTRAVENOUS
  Filled 2024-04-03: qty 14

## 2024-04-03 MED ORDER — SODIUM CHLORIDE 0.9 % IV SOLN
Freq: Once | INTRAVENOUS | Status: AC
  Filled 2024-04-03: qty 250

## 2024-04-03 MED ORDER — ACETAMINOPHEN 325 MG PO TABS
650.0000 mg | ORAL_TABLET | Freq: Once | ORAL | Status: AC
  Administered 2024-04-03: 650 mg via ORAL

## 2024-04-03 MED ORDER — DIPHENHYDRAMINE HCL 25 MG PO CAPS
50.0000 mg | ORAL_CAPSULE | Freq: Once | ORAL | Status: AC
  Administered 2024-04-03: 50 mg via ORAL

## 2024-04-03 NOTE — Progress Notes (Signed)
 [facility] CONSULT NOTE  Patient Care Team: Shari Daughters, MD as PCP - General (Internal Medicine) Waverly Hageman, RN as Oncology Nurse Navigator Loreatha Rodney, MD as Consulting Physician (Oncology) Glenis Langdon, MD as Consulting Physician (Radiation Oncology)  CANCER STAGING   Cancer Staging  Breast cancer [facility] [place]) Staging form: Breast, AJCC 8th Edition - Clinical: Stage IIB (cT2, cN1, cM0, G3, ER-, PR-, HER2+) - Signed by Jimeka Balan, MD on 02/17/2023 Histologic grading system: 3 grade system   ASSESSMENT & PLAN:  Cristina Baker 38 y.o. 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  # Left breast invasive ductal cancer, ER/PR negative, HER2 positive, stage IIb -Self detected left breast mass.  Status post biopsy on 02/10/2023.  - Completed neoadjuvant TCHP x 6 cycle.    - s/p left breast lumpectomy with SLNB with Dr. Rosea Conch on 07/28/2023.  Had path CR.  0/3 lymph nodes negative for malignancy. -Completed adjuvant radiation from 08/29/2023 to 10/13/2023. - Echo from 02/23/2024 showed normal EF. - Proceed with cycle 11 of Herceptin  and Perjeta .  Plan is to complete total 13 adjuvant cycles of HP.  She will transition to Dr. Randy Buttery moving forward. - Diagnostic mammogram from March 2025 negative for malignancy.  Repeat in March 2026.  # Right ninth rib lesion -Questionable.  Last CT in May 2024 showed stable 7 mm lesion.  Case was previously discussed with Dr. Finas Huger from IR and lesion was too small to biopsy.  Repeat CT scan showed stable lesion likely bony island.  # Right middle lobe lung nodule -CT chest from 11/13/2023 showed small 3 mm right middle lobe nodule.  Likely benign but surveillance recommended by radiologist with her history of breast cancer. - Will schedule for CT chest with contrast in May 2025 which will be  about 6 months from the last scan to assess stability.  # Chemotherapy-induced neuropathy -Managed without medications  # Family history of breast cancer -Genetic testing was negative  # Anxiety -Xanax 0.25 mg twice daily as needed  # History of palpitations  -Reportedly, she had workup with cardiology which was negative.  It was deemed secondary to panic. -On metoprolol .  # Access-port placed by Dr. Rosea Conch.  Has Emla  cream  Orders Placed This Encounter  Procedures   CBC with Differential/Platelet    Standing Status:   Future    Expected Date:   04/24/2024    Expiration Date:   04/03/2025   Comprehensive metabolic panel with GFR    Standing Status:   Future    Expected Date:   04/24/2024    Expiration Date:   04/03/2025   RTC in 3 months for MD visit, labs, cycle 12 HP.  The total time spent in the appointment was 30 minutes encounter with patients including review of chart and various tests results, discussions about plan of care and coordination of care plan   All questions were answered. The patient knows to call the clinic with any problems, questions or concerns. No barriers to learning was detected.  Loreatha Rodney, MD 4/22/20251:41 PM   HISTORY OF PRESENTING ILLNESS:  Cristina Baker 38 y.o. 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  She denies any pain or tenderness in the breast.  Denies any discharge.  There is family history of breast cancer in paternal grandmother and 2 aunts.  She had a BRCA testing about 15 years ago which was negative.  She has completed family-planning.  Underwent tubal ligation.  She has 3 children 1 in 59s second in his teen and the youngest is 40 years old.    Interval history Patient was seen today as follow-up to start her maintenance Herceptin  and Perjeta . Overall she is feeling well.  Tolerating  treatment well.  Her neuropathy has significantly improved.  But does feel tired  I have reviewed her chart and materials related to her cancer extensively and collaborated history with the patient. Summary of oncologic history is as follows: Oncology History  Breast cancer (HCC)  01/26/2023 Initial Diagnosis   Patient felt palpable lump in left breast   02/10/2023 Mammogram     IMPRESSION: 1. There is a suspicious 24 mm mass at the site of palpable concern which is concerning for malignancy. It demonstrates 3 cm of associated calcifications. Recommend ultrasound-guided biopsy for definitive characterization with attention on post marker placement mammogram to assess for extent of calcifications in relation to the biopsy marking clip. 2. There is an indeterminate LEFT axillary lymph node. Recommend ultrasound-guided biopsy for definitive characterization. 3. No mammographic evidence of malignancy in the RIGHT breast.   02/10/2023 Pathology Results   DIAGNOSIS: A.  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  Size of invasive carcinoma: 1.7 cm in linear length in this sample Histologic grade of invasive carcinoma: Grade 3 Glandular/tubular differentiation score: 3 Nuclear pleomorphism score: 3 Mitotic rate score: 2 Total score: 8 Ductal carcinoma in situ: Present, high-grade Lymphovascular invasion: Indeterminate See comment.  B.  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   Estrogen Receptor (ER) Status: NEGATIVE (LESS THAN 1%)         Internal control cells positive Progesterone Receptor (PgR) Status: Negative HER2 (by immunohistochemistry): Positive Ki-67: Not performed    02/17/2023 Cancer Staging   Staging form: Breast, AJCC 8th Edition - Clinical: Stage IIB (cT2, cN1, cM0, G3, ER-, PR-, HER2+) - Signed by Dezire Turk, MD on 02/17/2023 Histologic  grading system: 3 grade system   03/07/2023 - 06/21/2023 Chemotherapy   Patient is on Treatment Plan : BREAST  Docetaxel  + Carboplatin  + Trastuzumab  + Pertuzumab   (TCHP) q21d       Genetic Testing   Negative genetic testing. No pathogenic variants identified on the [facility] Multi-Cancer+RNA panel. The report date is 03/01/2023.  The Multi-Cancer + RNA Panel offered by [facility] includes sequencing and/or deletion/duplication analysis of the following 70 genes:  AIP*, ALK, APC*, ATM*, AXIN2*, BAP1*, BARD1*, BLM*, BMPR1A*, BRCA1*, BRCA2*, BRIP1*, CDC73*, CDH1*, CDK4, CDKN1B*, CDKN2A, CHEK2*, CTNNA1*, DICER1*, EPCAM, EGFR, FH*, FLCN*, GREM1, HOXB13, KIT, LZTR1, MAX*, MBD4, MEN1*, MET, MITF, MLH1*, MSH2*, MSH3*, MSH6*, MUTYH*, NF1*, NF2*, NTHL1*, PALB2*, PDGFRA, PMS2*, POLD1*, POLE*, POT1*, PRKAR1A*, PTCH1*, PTEN*, RAD51C*, RAD51D*, RB1*, RET, SDHA*, SDHAF2*, SDHB*, SDHC*, SDHD*, SMAD4*, SMARCA4*, SMARCB1*, SMARCE1*, STK11*, SUFU*, TMEM127*, TP53*, TSC1*, TSC2*, VHL*. RNA analysis is performed for * genes.   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    Family history of breast cancer 2015   IBIS=18.8%   Family history of ovarian cancer    Heart palpitations    Herpes genitalis    Hypomagnesemia    Leg swelling    Leukocytosis    Lichen sclerosus    Mixed hyperlipidemia    PAC (premature atrial contraction)    PONV (postoperative nausea and vomiting)    nausea   PVC's (premature ventricular  contractions)    Tachycardia, unspecified     SURGICAL HISTORY: Past Surgical History:  Procedure Laterality Date   BREAST BIOPSY Left 02/10/2023   invasive mammary carcinoma   BREAST BIOPSY Left 02/10/2023   Us  Core Hydromark clip path pending   BREAST BIOPSY Left 02/10/2023   US  LT BREAST BX W LOC DEV 1ST LESION IMG BX SPEC US  GUIDE 02/10/2023 [facility]   BREAST BIOPSY Left 06/30/2023   US  LT RADIO FREQUENCY TAG EA ADD LESION LOC US  GUIDE 06/30/2023 [facility]   BREAST LUMPECTOMY,RADIO FREQ LOCALIZER,AXILLARY SENTINEL LYMPH NODE BIOPSY Left 07/28/2023   Procedure: BREAST LUMPECTOMY,RADIO FREQ LOCALIZER,AXILLARY SENTINEL LYMPH NODE BIOPSY;  Surgeon: Conrado Delay, DO;  Location: [facility];  Service: General;  Laterality: Left;   CESAREAN SECTION  01/03/2007   CESAREAN SECTION WITH BILATERAL TUBAL LIGATION N/A 03/26/2016   Procedure: CESAREAN SECTION WITH BILATERAL TUBAL LIGATION;  Surgeon: Kris Pester, MD;  Location: [facility];  Service: Obstetrics;  Laterality: N/A;   COLPOSCOPY     IUD REMOVAL     PORTACATH PLACEMENT N/A 02/24/2023   Procedure: INSERTION PORT-A-CATH;  Surgeon: Conrado Delay, DO;  Location: [facility];  Service: General;  Laterality: N/A;    SOCIAL HISTORY: Social History   Socioeconomic History   Marital status: Married    Spouse name: Aleta Anda   Number of children: 3   Years of education: Not on file   Highest education level: Not on file  Occupational History   Not on file  Tobacco Use   Smoking status: Former    Current packs/day: 0.00    Types: Cigarettes    Quit date: 2022    Years since quitting: 3.3   Smokeless tobacco: Never  Vaping Use   Vaping status: Never Used  Substance and Sexual Activity   Alcohol use: Yes    Comment: rarely   Drug use: No   Sexual activity: Yes    Birth control/protection: Surgical    Comment: tubal ligation  Other Topics Concern   Not on file  Social History Narrative   Not on file   Social Drivers  of Health   Financial Resource Strain: Low Risk  (04/05/2023)   Overall Financial Resource Strain (CARDIA)    Difficulty of Paying Living Expenses: Not very hard  Food Insecurity: No Food Insecurity (04/05/2023)   Hunger Vital Sign    Worried About Running Out of Food in the Last Year: Never true    Ran Out of Food in the Last Year: Never true  Transportation Needs: No Transportation Needs (02/17/2023)   PRAPARE - Administrator, Civil Service (Medical): No    Lack of Transportation (Non-Medical): No  Physical Activity: Inactive (04/05/2023)   Exercise Vital Sign  Days of Exercise per Week: 0 days    Minutes of Exercise per Session: 0 min  Stress: Stress Concern Present (04/05/2023)   [employer] of Occupational Health - Occupational Stress Questionnaire    Feeling of Stress : To some extent  Social Connections: Socially Integrated (04/05/2023)   Social Connection and Isolation Panel [NHANES]    Frequency of Communication with Friends and Family: Three times a week    Frequency of Social Gatherings with Friends and Family: Once a week    Attends Religious Services: 1 to 4 times per year    Active Member of [employer] or Organizations: Yes    Attends Banker Meetings: 1 to 4 times per year    Marital Status: Married  Catering manager Violence: Not At Risk (02/17/2023)   Humiliation, Afraid, Rape, and Kick questionnaire    Fear of Current or Ex-Partner: No    Emotionally Abused: No    Physically Abused: No    Sexually Abused: No    FAMILY HISTORY: Family History  Problem Relation Age of Onset   Hypercholesterolemia Mother    Diabetes Mellitus II Father    Hypertension Father    Breast cancer Paternal Aunt 32   Breast cancer Paternal Aunt 84   Breast cancer Paternal Grandmother        53s   Throat cancer Paternal Grandfather    Heart attack Paternal Grandfather     ALLERGIES:  has no known allergies.  MEDICATIONS:  Current Outpatient Medications   Medication Sig Dispense Refill   ALPRAZolam (XANAX) 0.5 MG tablet USE AS DIRECTED TAKE HALF TAB TWICE DAILY FOR ANXIETY 15 tablet 0   amphetamine -dextroamphetamine  (ADDERALL) 20 MG tablet Take 1 tablet (20 mg total) by mouth 2 (two) times daily. 60 tablet 0   amphetamine -dextroamphetamine  (ADDERALL) 20 MG tablet Take 1 tablet (20 mg total) by mouth 2 (two) times daily for 10 days. 20 tablet 0   aspirin-acetaminophen -caffeine (EXCEDRIN MIGRAINE) 250-250-65 MG tablet Take 1 tablet by mouth every 6 (six) hours as needed for headache.     clobetasol  ointment (TEMOVATE ) 0.05 % Apply to affected area once wkly as maintenance 45 g 0   diclofenac  Sodium (VOLTAREN ) 1 % GEL Apply 2 g topically 2 (two) times daily as needed. To right elbow 2 g 0   escitalopram  (LEXAPRO ) 10 MG tablet TAKE 1 TABLET BY MOUTH EVERY DAY 90 tablet 1   lidocaine -prilocaine  (EMLA ) cream Apply 1 Application topically as needed. 30 g 0   metoprolol  succinate (TOPROL -XL) 25 MG 24 hr tablet TAKE 1 TABLET BY MOUTH ONCE A DAY **MAKE FOLLOW UP APPT WITH DR. KHAN FOR REFILLS** 90 tablet 0   ondansetron  (ZOFRAN ) 4 MG tablet Take 1 tablet (4 mg total) by mouth every 8 (eight) hours as needed. 10 tablet 0   No current facility-administered medications for this visit.   Facility-Administered Medications Ordered in Other Visits  Medication Dose Route Frequency Provider Last Rate Last Admin   heparin  lock flush 100 unit/mL  500 Units Intravenous Once Sumaiyah Markert, MD       heparin  lock flush 100 unit/mL  500 Units Intravenous Once Idona Stach, MD       heparin  lock flush 100 unit/mL  500 Units Intracatheter Once PRN Savior Himebaugh, MD       sodium chloride  flush (NS) 0.9 % injection 10 mL  10 mL Intravenous PRN David Rodriquez, MD   10 mL at 11/01/23 1021    REVIEW OF SYSTEMS:  Pertinent information mentioned in HPI All other systems were reviewed with the patient and are negative.  PHYSICAL EXAMINATION: ECOG PERFORMANCE  STATUS: 0 - Asymptomatic  Vitals:   04/03/24 1304  BP: 118/87  Pulse: (!) 108  Resp: 19  Temp: 97.6 F (36.4 C)  SpO2: 99%     Filed Weights   04/03/24 1304  Weight: 199 lb 9.6 oz (90.5 kg)      GENERAL:alert, no distress and comfortable SKIN: skin color, texture, turgor are normal, no rashes or significant lesions EYES: normal, conjunctiva are pink and non-injected, sclera clear OROPHARYNX:no exudate, no erythema and lips, buccal mucosa, and tongue normal  NECK: supple, thyroid normal size, non-tender, without nodularity LYMPH:  no palpable lymphadenopathy in the cervical, axillary or inguinal LUNGS: clear to auscultation and percussion with normal breathing effort HEART: regular rate & rhythm and no murmurs and no lower extremity edema ABDOMEN:abdomen soft, non-tender and normal bowel sounds Musculoskeletal:no cyanosis of digits and no clubbing  PSYCH: alert & oriented x 3 with fluent speech NEURO: no focal motor/sensory deficits  LABORATORY DATA:  I have reviewed the data as listed Lab Results  Component Value Date   WBC 4.3 03/13/2024   HGB 11.4 (L) 03/13/2024   HCT 34.7 (L) 03/13/2024   MCV 83.2 03/13/2024   PLT 253 03/13/2024   Recent Labs    11/13/23 0749 12/13/23 1029 01/13/24 2057 03/13/24 0927  NA 140 139 139 136  K 3.4* 3.2* 3.7 3.3*  CL 106 105 104 107  CO2 24 24 24 22   GLUCOSE 145* 140* 97 127*  BUN 28* 19 22* 18  CREATININE 0.98 0.65 0.71 0.66  CALCIUM 9.3 9.4 9.6 9.2  GFRNONAA >60 >60 >60 >60  PROT 7.1 6.9  --  6.9  ALBUMIN 3.7 3.6  --  3.9  AST 18 21  --  18  ALT 25 26  --  20  ALKPHOS 88 90  --  96  BILITOT 0.5 0.7  --  0.8    RADIOGRAPHIC STUDIES: I have personally reviewed the radiological images as listed and agreed with the findings in the report. No results found.

## 2024-04-09 ENCOUNTER — Other Ambulatory Visit: Payer: Self-pay | Admitting: Internal Medicine

## 2024-04-09 DIAGNOSIS — F909 Attention-deficit hyperactivity disorder, unspecified type: Secondary | ICD-10-CM

## 2024-04-09 MED ORDER — AMPHETAMINE-DEXTROAMPHETAMINE 20 MG PO TABS: 20.0000 mg | ORAL_TABLET | Freq: Two times a day (BID) | ORAL | 0 refills | Status: DC

## 2024-04-16 ENCOUNTER — Encounter: Payer: Self-pay | Admitting: Internal Medicine

## 2024-04-16 ENCOUNTER — Ambulatory Visit
Admission: RE | Admit: 2024-04-16 | Discharge: 2024-04-16 | Disposition: A | Discharge status: Home / Self Care | Source: Ambulatory Visit | Attending: Internal Medicine | Admitting: Internal Medicine

## 2024-04-16 DIAGNOSIS — C50912 Malignant neoplasm of unspecified site of left female breast: Secondary | ICD-10-CM | POA: Diagnosis present

## 2024-04-16 DIAGNOSIS — R911 Solitary pulmonary nodule: Secondary | ICD-10-CM | POA: Diagnosis present

## 2024-04-16 DIAGNOSIS — Z171 Estrogen receptor negative status [ER-]: Secondary | ICD-10-CM | POA: Diagnosis present

## 2024-04-16 MED ORDER — IOHEXOL 300 MG/ML  SOLN
75.0000 mL | Freq: Once | INTRAMUSCULAR | Status: AC | PRN
  Administered 2024-04-16: 75 mL via INTRAVENOUS

## 2024-04-18 ENCOUNTER — Encounter: Payer: Self-pay | Admitting: Oncology

## 2024-04-18 ENCOUNTER — Encounter: Payer: Self-pay | Admitting: Internal Medicine

## 2024-04-20 ENCOUNTER — Encounter: Payer: Self-pay | Admitting: Internal Medicine

## 2024-04-24 ENCOUNTER — Inpatient Hospital Stay: Attending: Internal Medicine

## 2024-04-24 ENCOUNTER — Inpatient Hospital Stay (HOSPITAL_BASED_OUTPATIENT_CLINIC_OR_DEPARTMENT_OTHER): Admitting: Oncology

## 2024-04-24 ENCOUNTER — Encounter: Payer: Self-pay | Admitting: Oncology

## 2024-04-24 ENCOUNTER — Inpatient Hospital Stay

## 2024-04-24 VITALS — BP 97/73 | HR 81 | Temp 97.2°F | Resp 19

## 2024-04-24 VITALS — BP 118/80 | HR 95 | Temp 97.6°F | Resp 18 | Wt 194.0 lb

## 2024-04-24 DIAGNOSIS — C50912 Malignant neoplasm of unspecified site of left female breast: Secondary | ICD-10-CM

## 2024-04-24 DIAGNOSIS — Z171 Estrogen receptor negative status [ER-]: Secondary | ICD-10-CM | POA: Insufficient documentation

## 2024-04-24 DIAGNOSIS — Z5112 Encounter for antineoplastic immunotherapy: Secondary | ICD-10-CM | POA: Insufficient documentation

## 2024-04-24 DIAGNOSIS — G620 Drug-induced polyneuropathy: Secondary | ICD-10-CM | POA: Diagnosis not present

## 2024-04-24 DIAGNOSIS — Z79620 Long term (current) use of immunosuppressive biologic: Secondary | ICD-10-CM | POA: Diagnosis not present

## 2024-04-24 DIAGNOSIS — T451X5A Adverse effect of antineoplastic and immunosuppressive drugs, initial encounter: Secondary | ICD-10-CM

## 2024-04-24 DIAGNOSIS — C50412 Malignant neoplasm of upper-outer quadrant of left female breast: Secondary | ICD-10-CM | POA: Diagnosis present

## 2024-04-24 DIAGNOSIS — Z79899 Other long term (current) drug therapy: Secondary | ICD-10-CM | POA: Insufficient documentation

## 2024-04-24 LAB — CBC WITH DIFFERENTIAL/PLATELET
Abs Immature Granulocytes: 0.02 10*3/uL (ref 0.00–0.07)
Basophils Absolute: 0 10*3/uL (ref 0.0–0.1)
Basophils Relative: 0 %
Eosinophils Absolute: 0.1 10*3/uL (ref 0.0–0.5)
Eosinophils Relative: 2 %
HCT: 36.2 % (ref 36.0–46.0)
Hemoglobin: 12.1 g/dL (ref 12.0–15.0)
Immature Granulocytes: 0 %
Lymphocytes Relative: 38 %
Lymphs Abs: 1.7 10*3/uL (ref 0.7–4.0)
MCH: 27.7 pg (ref 26.0–34.0)
MCHC: 33.4 g/dL (ref 30.0–36.0)
MCV: 82.8 fL (ref 80.0–100.0)
Monocytes Absolute: 0.2 10*3/uL (ref 0.1–1.0)
Monocytes Relative: 5 %
Neutro Abs: 2.5 10*3/uL (ref 1.7–7.7)
Neutrophils Relative %: 55 %
Platelets: 250 10*3/uL (ref 150–400)
RBC: 4.37 MIL/uL (ref 3.87–5.11)
RDW: 13.2 % (ref 11.5–15.5)
WBC: 4.5 10*3/uL (ref 4.0–10.5)
nRBC: 0 % (ref 0.0–0.2)

## 2024-04-24 LAB — COMPREHENSIVE METABOLIC PANEL WITH GFR
ALT: 37 U/L (ref 0–44)
AST: 25 U/L (ref 15–41)
Albumin: 3.9 g/dL (ref 3.5–5.0)
Alkaline Phosphatase: 103 U/L (ref 38–126)
Anion gap: 10 (ref 5–15)
BUN: 23 mg/dL — ABNORMAL HIGH (ref 6–20)
CO2: 21 mmol/L — ABNORMAL LOW (ref 22–32)
Calcium: 9.2 mg/dL (ref 8.9–10.3)
Chloride: 107 mmol/L (ref 98–111)
Creatinine, Ser: 0.81 mg/dL (ref 0.44–1.00)
GFR, Estimated: >60 mL/min (ref >60)
Glucose, Bld: 107 mg/dL — ABNORMAL HIGH (ref 70–99)
Potassium: 3.1 mmol/L — ABNORMAL LOW (ref 3.5–5.1)
Sodium: 138 mmol/L (ref 135–145)
Total Bilirubin: 0.6 mg/dL (ref 0.0–1.2)
Total Protein: 7.4 g/dL (ref 6.5–8.1)

## 2024-04-24 MED ORDER — SODIUM CHLORIDE 0.9 % IV SOLN
6.0000 mg/kg | Freq: Once | INTRAVENOUS | Status: AC
  Administered 2024-04-24: 525 mg via INTRAVENOUS
  Filled 2024-04-24: qty 25

## 2024-04-24 MED ORDER — SODIUM CHLORIDE 0.9 % IV SOLN
420.0000 mg | Freq: Once | INTRAVENOUS | Status: AC
  Administered 2024-04-24: 420 mg via INTRAVENOUS
  Filled 2024-04-24: qty 14

## 2024-04-24 MED ORDER — HEPARIN SOD (PORK) LOCK FLUSH 100 UNIT/ML IV SOLN
500.0000 [IU] | Freq: Once | INTRAVENOUS | Status: AC | PRN
  Administered 2024-04-24: 500 [IU]
  Filled 2024-04-24: qty 5

## 2024-04-24 MED ORDER — ACETAMINOPHEN 325 MG PO TABS
650.0000 mg | ORAL_TABLET | Freq: Once | ORAL | Status: AC
  Administered 2024-04-24: 650 mg via ORAL
  Filled 2024-04-24: qty 2

## 2024-04-24 MED ORDER — DIPHENHYDRAMINE HCL 25 MG PO CAPS
50.0000 mg | ORAL_CAPSULE | Freq: Once | ORAL | Status: AC
  Administered 2024-04-24: 50 mg via ORAL
  Filled 2024-04-24: qty 2

## 2024-04-24 MED ORDER — SODIUM CHLORIDE 0.9 % IV SOLN
Freq: Once | INTRAVENOUS | Status: AC
Start: 2024-04-24 — End: 2024-04-24
  Filled 2024-04-24: qty 250

## 2024-04-24 NOTE — Progress Notes (Signed)
 Patient had a CT scan on 04/16/2024. She is doing well no new questions for the doctor today besides what the CT scan results are.

## 2024-04-24 NOTE — Progress Notes (Signed)
 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 There is a suspicious 24 mm mass at the site of palpable concern which is concerning for malignancy. It demonstrates 3 cm of associated calcifications. Recommend ultrasound-guided biopsy for definitive characterization with attention on post marker placement mammogram to assess for extent of calcifications in relation to the biopsy marking clip. 2. There is an indeterminate LEFT axillary lymph node. Recommend ultrasound-guided biopsy for definitive characterization. 3. No mammographic evidence of malignancy in the RIGHT breast.   02/10/2023 Pathology Results   DIAGNOSIS: A.  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  Size of invasive carcinoma: 1.7 cm in linear length in this sample Histologic grade of invasive carcinoma: Grade 3 Glandular/tubular differentiation score: 3 Nuclear pleomorphism score: 3 Mitotic rate score: 2 Total score: 8 Ductal carcinoma in situ: Present, high-grade Lymphovascular invasion: Indeterminate See comment.  B.  LYMPH NODE, LEFT AXILLA; STEREOTACTIC CORE BIOPSY: -  INVOLVED BY INVASIVE MAMMARY CARCINOMA, NO SPECIAL TYPE.   Estrogen Receptor (ER) Status: NEGATIVE (LESS THAN 1%)         Internal control cells positive Progesterone Receptor (PgR) Status: Negative HER2 (by immunohistochemistry): Positive Ki-67: Not performed    02/17/2023 Cancer Staging   Staging form: Breast, AJCC 8th Edition - Clinical: Stage IIB (cT2, cN1, cM0, G3, ER-, PR-, HER2+) - Signed by Agrawal, Kavita, MD on 02/17/2023 Histologic grading system: 3 grade system   03/07/2023 - 06/21/2023 Chemotherapy   Patient is on Treatment Plan : BREAST  Docetaxel  + Carboplatin  + Trastuzumab  + Pertuzumab   (TCHP) q21d       Genetic Testing   Negative genetic testing. No pathogenic variants identified on the [facility] Multi-Cancer+RNA panel. The report date is 03/01/2023.  The Multi-Cancer + RNA Panel offered by [facility] includes sequencing and/or deletion/duplication analysis of the following 70 genes:  AIP*, ALK, APC*, ATM*, AXIN2*, BAP1*, BARD1*, BLM*, BMPR1A*, BRCA1*, BRCA2*, BRIP1*, CDC73*, CDH1*, CDK4, CDKN1B*, CDKN2A, CHEK2*, CTNNA1*, DICER1*, EPCAM, EGFR, FH*, FLCN*, GREM1, HOXB13, KIT, LZTR1, MAX*, MBD4, MEN1*, MET, MITF, MLH1*, MSH2*, MSH3*, MSH6*, MUTYH*, NF1*, NF2*, NTHL1*, PALB2*, PDGFRA, PMS2*, POLD1*, POLE*, POT1*, PRKAR1A*, PTCH1*, PTEN*, RAD51C*, RAD51D*, RB1*, RET, SDHA*, SDHAF2*, SDHB*, SDHC*, SDHD*, SMAD4*, SMARCA4*, SMARCB1*, SMARCE1*, STK11*, SUFU*, TMEM127*, TP53*, TSC1*, TSC2*, VHL*. RNA analysis is performed for * genes.   08/30/2023 -  Chemotherapy   Patient is on Treatment Plan : BREAST Trastuzumab   + Pertuzumab  q21d x 13 cycles        Interval history-she feels well overall and denies any complaints today  ECOG PS- 0 Pain scale- 0   Review of systems- Review of Systems  Constitutional:  Negative for chills, fever, malaise/fatigue and weight loss.  HENT:  Negative for congestion, ear discharge  and nosebleeds.   Eyes:  Negative for blurred vision.  Respiratory:  Negative  for cough, hemoptysis, sputum production, shortness of breath and wheezing.   Cardiovascular:  Negative for chest pain, palpitations, orthopnea and claudication.  Gastrointestinal:  Negative for abdominal pain, blood in stool, constipation, diarrhea, heartburn, melena, nausea and vomiting.  Genitourinary:  Negative for dysuria, flank pain, frequency, hematuria and urgency.  Musculoskeletal:  Negative for back pain, joint pain and myalgias.  Skin:  Negative for rash.  Neurological:  Negative for dizziness, tingling, focal weakness, seizures, weakness and headaches.  Endo/Heme/Allergies:  Does not bruise/bleed easily.  Psychiatric/Behavioral:  Negative for depression and suicidal ideas. The patient does not have insomnia.       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    Family history of breast cancer 2015   IBIS=18.8%   Family history of ovarian cancer    Heart palpitations    Herpes genitalis    Hypomagnesemia    Leg swelling    Leukocytosis    Lichen sclerosus    Mixed hyperlipidemia    PAC (premature atrial contraction)    PONV (postoperative nausea and vomiting)    nausea   PVC's (premature ventricular contractions)    Tachycardia, unspecified      Past Surgical History:  Procedure Laterality Date   BREAST BIOPSY Left 02/10/2023   invasive mammary carcinoma   BREAST BIOPSY Left 02/10/2023   Us  Core Hydromark clip path pending   BREAST BIOPSY Left 02/10/2023   US  LT BREAST BX W LOC DEV 1ST LESION IMG BX SPEC US  GUIDE 02/10/2023 [facility]   BREAST BIOPSY Left 06/30/2023    US  LT RADIO FREQUENCY TAG EA ADD LESION LOC US  GUIDE 06/30/2023 [facility]   BREAST LUMPECTOMY,RADIO FREQ LOCALIZER,AXILLARY SENTINEL LYMPH NODE BIOPSY Left 07/28/2023   Procedure: BREAST LUMPECTOMY,RADIO FREQ LOCALIZER,AXILLARY SENTINEL LYMPH NODE BIOPSY;  Surgeon: Conrado Delay, DO;  Location: [facility];  Service: General;  Laterality: Left;   CESAREAN SECTION  01/03/2007   CESAREAN SECTION WITH BILATERAL TUBAL LIGATION N/A 03/26/2016   Procedure: CESAREAN SECTION WITH BILATERAL TUBAL LIGATION;  Surgeon: Kris Pester, MD;  Location: [facility];  Service: Obstetrics;  Laterality: N/A;   COLPOSCOPY     IUD REMOVAL     PORTACATH PLACEMENT N/A 02/24/2023   Procedure: INSERTION PORT-A-CATH;  Surgeon: Conrado Delay, DO;  Location: [facility];  Service: General;  Laterality: N/A;    Social History   Socioeconomic History   Marital status: Married    Spouse name: Aleta Anda   Number of children: 3   Years of education: Not on file   Highest education level: Not on file  Occupational History   Not on file  Tobacco Use   Smoking status: Former    Current packs/day: 0.00    Types: Cigarettes    Quit date: 2022    Years since quitting: 3.3   Smokeless tobacco: Never  Vaping Use   Vaping status: Never Used  Substance and Sexual Activity   Alcohol use: Yes  Comment: rarely   Drug use: No   Sexual activity: Yes    Birth control/protection: Surgical    Comment: tubal ligation  Other Topics Concern   Not on file  Social History Narrative   Not on file   Social Drivers of Health   Financial Resource Strain: Low Risk  (04/05/2023)   Overall Financial Resource Strain (CARDIA)    Difficulty of Paying Living Expenses: Not very hard  Food Insecurity: No Food Insecurity (04/05/2023)   Hunger Vital Sign    Worried About Running Out of Food in the Last Year: Never true    Ran Out of Food in the Last Year: Never true  Transportation Needs: No Transportation Needs (02/17/2023)   PRAPARE -  Administrator, Civil Service (Medical): No    Lack of Transportation (Non-Medical): No  Physical Activity: Inactive (04/05/2023)   Exercise Vital Sign    Days of Exercise per Week: 0 days    Minutes of Exercise per Session: 0 min  Stress: Stress Concern Present (04/05/2023)   [employer] of Occupational Health - Occupational Stress Questionnaire    Feeling of Stress : To some extent  Social Connections: Socially Integrated (04/05/2023)   Social Connection and Isolation Panel [NHANES]    Frequency of Communication with Friends and Family: Three times a week    Frequency of Social Gatherings with Friends and Family: Once a week    Attends Religious Services: 1 to 4 times per year    Active Member of [employer] or Organizations: Yes    Attends Banker Meetings: 1 to 4 times per year    Marital Status: Married  Catering manager Violence: Not At Risk (02/17/2023)   Humiliation, Afraid, Rape, and Kick questionnaire    Fear of Current or Ex-Partner: No    Emotionally Abused: No    Physically Abused: No    Sexually Abused: No    Family History  Problem Relation Age of Onset   Hypercholesterolemia Mother    Diabetes Mellitus II Father    Hypertension Father    Breast cancer Paternal Aunt 66   Breast cancer Paternal Aunt 23   Breast cancer Paternal Grandmother        60s   Throat cancer Paternal Grandfather    Heart attack Paternal Grandfather      Current Outpatient Medications:    ALPRAZolam (XANAX) 0.5 MG tablet, USE AS DIRECTED TAKE HALF TAB TWICE DAILY FOR ANXIETY, Disp: 15 tablet, Rfl: 0   amphetamine -dextroamphetamine  (ADDERALL) 20 MG tablet, Take 1 tablet (20 mg total) by mouth 2 (two) times daily for 10 days., Disp: 20 tablet, Rfl: 0   amphetamine -dextroamphetamine  (ADDERALL) 20 MG tablet, Take 1 tablet (20 mg total) by mouth 2 (two) times daily., Disp: 60 tablet, Rfl: 0   aspirin-acetaminophen -caffeine (EXCEDRIN MIGRAINE) 250-250-65 MG tablet, Take 1  tablet by mouth every 6 (six) hours as needed for headache., Disp: , Rfl:    clobetasol  ointment (TEMOVATE ) 0.05 %, Apply to affected area once wkly as maintenance, Disp: 45 g, Rfl: 0   diclofenac  Sodium (VOLTAREN ) 1 % GEL, Apply 2 g topically 2 (two) times daily as needed. To right elbow, Disp: 2 g, Rfl: 0   escitalopram  (LEXAPRO ) 10 MG tablet, TAKE 1 TABLET BY MOUTH EVERY DAY, Disp: 90 tablet, Rfl: 1   lidocaine -prilocaine  (EMLA ) cream, Apply 1 Application topically as needed., Disp: 30 g, Rfl: 0   metoprolol  succinate (TOPROL -XL) 25 MG 24 hr tablet, TAKE 1 TABLET BY  MOUTH ONCE A DAY **MAKE FOLLOW UP APPT WITH DR. KHAN FOR REFILLS**, Disp: 90 tablet, Rfl: 0   ondansetron  (ZOFRAN ) 4 MG tablet, Take 1 tablet (4 mg total) by mouth every 8 (eight) hours as needed., Disp: 10 tablet, Rfl: 0 No current facility-administered medications for this visit.  Facility-Administered Medications Ordered in Other Visits:    heparin  lock flush 100 unit/mL, 500 Units, Intravenous, Once, Agrawal, Kavita, MD   heparin  lock flush 100 unit/mL, 500 Units, Intravenous, Once, Agrawal, Kavita, MD   heparin  lock flush 100 unit/mL, 500 Units, Intracatheter, Once PRN, Agrawal, Kavita, MD   sodium chloride  flush (NS) 0.9 % injection 10 mL, 10 mL, Intravenous, PRN, Agrawal, Kavita, MD, 10 mL at 11/01/23 1021  Physical exam:  Vitals:   04/24/24 0854  BP: 118/80  Pulse: 95  Resp: 18  Temp: 97.6 F (36.4 C)  TempSrc: Tympanic  SpO2: 99%  Weight: 194 lb (88 kg)   Physical Exam Cardiovascular:     Rate and Rhythm: Normal rate and regular rhythm.     Heart sounds: Normal heart sounds.  Pulmonary:     Effort: Pulmonary effort is normal.     Breath sounds: Normal breath sounds.  Skin:    General: Skin is warm and dry.  Neurological:     Mental Status: She is alert and oriented to person, place, and time.      I have personally reviewed labs listed below:    Latest Ref Rng & Units 03/13/2024    9:27 AM  CMP   Glucose 70 - 99 mg/dL 213   BUN 6 - 20 mg/dL 18   Creatinine 0.86 - 1.00 mg/dL 5.78   Sodium 469 - 629 mmol/L 136   Potassium 3.5 - 5.1 mmol/L 3.3   Chloride 98 - 111 mmol/L 107   CO2 22 - 32 mmol/L 22   Calcium 8.9 - 10.3 mg/dL 9.2   Total Protein 6.5 - 8.1 g/dL 6.9   Total Bilirubin 0.0 - 1.2 mg/dL 0.8   Alkaline Phos 38 - 126 U/L 96   AST 15 - 41 U/L 18   ALT 0 - 44 U/L 20       Latest Ref Rng & Units 03/13/2024    9:27 AM  CBC  WBC 4.0 - 10.5 K/uL 4.3   Hemoglobin 12.0 - 15.0 g/dL 52.8   Hematocrit 41.3 - 46.0 % 34.7   Platelets 150 - 400 K/uL 253    I have personally reviewed Radiology images listed below: No images are attached to the encounter.  CT Chest W Contrast Result Date: 04/23/2024 CLINICAL DATA:  Follow-up right middle lobe pulmonary nodule. History of breast cancer. EXAM: CT CHEST WITH CONTRAST TECHNIQUE: Multidetector CT imaging of the chest was performed during intravenous contrast administration. RADIATION DOSE REDUCTION: This exam was performed according to the departmental dose-optimization program which includes automated exposure control, adjustment of the mA and/or kV according to patient size and/or use of iterative reconstruction technique. CONTRAST:  75mL OMNIPAQUE  IOHEXOL  300 MG/ML  SOLN COMPARISON:  Multiple previous imaging studies. The most recent chest CT is 11/13/2023 FINDINGS: Cardiovascular: The heart is normal in size. No pericardial effusion. The aorta is normal in caliber. No dissection. No coronary artery calcifications. Normal pulmonary arteries. Mediastinum/Nodes: Small scattered mediastinal and hilar lymph nodes but no mass or adenopathy. The esophagus is normal. Stable hazy interstitial changes in the anterior mediastinum likely residual thymic tissue. Lungs/Pleura: No acute pulmonary findings. No infiltrates, edema or effusions. Resolving  radiation changes involving the anterior aspect of the left lung. Stable 2 mm right middle lobe pulmonary  nodule on image 73/3, likely benign. No other pulmonary lesions or pulmonary nodules to suggest metastatic disease. Upper Abdomen: No significant upper abdominal findings. No hepatic or adrenal gland lesions. No upper abdominal adenopathy. Musculoskeletal: No supraclavicular or axillary adenopathy. The thyroid gland is normal. Stable small sclerotic lesion in the upper sternum, likely benign bone island. IMPRESSION: 1. Resolving radiation changes involving the anterior aspect of the left lung. 2. Stable 2 mm right middle lobe pulmonary nodule, likely benign. 3. No mediastinal or hilar mass or adenopathy. 4. Stable small sclerotic lesion in the upper sternum, likely benign bone island. Electronically Signed   By: Marrian Siva M.D.   On: 04/23/2024 19:31     Assessment and plan- Patient is a 38 y.o. female  with history of stage IIb invasive mammary carcinoma of the left breast grade 3 ER/PR negative HER2 positive status post neoadjuvant chemotherapy followed by surgery which showed pathological complete response and adjuvant radiation.  She is here for on treatment assessment prior to cycle 12 of adjuvant Herceptin  and Perjeta   Counts okay to proceed with cycle 12 adjuvant Herceptin  and Perjeta  today.  I will see her back in 3 weeks for cycle 13 which would be her last cycle.  I will plan to see her every 4 to 6 months thereafter for routine exams.  Patient has ER/PR negative disease and therefore would not benefit from adjuvant endocrine therapy.  I have reviewed CT chest Images independently and discussed findings with the patient patient was found to have a 3 mm right middle lobe lung nodule which presently measures 2 mm and overall stable and therefore considered benign.  She does not require any further CT scans in the future for surveillance given the stability over the last 8 months.  There was a questionable right ninth lesion noted at diagnosis which was ultimately termed to be a stable benign bone  island which also does not require follow-up.  Chemo-induced peripheral neuropathy: Overall stable.  Patient is not on any medications for the same.  Continue to monitor   Visit Diagnosis 1. Malignant neoplasm of left breast in female, estrogen receptor negative, unspecified site of breast (HCC)   2. Encounter for monoclonal antibody treatment for malignancy   3. Chemotherapy-induced peripheral neuropathy (HCC)      Dr. Seretha Dance, MD, MPH [facility] [phone] 04/24/2024 9:06 AM

## 2024-04-24 NOTE — Patient Instructions (Signed)

## 2024-04-27 ENCOUNTER — Telehealth: Payer: Self-pay | Admitting: *Deleted

## 2024-04-27 NOTE — Telephone Encounter (Signed)
 Can you look into this?

## 2024-04-27 NOTE — Telephone Encounter (Signed)
 She says to call back her telephone #[phone] and extension [phone]. This is a prior Auth for medicine [id number] , [id number] then [id number], and [id number].  Call the case manager want a call.

## 2024-04-27 NOTE — Telephone Encounter (Signed)
 Per Perley Bradley, the PA team will look into this.

## 2024-04-30 ENCOUNTER — Telehealth: Payer: Self-pay | Admitting: *Deleted

## 2024-04-30 NOTE — Telephone Encounter (Signed)
 The case manager wants to see what they can do to improve services of the benefits that the patient has with the treatment.  She would like a call from you about this

## 2024-05-02 NOTE — Telephone Encounter (Deleted)
 Called number provided [phone] extension [phone].

## 2024-05-03 NOTE — Telephone Encounter (Signed)
 Outbound call to CM from [place], [place] [phone] extension [phone]. CM stated patient has a [employer] medical benefit that covers certain medications and infusions. Lupron , Trastuzumab  and Pertuzumab  are possible meds that they can assist with.  Option 1 is white bagging - using a specialty pharmacy to ship drugs; admin cost is not denied.  Option 2 billing pharmacy option - can bill for medications under the patient's pharmacy benefits. Option 3 single case agreement - negotiate reimbursement of the drug; mentioned Darlena Clark used to assist her with this.  Option 4  is to have the patient set up on a designated [employer] provider.  Informed CM that I would look into this matter for her and forward it to the designated person to follow up accordingly. Secure chat sent to Darlena Clark; provided contact info for CM.    -

## 2024-05-07 ENCOUNTER — Other Ambulatory Visit: Payer: Self-pay | Admitting: Internal Medicine

## 2024-05-08 ENCOUNTER — Other Ambulatory Visit: Payer: Self-pay

## 2024-05-08 ENCOUNTER — Encounter: Payer: Self-pay | Admitting: Internal Medicine

## 2024-05-08 DIAGNOSIS — F909 Attention-deficit hyperactivity disorder, unspecified type: Secondary | ICD-10-CM

## 2024-05-08 MED ORDER — AMPHETAMINE-DEXTROAMPHETAMINE 20 MG PO TABS: 20.0000 mg | ORAL_TABLET | Freq: Two times a day (BID) | ORAL | 0 refills | Status: DC

## 2024-05-15 ENCOUNTER — Telehealth: Payer: Self-pay

## 2024-05-15 ENCOUNTER — Encounter: Payer: Self-pay | Admitting: Oncology

## 2024-05-15 ENCOUNTER — Ambulatory Visit

## 2024-05-15 ENCOUNTER — Inpatient Hospital Stay: Attending: Internal Medicine

## 2024-05-15 ENCOUNTER — Ambulatory Visit: Admitting: Oncology

## 2024-05-15 ENCOUNTER — Encounter: Payer: Self-pay | Admitting: *Deleted

## 2024-05-15 ENCOUNTER — Other Ambulatory Visit

## 2024-05-15 ENCOUNTER — Inpatient Hospital Stay (HOSPITAL_BASED_OUTPATIENT_CLINIC_OR_DEPARTMENT_OTHER): Admitting: Oncology

## 2024-05-15 ENCOUNTER — Inpatient Hospital Stay

## 2024-05-15 VITALS — BP 104/77 | HR 88 | Temp 98.8°F | Resp 18 | Ht 67.0 in | Wt 195.1 lb

## 2024-05-15 VITALS — BP 106/74 | HR 74 | Resp 18

## 2024-05-15 DIAGNOSIS — C50912 Malignant neoplasm of unspecified site of left female breast: Secondary | ICD-10-CM

## 2024-05-15 DIAGNOSIS — C50412 Malignant neoplasm of upper-outer quadrant of left female breast: Secondary | ICD-10-CM | POA: Diagnosis present

## 2024-05-15 DIAGNOSIS — Z5112 Encounter for antineoplastic immunotherapy: Secondary | ICD-10-CM | POA: Diagnosis present

## 2024-05-15 DIAGNOSIS — Z171 Estrogen receptor negative status [ER-]: Secondary | ICD-10-CM

## 2024-05-15 DIAGNOSIS — Z79620 Long term (current) use of immunosuppressive biologic: Secondary | ICD-10-CM | POA: Diagnosis not present

## 2024-05-15 DIAGNOSIS — Z79899 Other long term (current) drug therapy: Secondary | ICD-10-CM | POA: Diagnosis not present

## 2024-05-15 MED ORDER — SODIUM CHLORIDE 0.9 % IV SOLN
Freq: Once | INTRAVENOUS | Status: AC
  Filled 2024-05-15: qty 250

## 2024-05-15 MED ORDER — DIPHENHYDRAMINE HCL 25 MG PO CAPS
50.0000 mg | ORAL_CAPSULE | Freq: Once | ORAL | Status: AC
  Administered 2024-05-15: 50 mg via ORAL
  Filled 2024-05-15: qty 2

## 2024-05-15 MED ORDER — SODIUM CHLORIDE 0.9 % IV SOLN
420.0000 mg | Freq: Once | INTRAVENOUS | Status: AC
  Administered 2024-05-15: 420 mg via INTRAVENOUS
  Filled 2024-05-15: qty 14

## 2024-05-15 MED ORDER — HEPARIN SOD (PORK) LOCK FLUSH 100 UNIT/ML IV SOLN
500.0000 [IU] | Freq: Once | INTRAVENOUS | Status: AC | PRN
  Administered 2024-05-15: 500 [IU]
  Filled 2024-05-15: qty 5

## 2024-05-15 MED ORDER — TRASTUZUMAB-ANNS CHEMO 150 MG IV SOLR
6.0000 mg/kg | Freq: Once | INTRAVENOUS | Status: AC
  Administered 2024-05-15: 525 mg via INTRAVENOUS
  Filled 2024-05-15: qty 25

## 2024-05-15 MED ORDER — ACETAMINOPHEN 325 MG PO TABS
650.0000 mg | ORAL_TABLET | Freq: Once | ORAL | Status: AC
  Administered 2024-05-15: 650 mg via ORAL
  Filled 2024-05-15: qty 2

## 2024-05-15 MED ORDER — SODIUM CHLORIDE 0.9% FLUSH
10.0000 mL | INTRAVENOUS | Status: DC | PRN
  Administered 2024-05-15: 10 mL
  Filled 2024-05-15: qty 10

## 2024-05-15 NOTE — Progress Notes (Signed)
 Hematology/Oncology Consult note 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 There is a suspicious 24 mm mass at the site of palpable concern which is concerning for malignancy. It demonstrates 3 cm of associated calcifications. Recommend ultrasound-guided biopsy for definitive characterization with attention on post marker placement mammogram to assess for extent of calcifications in relation to the biopsy marking clip. 2. There is an indeterminate LEFT axillary lymph node. Recommend ultrasound-guided biopsy for definitive characterization. 3. No mammographic evidence of malignancy in the RIGHT breast.   02/10/2023 Pathology Results   DIAGNOSIS: A.  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  Size of invasive carcinoma: 1.7 cm in linear length in this sample Histologic grade of invasive carcinoma: Grade 3 Glandular/tubular differentiation score: 3 Nuclear pleomorphism score: 3 Mitotic rate score: 2 Total score: 8 Ductal carcinoma in situ: Present, high-grade Lymphovascular invasion: Indeterminate See comment.  B.  LYMPH NODE, LEFT AXILLA; STEREOTACTIC CORE BIOPSY: -  INVOLVED BY INVASIVE MAMMARY CARCINOMA, NO SPECIAL TYPE.   Estrogen Receptor (ER) Status: NEGATIVE (LESS THAN 1%)         Internal control cells positive Progesterone Receptor (PgR) Status: Negative HER2 (by immunohistochemistry): Positive Ki-67: Not performed    02/17/2023 Cancer Staging   Staging form: Breast, AJCC 8th Edition - Clinical: Stage IIB (cT2, cN1, cM0, G3, ER-, PR-, HER2+) - Signed by Agrawal, Kavita, MD on 02/17/2023 Histologic grading system: 3 grade system   03/07/2023 - 06/21/2023 Chemotherapy   Patient is on Treatment Plan : BREAST  Docetaxel  + Carboplatin  + Trastuzumab  + Pertuzumab   (TCHP) q21d       Genetic Testing   Negative genetic testing. No pathogenic variants identified on the [facility] Multi-Cancer+RNA panel. The report date is 03/01/2023.  The Multi-Cancer + RNA Panel offered by [facility] includes sequencing and/or deletion/duplication analysis of the following 70 genes:  AIP*, ALK, APC*, ATM*, AXIN2*, BAP1*, BARD1*, BLM*, BMPR1A*, BRCA1*, BRCA2*, BRIP1*, CDC73*, CDH1*, CDK4, CDKN1B*, CDKN2A, CHEK2*, CTNNA1*, DICER1*, EPCAM, EGFR, FH*, FLCN*, GREM1, HOXB13, KIT, LZTR1, MAX*, MBD4, MEN1*, MET, MITF, MLH1*, MSH2*, MSH3*, MSH6*, MUTYH*, NF1*, NF2*, NTHL1*, PALB2*, PDGFRA, PMS2*, POLD1*, POLE*, POT1*, PRKAR1A*, PTCH1*, PTEN*, RAD51C*, RAD51D*, RB1*, RET, SDHA*, SDHAF2*, SDHB*, SDHC*, SDHD*, SMAD4*, SMARCA4*, SMARCB1*, SMARCE1*, STK11*, SUFU*, TMEM127*, TP53*, TSC1*, TSC2*, VHL*. RNA analysis is performed for * genes.   08/30/2023 -  Chemotherapy   Patient is on Treatment Plan : BREAST Trastuzumab   + Pertuzumab  q21d x 13 cycles        Interval history-tolerating Herceptin  and Perjeta  well so far and denies any specific complaints at this time.  This is going to be her last cycle.  ECOG PS- 1 Pain scale- 0   Review of systems- Review of Systems  Constitutional:  Negative for chills, fever, malaise/fatigue and weight loss.  HENT:  Negative for congestion, ear discharge and  nosebleeds.   Eyes:  Negative for blurred vision.  Respiratory:  Negative for cough, hemoptysis, sputum production, shortness of breath and wheezing.   Cardiovascular:  Negative for chest pain, palpitations, orthopnea and claudication.  Gastrointestinal:  Negative for abdominal pain, blood in stool, constipation, diarrhea, heartburn, melena, nausea and vomiting.  Genitourinary:  Negative for dysuria, flank pain, frequency, hematuria and urgency.  Musculoskeletal:  Negative for back pain, joint pain and myalgias.  Skin:  Negative for rash.  Neurological:  Negative for dizziness, tingling, focal weakness, seizures, weakness and headaches.  Endo/Heme/Allergies:  Does not bruise/bleed easily.  Psychiatric/Behavioral:  Negative for depression and suicidal ideas. The patient does not have insomnia.       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    Family history of breast cancer 2015   IBIS=18.8%   Family history of ovarian cancer    Heart palpitations    Herpes genitalis    Hypomagnesemia    Leg swelling    Leukocytosis    Lichen sclerosus    Mixed hyperlipidemia    PAC (premature atrial contraction)    PONV (postoperative nausea and vomiting)    nausea   PVC's (premature ventricular contractions)    Tachycardia, unspecified      Past Surgical History:  Procedure Laterality Date   BREAST BIOPSY Left 02/10/2023   invasive mammary carcinoma   BREAST BIOPSY Left 02/10/2023   Us  Core Hydromark clip path pending   BREAST BIOPSY Left 02/10/2023   US  LT BREAST BX W LOC DEV 1ST LESION IMG BX  SPEC US  GUIDE 02/10/2023 [facility]   BREAST BIOPSY Left 06/30/2023   US  LT RADIO FREQUENCY TAG EA ADD LESION LOC US  GUIDE 06/30/2023 [facility]   BREAST LUMPECTOMY,RADIO FREQ LOCALIZER,AXILLARY SENTINEL LYMPH NODE BIOPSY Left 07/28/2023   Procedure: BREAST LUMPECTOMY,RADIO FREQ LOCALIZER,AXILLARY SENTINEL LYMPH NODE BIOPSY;  Surgeon: Conrado Delay, DO;  Location: [facility];  Service: General;  Laterality: Left;   CESAREAN SECTION  01/03/2007   CESAREAN SECTION WITH BILATERAL TUBAL LIGATION N/A 03/26/2016   Procedure: CESAREAN SECTION WITH BILATERAL TUBAL LIGATION;  Surgeon: Kris Pester, MD;  Location: [facility];  Service: Obstetrics;  Laterality: N/A;   COLPOSCOPY     IUD REMOVAL     PORTACATH PLACEMENT N/A 02/24/2023   Procedure: INSERTION PORT-A-CATH;  Surgeon: Conrado Delay, DO;  Location: [facility];  Service: General;  Laterality: N/A;    Social History   Socioeconomic History   Marital status: Married    Spouse name: Aleta Anda   Number of children: 3   Years of education: Not on file   Highest education level: Not on file  Occupational History   Not on file  Tobacco Use   Smoking status: Former    Current packs/day: 0.00    Types: Cigarettes    Quit date: 2022    Years since quitting: 3.4   Smokeless tobacco: Never  Vaping Use  Vaping status: Never Used  Substance and Sexual Activity   Alcohol use: Yes    Comment: rarely   Drug use: No   Sexual activity: Yes    Birth control/protection: Surgical    Comment: tubal ligation  Other Topics Concern   Not on file  Social History Narrative   Not on file   Social Drivers of Health   Financial Resource Strain: Low Risk  (04/05/2023)   Overall Financial Resource Strain (CARDIA)    Difficulty of Paying Living Expenses: Not very hard  Food Insecurity: No Food Insecurity (04/05/2023)   Hunger Vital Sign    Worried About Running Out of Food in the Last Year: Never true    Ran Out of Food in the Last Year: Never true   Transportation Needs: No Transportation Needs (02/17/2023)   PRAPARE - Administrator, Civil Service (Medical): No    Lack of Transportation (Non-Medical): No  Physical Activity: Inactive (04/05/2023)   Exercise Vital Sign    Days of Exercise per Week: 0 days    Minutes of Exercise per Session: 0 min  Stress: Stress Concern Present (04/05/2023)   [employer] of Occupational Health - Occupational Stress Questionnaire    Feeling of Stress : To some extent  Social Connections: Socially Integrated (04/05/2023)   Social Connection and Isolation Panel [NHANES]    Frequency of Communication with Friends and Family: Three times a week    Frequency of Social Gatherings with Friends and Family: Once a week    Attends Religious Services: 1 to 4 times per year    Active Member of [employer] or Organizations: Yes    Attends Banker Meetings: 1 to 4 times per year    Marital Status: Married  Catering manager Violence: Not At Risk (02/17/2023)   Humiliation, Afraid, Rape, and Kick questionnaire    Fear of Current or Ex-Partner: No    Emotionally Abused: No    Physically Abused: No    Sexually Abused: No    Family History  Problem Relation Age of Onset   Hypercholesterolemia Mother    Diabetes Mellitus II Father    Hypertension Father    Breast cancer Paternal Aunt 84   Breast cancer Paternal Aunt 69   Breast cancer Paternal Grandmother        59s   Throat cancer Paternal Grandfather    Heart attack Paternal Grandfather      Current Outpatient Medications:    dexamethasone  (DECADRON ) 4 MG tablet, Take by mouth., Disp: , Rfl:    ALPRAZolam (XANAX) 0.5 MG tablet, USE AS DIRECTED TAKE HALF TAB TWICE DAILY FOR ANXIETY, Disp: 15 tablet, Rfl: 0   amphetamine -dextroamphetamine  (ADDERALL) 20 MG tablet, Take 1 tablet (20 mg total) by mouth 2 (two) times daily., Disp: 60 tablet, Rfl: 0   aspirin-acetaminophen -caffeine (EXCEDRIN MIGRAINE) 250-250-65 MG tablet, Take 1 tablet by  mouth every 6 (six) hours as needed for headache., Disp: , Rfl:    clobetasol  ointment (TEMOVATE ) 0.05 %, Apply to affected area once wkly as maintenance, Disp: 45 g, Rfl: 0   diclofenac  Sodium (VOLTAREN ) 1 % GEL, Apply 2 g topically 2 (two) times daily as needed. To right elbow, Disp: 2 g, Rfl: 0   escitalopram  (LEXAPRO ) 10 MG tablet, TAKE 1 TABLET BY MOUTH EVERY DAY, Disp: 90 tablet, Rfl: 1   lidocaine -prilocaine  (EMLA ) cream, Apply 1 Application topically as needed., Disp: 30 g, Rfl: 0   metoprolol  succinate (TOPROL -XL) 25 MG 24 hr tablet,  TAKE 1 TABLET BY MOUTH ONCE A DAY **MAKE FOLLOW UP APPT WITH DR. KHAN FOR REFILLS**, Disp: 90 tablet, Rfl: 0   ondansetron  (ZOFRAN ) 4 MG tablet, Take 1 tablet (4 mg total) by mouth every 8 (eight) hours as needed., Disp: 10 tablet, Rfl: 0 No current facility-administered medications for this visit.  Facility-Administered Medications Ordered in Other Visits:    0.9 %  sodium chloride  infusion, , Intravenous, Once, [place] Erby C, MD   heparin  lock flush 100 unit/mL, 500 Units, Intravenous, Once, Agrawal, Kavita, MD   heparin  lock flush 100 unit/mL, 500 Units, Intravenous, Once, Agrawal, Kavita, MD   heparin  lock flush 100 unit/mL, 500 Units, Intracatheter, Once PRN, Agrawal, Kavita, MD   heparin  lock flush 100 unit/mL, 500 Units, Intracatheter, Once PRN, Kainat Pizana C, MD   pertuzumab  (PERJETA ) 420 mg in sodium chloride  0.9 % 250 mL chemo infusion, 420 mg, Intravenous, Once, Avonne Boettcher, MD   sodium chloride  flush (NS) 0.9 % injection 10 mL, 10 mL, Intravenous, PRN, Agrawal, Kavita, MD, 10 mL at 11/01/23 1021   sodium chloride  flush (NS) 0.9 % injection 10 mL, 10 mL, Intracatheter, PRN, Avonne Boettcher, MD   trastuzumab -anns (KANJINTI ) 525 mg in sodium chloride  0.9 % 250 mL chemo infusion, 6 mg/kg (Treatment Plan Recorded), Intravenous, Once, Avonne Boettcher, MD  Physical exam:  Vitals:   05/15/24 0849  BP: 104/77  Pulse: 88  Resp: 18  Temp: 98.8 F  (37.1 C)  TempSrc: Tympanic  SpO2: 97%  Weight: 195 lb 1.6 oz (88.5 kg)  Height: 5\' 7"  (1.702 m)   Physical Exam Cardiovascular:     Rate and Rhythm: Normal rate and regular rhythm.     Heart sounds: Normal heart sounds.  Pulmonary:     Effort: Pulmonary effort is normal.     Breath sounds: Normal breath sounds.  Skin:    General: Skin is warm and dry.  Neurological:     Mental Status: She is alert and oriented to person, place, and time.      I have personally reviewed labs listed below:    Latest Ref Rng & Units 04/24/2024    8:43 AM  CMP  Glucose 70 - 99 mg/dL 846   BUN 6 - 20 mg/dL 23   Creatinine 9.62 - 1.00 mg/dL 9.52   Sodium 841 - 324 mmol/L 138   Potassium 3.5 - 5.1 mmol/L 3.1   Chloride 98 - 111 mmol/L 107   CO2 22 - 32 mmol/L 21   Calcium 8.9 - 10.3 mg/dL 9.2   Total Protein 6.5 - 8.1 g/dL 7.4   Total Bilirubin 0.0 - 1.2 mg/dL 0.6   Alkaline Phos 38 - 126 U/L 103   AST 15 - 41 U/L 25   ALT 0 - 44 U/L 37       Latest Ref Rng & Units 04/24/2024    8:43 AM  CBC  WBC 4.0 - 10.5 K/uL 4.5   Hemoglobin 12.0 - 15.0 g/dL 40.1   Hematocrit 02.7 - 46.0 % 36.2   Platelets 150 - 400 K/uL 250    I have personally reviewed Radiology images listed below: No images are attached to the encounter.  CT Chest W Contrast Result Date: 04/23/2024 CLINICAL DATA:  Follow-up right middle lobe pulmonary nodule. History of breast cancer. EXAM: CT CHEST WITH CONTRAST TECHNIQUE: Multidetector CT imaging of the chest was performed during intravenous contrast administration. RADIATION DOSE REDUCTION: This exam was performed according to the departmental  dose-optimization program which includes automated exposure control, adjustment of the mA and/or kV according to patient size and/or use of iterative reconstruction technique. CONTRAST:  75mL OMNIPAQUE  IOHEXOL  300 MG/ML  SOLN COMPARISON:  Multiple previous imaging studies. The most recent chest CT is 11/13/2023 FINDINGS: Cardiovascular:  The heart is normal in size. No pericardial effusion. The aorta is normal in caliber. No dissection. No coronary artery calcifications. Normal pulmonary arteries. Mediastinum/Nodes: Small scattered mediastinal and hilar lymph nodes but no mass or adenopathy. The esophagus is normal. Stable hazy interstitial changes in the anterior mediastinum likely residual thymic tissue. Lungs/Pleura: No acute pulmonary findings. No infiltrates, edema or effusions. Resolving radiation changes involving the anterior aspect of the left lung. Stable 2 mm right middle lobe pulmonary nodule on image 73/3, likely benign. No other pulmonary lesions or pulmonary nodules to suggest metastatic disease. Upper Abdomen: No significant upper abdominal findings. No hepatic or adrenal gland lesions. No upper abdominal adenopathy. Musculoskeletal: No supraclavicular or axillary adenopathy. The thyroid gland is normal. Stable small sclerotic lesion in the upper sternum, likely benign bone island. IMPRESSION: 1. Resolving radiation changes involving the anterior aspect of the left lung. 2. Stable 2 mm right middle lobe pulmonary nodule, likely benign. 3. No mediastinal or hilar mass or adenopathy. 4. Stable small sclerotic lesion in the upper sternum, likely benign bone island. Electronically Signed   By: Marrian Siva M.D.   On: 04/23/2024 19:31     Assessment and plan- Patient is a 38 y.o. 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  She then went on to receive adjuvant radiation.  She is here for on treatment assessment prior to cycle 13 of adjuvant Herceptin  and Perjeta   Patient will proceed with cycle 13 of adjuvant Herceptin  and Perjeta  today which will be her last cycle.  We will plan to get her port out at this time.  Given that she has ER/PR negative disease there would be no role for endocrine  therapy.  I will see her back in 4 months with labs CBC with differential and CMP   Visit Diagnosis 1. Malignant neoplasm of left breast in female, estrogen receptor negative, unspecified site of breast (HCC)   2. Encounter for monoclonal antibody treatment for malignancy      Dr. Seretha Dance, MD, MPH [facility] [phone] 05/15/2024 9:42 AM

## 2024-05-15 NOTE — Progress Notes (Signed)
 Patient would like port removed after completing treatment today.  Faxed referral and other info to [facility]; fax confirmation received.

## 2024-05-15 NOTE — Telephone Encounter (Signed)
 Per Dr. Randy Buttery patient would like port removed after completing treatment today.  Faxed referral and other info to [facility]; fax confirmation received.  Will follow up in a couple of weeks to see if it's been scheduled.

## 2024-05-16 ENCOUNTER — Encounter: Payer: Self-pay | Admitting: Oncology

## 2024-05-16 ENCOUNTER — Other Ambulatory Visit: Payer: Self-pay

## 2024-05-20 ENCOUNTER — Other Ambulatory Visit: Payer: Self-pay

## 2024-05-24 ENCOUNTER — Encounter: Payer: Self-pay | Admitting: Radiation Oncology

## 2024-05-24 ENCOUNTER — Ambulatory Visit
Admission: RE | Admit: 2024-05-24 | Discharge: 2024-05-24 | Disposition: A | Discharge status: Home / Self Care | Payer: Managed Care, Other (non HMO) | Source: Ambulatory Visit | Attending: Radiation Oncology | Admitting: Radiation Oncology

## 2024-05-24 VITALS — BP 113/89 | HR 87 | Resp 16 | Ht 67.0 in | Wt 192.8 lb

## 2024-05-24 DIAGNOSIS — Z923 Personal history of irradiation: Secondary | ICD-10-CM | POA: Insufficient documentation

## 2024-05-24 DIAGNOSIS — Z171 Estrogen receptor negative status [ER-]: Secondary | ICD-10-CM | POA: Insufficient documentation

## 2024-05-24 DIAGNOSIS — C50412 Malignant neoplasm of upper-outer quadrant of left female breast: Secondary | ICD-10-CM | POA: Insufficient documentation

## 2024-05-24 NOTE — Progress Notes (Signed)
 Radiation Oncology Follow up Note  Name: Cristina Baker   Date:   05/24/2024 MRN:  [id number] DOB: 01-31-1986    This 38 y.o. female presents to the clinic today for 18-month follow-up status post whole breast radiation to her left breast status post neoadjuvant chemotherapy for stage IIb (cT2 N1 M0) HER2/neu overexpressed ER/PR negative invasive mammary carcinoma with a complete response at the time of wide local excision and sentinel node biopsy.  REFERRING PROVIDER: Shari Daughters, MD  HPI: Patient is a 38 year old female now out 7 months having completed whole breast radiation to her left breast status post neoadjuvant chemotherapy for stage IIb ER/PR negative HER2/neu overexpressed invasive mammary carcinoma.  Seen today in routine follow-up she is doing well specifically denies breast tenderness cough or bone pain..  She had a CT scan in May for a right middle lobe pulmonary nodule which on CT scan was benign appearing.  No other mediastinal hilar adenopathy was identified she had some resolving radiation changes involve the anterior aspect of the left lung.  Patient also had mammograms back in March which I have reviewed which were BI-RADS 2 benign.  Patient is currently on cycle 13 of adjuvant Herceptin  and Perjeta  which she is tolerating well  COMPLICATIONS OF TREATMENT: none  FOLLOW UP COMPLIANCE: keeps appointments   PHYSICAL EXAM:  BP 113/89   Pulse 87   Resp 16   Ht 5' 7 (1.702 m)   Wt 192 lb 12.8 oz (87.5 kg)   BMI 30.20 kg/m  Lungs are clear to A&P cardiac examination essentially unremarkable with regular rate and rhythm. No dominant mass or nodularity is noted in either breast in 2 positions examined. Incision is well-healed. No axillary or supraclavicular adenopathy is appreciated. Cosmetic result is excellent.  Well-developed well-nourished patient in NAD. HEENT reveals PERLA, EOMI, discs not visualized.  Oral cavity is clear. No oral mucosal lesions are identified.  Neck is clear without evidence of cervical or supraclavicular adenopathy. Lungs are clear to A&P. Cardiac examination is essentially unremarkable with regular rate and rhythm without murmur rub or thrill. Abdomen is benign with no organomegaly or masses noted. Motor sensory and DTR levels are equal and symmetric in the upper and lower extremities. Cranial nerves II through XII are grossly intact. Proprioception is intact. No peripheral adenopathy or edema is identified. No motor or sensory levels are noted. Crude visual fields are within normal range.  RADIOLOGY RESULTS: Mammograms and CT scans both reviewed compatible with above-stated findings  PLAN: Present time patient is doing well excellent,'s cosmetic results from her radiation therapy.  She continues on Herceptin  and Perjeta  under medical oncology's direction.  I have asked to see her back in 6 months for follow-up.  Patient knows to call with any concerns at any time.  I would like to take this opportunity to thank you for allowing me to participate in the care of your patient.Glenis Langdon, MD

## 2024-05-25 ENCOUNTER — Other Ambulatory Visit: Payer: Self-pay

## 2024-05-28 ENCOUNTER — Encounter: Payer: Self-pay | Admitting: Oncology

## 2024-06-11 ENCOUNTER — Ambulatory Visit (INDEPENDENT_AMBULATORY_CARE_PROVIDER_SITE_OTHER): Admitting: Internal Medicine

## 2024-06-11 ENCOUNTER — Encounter: Payer: Self-pay | Admitting: Internal Medicine

## 2024-06-11 VITALS — BP 130/92 | HR 84 | Temp 97.7°F | Ht 66.0 in | Wt 196.2 lb

## 2024-06-11 DIAGNOSIS — F909 Attention-deficit hyperactivity disorder, unspecified type: Secondary | ICD-10-CM

## 2024-06-11 DIAGNOSIS — E782 Mixed hyperlipidemia: Secondary | ICD-10-CM

## 2024-06-11 DIAGNOSIS — F419 Anxiety disorder, unspecified: Secondary | ICD-10-CM

## 2024-06-11 DIAGNOSIS — E669 Obesity, unspecified: Secondary | ICD-10-CM

## 2024-06-11 DIAGNOSIS — Z0130 Encounter for examination of blood pressure without abnormal findings: Secondary | ICD-10-CM

## 2024-06-11 MED ORDER — AMPHETAMINE-DEXTROAMPHETAMINE 20 MG PO TABS: 20.0000 mg | ORAL_TABLET | Freq: Two times a day (BID) | ORAL | 0 refills | Status: DC

## 2024-06-11 NOTE — Progress Notes (Signed)
 Established Patient Office Visit  Subjective:  Patient ID: Cristina Baker, female    DOB: [birth year]  Age: 38 y.o. MRN: [id number]  Chief Complaint  Patient presents with   Follow-up    3 month follow up    No new complaints, here for lab review and medication refills. Regained some weight after her compound semaglutide ran out.     No other concerns at this time.   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    Family history of breast cancer 2015   IBIS=18.8%   Family history of ovarian cancer    Heart palpitations    Herpes genitalis    Hypomagnesemia    Leg swelling    Leukocytosis    Lichen sclerosus    Mixed hyperlipidemia    PAC (premature atrial contraction)    PONV (postoperative nausea and vomiting)    nausea   PVC'Daphnie Venturini (premature ventricular contractions)    Tachycardia, unspecified     Past Surgical History:  Procedure Laterality Date   BREAST BIOPSY Left 02/10/2023   invasive mammary carcinoma   BREAST BIOPSY Left 02/10/2023   Us  Core Hydromark clip path pending   BREAST BIOPSY Left 02/10/2023   US  LT BREAST BX W LOC DEV 1ST LESION IMG BX SPEC US  GUIDE 02/10/2023 [facility]   BREAST BIOPSY Left 06/30/2023   US  LT RADIO FREQUENCY TAG EA ADD LESION LOC US  GUIDE 06/30/2023 [facility]   BREAST LUMPECTOMY,RADIO FREQ LOCALIZER,AXILLARY SENTINEL LYMPH NODE BIOPSY Left 07/28/2023   Procedure: BREAST LUMPECTOMY,RADIO FREQ LOCALIZER,AXILLARY SENTINEL LYMPH NODE BIOPSY;  Surgeon: Tye Millet, DO;  Location: [facility];  Service: General;  Laterality: Left;   CESAREAN SECTION  01/03/2007    CESAREAN SECTION WITH BILATERAL TUBAL LIGATION N/A 03/26/2016   Procedure: CESAREAN SECTION WITH BILATERAL TUBAL LIGATION;  Surgeon: Garnette JONETTA Mace, MD;  Location: [facility];  Service: Obstetrics;  Laterality: N/A;   COLPOSCOPY     IUD REMOVAL     PORTACATH PLACEMENT N/A 02/24/2023   Procedure: INSERTION PORT-A-CATH;  Surgeon: Tye Millet, DO;  Location: [facility];  Service: General;  Laterality: N/A;    Social History   Socioeconomic History   Marital status: Married    Spouse name: Josefa   Number of children: 3   Years of education: Not on file   Highest education level: Not on file  Occupational History   Not on file  Tobacco Use   Smoking status: Former    Current packs/day: 0.00    Types: Cigarettes    Quit date: 2022    Years since quitting: 3.4   Smokeless tobacco: Never  Vaping Use   Vaping status: Never Used  Substance and Sexual Activity   Alcohol use: Yes    Comment: rarely   Drug use: No   Sexual activity: Yes    Birth control/protection: Surgical    Comment: tubal ligation  Other Topics Concern   Not on file  Social History Narrative   Not on file   Social Drivers of Health   Financial  Resource Strain: Low Risk  (04/05/2023)   Overall Financial Resource Strain (CARDIA)    Difficulty of Paying Living Expenses: Not very hard  Food Insecurity: No Food Insecurity (04/05/2023)   Hunger Vital Sign    Worried About Running Out of Food in the Last Year: Never true    Ran Out of Food in the Last Year: Never true  Transportation Needs: No Transportation Needs (02/17/2023)   PRAPARE - Administrator, Civil Service (Medical): No    Lack of Transportation (Non-Medical): No  Physical Activity: Inactive (04/05/2023)   Exercise Vital Sign    Days of Exercise per Week: 0 days    Minutes of Exercise per Session: 0 min  Stress: Stress Concern Present (04/05/2023)   [employer] of Occupational Health - Occupational Stress Questionnaire    Feeling of  Stress : To some extent  Social Connections: Socially Integrated (04/05/2023)   Social Connection and Isolation Panel    Frequency of Communication with Friends and Family: Three times a week    Frequency of Social Gatherings with Friends and Family: Once a week    Attends Religious Services: 1 to 4 times per year    Active Member of [employer] or Organizations: Yes    Attends Banker Meetings: 1 to 4 times per year    Marital Status: Married  Catering manager Violence: Not At Risk (02/17/2023)   Humiliation, Afraid, Rape, and Kick questionnaire    Fear of Current or Ex-Partner: No    Emotionally Abused: No    Physically Abused: No    Sexually Abused: No    Family History  Problem Relation Age of Onset   Hypercholesterolemia Mother    Diabetes Mellitus II Father    Hypertension Father    Breast cancer Paternal Aunt 46   Breast cancer Paternal Aunt 47   Breast cancer Paternal Grandmother        60s   Throat cancer Paternal Grandfather    Heart attack Paternal Grandfather     No Known Allergies  Outpatient Medications Prior to Visit  Medication Sig   clobetasol  ointment (TEMOVATE ) 0.05 % Apply to affected area once wkly as maintenance   escitalopram  (LEXAPRO ) 10 MG tablet TAKE 1 TABLET BY MOUTH EVERY DAY   metoprolol  succinate (TOPROL -XL) 25 MG 24 hr tablet TAKE 1 TABLET BY MOUTH ONCE A DAY **MAKE FOLLOW UP APPT WITH DR. KHAN FOR REFILLS**   [DISCONTINUED] amphetamine -dextroamphetamine  (ADDERALL) 20 MG tablet Take 1 tablet (20 mg total) by mouth 2 (two) times daily.   [DISCONTINUED] ALPRAZolam (XANAX) 0.5 MG tablet USE AS DIRECTED TAKE HALF TAB TWICE DAILY FOR ANXIETY (Patient not taking: Reported on 06/11/2024)   [DISCONTINUED] aspirin-acetaminophen -caffeine (EXCEDRIN MIGRAINE) 250-250-65 MG tablet Take 1 tablet by mouth every 6 (six) hours as needed for headache. (Patient not taking: Reported on 06/11/2024)   [DISCONTINUED] dexamethasone  (DECADRON ) 4 MG tablet Take by  mouth. (Patient not taking: Reported on 06/11/2024)   [DISCONTINUED] diclofenac  Sodium (VOLTAREN ) 1 % GEL Apply 2 g topically 2 (two) times daily as needed. To right elbow (Patient not taking: Reported on 06/11/2024)   [DISCONTINUED] lidocaine -prilocaine  (EMLA ) cream Apply 1 Application topically as needed. (Patient not taking: Reported on 06/11/2024)   [DISCONTINUED] ondansetron  (ZOFRAN ) 4 MG tablet Take 1 tablet (4 mg total) by mouth every 8 (eight) hours as needed. (Patient not taking: Reported on 06/11/2024)   Facility-Administered Medications Prior to Visit  Medication Dose Route Frequency Provider   heparin  lock flush 100  unit/mL  500 Units Intravenous Once Agrawal, Kavita, MD   heparin  lock flush 100 unit/mL  500 Units Intravenous Once Agrawal, Kavita, MD   heparin  lock flush 100 unit/mL  500 Units Intracatheter Once PRN Agrawal, Kavita, MD   sodium chloride  flush (NS) 0.9 % injection 10 mL  10 mL Intravenous PRN Agrawal, Kavita, MD    Review of Systems  Constitutional: Negative.  Negative for weight loss.  HENT: Negative.    Eyes: Negative.   Respiratory: Negative.    Cardiovascular: Negative.   Gastrointestinal: Negative.   Genitourinary: Negative.   Skin: Negative.   Neurological: Negative.   Endo/Heme/Allergies: Negative.        Objective:   BP (!) 130/92   Pulse 84   Temp 97.7 F (36.5 C)   Ht 5' 6 (1.676 m)   Wt 196 lb 3.2 oz (89 kg)   SpO2 98%   BMI 31.67 kg/m   Vitals:   06/11/24 1312  BP: (!) 130/92  Pulse: 84  Temp: 97.7 F (36.5 C)  Height: 5' 6 (1.676 m)  Weight: 196 lb 3.2 oz (89 kg)  SpO2: 98%  BMI (Calculated): 31.68    Physical Exam Vitals reviewed.  Constitutional:      General: She is not in acute distress. HENT:     Head: Normocephalic.     Nose: Nose normal.     Mouth/Throat:     Mouth: Mucous membranes are moist.   Eyes:     Extraocular Movements: Extraocular movements intact.     Pupils: Pupils are equal, round, and reactive  to light.    Cardiovascular:     Rate and Rhythm: Normal rate and regular rhythm.     Heart sounds: No murmur heard. Pulmonary:     Effort: Pulmonary effort is normal.     Breath sounds: No rhonchi or rales.  Abdominal:     General: Abdomen is flat.     Palpations: There is no hepatomegaly, splenomegaly or mass.   Musculoskeletal:        General: Normal range of motion.     Cervical back: Normal range of motion. No tenderness.   Skin:    General: Skin is warm and dry.   Neurological:     General: No focal deficit present.     Mental Status: She is alert and oriented to person, place, and time.     Cranial Nerves: No cranial nerve deficit.     Motor: No weakness.   Psychiatric:        Mood and Affect: Mood normal.        Behavior: Behavior normal.      No results found for any visits on 06/11/24.      Assessment & Plan:  As per problem list  Problem List Items Addressed This Visit       Other   Adult ADHD (attention deficit hyperactivity disorder) - Primary   Relevant Medications   amphetamine -dextroamphetamine  (ADDERALL) 20 MG tablet   amphetamine -dextroamphetamine  (ADDERALL) 20 MG tablet (Start on 07/12/2024)   amphetamine -dextroamphetamine  (ADDERALL) 20 MG tablet (Start on 08/12/2024)   Mixed hyperlipidemia   Relevant Orders   Lipid panel   Anxiety   Obesity (BMI 30-39.9)   Relevant Medications   amphetamine -dextroamphetamine  (ADDERALL) 20 MG tablet   amphetamine -dextroamphetamine  (ADDERALL) 20 MG tablet (Start on 07/12/2024)   amphetamine -dextroamphetamine  (ADDERALL) 20 MG tablet (Start on 08/12/2024)    Return in about 3 months (around 09/11/2024) for fu with labs prior.  Total time spent: 20 minutes  Sherrill Cinderella Perry, MD  06/11/2024   This document may have been prepared by [facility] Voice Recognition software and as such may include unintentional dictation errors.

## 2024-08-30 ENCOUNTER — Telehealth: Payer: Self-pay

## 2024-08-30 NOTE — Telephone Encounter (Signed)
 Nurse Case Manager Antionette with [employer] was calling to indicate authorization for Lupron  expired 08/28/24 and was inquiring if another authorization will need to be submitted. Informed patient has completed treatment, plan is to have port removed and patient does not have any future injections scheduled at this time.  Next regular follow up is with Dr. Melanee and labs will be drawn.  Indicated if patient is restarted on Lupron  another authorization will need to be obtained.
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
## 2024-09-05 ENCOUNTER — Encounter: Payer: Self-pay | Admitting: Obstetrics

## 2024-09-05 ENCOUNTER — Ambulatory Visit (INDEPENDENT_AMBULATORY_CARE_PROVIDER_SITE_OTHER): Admitting: Obstetrics

## 2024-09-05 VITALS — BP 128/84 | HR 95 | Ht 66.0 in

## 2024-09-05 DIAGNOSIS — Z01419 Encounter for gynecological examination (general) (routine) without abnormal findings: Secondary | ICD-10-CM

## 2024-09-05 DIAGNOSIS — L900 Lichen sclerosus et atrophicus: Secondary | ICD-10-CM

## 2024-09-05 NOTE — Patient Instructions (Addendum)
 How to Do a Breast Self-Exam Doing breast self-exams can help you stay healthy. They're one way to know what's normal for your breasts. They can help you catch a problem while it's still small and can be treated. You need to: Check your breasts often. Tell your doctor about any changes. You should do breast self-exams even if you have breast implants. What you need: A mirror. A well-lit room. A pillow or other soft object. How to do a breast self-exam Look for changes  Take off all the clothes above your waist. Stand in front of a mirror in a room with good lighting. Put your hands down at your sides. Compare your breasts in the mirror. Look for difference between them, such as: Differences in shape. Differences in size. Wrinkles, dips, and bumps in one breast and not the other. Look at each breast for skin changes, such as: Redness. Scaly spots. Spots where your skin is thicker. Dimpling. Open sores. Look for changes in your nipples, such as: Fluid coming out of a nipple. Fluid around a nipple. Bleeding. Dimpling. Redness. A nipple that looks pushed in or that has changed position. Feel for changes Lie on your back. Feel each breast. To do this: Pick a breast to feel. Place a pillow under the shoulder closest to that breast. Put the arm closest to that breast behind your head. Feel the breast using the hand of your other arm. Use the pads of your three middle fingers to make small circles starting near the nipple. Use light, medium, and firm pressure. Keep making circles, moving down over the breast. Stop when you feel your ribs. Start making circles with your fingers again, this time going up until you reach your collarbone. Then, make circles out across your breast and into your armpit area. Squeeze your nipple. Check for fluid and lumps. Do these steps again to check your other breast. Sit or stand in the tub or shower. With soapy water on your skin, feel each breast  the same way you did when you were lying down. Write down what you find Writing down what you find can help you keep track of what you want to tell your doctor. Write down: What's normal for each breast. Any changes you find. Write down: The kind of change. If your breast feels tender or painful. Any lump you find. Write down its size and where it is. When you last had your period. General tips If you're breastfeeding, the best time to check your breasts is after you feed your baby or after you use a breast pump. If you get a period, the best time to check your breasts is 5-7 days after your period ends. With time, you'll get more used to doing the self-exam. You'll also start to know if there are changes in your breasts. Contact a doctor if: You see a change in the shape or size of your breasts or nipples. You see a change in the skin of your breast or nipples. You have fluid coming from your nipples that isn't normal. You find a new lump or thick area. You have breast pain. You have any concerns about your breast health. This information is not intended to replace advice given to you by your health care provider. Make sure you discuss any questions you have with your health care provider. Document Revised: 02/08/2024 Document Reviewed: 02/08/2024 Elsevier Patient Education  2025 [employer].  Preventive Care 40-58 Years Old, Female Preventive care refers to lifestyle choices  and visits with your health care provider that can promote health and wellness. Preventive care visits are also called wellness exams. What can I expect for my preventive care visit? Counseling Your health care provider may ask you questions about your: Medical history, including: Past medical problems. Family medical history. Pregnancy history. Current health, including: Menstrual cycle. Method of birth control. Emotional well-being. Home life and relationship well-being. Sexual activity and sexual  health. Lifestyle, including: Alcohol, nicotine or tobacco, and drug use. Access to firearms. Diet, exercise, and sleep habits. Work and work Astronomer. Sunscreen use. Safety issues such as seatbelt and bike helmet use. Physical exam Your health care provider will check your: Height and weight. These may be used to calculate your BMI (body mass index). BMI is a measurement that tells if you are at a healthy weight. Waist circumference. This measures the distance around your waistline. This measurement also tells if you are at a healthy weight and may help predict your risk of certain diseases, such as type 2 diabetes and high blood pressure. Heart rate and blood pressure. Body temperature. Skin for abnormal spots. What immunizations do I need?  Vaccines are usually given at various ages, according to a schedule. Your health care provider will recommend vaccines for you based on your age, medical history, and lifestyle or other factors, such as travel or where you work. What tests do I need? Screening Your health care provider may recommend screening tests for certain conditions. This may include: Lipid and cholesterol levels. Diabetes screening. This is done by checking your blood sugar (glucose) after you have not eaten for a while (fasting). Pelvic exam and Pap test. Hepatitis B test. Hepatitis C test. HIV (human immunodeficiency virus) test. STI (sexually transmitted infection) testing, if you are at risk. Lung cancer screening. Colorectal cancer screening. Mammogram. Talk with your health care provider about when you should start having regular mammograms. This may depend on whether you have a family history of breast cancer. BRCA-related cancer screening. This may be done if you have a family history of breast, ovarian, tubal, or peritoneal cancers. Bone density scan. This is done to screen for osteoporosis. Talk with your health care provider about your test results,  treatment options, and if necessary, the need for more tests. Follow these instructions at home: Eating and drinking  Eat a diet that includes fresh fruits and vegetables, whole grains, lean protein, and low-fat dairy products. Take vitamin and mineral supplements as recommended by your health care provider. Do not drink alcohol if: Your health care provider tells you not to drink. You are pregnant, may be pregnant, or are planning to become pregnant. If you drink alcohol: Limit how much you have to 0-1 drink a day. Know how much alcohol is in your drink. In the [place] one drink equals one 12 oz bottle of beer (355 mL), one 5 oz glass of wine (148 mL), or one 1 oz glass of hard liquor (44 mL). Lifestyle Brush your teeth every morning and night with fluoride toothpaste. Floss one time each day. Exercise for at least 30 minutes 5 or more days each week. Do not use any products that contain nicotine or tobacco. These products include cigarettes, chewing tobacco, and vaping devices, such as e-cigarettes. If you need help quitting, ask your health care provider. Do not use drugs. If you are sexually active, practice safe sex. Use a condom or other form of protection to prevent STIs. If you do not wish to become pregnant, use  a form of birth control. If you plan to become pregnant, see your health care provider for a prepregnancy visit. Take aspirin only as told by your health care provider. Make sure that you understand how much to take and what form to take. Work with your health care provider to find out whether it is safe and beneficial for you to take aspirin daily. Find healthy ways to manage stress, such as: Meditation, yoga, or listening to music. Journaling. Talking to a trusted person. Spending time with friends and family. Minimize exposure to UV radiation to reduce your risk of skin cancer. Safety Always wear your seat belt while driving or riding in a vehicle. Do not drive: If you  have been drinking alcohol. Do not ride with someone who has been drinking. When you are tired or distracted. While texting. If you have been using any mind-altering substances or drugs. Wear a helmet and other protective equipment during sports activities. If you have firearms in your house, make sure you follow all gun safety procedures. Seek help if you have been physically or sexually abused. What's next? Visit your health care provider once a year for an annual wellness visit. Ask your health care provider how often you should have your eyes and teeth checked. Stay up to date on all vaccines. This information is not intended to replace advice given to you by your health care provider. Make sure you discuss any questions you have with your health care provider. Document Revised: 05/27/2021 Document Reviewed: 05/27/2021 Elsevier Patient Education  2024 [employer].

## 2024-09-11 ENCOUNTER — Inpatient Hospital Stay: Admitting: Oncology

## 2024-09-11 ENCOUNTER — Other Ambulatory Visit: Payer: Self-pay | Admitting: Oncology

## 2024-09-11 ENCOUNTER — Inpatient Hospital Stay: Attending: Oncology

## 2024-09-11 ENCOUNTER — Encounter: Payer: Self-pay | Admitting: Internal Medicine

## 2024-09-11 ENCOUNTER — Encounter: Payer: Self-pay | Admitting: Oncology

## 2024-09-11 ENCOUNTER — Other Ambulatory Visit: Payer: Self-pay

## 2024-09-11 DIAGNOSIS — C50912 Malignant neoplasm of unspecified site of left female breast: Secondary | ICD-10-CM | POA: Diagnosis present

## 2024-09-11 DIAGNOSIS — F909 Attention-deficit hyperactivity disorder, unspecified type: Secondary | ICD-10-CM

## 2024-09-11 DIAGNOSIS — Z87891 Personal history of nicotine dependence: Secondary | ICD-10-CM | POA: Diagnosis not present

## 2024-09-11 DIAGNOSIS — Z08 Encounter for follow-up examination after completed treatment for malignant neoplasm: Secondary | ICD-10-CM | POA: Diagnosis not present

## 2024-09-11 DIAGNOSIS — Z853 Personal history of malignant neoplasm of breast: Secondary | ICD-10-CM

## 2024-09-11 DIAGNOSIS — Z808 Family history of malignant neoplasm of other organs or systems: Secondary | ICD-10-CM | POA: Insufficient documentation

## 2024-09-11 DIAGNOSIS — Z171 Estrogen receptor negative status [ER-]: Secondary | ICD-10-CM | POA: Diagnosis not present

## 2024-09-11 DIAGNOSIS — Z803 Family history of malignant neoplasm of breast: Secondary | ICD-10-CM | POA: Diagnosis not present

## 2024-09-11 LAB — CMP (CANCER CENTER ONLY)
ALT: 18 U/L (ref 0–44)
AST: 14 U/L — ABNORMAL LOW (ref 15–41)
Albumin: 4.1 g/dL (ref 3.5–5.0)
Alkaline Phosphatase: 69 U/L (ref 38–126)
Anion gap: 6 (ref 5–15)
BUN: 22 mg/dL — ABNORMAL HIGH (ref 6–20)
CO2: 22 mmol/L (ref 22–32)
Calcium: 9.3 mg/dL (ref 8.9–10.3)
Chloride: 108 mmol/L (ref 98–111)
Creatinine: 0.83 mg/dL (ref 0.44–1.00)
GFR, Estimated: >60 mL/min (ref >60)
Glucose, Bld: 92 mg/dL (ref 70–99)
Potassium: 4.1 mmol/L (ref 3.5–5.1)
Sodium: 136 mmol/L (ref 135–145)
Total Bilirubin: 1 mg/dL (ref 0.0–1.2)
Total Protein: 7.4 g/dL (ref 6.5–8.1)

## 2024-09-11 LAB — CBC WITH DIFFERENTIAL (CANCER CENTER ONLY)
Abs Immature Granulocytes: 0.06 K/uL (ref 0.00–0.07)
Basophils Absolute: 0 K/uL (ref 0.0–0.1)
Basophils Relative: 0 %
Eosinophils Absolute: 0.1 K/uL (ref 0.0–0.5)
Eosinophils Relative: 1 %
HCT: 39 % (ref 36.0–46.0)
Hemoglobin: 13.4 g/dL (ref 12.0–15.0)
Immature Granulocytes: 1 %
Lymphocytes Relative: 31 %
Lymphs Abs: 2.2 K/uL (ref 0.7–4.0)
MCH: 28.5 pg (ref 26.0–34.0)
MCHC: 34.4 g/dL (ref 30.0–36.0)
MCV: 83 fL (ref 80.0–100.0)
Monocytes Absolute: 0.5 K/uL (ref 0.1–1.0)
Monocytes Relative: 7 %
Neutro Abs: 4.3 K/uL (ref 1.7–7.7)
Neutrophils Relative %: 60 %
Platelet Count: 332 K/uL (ref 150–400)
RBC: 4.7 MIL/uL (ref 3.87–5.11)
RDW: 13.2 % (ref 11.5–15.5)
WBC Count: 7.2 K/uL (ref 4.0–10.5)
nRBC: 0 % (ref 0.0–0.2)

## 2024-09-11 MED ORDER — AMPHETAMINE-DEXTROAMPHETAMINE 20 MG PO TABS: 20.0000 mg | ORAL_TABLET | Freq: Two times a day (BID) | ORAL | 0 refills | Status: DC

## 2024-09-11 NOTE — Progress Notes (Signed)
 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 There is a suspicious 24 mm mass at the site of palpable concern which is concerning for malignancy. It demonstrates 3 cm of associated calcifications. Recommend ultrasound-guided biopsy for definitive characterization with attention on post marker placement mammogram to assess for extent of calcifications in relation to the biopsy marking clip. 2. There is an indeterminate LEFT axillary lymph node. Recommend ultrasound-guided biopsy for definitive characterization. 3. No mammographic evidence of malignancy in the RIGHT breast.   02/10/2023 Pathology Results   DIAGNOSIS: A.  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  Size of invasive carcinoma: 1.7 cm in linear length in this sample Histologic grade of invasive carcinoma: Grade 3 Glandular/tubular differentiation score: 3 Nuclear pleomorphism score: 3 Mitotic rate score: 2 Total score: 8 Ductal carcinoma in situ: Present, high-grade Lymphovascular invasion: Indeterminate See comment.  B.  LYMPH NODE, LEFT AXILLA; STEREOTACTIC CORE BIOPSY: -  INVOLVED BY INVASIVE MAMMARY CARCINOMA, NO SPECIAL TYPE.   Estrogen Receptor (ER) Status: NEGATIVE (LESS THAN 1%)         Internal control cells positive Progesterone Receptor (PgR) Status: Negative HER2 (by immunohistochemistry): Positive Ki-67: Not performed    02/17/2023 Cancer Staging   Staging form: Breast, AJCC 8th Edition - Clinical: Stage IIB (cT2, cN1, cM0, G3, ER-, PR-, HER2+) - Signed by Agrawal, Kavita, MD on 02/17/2023 Histologic grading system: 3 grade system   03/07/2023 - 06/21/2023 Chemotherapy   Patient is on Treatment Plan : BREAST  Docetaxel  + Carboplatin  + Trastuzumab  + Pertuzumab   (TCHP) q21d       Genetic Testing   Negative genetic testing. No pathogenic variants identified on the [facility] Multi-Cancer+RNA panel. The report date is 03/01/2023.  The Multi-Cancer + RNA Panel offered by [facility] includes sequencing and/or deletion/duplication analysis of the following 70 genes:  AIP*, ALK, APC*, ATM*, AXIN2*, BAP1*, BARD1*, BLM*, BMPR1A*, BRCA1*, BRCA2*, BRIP1*, CDC73*, CDH1*, CDK4, CDKN1B*, CDKN2A, CHEK2*, CTNNA1*, DICER1*, EPCAM, EGFR, FH*, FLCN*, GREM1, HOXB13, KIT, LZTR1, MAX*, MBD4, MEN1*, MET, MITF, MLH1*, MSH2*, MSH3*, MSH6*, MUTYH*, NF1*, NF2*, NTHL1*, PALB2*, PDGFRA, PMS2*, POLD1*, POLE*, POT1*, PRKAR1A*, PTCH1*, PTEN*, RAD51C*, RAD51D*, RB1*, RET, SDHA*, SDHAF2*, SDHB*, SDHC*, SDHD*, SMAD4*, SMARCA4*, SMARCB1*, SMARCE1*, STK11*, SUFU*, TMEM127*, TP53*, TSC1*, TSC2*, VHL*. RNA analysis is performed for * genes.   08/30/2023 -  Chemotherapy   Patient is on Treatment Plan : BREAST Trastuzumab   + Pertuzumab  q21d x 13 cycles        Interval history-patient is doing well presently and denies any complaints at this time.  Denies any changes in her appetite and weight.  Denies any new aches and pains anywhere  ECOG PS- 0 Pain scale- 0   Review of systems- Review of  Systems  Constitutional:  Negative for chills, fever, malaise/fatigue and weight loss.  HENT:  Negative for  congestion, ear discharge and nosebleeds.   Eyes:  Negative for blurred vision.  Respiratory:  Negative for cough, hemoptysis, sputum production, shortness of breath and wheezing.   Cardiovascular:  Negative for chest pain, palpitations, orthopnea and claudication.  Gastrointestinal:  Negative for abdominal pain, blood in stool, constipation, diarrhea, heartburn, melena, nausea and vomiting.  Genitourinary:  Negative for dysuria, flank pain, frequency, hematuria and urgency.  Musculoskeletal:  Negative for back pain, joint pain and myalgias.  Skin:  Negative for rash.  Neurological:  Negative for dizziness, tingling, focal weakness, seizures, weakness and headaches.  Endo/Heme/Allergies:  Does not bruise/bleed easily.  Psychiatric/Behavioral:  Negative for depression and suicidal ideas. The patient does not have insomnia.       No Known Allergies   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    Family history of breast cancer 2015   IBIS=18.8%   Family history of ovarian cancer    Heart palpitations    Herpes genitalis    Hypomagnesemia    Leg swelling    Leukocytosis    Lichen sclerosus    Mixed hyperlipidemia    PAC (premature atrial contraction)    PONV (postoperative nausea and vomiting)    nausea   PVC's (premature ventricular contractions)    Tachycardia, unspecified      Past Surgical History:  Procedure Laterality Date   BREAST BIOPSY Left 02/10/2023   invasive mammary carcinoma   BREAST BIOPSY Left 02/10/2023   Us  Core Hydromark clip path pending   BREAST BIOPSY Left 02/10/2023   US  LT BREAST BX W  LOC DEV 1ST LESION IMG BX SPEC US  GUIDE 02/10/2023 [facility]   BREAST BIOPSY Left 06/30/2023   US  LT RADIO FREQUENCY TAG EA ADD LESION LOC US  GUIDE 06/30/2023 [facility]   BREAST LUMPECTOMY,RADIO FREQ LOCALIZER,AXILLARY SENTINEL LYMPH NODE BIOPSY Left 07/28/2023   Procedure: BREAST LUMPECTOMY,RADIO FREQ LOCALIZER,AXILLARY SENTINEL LYMPH NODE BIOPSY;  Surgeon: Tye Millet, DO;  Location: [facility];  Service: General;  Laterality: Left;   CESAREAN SECTION  01/03/2007   CESAREAN SECTION WITH BILATERAL TUBAL LIGATION N/A 03/26/2016   Procedure: CESAREAN SECTION WITH BILATERAL TUBAL LIGATION;  Surgeon: Garnette JONETTA Mace, MD;  Location: [facility];  Service: Obstetrics;  Laterality: N/A;   COLPOSCOPY     IUD REMOVAL     PORTACATH PLACEMENT N/A 02/24/2023   Procedure: INSERTION PORT-A-CATH;  Surgeon: Tye Millet, DO;  Location: [facility];  Service: General;  Laterality: N/A;    Social History   Socioeconomic History   Marital status: Married    Spouse name: Josefa   Number of children: 3   Years of education: Not on file   Highest education level: Not on file  Occupational History   Not on file  Tobacco Use   Smoking status: Former    Current packs/day: 0.00    Types: Cigarettes    Quit date: 2022    Years since quitting: 3.7   Smokeless tobacco: Never  Vaping Use   Vaping status: Never Used  Substance and Sexual Activity   Alcohol use: Yes    Comment: rarely   Drug use: No   Sexual activity: Yes    Birth control/protection: Surgical    Comment: tubal ligation  Other Topics Concern   Not on file  Social History Narrative   Not on file   Social Drivers of Health   Financial Resource Strain: Low Risk  (04/05/2023)   Overall Financial Resource Strain (CARDIA)    Difficulty of Paying Living Expenses: Not very hard  Food Insecurity: No Food Insecurity (04/05/2023)   Hunger Vital Sign    Worried About Running Out of Food in the Last Year: Never true    Ran Out of Food in  the Last Year: Never true  Transportation Needs: No Transportation Needs (02/17/2023)   PRAPARE - Administrator, Civil Service (Medical): No    Lack of Transportation (Non-Medical): No  Physical Activity: Inactive (04/05/2023)   Exercise Vital Sign    Days of Exercise per Week: 0 days    Minutes of Exercise per Session: 0 min  Stress: Stress Concern Present (04/05/2023)   [employer] of Occupational Health - Occupational Stress Questionnaire    Feeling of Stress : To some extent  Social Connections: Socially Integrated (04/05/2023)   Social Connection and Isolation Panel    Frequency of Communication with Friends and Family: Three times a week    Frequency of Social Gatherings with Friends and Family: Once a week    Attends Religious Services: 1 to 4 times per year    Active Member of [employer] or Organizations: Yes    Attends Banker Meetings: 1 to 4 times per year    Marital Status: Married  Catering manager Violence: Not At Risk (02/17/2023)   Humiliation, Afraid, Rape, and Kick questionnaire    Fear of Current or Ex-Partner: No    Emotionally Abused: No    Physically Abused: No    Sexually Abused: No    Family History  Problem Relation Age of Onset   Hypercholesterolemia Mother    Diabetes Mellitus II Father    Hypertension Father    Breast cancer Paternal Aunt 6   Breast cancer Paternal Aunt 40   Breast cancer Paternal Grandmother        58s   Throat cancer Paternal Grandfather    Heart attack Paternal Grandfather      Current Outpatient Medications:    amphetamine -dextroamphetamine  (ADDERALL) 20 MG tablet, Take 1 tablet (20 mg total) by mouth 2 (two) times daily., Disp: 60 tablet, Rfl: 0   clobetasol  ointment (TEMOVATE ) 0.05 %, Apply to affected area once wkly as maintenance, Disp: 45 g, Rfl: 0   escitalopram  (LEXAPRO ) 10 MG tablet, TAKE 1 TABLET BY MOUTH EVERY DAY, Disp: 90 tablet, Rfl: 1   metoprolol  succinate (TOPROL -XL) 25 MG 24 hr tablet,  TAKE 1 TABLET BY MOUTH ONCE A DAY **MAKE FOLLOW UP APPT WITH DR. KHAN FOR REFILLS**, Disp: 90 tablet, Rfl: 0 No current facility-administered medications for this visit.  Facility-Administered Medications Ordered in Other Visits:    heparin  lock flush 100 unit/mL, 500 Units, Intravenous, Once, Agrawal, Kavita, MD   heparin  lock flush 100 unit/mL, 500 Units, Intravenous, Once, Agrawal, Kavita, MD   heparin  lock flush 100 unit/mL, 500 Units, Intracatheter, Once PRN, Agrawal, Kavita, MD   sodium chloride  flush (NS) 0.9 % injection 10 mL, 10 mL, Intravenous, PRN, Agrawal, Kavita, MD, 10 mL  at 11/01/23 1021  Physical exam:  Vitals:   09/11/24 0933  BP: (!) 116/90  Pulse: (!) 104  Resp: 17  Temp: 97.6 F (36.4 C)  SpO2: 98%  Weight: 183 lb 9.6 oz (83.3 kg)   Physical Exam Cardiovascular:     Rate and Rhythm: Normal rate and regular rhythm.     Heart sounds: Normal heart sounds.  Pulmonary:     Effort: Pulmonary effort is normal.     Breath sounds: Normal breath sounds.  Abdominal:     General: Bowel sounds are normal.     Palpations: Abdomen is soft.  Skin:    General: Skin is warm and dry.  Neurological:     Mental Status: She is alert and oriented to person, place, and time.    Breast exam was performed in seated and lying down position. Patient is status post left lumpectomy with a well-healed surgical scar. No evidence of any palpable masses. No evidence of axillary adenopathy. No evidence of any palpable masses or lumps in the right breast. No evidence of right axillary adenopathy   I have personally reviewed labs listed below:    Latest Ref Rng & Units 09/11/2024    9:21 AM  CMP  Glucose 70 - 99 mg/dL 92   BUN 6 - 20 mg/dL 22   Creatinine 9.55 - 1.00 mg/dL 9.16   Sodium 864 - 854 mmol/L 136   Potassium 3.5 - 5.1 mmol/L 4.1   Chloride 98 - 111 mmol/L 108   CO2 22 - 32 mmol/L 22   Calcium 8.9 - 10.3 mg/dL 9.3   Total Protein 6.5 - 8.1 g/dL 7.4   Total Bilirubin 0.0 -  1.2 mg/dL 1.0   Alkaline Phos 38 - 126 U/L 69   AST 15 - 41 U/L 14   ALT 0 - 44 U/L 18       Latest Ref Rng & Units 09/11/2024    9:21 AM  CBC  WBC 4.0 - 10.5 K/uL 7.2   Hemoglobin 12.0 - 15.0 g/dL 86.5   Hematocrit 63.9 - 46.0 % 39.0   Platelets 150 - 400 K/uL 332      Assessment and plan- Patient is a 38 y.o. 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  Patient completed adjuvant radiation and 1 year of Herceptin  and Perjeta .  This is a routine follow-up visit  Clinically patient is doing well with no concerning signs and symptoms of recurrence based on today's exam.  She will be due for mammogram in March 2025 which I will schedule.  She has ER/PR negative disease and therefore she is not on endocrine therapy.  I will see her back in 6 months no labs   Visit Diagnosis 1. Encounter for follow-up surveillance of breast cancer      Dr. Annah Skene, MD, MPH [facility] [phone] 09/11/2024 12:42 PM

## 2024-09-11 NOTE — Progress Notes (Signed)
 Survivorship Care Plan visit completed.  Treatment summary reviewed and given to patient.  ASCO answers booklet reviewed and given to patient.  CARE program and Cancer Transitions discussed with patient along with other resources cancer center offers to patients and caregivers.  Patient verbalized understanding.

## 2024-09-12 ENCOUNTER — Other Ambulatory Visit: Payer: Self-pay

## 2024-09-12 ENCOUNTER — Encounter: Payer: Self-pay | Admitting: Internal Medicine

## 2024-09-12 ENCOUNTER — Ambulatory Visit (INDEPENDENT_AMBULATORY_CARE_PROVIDER_SITE_OTHER): Admitting: Internal Medicine

## 2024-09-12 VITALS — BP 104/78 | HR 85 | Temp 98.1°F | Ht 66.0 in | Wt 184.2 lb

## 2024-09-12 DIAGNOSIS — F419 Anxiety disorder, unspecified: Secondary | ICD-10-CM

## 2024-09-12 DIAGNOSIS — E782 Mixed hyperlipidemia: Secondary | ICD-10-CM | POA: Diagnosis not present

## 2024-09-12 DIAGNOSIS — F909 Attention-deficit hyperactivity disorder, unspecified type: Secondary | ICD-10-CM

## 2024-09-12 MED ORDER — AMPHETAMINE-DEXTROAMPHETAMINE 20 MG PO TABS: 20.0000 mg | ORAL_TABLET | Freq: Two times a day (BID) | ORAL | 0 refills | Status: DC

## 2024-09-12 MED ORDER — ESCITALOPRAM OXALATE 10 MG PO TABS: 10.0000 mg | ORAL_TABLET | Freq: Every day | ORAL | 1 refills | Status: AC

## 2024-09-12 NOTE — Progress Notes (Signed)
 Established Patient Office Visit  Subjective:  Patient ID: Cristina Baker, female    DOB: February 02, 1986  Age: 38 y.o. MRN: [id number]  Chief Complaint  Patient presents with   Follow-up    3 month follow up    No new complaints, here for  medication refills. H/o breast Ca in remission and does not require hormone Rx. Anxiety controlled on current dose of Lexapro .     No other concerns at this time.   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    Family history of breast cancer 2015   IBIS=18.8%   Family history of ovarian cancer    Heart palpitations    Herpes genitalis    Hypomagnesemia    Leg swelling    Leukocytosis    Lichen sclerosus    Mixed hyperlipidemia    PAC (premature atrial contraction)    PONV (postoperative nausea and vomiting)    nausea   PVC'Cristela Stalder (premature ventricular contractions)    Tachycardia, unspecified     Past Surgical History:  Procedure Laterality Date   BREAST BIOPSY Left 02/10/2023   invasive mammary carcinoma   BREAST BIOPSY Left 02/10/2023   Us  Core Hydromark clip path pending   BREAST BIOPSY Left 02/10/2023   US  LT BREAST BX W LOC DEV 1ST LESION IMG BX SPEC US  GUIDE 02/10/2023 [facility]   BREAST BIOPSY Left 06/30/2023   US  LT RADIO FREQUENCY TAG EA ADD LESION LOC US  GUIDE 06/30/2023 [facility]   BREAST LUMPECTOMY,RADIO FREQ LOCALIZER,AXILLARY SENTINEL LYMPH NODE BIOPSY Left 07/28/2023   Procedure: BREAST LUMPECTOMY,RADIO FREQ LOCALIZER,AXILLARY SENTINEL LYMPH NODE BIOPSY;  Surgeon: Tye Millet, DO;  Location: [facility];  Service: General;  Laterality: Left;    CESAREAN SECTION  01/03/2007   CESAREAN SECTION WITH BILATERAL TUBAL LIGATION N/A 03/26/2016   Procedure: CESAREAN SECTION WITH BILATERAL TUBAL LIGATION;  Surgeon: Garnette JONETTA Mace, MD;  Location: [facility];  Service: Obstetrics;  Laterality: N/A;   COLPOSCOPY     IUD REMOVAL     PORTACATH PLACEMENT N/A 02/24/2023   Procedure: INSERTION PORT-A-CATH;  Surgeon: Tye Millet, DO;  Location: [facility];  Service: General;  Laterality: N/A;    Social History   Socioeconomic History   Marital status: Married    Spouse name: Josefa   Number of children: 3   Years of education: Not on file   Highest education level: Not on file  Occupational History   Not on file  Tobacco Use   Smoking status: Former    Current packs/day: 0.00    Types: Cigarettes    Quit date: 2022    Years since quitting: 3.7   Smokeless tobacco: Never  Vaping Use   Vaping status: Never Used  Substance and Sexual Activity   Alcohol use: Yes    Comment: rarely   Drug use: No   Sexual activity: Yes    Birth control/protection: Surgical    Comment: tubal ligation  Other Topics Concern   Not on file  Social History Narrative   Not on file   Social  Drivers of Health   Financial Resource Strain: Low Risk  (04/05/2023)   Overall Financial Resource Strain (CARDIA)    Difficulty of Paying Living Expenses: Not very hard  Food Insecurity: No Food Insecurity (04/05/2023)   Hunger Vital Sign    Worried About Running Out of Food in the Last Year: Never true    Ran Out of Food in the Last Year: Never true  Transportation Needs: No Transportation Needs (02/17/2023)   PRAPARE - Administrator, Civil Service (Medical): No    Lack of Transportation (Non-Medical): No  Physical Activity: Inactive (04/05/2023)   Exercise Vital Sign    Days of Exercise per Week: 0 days    Minutes of Exercise per Session: 0 min  Stress: Stress Concern Present (04/05/2023)   [employer] of Occupational Health - Occupational Stress  Questionnaire    Feeling of Stress : To some extent  Social Connections: Socially Integrated (04/05/2023)   Social Connection and Isolation Panel    Frequency of Communication with Friends and Family: Three times a week    Frequency of Social Gatherings with Friends and Family: Once a week    Attends Religious Services: 1 to 4 times per year    Active Member of [employer] or Organizations: Yes    Attends Banker Meetings: 1 to 4 times per year    Marital Status: Married  Catering manager Violence: Not At Risk (02/17/2023)   Humiliation, Afraid, Rape, and Kick questionnaire    Fear of Current or Ex-Partner: No    Emotionally Abused: No    Physically Abused: No    Sexually Abused: No    Family History  Problem Relation Age of Onset   Hypercholesterolemia Mother    Diabetes Mellitus II Father    Hypertension Father    Breast cancer Paternal Aunt 6   Breast cancer Paternal Aunt 30   Breast cancer Paternal Grandmother        60s   Throat cancer Paternal Grandfather    Heart attack Paternal Grandfather     No Known Allergies  Outpatient Medications Prior to Visit  Medication Sig   amphetamine -dextroamphetamine  (ADDERALL) 20 MG tablet Take 1 tablet (20 mg total) by mouth 2 (two) times daily.   clobetasol  ointment (TEMOVATE ) 0.05 % Apply to affected area once wkly as maintenance   metoprolol  succinate (TOPROL -XL) 25 MG 24 hr tablet TAKE 1 TABLET BY MOUTH ONCE A DAY **MAKE FOLLOW UP APPT WITH DR. KHAN FOR REFILLS**   [DISCONTINUED] escitalopram  (LEXAPRO ) 10 MG tablet TAKE 1 TABLET BY MOUTH EVERY DAY   Facility-Administered Medications Prior to Visit  Medication Dose Route Frequency Provider   heparin  lock flush 100 unit/mL  500 Units Intravenous Once Agrawal, Kavita, MD   heparin  lock flush 100 unit/mL  500 Units Intravenous Once Agrawal, Kavita, MD   heparin  lock flush 100 unit/mL  500 Units Intracatheter Once PRN Agrawal, Kavita, MD   sodium chloride  flush (NS) 0.9 %  injection 10 mL  10 mL Intravenous PRN Agrawal, Kavita, MD    Review of Systems  Constitutional: Negative.  Negative for weight loss.  HENT: Negative.    Eyes: Negative.   Respiratory: Negative.    Cardiovascular: Negative.   Gastrointestinal: Negative.   Genitourinary: Negative.   Skin: Negative.   Neurological: Negative.   Endo/Heme/Allergies: Negative.        Objective:   BP 104/78   Pulse 85   Temp 98.1 F (36.7 C) (Tympanic)   Ht  5' 6 (1.676 m)   Wt 184 lb 3.2 oz (83.6 kg)   LMP 08/04/2024 (Approximate)   SpO2 98%   BMI 29.73 kg/m   Vitals:   09/12/24 1127  BP: 104/78  Pulse: 85  Temp: 98.1 F (36.7 C)  Height: 5' 6 (1.676 m)  Weight: 184 lb 3.2 oz (83.6 kg)  SpO2: 98%  TempSrc: Tympanic  BMI (Calculated): 29.75    Physical Exam Vitals reviewed.  Constitutional:      General: She is not in acute distress. HENT:     Head: Normocephalic.     Nose: Nose normal.     Mouth/Throat:     Mouth: Mucous membranes are moist.  Eyes:     Extraocular Movements: Extraocular movements intact.     Pupils: Pupils are equal, round, and reactive to light.  Cardiovascular:     Rate and Rhythm: Normal rate and regular rhythm.     Heart sounds: No murmur heard. Pulmonary:     Effort: Pulmonary effort is normal.     Breath sounds: No rhonchi or rales.  Abdominal:     General: Abdomen is flat.     Palpations: There is no hepatomegaly, splenomegaly or mass.  Musculoskeletal:        General: Normal range of motion.     Cervical back: Normal range of motion. No tenderness.  Skin:    General: Skin is warm and dry.  Neurological:     General: No focal deficit present.     Mental Status: She is alert and oriented to person, place, and time.     Cranial Nerves: No cranial nerve deficit.     Motor: No weakness.  Psychiatric:        Mood and Affect: Mood normal.        Behavior: Behavior normal.      No results found for any visits on 09/12/24.       Assessment & Plan:  Cristina Baker was seen today for follow-up.  Anxiety -     Escitalopram  Oxalate; Take 1 tablet (10 mg total) by mouth daily.  Dispense: 90 tablet; Refill: 1  Adult ADHD (attention deficit hyperactivity disorder) -     Amphetamine -Dextroamphetamine ; Take 1 tablet (20 mg total) by mouth 2 (two) times daily.  Dispense: 60 tablet; Refill: 0 -     Amphetamine -Dextroamphetamine ; Take 1 tablet (20 mg total) by mouth 2 (two) times daily.  Dispense: 60 tablet; Refill: 0  Mixed hyperlipidemia -     Comprehensive metabolic panel with GFR -     Lipid panel    Problem List Items Addressed This Visit       Other   Adult ADHD (attention deficit hyperactivity disorder)   Relevant Medications   amphetamine -dextroamphetamine  (ADDERALL) 20 MG tablet (Start on 10/13/2024)   amphetamine -dextroamphetamine  (ADDERALL) 20 MG tablet (Start on 11/13/2024)   Mixed hyperlipidemia   Relevant Orders   Comprehensive metabolic panel with GFR   Lipid panel   Anxiety - Primary   Relevant Medications   escitalopram  (LEXAPRO ) 10 MG tablet    Return in about 3 months (around 12/13/2024) for fu with labs prior.   Total time spent: 20 minutes  Sherrill Cinderella Perry, MD  09/12/2024   This document may have been prepared by [facility] Voice Recognition software and as such may include unintentional dictation errors.

## 2024-10-10 ENCOUNTER — Encounter: Payer: Self-pay | Admitting: Internal Medicine

## 2024-10-10 ENCOUNTER — Other Ambulatory Visit: Payer: Self-pay | Admitting: Internal Medicine

## 2024-10-10 DIAGNOSIS — F909 Attention-deficit hyperactivity disorder, unspecified type: Secondary | ICD-10-CM

## 2024-10-10 MED ORDER — AMPHETAMINE-DEXTROAMPHETAMINE 20 MG PO TABS: 20.0000 mg | ORAL_TABLET | Freq: Two times a day (BID) | ORAL | 0 refills | Status: DC

## 2024-11-07 ENCOUNTER — Other Ambulatory Visit: Payer: Self-pay | Admitting: Internal Medicine

## 2024-11-07 DIAGNOSIS — F909 Attention-deficit hyperactivity disorder, unspecified type: Secondary | ICD-10-CM

## 2024-11-07 MED ORDER — AMPHETAMINE-DEXTROAMPHETAMINE 20 MG PO TABS: 20.0000 mg | ORAL_TABLET | Freq: Two times a day (BID) | ORAL | 0 refills | Status: DC

## 2024-11-22 ENCOUNTER — Ambulatory Visit: Admitting: Radiation Oncology

## 2024-12-19 ENCOUNTER — Ambulatory Visit: Admitting: Internal Medicine

## 2024-12-19 ENCOUNTER — Encounter: Payer: Self-pay | Admitting: Internal Medicine

## 2024-12-25 ENCOUNTER — Other Ambulatory Visit

## 2024-12-26 ENCOUNTER — Encounter: Payer: Self-pay | Admitting: Radiation Oncology

## 2024-12-26 ENCOUNTER — Encounter: Payer: Self-pay | Admitting: Oncology

## 2024-12-26 ENCOUNTER — Ambulatory Visit
Admission: RE | Admit: 2024-12-26 | Discharge: 2024-12-26 | Disposition: A | Discharge status: Home / Self Care | Source: Ambulatory Visit | Attending: Radiation Oncology | Admitting: Radiation Oncology

## 2024-12-26 VITALS — BP 136/84 | HR 89 | Resp 14 | Ht 66.0 in

## 2024-12-26 DIAGNOSIS — R918 Other nonspecific abnormal finding of lung field: Secondary | ICD-10-CM | POA: Diagnosis not present

## 2024-12-26 DIAGNOSIS — Z923 Personal history of irradiation: Secondary | ICD-10-CM | POA: Insufficient documentation

## 2024-12-26 DIAGNOSIS — C50412 Malignant neoplasm of upper-outer quadrant of left female breast: Secondary | ICD-10-CM

## 2024-12-26 DIAGNOSIS — Z853 Personal history of malignant neoplasm of breast: Secondary | ICD-10-CM | POA: Insufficient documentation

## 2024-12-26 DIAGNOSIS — Z9221 Personal history of antineoplastic chemotherapy: Secondary | ICD-10-CM | POA: Insufficient documentation

## 2024-12-26 LAB — LIPID PANEL
Chol/HDL Ratio: 4 ratio (ref 0.0–4.4)
Cholesterol, Total: 196 mg/dL (ref 100–199)
HDL: 49 mg/dL
LDL Chol Calc (NIH): 129 mg/dL — ABNORMAL HIGH (ref 0–99)
Triglycerides: 98 mg/dL (ref 0–149)
VLDL Cholesterol Cal: 18 mg/dL (ref 5–40)

## 2024-12-26 LAB — COMPREHENSIVE METABOLIC PANEL WITH GFR
ALT: 13 IU/L (ref 0–32)
AST: 12 IU/L (ref 0–40)
Albumin: 4.2 g/dL (ref 3.9–4.9)
Alkaline Phosphatase: 79 IU/L (ref 41–116)
BUN/Creatinine Ratio: 25 — ABNORMAL HIGH (ref 9–23)
BUN: 17 mg/dL (ref 6–24)
Bilirubin Total: 0.5 mg/dL (ref 0.0–1.2)
CO2: 22 mmol/L (ref 20–29)
Calcium: 9.5 mg/dL (ref 8.7–10.2)
Chloride: 105 mmol/L (ref 96–106)
Creatinine, Ser: 0.67 mg/dL (ref 0.57–1.00)
Globulin, Total: 2.1 g/dL (ref 1.5–4.5)
Glucose: 82 mg/dL (ref 70–99)
Potassium: 4.1 mmol/L (ref 3.5–5.2)
Sodium: 141 mmol/L (ref 134–144)
Total Protein: 6.3 g/dL (ref 6.0–8.5)
eGFR: 113 mL/min/1.73

## 2024-12-26 NOTE — Progress Notes (Signed)
 Radiation Oncology Follow up Note  Name: Cristina Baker   Date:   12/26/2024 MRN:  [id number] DOB: 30-Sep-1986    This 39 y.o. 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  REFERRING PROVIDER: Albina GORMAN Dine, MD  HPI: Patient is a 39 year old female now out 13 months having completed whole breast radiation to her left breast for stage IIb ER PR negative HER2/neu overexpressed invasive mammary carcinoma with a complete response after neoadjuvant chemotherapy at time of wide local excision.  Seen today in routine follow-up she is doing well.  She specifically denies breast tenderness cough or bone pain.  Based on her ER negative nature of her disease she is not on endocrine therapy..  She had breast imaging back in March which I have reviewed shows BI-RADS Category 2 benign findings.  She also had CT scan of her chest back in May 2025 again showing resolving radiation changes involving the anterior aspect of the left lung and a stable 2 mm right middle lobe nodule likely benign.  No mediastinal hilar adenopathy was noted.  Patient has completed 1 year of Herceptin  and Perjeta .  COMPLICATIONS OF TREATMENT: none  FOLLOW UP COMPLIANCE: keeps appointments   PHYSICAL EXAM:  BP 136/84   Pulse 89   Resp 14   Ht 5' 6 (1.676 m)   BMI 29.73 kg/m  Lungs are clear to A&P cardiac examination essentially unremarkable with regular rate and rhythm. No dominant mass or nodularity is noted in either breast in 2 positions examined. Incision is well-healed. No axillary or supraclavicular adenopathy is appreciated. Cosmetic result is excellent.  Well-developed well-nourished patient in NAD. HEENT reveals PERLA, EOMI, discs not  visualized.  Oral cavity is clear. No oral mucosal lesions are identified. Neck is clear without evidence of cervical or supraclavicular adenopathy. Lungs are clear to A&P. Cardiac examination is essentially unremarkable with regular rate and rhythm without murmur rub or thrill. Abdomen is benign with no organomegaly or masses noted. Motor sensory and DTR levels are equal and symmetric in the upper and lower extremities. Cranial nerves II through XII are grossly intact. Proprioception is intact. No peripheral adenopathy or edema is identified. No motor or sensory levels are noted. Crude visual fields are within normal range.  RADIOLOGY RESULTS: Mammograms and CT scan of the chest reviewed compatible with above-stated findings  PLAN: Present time patient is now out 13 months with no evidence of disease.  I am pleased with her overall progress.  Of asked to see her back in 1 year for follow-up.  Patient knows to call sooner with any concerns she continues follow-up care with medical oncology.  I would like to take this opportunity to thank you for allowing me to participate in the care of your patient.SABRA Marcey Penton, MD

## 2024-12-27 ENCOUNTER — Other Ambulatory Visit: Payer: Self-pay

## 2024-12-28 ENCOUNTER — Ambulatory Visit: Admitting: Internal Medicine

## 2024-12-28 VITALS — BP 106/84 | HR 98 | Ht 66.0 in | Wt 185.4 lb

## 2024-12-28 DIAGNOSIS — E669 Obesity, unspecified: Secondary | ICD-10-CM | POA: Diagnosis not present

## 2024-12-28 DIAGNOSIS — F909 Attention-deficit hyperactivity disorder, unspecified type: Secondary | ICD-10-CM

## 2024-12-28 DIAGNOSIS — E782 Mixed hyperlipidemia: Secondary | ICD-10-CM | POA: Diagnosis not present

## 2024-12-28 DIAGNOSIS — Z0130 Encounter for examination of blood pressure without abnormal findings: Secondary | ICD-10-CM

## 2024-12-28 NOTE — Progress Notes (Signed)
 "  Established Patient Office Visit  Subjective:  Patient ID: Cristina Baker, female    DOB: November 22, 1986  Age: 39 y.o. MRN: [id number]  Chief Complaint  Patient presents with   Follow-up    4 month follow up    No new complaints, here for lab review and medication refills.Labs reviewed and notable for improvement in LDL by 30+ points and normal CMP. She is now participating in a lifestyle program sponsored by her cancer center.     No other concerns at this time.   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    Family history of breast cancer 2015   IBIS=18.8%   Family history of ovarian cancer    Heart palpitations    Herpes genitalis    Hypomagnesemia    Leg swelling    Leukocytosis    Lichen sclerosus    Mixed hyperlipidemia    PAC (premature atrial contraction)    PONV (postoperative nausea and vomiting)    nausea   PVC'Cristina Baker (premature ventricular contractions)    Tachycardia, unspecified     Past Surgical History:  Procedure Laterality Date   BREAST BIOPSY Left 02/10/2023   invasive mammary carcinoma   BREAST BIOPSY Left 02/10/2023   Us  Core Hydromark clip path pending   BREAST BIOPSY Left 02/10/2023   US  LT BREAST BX W LOC DEV 1ST LESION IMG BX SPEC US  GUIDE 02/10/2023 [facility]   BREAST BIOPSY Left 06/30/2023   US  LT RADIO FREQUENCY TAG EA ADD LESION LOC US  GUIDE 06/30/2023 [facility]   BREAST LUMPECTOMY,RADIO FREQ LOCALIZER,AXILLARY SENTINEL LYMPH NODE BIOPSY Left 07/28/2023   Procedure: BREAST LUMPECTOMY,RADIO FREQ LOCALIZER,AXILLARY SENTINEL LYMPH NODE BIOPSY;  Surgeon: Tye Millet, DO;   Location: [facility];  Service: General;  Laterality: Left;   CESAREAN SECTION  01/03/2007   CESAREAN SECTION WITH BILATERAL TUBAL LIGATION N/A 03/26/2016   Procedure: CESAREAN SECTION WITH BILATERAL TUBAL LIGATION;  Surgeon: Garnette JONETTA Mace, MD;  Location: [facility];  Service: Obstetrics;  Laterality: N/A;   COLPOSCOPY     IUD REMOVAL     PORTACATH PLACEMENT N/A 02/24/2023   Procedure: INSERTION PORT-A-CATH;  Surgeon: Tye Millet, DO;  Location: [facility];  Service: General;  Laterality: N/A;    Social History   Socioeconomic History   Marital status: Married    Spouse name: Josefa   Number of children: 3   Years of education: Not on file   Highest education level: Not on file  Occupational History   Not on file  Tobacco Use   Smoking status: Former    Current packs/day: 0.00    Types: Cigarettes    Quit date: 2022    Years since quitting: 4.0   Smokeless tobacco: Never  Vaping Use   Vaping status: Never Used  Substance and Sexual Activity   Alcohol use: Yes    Comment: rarely   Drug use: No   Sexual activity: Yes    Birth control/protection: Surgical    Comment: tubal ligation  Other Topics Concern   Not on file  Social History Narrative   Not on file   Social Drivers of Health   Tobacco Use: Medium Risk (12/26/2024)   Patient History    Smoking Tobacco Use: Former    Smokeless Tobacco Use: Never    Passive Exposure: Not on file  Financial Resource Strain: Low Risk (04/05/2023)   Overall Financial Resource Strain (CARDIA)    Difficulty of Paying Living Expenses: Not very hard  Food Insecurity: No Food Insecurity (04/05/2023)   Hunger Vital Sign    Worried About Running Out of Food in the Last Year: Never true    Ran Out of Food in the Last Year: Never true  Transportation Needs: No Transportation Needs (02/17/2023)   PRAPARE - Administrator, Civil Service (Medical): No    Lack of Transportation (Non-Medical): No  Physical Activity: Inactive  (04/05/2023)   Exercise Vital Sign    Days of Exercise per Week: 0 days    Minutes of Exercise per Session: 0 min  Stress: Stress Concern Present (04/05/2023)   [employer] of Occupational Health - Occupational Stress Questionnaire    Feeling of Stress : To some extent  Social Connections: Socially Integrated (04/05/2023)   Social Connection and Isolation Panel    Frequency of Communication with Friends and Family: Three times a week    Frequency of Social Gatherings with Friends and Family: Once a week    Attends Religious Services: 1 to 4 times per year    Active Member of [employer] or Organizations: Yes    Attends Banker Meetings: 1 to 4 times per year    Marital Status: Married  Catering Manager Violence: Not At Risk (02/17/2023)   Humiliation, Afraid, Rape, and Kick questionnaire    Fear of Current or Ex-Partner: No    Emotionally Abused: No    Physically Abused: No    Sexually Abused: No  Depression (PHQ2-9): Low Risk (12/26/2024)   Depression (PHQ2-9)    PHQ-2 Score: 0  Alcohol Screen: Not on file  Housing: Unknown (01/06/2024)   Received from [facility]   Epic    Unable to Pay for Housing in the Last Year: Not on file    Number of Times Moved in the Last Year: Not on file    At any time in the past 12 months, were you homeless or living in a shelter (including now)?: No  Utilities: Not At Risk (02/17/2023)   [facility] Utilities    Threatened with loss of utilities: No  Health Literacy: Not on file    Family History  Problem Relation Age of Onset   Hypercholesterolemia Mother    Diabetes Mellitus II Father    Hypertension Father    Breast cancer Paternal Aunt 61   Breast cancer Paternal Aunt 73   Breast cancer Paternal Grandmother        50s   Throat cancer Paternal Grandfather    Heart attack Paternal Grandfather     Allergies[1]  Show/hide medication list[2]  Review of Systems  Constitutional: Negative.  Negative for weight loss.   HENT: Negative.    Eyes: Negative.   Respiratory: Negative.    Cardiovascular: Negative.   Gastrointestinal: Negative.   Genitourinary: Negative.   Skin: Negative.   Neurological: Negative.   Endo/Heme/Allergies: Negative.        Objective:   BP 106/84   Pulse 98   Ht 5' 6 (1.676 m)   Wt 185 lb 6.4 oz (84.1 kg)   SpO2  98%   BMI 29.92 kg/m   Vitals:   12/28/24 1135  BP: 106/84  Pulse: 98  Height: 5' 6 (1.676 m)  Weight: 185 lb 6.4 oz (84.1 kg)  SpO2: 98%  BMI (Calculated): 29.94    Physical Exam Vitals reviewed.  Constitutional:      General: She is not in acute distress. HENT:     Head: Normocephalic.     Nose: Nose normal.     Mouth/Throat:     Mouth: Mucous membranes are moist.  Eyes:     Extraocular Movements: Extraocular movements intact.     Pupils: Pupils are equal, round, and reactive to light.  Cardiovascular:     Rate and Rhythm: Normal rate and regular rhythm.     Heart sounds: No murmur heard. Pulmonary:     Effort: Pulmonary effort is normal.     Breath sounds: No rhonchi or rales.  Abdominal:     General: Abdomen is flat.     Palpations: There is no hepatomegaly, splenomegaly or mass.  Musculoskeletal:        General: Normal range of motion.     Cervical back: Normal range of motion. No tenderness.  Skin:    General: Skin is warm and dry.  Neurological:     General: No focal deficit present.     Mental Status: She is alert and oriented to person, place, and time.     Cranial Nerves: No cranial nerve deficit.     Motor: No weakness.  Psychiatric:        Mood and Affect: Mood normal.        Behavior: Behavior normal.      No results found for any visits on 12/28/24.  Recent Results (from the past 2160 hours)  Comprehensive metabolic panel with GFR     Status: Abnormal   Collection Time: 12/25/24  8:32 AM  Result Value Ref Range   Glucose 82 70 - 99 mg/dL   BUN 17 6 - 24 mg/dL   Creatinine, Ser 9.32 0.57 - 1.00 mg/dL   eGFR  886 >40 fO/fpw/8.26   BUN/Creatinine Ratio 25 (H) 9 - 23   Sodium 141 134 - 144 mmol/L   Potassium 4.1 3.5 - 5.2 mmol/L   Chloride 105 96 - 106 mmol/L   CO2 22 20 - 29 mmol/L   Calcium 9.5 8.7 - 10.2 mg/dL   Total Protein 6.3 6.0 - 8.5 g/dL   Albumin 4.2 3.9 - 4.9 g/dL   Globulin, Total 2.1 1.5 - 4.5 g/dL   Bilirubin Total 0.5 0.0 - 1.2 mg/dL   Alkaline Phosphatase 79 41 - 116 IU/L   AST 12 0 - 40 IU/L   ALT 13 0 - 32 IU/L  Lipid panel     Status: Abnormal   Collection Time: 12/25/24  8:32 AM  Result Value Ref Range   Cholesterol, Total 196 100 - 199 mg/dL   Triglycerides 98 0 - 149 mg/dL   HDL 49 >60 mg/dL   VLDL Cholesterol Cal 18 5 - 40 mg/dL   LDL Chol Calc (NIH) 870 (H) 0 - 99 mg/dL   Chol/HDL Ratio 4.0 0.0 - 4.4 ratio    Comment:                                   T. Chol/HDL Ratio  Men  Women                               1/2 Avg.Risk  3.4    3.3                                   Avg.Risk  5.0    4.4                                2X Avg.Risk  9.6    7.1                                3X Avg.Risk 23.4   11.0       Assessment & Plan:  Landis was seen today for follow-up.  Obesity (BMI 30-39.9)  Mixed hyperlipidemia  Adult ADHD (attention deficit hyperactivity disorder)   Stricter low calorie diet, low cholesterol and low fat diet and exercise as much as possible  Problem List Items Addressed This Visit       Other   Adult ADHD (attention deficit hyperactivity disorder)   Mixed hyperlipidemia   Obesity (BMI 30-39.9) - Primary    Return in about 3 months (around 03/28/2025) for fu with labs prior.   Total time spent: 20 minutes. This time includes review of previous notes and results and patient face to face interaction during today'Hemi Chacko visit.    Sherrill Cinderella Perry, MD  12/28/2024   This document may have been prepared by [facility] Voice Recognition software and as such may include unintentional dictation  errors.     [1] No Known Allergies [2]  Outpatient Medications Prior to Visit  Medication Sig   amphetamine -dextroamphetamine  (ADDERALL) 20 MG tablet Take 1 tablet (20 mg total) by mouth 2 (two) times daily.   amphetamine -dextroamphetamine  (ADDERALL) 20 MG tablet Take 1 tablet (20 mg total) by mouth 2 (two) times daily.   amphetamine -dextroamphetamine  (ADDERALL) 20 MG tablet Take 1 tablet (20 mg total) by mouth 2 (two) times daily.   clobetasol  ointment (TEMOVATE ) 0.05 % Apply to affected area once wkly as maintenance   escitalopram  (LEXAPRO ) 10 MG tablet Take 1 tablet (10 mg total) by mouth daily.   metoprolol  succinate (TOPROL -XL) 25 MG 24 hr tablet TAKE 1 TABLET BY MOUTH ONCE A DAY **MAKE FOLLOW UP APPT WITH DR. KHAN FOR REFILLS**   Facility-Administered Medications Prior to Visit  Medication Dose Route Frequency Provider   heparin  lock flush 100 unit/mL  500 Units Intravenous Once Agrawal, Kavita, MD   heparin  lock flush 100 unit/mL  500 Units Intravenous Once Agrawal, Kavita, MD   heparin  lock flush 100 unit/mL  500 Units Intracatheter Once PRN Agrawal, Kavita, MD   sodium chloride  flush (NS) 0.9 % injection 10 mL  10 mL Intravenous PRN Agrawal, Kavita, MD   "

## 2025-01-09 ENCOUNTER — Encounter: Payer: Self-pay | Admitting: Internal Medicine

## 2025-01-09 DIAGNOSIS — F909 Attention-deficit hyperactivity disorder, unspecified type: Secondary | ICD-10-CM

## 2025-01-11 ENCOUNTER — Other Ambulatory Visit: Payer: Self-pay | Admitting: Internal Medicine

## 2025-01-11 DIAGNOSIS — F909 Attention-deficit hyperactivity disorder, unspecified type: Secondary | ICD-10-CM

## 2025-01-11 MED ORDER — AMPHETAMINE-DEXTROAMPHETAMINE 20 MG PO TABS: 20.0000 mg | ORAL_TABLET | Freq: Two times a day (BID) | ORAL | 0 refills | Status: AC

## 2025-01-18 ENCOUNTER — Encounter: Payer: Self-pay | Admitting: Oncology

## 2025-03-11 ENCOUNTER — Ambulatory Visit: Admitting: Oncology

## 2025-03-29 ENCOUNTER — Ambulatory Visit: Admitting: Internal Medicine

## 2025-12-26 ENCOUNTER — Ambulatory Visit: Admitting: Radiation Oncology
